# Patient Record
Sex: Female | Born: 1945 | Race: Black or African American | Hispanic: No | Smoking: Never smoker
Health system: Southern US, Community
[De-identification: ages and names within clinical notes are randomized; demographics above are authoritative.]

## PROBLEM LIST (undated history)

## (undated) DIAGNOSIS — E785 Hyperlipidemia, unspecified: Secondary | ICD-10-CM

## (undated) DIAGNOSIS — M1990 Unspecified osteoarthritis, unspecified site: Secondary | ICD-10-CM

## (undated) DIAGNOSIS — H269 Unspecified cataract: Secondary | ICD-10-CM

## (undated) DIAGNOSIS — I10 Essential (primary) hypertension: Secondary | ICD-10-CM

## (undated) DIAGNOSIS — C8300 Small cell B-cell lymphoma, unspecified site: Secondary | ICD-10-CM

## (undated) DIAGNOSIS — T7840XA Allergy, unspecified, initial encounter: Secondary | ICD-10-CM

## (undated) DIAGNOSIS — J189 Pneumonia, unspecified organism: Secondary | ICD-10-CM

## (undated) HISTORY — PX: OTHER SURGICAL HISTORY: SHX169

## (undated) HISTORY — DX: Unspecified osteoarthritis, unspecified site: M19.90

## (undated) HISTORY — PX: KNEE SURGERY: SHX244

## (undated) HISTORY — DX: Unspecified cataract: H26.9

## (undated) HISTORY — DX: Hyperlipidemia, unspecified: E78.5

## (undated) HISTORY — DX: Essential (primary) hypertension: I10

## (undated) HISTORY — DX: Allergy, unspecified, initial encounter: T78.40XA

## (undated) HISTORY — DX: Small cell B-cell lymphoma, unspecified site: C83.00

## (undated) HISTORY — PX: BREAST SURGERY: SHX581

## (undated) HISTORY — PX: ABDOMINAL HYSTERECTOMY: SHX81

## (undated) HISTORY — DX: Pneumonia, unspecified organism: J18.9

## (undated) HISTORY — PX: EYE SURGERY: SHX253

---

## 1999-04-25 ENCOUNTER — Ambulatory Visit (HOSPITAL_COMMUNITY): Admission: RE | Admit: 1999-04-25 | Discharge: 1999-04-25 | Payer: Self-pay | Admitting: Obstetrics and Gynecology

## 1999-04-25 ENCOUNTER — Encounter (INDEPENDENT_AMBULATORY_CARE_PROVIDER_SITE_OTHER): Payer: Self-pay | Admitting: Specialist

## 1999-08-28 ENCOUNTER — Encounter (INDEPENDENT_AMBULATORY_CARE_PROVIDER_SITE_OTHER): Payer: Self-pay

## 1999-08-28 ENCOUNTER — Inpatient Hospital Stay (HOSPITAL_COMMUNITY): Admission: RE | Admit: 1999-08-28 | Discharge: 1999-08-30 | Payer: Self-pay | Admitting: Gynecology

## 2002-01-08 ENCOUNTER — Other Ambulatory Visit: Admission: RE | Admit: 2002-01-08 | Discharge: 2002-01-08 | Payer: Self-pay | Admitting: *Deleted

## 2002-01-30 ENCOUNTER — Encounter: Admission: RE | Admit: 2002-01-30 | Discharge: 2002-01-30 | Payer: Self-pay | Admitting: *Deleted

## 2002-01-30 ENCOUNTER — Encounter: Payer: Self-pay | Admitting: *Deleted

## 2002-02-18 ENCOUNTER — Encounter: Admission: RE | Admit: 2002-02-18 | Discharge: 2002-02-18 | Payer: Self-pay | Admitting: *Deleted

## 2002-02-18 ENCOUNTER — Encounter: Payer: Self-pay | Admitting: *Deleted

## 2004-01-14 ENCOUNTER — Encounter: Admission: RE | Admit: 2004-01-14 | Discharge: 2004-01-14 | Payer: Self-pay | Admitting: Family Medicine

## 2005-01-19 ENCOUNTER — Ambulatory Visit: Payer: Self-pay | Admitting: Family Medicine

## 2005-02-06 ENCOUNTER — Encounter (INDEPENDENT_AMBULATORY_CARE_PROVIDER_SITE_OTHER): Payer: Self-pay | Admitting: Internal Medicine

## 2005-02-19 ENCOUNTER — Ambulatory Visit: Payer: Self-pay | Admitting: Family Medicine

## 2005-02-19 ENCOUNTER — Other Ambulatory Visit: Admission: RE | Admit: 2005-02-19 | Discharge: 2005-02-19 | Payer: Self-pay | Admitting: Family Medicine

## 2005-03-09 ENCOUNTER — Ambulatory Visit: Payer: Self-pay | Admitting: Family Medicine

## 2005-05-22 ENCOUNTER — Ambulatory Visit: Payer: Self-pay | Admitting: Family Medicine

## 2006-12-11 ENCOUNTER — Ambulatory Visit: Payer: Self-pay | Admitting: Family Medicine

## 2006-12-11 DIAGNOSIS — S139XXA Sprain of joints and ligaments of unspecified parts of neck, initial encounter: Secondary | ICD-10-CM | POA: Insufficient documentation

## 2006-12-11 DIAGNOSIS — R030 Elevated blood-pressure reading, without diagnosis of hypertension: Secondary | ICD-10-CM

## 2007-07-10 HISTORY — PX: COLONOSCOPY: SHX174

## 2008-02-11 ENCOUNTER — Ambulatory Visit: Payer: Self-pay | Admitting: Family Medicine

## 2008-02-11 DIAGNOSIS — M79609 Pain in unspecified limb: Secondary | ICD-10-CM | POA: Insufficient documentation

## 2008-02-11 DIAGNOSIS — R635 Abnormal weight gain: Secondary | ICD-10-CM | POA: Insufficient documentation

## 2008-02-17 ENCOUNTER — Encounter: Admission: RE | Admit: 2008-02-17 | Discharge: 2008-02-17 | Payer: Self-pay | Admitting: Family Medicine

## 2008-02-19 ENCOUNTER — Encounter (INDEPENDENT_AMBULATORY_CARE_PROVIDER_SITE_OTHER): Payer: Self-pay | Admitting: Internal Medicine

## 2008-02-19 ENCOUNTER — Encounter (INDEPENDENT_AMBULATORY_CARE_PROVIDER_SITE_OTHER): Payer: Self-pay | Admitting: *Deleted

## 2008-02-23 ENCOUNTER — Ambulatory Visit: Payer: Self-pay | Admitting: Family Medicine

## 2008-02-24 LAB — CONVERTED CEMR LAB
ALT: 17 units/L (ref 0–35)
AST: 16 units/L (ref 0–37)
Albumin: 3.6 g/dL (ref 3.5–5.2)
Alkaline Phosphatase: 69 units/L (ref 39–117)
BUN: 20 mg/dL (ref 6–23)
Basophils Absolute: 0 10*3/uL (ref 0.0–0.1)
Basophils Relative: 0.3 % (ref 0.0–3.0)
Bilirubin, Direct: 0.2 mg/dL (ref 0.0–0.3)
CO2: 29 meq/L (ref 19–32)
Calcium: 9.2 mg/dL (ref 8.4–10.5)
Chloride: 111 meq/L (ref 96–112)
Cholesterol: 232 mg/dL (ref 0–200)
Creatinine, Ser: 1 mg/dL (ref 0.4–1.2)
Direct LDL: 144.9 mg/dL
Eosinophils Absolute: 0.2 10*3/uL (ref 0.0–0.7)
Eosinophils Relative: 2.8 % (ref 0.0–5.0)
GFR calc Af Amer: 72 mL/min
GFR calc non Af Amer: 60 mL/min
Glucose, Bld: 104 mg/dL — ABNORMAL HIGH (ref 70–99)
HCT: 38.3 % (ref 36.0–46.0)
HDL: 35.5 mg/dL — ABNORMAL LOW (ref >39.0)
Hemoglobin: 12.9 g/dL (ref 12.0–15.0)
Lymphocytes Relative: 43.8 % (ref 12.0–46.0)
MCHC: 33.8 g/dL (ref 30.0–36.0)
MCV: 83.7 fL (ref 78.0–100.0)
Monocytes Absolute: 0.5 10*3/uL (ref 0.1–1.0)
Monocytes Relative: 8.3 % (ref 3.0–12.0)
Neutro Abs: 2.4 10*3/uL (ref 1.4–7.7)
Neutrophils Relative %: 44.8 % (ref 43.0–77.0)
Platelets: 343 10*3/uL (ref 150–400)
Potassium: 4.2 meq/L (ref 3.5–5.1)
RBC: 4.57 M/uL (ref 3.87–5.11)
RDW: 12.7 % (ref 11.5–14.6)
Sodium: 141 meq/L (ref 135–145)
TSH: 1.43 microintl units/mL (ref 0.35–5.50)
Total Bilirubin: 0.4 mg/dL (ref 0.3–1.2)
Total CHOL/HDL Ratio: 6.5
Total Protein: 6.8 g/dL (ref 6.0–8.3)
Triglycerides: 249 mg/dL (ref 0–149)
VLDL: 50 mg/dL — ABNORMAL HIGH (ref 0–40)
WBC: 5.5 10*3/uL (ref 4.5–10.5)

## 2008-02-26 ENCOUNTER — Ambulatory Visit: Payer: Self-pay | Admitting: Family Medicine

## 2008-02-26 DIAGNOSIS — E785 Hyperlipidemia, unspecified: Secondary | ICD-10-CM | POA: Insufficient documentation

## 2008-02-26 LAB — CONVERTED CEMR LAB: OCCULT 1: NEGATIVE

## 2008-02-27 ENCOUNTER — Encounter (INDEPENDENT_AMBULATORY_CARE_PROVIDER_SITE_OTHER): Payer: Self-pay | Admitting: *Deleted

## 2008-03-03 ENCOUNTER — Encounter (INDEPENDENT_AMBULATORY_CARE_PROVIDER_SITE_OTHER): Payer: Self-pay | Admitting: Internal Medicine

## 2008-03-03 DIAGNOSIS — Z8709 Personal history of other diseases of the respiratory system: Secondary | ICD-10-CM | POA: Insufficient documentation

## 2008-03-22 ENCOUNTER — Ambulatory Visit: Payer: Self-pay | Admitting: Gastroenterology

## 2008-04-02 ENCOUNTER — Ambulatory Visit: Payer: Self-pay | Admitting: Gastroenterology

## 2008-04-02 ENCOUNTER — Encounter (INDEPENDENT_AMBULATORY_CARE_PROVIDER_SITE_OTHER): Payer: Self-pay | Admitting: Internal Medicine

## 2008-04-02 LAB — HM COLONOSCOPY: HM Colonoscopy: NORMAL

## 2008-04-23 ENCOUNTER — Encounter (INDEPENDENT_AMBULATORY_CARE_PROVIDER_SITE_OTHER): Payer: Self-pay | Admitting: Internal Medicine

## 2008-04-28 ENCOUNTER — Ambulatory Visit: Payer: Self-pay | Admitting: Family Medicine

## 2008-04-28 DIAGNOSIS — I10 Essential (primary) hypertension: Secondary | ICD-10-CM

## 2008-05-05 ENCOUNTER — Telehealth: Payer: Self-pay | Admitting: Family Medicine

## 2008-06-10 ENCOUNTER — Ambulatory Visit: Payer: Self-pay | Admitting: Family Medicine

## 2008-07-22 ENCOUNTER — Ambulatory Visit: Payer: Self-pay | Admitting: Family Medicine

## 2008-09-02 ENCOUNTER — Ambulatory Visit: Payer: Self-pay | Admitting: Family Medicine

## 2008-09-02 DIAGNOSIS — K648 Other hemorrhoids: Secondary | ICD-10-CM

## 2008-10-11 ENCOUNTER — Ambulatory Visit: Payer: Self-pay | Admitting: Family Medicine

## 2008-10-11 DIAGNOSIS — M25569 Pain in unspecified knee: Secondary | ICD-10-CM | POA: Insufficient documentation

## 2008-10-11 DIAGNOSIS — M1710 Unilateral primary osteoarthritis, unspecified knee: Secondary | ICD-10-CM

## 2008-12-03 ENCOUNTER — Ambulatory Visit: Payer: Self-pay | Admitting: Family Medicine

## 2008-12-03 ENCOUNTER — Encounter (INDEPENDENT_AMBULATORY_CARE_PROVIDER_SITE_OTHER): Payer: Self-pay | Admitting: *Deleted

## 2008-12-03 ENCOUNTER — Encounter: Admission: RE | Admit: 2008-12-03 | Discharge: 2008-12-03 | Payer: Self-pay | Admitting: Family Medicine

## 2009-01-04 ENCOUNTER — Ambulatory Visit: Payer: Self-pay | Admitting: Family Medicine

## 2009-01-04 DIAGNOSIS — IMO0002 Reserved for concepts with insufficient information to code with codable children: Secondary | ICD-10-CM | POA: Insufficient documentation

## 2009-03-03 ENCOUNTER — Ambulatory Visit: Payer: Self-pay | Admitting: Family Medicine

## 2009-03-07 LAB — CONVERTED CEMR LAB
AST: 16 units/L (ref 0–37)
Calcium: 9.2 mg/dL (ref 8.4–10.5)
GFR calc non Af Amer: 64.64 mL/min (ref >60)
Glucose, Bld: 91 mg/dL (ref 70–99)
Sodium: 140 meq/L (ref 135–145)
Total CHOL/HDL Ratio: 6
VLDL: 34.8 mg/dL (ref 0.0–40.0)

## 2009-03-16 ENCOUNTER — Encounter: Admission: RE | Admit: 2009-03-16 | Discharge: 2009-03-16 | Payer: Self-pay | Admitting: Family Medicine

## 2009-03-16 LAB — HM MAMMOGRAPHY

## 2009-03-17 ENCOUNTER — Ambulatory Visit: Payer: Self-pay | Admitting: Family Medicine

## 2009-03-18 ENCOUNTER — Encounter (INDEPENDENT_AMBULATORY_CARE_PROVIDER_SITE_OTHER): Payer: Self-pay | Admitting: *Deleted

## 2010-09-03 ENCOUNTER — Emergency Department (HOSPITAL_COMMUNITY): Payer: BC Managed Care – PPO

## 2010-09-03 ENCOUNTER — Emergency Department (HOSPITAL_COMMUNITY)
Admission: EM | Admit: 2010-09-03 | Discharge: 2010-09-04 | Disposition: A | Discharge status: Home / Self Care | Payer: BC Managed Care – PPO | Attending: Emergency Medicine | Admitting: Emergency Medicine

## 2010-09-03 DIAGNOSIS — I10 Essential (primary) hypertension: Secondary | ICD-10-CM | POA: Insufficient documentation

## 2010-09-03 DIAGNOSIS — Y929 Unspecified place or not applicable: Secondary | ICD-10-CM | POA: Insufficient documentation

## 2010-09-03 DIAGNOSIS — R109 Unspecified abdominal pain: Secondary | ICD-10-CM | POA: Insufficient documentation

## 2010-09-03 DIAGNOSIS — T148XXA Other injury of unspecified body region, initial encounter: Secondary | ICD-10-CM | POA: Insufficient documentation

## 2010-09-03 DIAGNOSIS — X500XXA Overexertion from strenuous movement or load, initial encounter: Secondary | ICD-10-CM | POA: Insufficient documentation

## 2010-09-03 DIAGNOSIS — M25559 Pain in unspecified hip: Secondary | ICD-10-CM | POA: Insufficient documentation

## 2010-09-03 DIAGNOSIS — M79609 Pain in unspecified limb: Secondary | ICD-10-CM | POA: Insufficient documentation

## 2010-09-04 ENCOUNTER — Telehealth: Payer: Self-pay | Admitting: Family Medicine

## 2010-09-04 LAB — URINALYSIS, ROUTINE W REFLEX MICROSCOPIC
Hgb urine dipstick: NEGATIVE
Protein, ur: NEGATIVE mg/dL
Urobilinogen, UA: 0.2 mg/dL (ref 0.0–1.0)

## 2010-09-04 LAB — URINE MICROSCOPIC-ADD ON

## 2010-09-06 ENCOUNTER — Ambulatory Visit (INDEPENDENT_AMBULATORY_CARE_PROVIDER_SITE_OTHER)
Admission: RE | Admit: 2010-09-06 | Discharge: 2010-09-06 | Disposition: A | Discharge status: Home / Self Care | Payer: BC Managed Care – PPO | Source: Ambulatory Visit | Attending: Family Medicine | Admitting: Family Medicine

## 2010-09-06 ENCOUNTER — Encounter: Payer: Self-pay | Admitting: Family Medicine

## 2010-09-06 ENCOUNTER — Ambulatory Visit (INDEPENDENT_AMBULATORY_CARE_PROVIDER_SITE_OTHER): Payer: BC Managed Care – PPO | Admitting: Family Medicine

## 2010-09-06 ENCOUNTER — Other Ambulatory Visit: Payer: Self-pay | Admitting: Family Medicine

## 2010-09-06 DIAGNOSIS — IMO0002 Reserved for concepts with insufficient information to code with codable children: Secondary | ICD-10-CM

## 2010-09-06 DIAGNOSIS — M25559 Pain in unspecified hip: Secondary | ICD-10-CM

## 2010-09-06 DIAGNOSIS — M545 Low back pain, unspecified: Secondary | ICD-10-CM | POA: Insufficient documentation

## 2010-09-07 ENCOUNTER — Telehealth: Payer: Self-pay | Admitting: Family Medicine

## 2010-09-08 ENCOUNTER — Telehealth: Payer: Self-pay | Admitting: Family Medicine

## 2010-09-14 NOTE — Progress Notes (Signed)
Summary: pt is constipated  Phone Note Call from Patient Call back at Home Phone [phone] P PH     Caller: Patient Summary of Call: Pt states she has not had a BM in 4 days.  She thinks this is caused by the oxycodone she was given at ER on sunday night.  She is asking what she can try using.  Advised miralax, otc stool softener.  Please advise. Initial call taken by: Lowella Petties CMA, AAMA,  September 07, 2010 11:01 AM  Follow-up for Phone Call        agree  corrected EMR (pt still trying to determine PCP, wants a female doctor) Follow-up by: Hannah Beat MD,  September 07, 2010 11:22 AM  Additional Follow-up for Phone Call Additional follow up Details #1::        [facility] advising pt.              Lowella Petties CMA, AAMA  September 07, 2010 11:28 AM

## 2010-09-14 NOTE — Assessment & Plan Note (Signed)
Summary: hip pain/alc   Vital Signs:  Patient profile:   65 year old female Height:      61.5 inches Temp:     98.7 degrees F oral Pulse rate:   76 / minute Pulse rhythm:   regular BP sitting:   150 / 90  (left arm) Cuff size:   large  Vitals Entered By: Linde Gillis CMA Duncan Dull) (September 06, 2010 12:43 PM) CC: right hip pain   History of Present Illness: Had some mild pain that started about a week ago or so. Got better for al ittle bit.  On Sunday, went to the hospital.   Radiates down leg.  65 year old female:  s/p ov to the ER on Sunday, plain films of the R hip, reviewed, no evidence of occult fracture and no evidence of significant hip OA.  primarily c/o buttock and lateral hip pain without groin pain radiating down leg to the LE.  Tingling sensation, no bowel or bladder incontinence.  occ mild back pain.   Allergies (verified): No Known Drug Allergies  Past History:  Past medical, surgical, family and social histories (including risk factors) reviewed, and no changes noted (except as noted below).  Past Medical History: Reviewed history from 01/04/2009 and no changes required. Pneumonia, hx of OA, R knee  Past Surgical History: Reviewed history from 04/23/2008 and no changes required. partial hyst; fibroids,-- 2001 R) breast biopsy; benign --1999 colonoscopy--9/09--neg  Family History: Reviewed history and no changes required.  Social History: Reviewed history from 02/11/2008 and no changes required. 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 Otherwise the pertinent positives of the ROS are noted above.    Physical Exam  General:  GEN: Well-developed,well-nourished,in no acute distress; alert,appropriate and cooperative throughout examination HEENT: Normocephalic and atraumatic without obvious abnormalities. No apparent alopecia or balding. Ears, externally no deformities PULM: Breathing comfortably in no respiratory distress EXT: No clubbing, cyanosis, or edema PSYCH: Normally interactive. Cooperative during the interview. Pleasant. Friendly and conversant. Not anxious or depressed appearing. Normal, full affect.  Msk:  HIP EXAM: SIDE: R ROM: Abduction, Flexion, Internal and External range of motion: full Pain with terminal IROM and EROM: no GTB: NT SLR: NEG Knees: No effusion FABER: NT REVERSE FABER: NT, neg Piriformis: NT at direct palpation Str: flexion: 3/5 abduction: 5/5 adduction: 5/5 Strength testing tender ttp posteior hip abductors and buttocks   Impression & Recommendations:  Problem # 1:  HIP PAIN, RIGHT (ICD-719.45) Assessment New Not true hip pain c/w lumbar pathology, radiculopathy  ROM, water aerobics as tol steroid burst and taper  c/w valium and tramadol as needed   refill lipid and HTN meds.  Her updated medication list for this problem includes:    Meloxicam 15 Mg Tabs (Meloxicam) ..... One by mouth daily as needed    Tramadol Hcl 50 Mg Tabs (Tramadol hcl) .Marland Kitchen... 1 by mouth daily as needed  Orders: T-Lumbar Spine Complete, 5 Views (71110TC)  Problem # 2:  BACK PAIN WITH RADICULOPATHY (ICD-729.2) Assessment: New  Complete Medication List: 1)  Norvasc 5 Mg Tabs (Amlodipine besylate) .Marland Kitchen.. 1 once daily for bp 2)  Omega 3  3)  Glucosamine-chondroitin Caps (Glucosamine-chondroit-vit c-mn) .... Once a day 4)  Meloxicam 15 Mg Tabs (Meloxicam) .... One by mouth daily as needed 5)  Tramadol Hcl 50 Mg Tabs (Tramadol hcl) .Marland Kitchen.. 1 by mouth daily as needed 6)  Simvastatin 40 Mg Tabs  (Simvastatin) .... Take 1 daily for cholesterol 7)  Prednisone 10 Mg Tabs (Prednisone) .... 4 tabs by mouth for 6 days, then 3 tabs by mouth for 4 days, then 2 tabs by mouth for 4 days, then 1 tab by mouth for 4 days  Patient Instructions: 1)  f/u 3- 4 weeks Prescriptions: PREDNISONE 10 MG  TABS (PREDNISONE) 4 tabs by mouth for 6 days, then 3 tabs by mouth for 4 days, then 2 tabs by mouth for 4 days, then 1 tab by mouth for 4 days  #48 x 0   Entered and Authorized by:   Hannah Beat MD   Signed by:   Hannah Beat MD on 09/06/2010   Method used:   Electronically to        [facility] [id number] (retail)       [address] [place], [place]  [address]       Ph: [phone]       Fax: [phone]   RxID:   [id number] SIMVASTATIN 40 MG TABS (SIMVASTATIN) take 1 daily for cholesterol  #30 x 3   Entered and Authorized by:   Hannah Beat MD   Signed by:   Hannah Beat MD on 09/06/2010   Method used:   Electronically to        [facility] [address] (retail)       [address] [place], [place]  [address]       Ph: [phone]       Fax: [phone]   RxID:   [id number] NORVASC 5 MG TABS (AMLODIPINE BESYLATE) 1 once daily for BP  #30 x 3   Entered and Authorized by:   Hannah Beat MD   Signed by:   Hannah Beat MD on 09/06/2010   Method used:   Electronically to        [facility] [address] (retail)       [address] [place], [place]  [address]       Ph: [phone]       Fax: [phone]   RxID:   [id number]    Orders Added: 1)  T-Lumbar Spine Complete, 5 Views [71110TC] 2)  Est. Patient Level IV [51761]    Current Allergies (reviewed today): No known allergies

## 2010-09-19 NOTE — Progress Notes (Signed)
Summary: call a nurse   Phone Note Call from Patient   Caller: Patient Summary of Call: Triage Record Num: [id number] Operator: Joneen Boers Patient Name: Erica Carlson Call Date & Time: 09/03/2010 10:04:15PM Patient Phone: [phone] PCP: Patient Gender: Female PCP Fax : Patient DOB: 1946-01-21 Practice Name: [facility] Reason for Call: 1 week intermittent R hip/leg pain 9/10. Acetaminophen not helping. Seems to be worse today. Hip Non-Injury/unbearable pain/see ED immediately. Pt going to [facility]. Protocol(s) Used: Hip Non-Injury Recommended Outcome per Protocol: See ED Immediately Reason for Outcome: Unbearable pain Care Advice:  ~ Protect the patient from falling or other harm.  ~ Do not give the patient anything to eat or drink.  ~ DO NOT attempt to place any weight on the affected extremity until evaluated by provider. Write down provider's name. List or place the following in a bag for transport with the patient: current prescription and/or nonprescription medications; alternative treatments, therapies and medications; and street drugs.  ~  ~ Support part in position of comfort to reduce pain and swelling; avoid unnecessary movement. 09/03/2010 10:22:51PM Page 1 of 1 [email or web address] Initial call taken by: Melody Comas,  September 04, 2010 9:21 AM  Follow-up for Phone Call        please get me the ER records and call patient [id number].  thanks. Crawford Givens MD  September 04, 2010 9:23 AM   Records printed, in your in box.  No answer at patient's home number.  Work number disconnected.  Lugene Fuquay CMA Duncan Dull)  September 04, 2010 10:43 AM   Tried to phone patient again, no answer, no A/M. Lugene Fuquay CMA Duncan Dull)  September 04, 2010 4:45 PM   Tried to phone patient again, no answer, no A/M Delilah Shan CMA Duncan Dull)  September 05, 2010 10:28 AM   Patient states she has not been here since BDB retired and would like to be set up with a female physician.  The  call was transferred to [facility] to get her an appointment appropriately. Follow-up by: Delilah Shan CMA Duncan Dull),  September 05, 2010 11:49 AM  Additional Follow-up for Phone Call Additional follow up Details #1::        noted.  thanks. Crawford Givens MD  September 05, 2010 1:33 PM     Additional Follow-up for Phone Call Additional follow up Details #2::    Pt. wanted to transfer to Dr.Camil Wilhelmsen and when I told her Dr.Flynn Lininger isn't seeing [facility] patients and I could get her in w/ Dr.Aron she said she didn't know Dr.Aron and wasn't sure if she wanted to transfer to her.  She'll be seeing Dr.Copland tomorrow about her hip. Follow-up by: Beau Fanny,  September 05, 2010 2:28 PM

## 2010-09-19 NOTE — Progress Notes (Signed)
Summary: call a nurse   Phone Note Call from Patient   Caller: Patient Call For: Judith Part MD Summary of Call: Triage Record Num: [id number] Operator: April Finney Patient Name: Shuntae Herzig Call Date & Time: 09/07/2010 9:37:21PM Patient Phone: [phone] PCP: Hannah Beat Patient Gender: Female PCP Fax : [phone] Patient DOB: 24-Aug-1945 Practice Name: [facility] Reason for Call: Shelma calling about no bm since 09/02/10. Has increased fluids and taking benefiber. Denies abd pain. Afebrile. No vomiting. Feels like she needs to have a bm. No bleeding. No emergent symptoms. See in 72 hrs care advice given. Protocol(s) Used: Constipation Recommended Outcome per Protocol: See Provider within 72 Hours Reason for Outcome: Pain or feeling of pressure with bowel movement AND not previously evaluated or not responding to provider recommended treatment Care Advice:  ~ Call provider if symptoms worsen or new symptoms develop.  ~ Decrease intake of binding foods such as cheese, white rice, meats, and bananas.  ~ Try a warm bath or apply a warm, wet towel to rectal area to help relax the muscles in the area. Rectal pain or change in bowel habits including recurrent episodes or persistent constipation or constipation alternating with diarrhea, can be the only sign of a serious problem, such as polyps or a tumor. An evaluation by provider must be scheduled.  ~  ~ SYMPTOM / CONDITION MANAGEMENT  ~ CAUTIONS Constipation Care Measures: - Drink 8 to 10 glasses of liquid per day, more if breastfeeding. - Drink warm water or coffee early in the morning. - Gradually increase dietary fiber (fresh fruits/vegetables, whole grain bread and cereals). - As tolerated, walk 30 minutes at a steady pace daily. - Consider nonprescription stool softeners (Colace) per label, pharmacist or provider recommendations. Stool softners are not habit forming as some stimulant laxatives may  become. - Consider nonprescription bulk forming laxatives (such as Metamucil, FiberCon, Citrucel, etc.); follow package directions. - Avoid routine use of strong laxatives/enemas/suppos Initial call taken by: Melody Comas,  September 08, 2010 10:16 AM  Follow-up for Phone Call        can we call and see how Ms. Strblin is doing?  if still no BM next week, needs to be seen. Follow-up by: Eustaquio Boyden  MD,  September 08, 2010 10:23 AM  Additional Follow-up for Phone Call Additional follow up Details #1::        I am not sure if this has been called to ask the question yet -- i just saw her and she has a close f/u with me soon for MSK issue.   i would have her increase miralax dosing to two times a day, then three times a day, then 4 times daily, etc. until BM's commence. they will. guaranteed. Additional Follow-up by: Hannah Beat MD,  September 10, 2010 7:01 AM    Additional Follow-up for Phone Call Additional follow up Details #2::    Patient has had a bowel movement now. Patient said she had some pain pills but, she is out of (she got these from the hospital oxycodone5- 325mg    diazipam 5mg   Follow-up by: Benny Lennert CMA Duncan Dull),  September 11, 2010 9:16 AM  Additional Follow-up for Phone Call Additional follow up Details #3:: Details for Additional Follow-up Action Taken: ok to call in vicodin 5-500, 1-2 by mouth q 6 hours as needed pain. #40, 0 refills Additional Follow-up by: Hannah Beat MD,  September 11, 2010 9:33 AM  Patient advised and rx called in

## 2010-09-22 ENCOUNTER — Telehealth (INDEPENDENT_AMBULATORY_CARE_PROVIDER_SITE_OTHER): Payer: Self-pay | Admitting: *Deleted

## 2010-10-05 NOTE — Progress Notes (Signed)
Summary: wants vicodin refill   Phone Note Call from Patient Call back at Home Phone [phone]   Caller: Patient Call For: Judith Part MD Summary of Call: Dr. Patsy Lager perscribed vicodin  5-500, 1-2 by mouth q 6 hours as needed pain (see CALL A NURSE from 09-08-10) She says that she only has 4 left and is asking fi she could get one more refill. She is still having alot of him and back pain, she understands that it will take a while to get over, but needs something to help with the pain until then. [facility] [address]  Initial call taken by: Melody Comas,  September 22, 2010 3:18 PM  Follow-up for Phone Call        No urgent, narcotic.Marland Kitchen await Dr. Cyndie Chime return.  Follow-up by: Kerby Nora MD,  September 22, 2010 5:10 PM  Additional Follow-up for Phone Call Additional follow up Details #1::        That is reasonable.  Vicodin 5-500, 1 by mouth q 6 hours as needed pain. #40, 0 refills.  Use sparingly for bad pain, f/u if not improved in a few weeks Additional Follow-up by: Hannah Beat MD,  September 24, 2010 4:49 PM    Additional Follow-up for Phone Call Additional follow up Details #2::    RX CALLED TO PHARMACY AND PATIENT ADVISED.Consuello Masse CMA   Follow-up by: Benny Lennert CMA Duncan Dull),  September 25, 2010 7:33 AM  New/Updated Medications: VICODIN 5-500 MG TABS (HYDROCODONE-ACETAMINOPHEN) TAKE ONE EVERY 6 HOURS AS NEEDED FOR PAIN Prescriptions: VICODIN 5-500 MG TABS (HYDROCODONE-ACETAMINOPHEN) TAKE ONE EVERY 6 HOURS AS NEEDED FOR PAIN  #40 x 0   Entered by:   Benny Lennert CMA (AAMA)   Authorized by:   Hannah Beat MD   Signed by:   Benny Lennert CMA (AAMA) on 09/25/2010   Method used:   Telephoned to ...       [facility] [address] (retail)       [address] [place], [place]  [address]       Ph: [phone]       Fax: [phone]   RxID:   [id number]

## 2010-10-23 ENCOUNTER — Encounter: Payer: Self-pay | Admitting: Family Medicine

## 2010-10-24 ENCOUNTER — Encounter: Payer: Self-pay | Admitting: Family Medicine

## 2010-10-24 ENCOUNTER — Ambulatory Visit (INDEPENDENT_AMBULATORY_CARE_PROVIDER_SITE_OTHER): Payer: BC Managed Care – PPO | Admitting: Family Medicine

## 2010-10-24 VITALS — BP 160/100 | HR 93 | Temp 98.5°F | Ht 61.5 in | Wt 177.8 lb

## 2010-10-24 DIAGNOSIS — IMO0002 Reserved for concepts with insufficient information to code with codable children: Secondary | ICD-10-CM

## 2010-10-24 MED ORDER — PREDNISONE 20 MG PO TABS: ORAL_TABLET | ORAL | Status: DC

## 2010-10-24 MED ORDER — OXYCODONE-ACETAMINOPHEN 5-325 MG PO TABS: 1.0000 | ORAL_TABLET | ORAL | Status: DC | PRN

## 2010-10-24 NOTE — Patient Instructions (Signed)
Use percocet for pain. Start  Repeat course of prednsione taper. Heat on back. See Shirlee Limerick on the way out of office for referral to back specialist.

## 2010-10-24 NOTE — Progress Notes (Signed)
  Subjective:    Patient ID: Erica Carlson, female    DOB: [birth year], 65 y.o.   MRN: [id number]  HPI  Seen for back pain with radiculopathy.. 08/2010 by Dr. Patsy Lager. Back film nml. Hip film nml.  Treated with : ROM, water aerobics as tol  Steroid burst and taper  c/w valium and tramadol as needed   She noted 80% improvement in symptoms with steroids.  Pt reports today that 5 days ago sudden recurrence of pain in right buttock radiating to right front leg. 9/10 on pain scale.  In wheelchair in office. Cannot put weight on right leg. Keeping her up at night. Mild weakness in right leg, no numbness. Hydrocodone not helping.  20 years ago fell and hit back on cement.. No known fracture or past back surgery.    Review of Systems  Constitutional: Negative for fever and fatigue.  HENT: Negative for ear pain.   Eyes: Negative for pain.  Respiratory: Negative for chest tightness and shortness of breath.   Cardiovascular: Negative for chest pain, palpitations and leg swelling.  Gastrointestinal: Negative for abdominal pain.  Genitourinary: Negative for difficulty urinating.       Objective:   Physical Exam  Constitutional: Vital signs are normal. She appears well-developed and well-nourished.       Seated in wheelchair.. Moved to table slow due to pain in back and leg  HENT:  Head: Normocephalic.  Neck: Trachea normal and normal range of motion. Neck supple. Carotid bruit is not present. No mass and no thyromegaly present.  Cardiovascular: Normal rate, regular rhythm, normal heart sounds and normal pulses.  PMI is not displaced.  Exam reveals no gallop, no distant heart sounds and no friction rub.   No murmur heard. Pulmonary/Chest: Effort normal and breath sounds normal. She has no decreased breath sounds. She has no wheezes. She has no rhonchi. She has no rales.  Musculoskeletal:       Lumbar back: She exhibits tenderness, pain and spasm. She exhibits no edema and no  deformity.       Positive right SLR, neg FAber's           Assessment & Plan:

## 2010-10-26 NOTE — Assessment & Plan Note (Signed)
Recurrence of pain. Reviewed X-ray with pt in detail: degenerative disc changes, no fracture.  Pt has no red flags that suggest need for surgical referral.   Will repeat course of steroids as she had significant improvement with this in past. Percocet for pain. Heat over low back. Refer to PM and R. for likely epidural injections.

## 2010-10-30 ENCOUNTER — Telehealth: Payer: Self-pay | Admitting: *Deleted

## 2010-10-30 NOTE — Telephone Encounter (Signed)
In addition to colace and prune juice I would recommend miralax over the counter 1 dose as directed every day Can also try an enema such as fleets otc  Keep me updated  If severe abd pain or any vomiting at any time- go to er I understand she cannot stop pain med at this time

## 2010-10-30 NOTE — Telephone Encounter (Signed)
Triage Record Num: [id number] Operator: April Finney Patient Name: Erica Carlson Call Date & Time: 10/29/2010 9:24:58AM Patient Phone: [phone] PCP: Audrie Gallus. Tower Patient Gender: Female PCP Fax : Patient DOB: 04/19/1946 Practice Name: [facility] Reason for Call: [name] calling about constipation. No BM since 10/26/10.Taking Oxycodone and steriods for back pain. Has tried Colace and prune juice. No rectal pain. Mild intermittent abd cramping. Afebrile. No emergent symptoms. See in 72 hrs care advice given. Protocol(s) Used: Constipation Recommended Outcome per Protocol: See Provider within 72 Hours Reason for Outcome: Constipation began after starting new prescription, nonprescribed or alternative medicine/therapy Care Advice: ~ Call provider if symptoms worsen or new symptoms develop. Rectal pain or change in bowel habits including recurrent episodes or persistent constipation or constipation alternating with diarrhea, can be the only sign of a serious problem, such as polyps or a tumor. An evaluation by provider must be scheduled. ~ Speak with provider during regular office hours to review medication(s). Many medications (iron supplements, antidepressants, diuretics, antacids containing calcium) can contribute to constipation. ~ ~ SYMPTOM / CONDITION MANAGEMENT ~ CAUTIONS Medication Advice: - Discontinue all nonprescription and alternative medications, especially stimulants, until evaluated by provider. - Take prescribed medications as directed, following label instructions for the medication. - Do not change medications or dosing regimen until provider is consulted. - Know possible side effects of medication and what to do if they occur. - Tell provider all prescription, nonprescription or alternative medications that you take ~ Constipation Care Measures: - Drink 8 to 10 glasses of liquid per day, more if breastfeeding. - Drink warm water or coffee early in the  morning. - Gradually increase dietary fiber (fresh fruits/vegetables, whole grain bread and cereals). - As tolerated, walk 30 minutes at a steady pace daily. - Consider nonprescription stool softeners (Colace) per label, pharmacist or provider recommendations. Stool softners are not habit forming as some stimulant laxatives may become. - Consider nonprescription bulk forming laxatives (such as Metamucil, FiberCon, Citrucel, etc.); follow package directions. - Avoid routine use of strong laxatives/enemas/suppositories unless ordered by provider. - Do not delay having bowel movement when having urge. - Keep a routine; attempt bowel movement within half hour after a meal or after some exercise. ~ 10/29/2010 9:41:03AM Page 1 of 1 [email or web address]_[email or web address]_[email or web address]

## 2010-10-30 NOTE — Telephone Encounter (Signed)
Advised pt's daughter.

## 2010-10-30 NOTE — Telephone Encounter (Signed)
Daughter called, says that mom has not had BM in several days. She is in extreme pain, daughter is asking what she should do or if there is something that she can take to help her go to the bathroom.

## 2010-11-01 ENCOUNTER — Other Ambulatory Visit: Payer: Self-pay | Admitting: *Deleted

## 2010-11-01 MED ORDER — OXYCODONE-ACETAMINOPHEN 5-325 MG PO TABS: 1.0000 | ORAL_TABLET | ORAL | Status: AC | PRN

## 2010-11-01 NOTE — Telephone Encounter (Signed)
Daughter notified 

## 2010-11-01 NOTE — Telephone Encounter (Signed)
She got 30 on the 17th  Ask her when she started this med as opposed to the vicodin - I am confused due to new computer system Ask what pain she is taking it for  Ask her how many she is needing per day Thanks  Let her know I am just now getting to my messages due to seeing patients all day- I hear they were upset-thanks

## 2010-11-01 NOTE — Telephone Encounter (Signed)
Pt has been taking this med since she saw Dr Ermalene Searing 10/24/10.Pt said it is for low back pain. Pain had stopped 2 or 3 days ago and it started again last night. Pt usually takes 6 pills in 24 hr period taking 1 pill every 4 hours. Pt will wait to hear if she can get refilled.

## 2010-11-01 NOTE — Telephone Encounter (Signed)
I will go ahead and refil another 30  This is very strong and habit forming -so use caution If back pain is that bad again -should probably follow up with Dr Patsy Lager Px printed for pick up in IN box

## 2010-12-08 ENCOUNTER — Telehealth: Payer: Self-pay | Admitting: Family Medicine

## 2010-12-08 DIAGNOSIS — Z0000 Encounter for general adult medical examination without abnormal findings: Secondary | ICD-10-CM | POA: Insufficient documentation

## 2010-12-08 DIAGNOSIS — I10 Essential (primary) hypertension: Secondary | ICD-10-CM

## 2010-12-08 DIAGNOSIS — E785 Hyperlipidemia, unspecified: Secondary | ICD-10-CM

## 2010-12-08 NOTE — Telephone Encounter (Signed)
Message copied by Judy Pimple on Fri Dec 08, 2010 12:58 PM ------      Message from: Erica Carlson      Created: Fri Dec 08, 2010 12:09 PM      Regarding: Cpx labs Mon       Please order  future cpx labs for pt's upcomming lab appt.      Thanks      Rodney Booze

## 2010-12-11 ENCOUNTER — Other Ambulatory Visit (INDEPENDENT_AMBULATORY_CARE_PROVIDER_SITE_OTHER): Payer: BC Managed Care – PPO | Admitting: Family Medicine

## 2010-12-11 DIAGNOSIS — I10 Essential (primary) hypertension: Secondary | ICD-10-CM

## 2010-12-11 DIAGNOSIS — Z0000 Encounter for general adult medical examination without abnormal findings: Secondary | ICD-10-CM

## 2010-12-11 DIAGNOSIS — E785 Hyperlipidemia, unspecified: Secondary | ICD-10-CM

## 2010-12-11 LAB — CBC WITH DIFFERENTIAL/PLATELET
Basophils Relative: 0.4 % (ref 0.0–3.0)
Eosinophils Relative: 2.2 % (ref 0.0–5.0)
Hemoglobin: 11.9 g/dL — ABNORMAL LOW (ref 12.0–15.0)
Lymphocytes Relative: 31.8 % (ref 12.0–46.0)
Monocytes Relative: 7 % (ref 3.0–12.0)
Neutro Abs: 3.6 10*3/uL (ref 1.4–7.7)
RBC: 4.32 Mil/uL (ref 3.87–5.11)

## 2010-12-11 LAB — COMPREHENSIVE METABOLIC PANEL
ALT: 12 U/L (ref 0–35)
BUN: 14 mg/dL (ref 6–23)
CO2: 27 mEq/L (ref 19–32)
Calcium: 9.4 mg/dL (ref 8.4–10.5)
Chloride: 107 mEq/L (ref 96–112)
Creatinine, Ser: 0.8 mg/dL (ref 0.4–1.2)
GFR: 90.21 mL/min (ref >60.00)
Glucose, Bld: 94 mg/dL (ref 70–99)

## 2010-12-11 LAB — LIPID PANEL
Cholesterol: 288 mg/dL — ABNORMAL HIGH (ref 0–200)
Triglycerides: 98 mg/dL (ref 0.0–149.0)

## 2010-12-11 LAB — LDL CHOLESTEROL, DIRECT: Direct LDL: 217.7 mg/dL

## 2010-12-15 ENCOUNTER — Encounter: Payer: Self-pay | Admitting: Family Medicine

## 2010-12-15 ENCOUNTER — Ambulatory Visit (INDEPENDENT_AMBULATORY_CARE_PROVIDER_SITE_OTHER): Payer: BC Managed Care – PPO | Admitting: Family Medicine

## 2010-12-15 DIAGNOSIS — Z1231 Encounter for screening mammogram for malignant neoplasm of breast: Secondary | ICD-10-CM

## 2010-12-15 DIAGNOSIS — Z8709 Personal history of other diseases of the respiratory system: Secondary | ICD-10-CM

## 2010-12-15 DIAGNOSIS — I10 Essential (primary) hypertension: Secondary | ICD-10-CM

## 2010-12-15 DIAGNOSIS — E785 Hyperlipidemia, unspecified: Secondary | ICD-10-CM

## 2010-12-15 DIAGNOSIS — Z1211 Encounter for screening for malignant neoplasm of colon: Secondary | ICD-10-CM

## 2010-12-15 DIAGNOSIS — Z0000 Encounter for general adult medical examination without abnormal findings: Secondary | ICD-10-CM

## 2010-12-15 DIAGNOSIS — Z23 Encounter for immunization: Secondary | ICD-10-CM

## 2010-12-15 DIAGNOSIS — D649 Anemia, unspecified: Secondary | ICD-10-CM

## 2010-12-15 MED ORDER — AMLODIPINE BESYLATE 5 MG PO TABS: 5.0000 mg | ORAL_TABLET | Freq: Every day | ORAL | Status: DC

## 2010-12-15 NOTE — Assessment & Plan Note (Signed)
Given ifob- esp in light of slt anemia  Is utd on colonosc

## 2010-12-15 NOTE — Progress Notes (Signed)
Subjective:    Patient ID: Erica Carlson, female    DOB: [birth year], 65 y.o.   MRN: [id number]  HPI Here for annual health mt exam and to disc chronic med problems   hyst in past - partial  Had fibroid tumors  No abn paps or current problems   Mam 9/10 - missed it this sept , missed it due to illness  Self exam - no lumps or problems   Zoster status never had -- or vaccine  Not interested in vaccine  Hx of pneumonia - does want to get that  Td8/10  Was in bed 3 months this year for severe degenerative disc dz- currently getting cortisone shots Is doing much better   Nl colonosc9/09- needs re check 10 y  Lipids - on simvastatin 40 --has run out of that  Does eat a low sat fat diet - no fried foods  Also watches starch Lab Results  Component Value Date   CHOL 288* 12/11/2010   CHOL 291* 03/03/2009   CHOL 232* 02/23/2008   Lab Results  Component Value Date   HDL 72.40 12/11/2010   HDL 47.50 03/03/2009   HDL 40.9* 02/23/2008   No results found for this basename: LDLCALC   Lab Results  Component Value Date   TRIG 98.0 12/11/2010   TRIG 174.0* 03/03/2009   TRIG 249* 02/23/2008   Lab Results  Component Value Date   CHOLHDL 4 12/11/2010   CHOLHDL 6 03/03/2009   CHOLHDL 6.5 CALC 02/23/2008   Lab Results  Component Value Date   LDLDIRECT 217.7 12/11/2010   LDLDIRECT 204.1 03/03/2009   LDLDIRECT 144.9 02/23/2008   Also on amlodipine 5 mg HTN -- bp is 150/84- this is not normal for her  Usually 130s/70s  Was high when she was in pain and on steroids   Wt is up 10 lb  - because she could not move for a while  bmi 34- obese   fam hx  gmother CVA Mother - fine  Sister - breast cancer   On current labs 11.9 hb  Has been anemic in past ? Why Runs in family also No symptoms No bleeding/ abd pain / blood in stool  Patient Active Problem List  Diagnoses  . HYPERLIPIDEMIA  . HYPERTENSION  . HEMORRHOIDS  . OSTEOARTHRITIS, KNEE, RIGHT  . KNEE PAIN, RIGHT  . ANSERINE  BURSITIS, RIGHT  . HEEL PAIN, RIGHT  . WEIGHT GAIN  . CERVICAL MUSCLE STRAIN  . PNEUMONIA, HX OF  . HIP PAIN, RIGHT  . BACK PAIN WITH RADICULOPATHY  . Routine general medical examination at a health care facility  . Anemia, mild  . Special screening for malignant neoplasms, colon  . Other screening mammogram   Past Medical History  Diagnosis Date  . Pneumonia   . Arthritis     osteo arthritis of right knee   Past Surgical History  Procedure Date  . Abdominal hysterectomy   . Breast surgery    History  Substance Use Topics  . Smoking status: Never Smoker   . Smokeless tobacco: Not on file  . Alcohol Use: Not on file   Family History  Problem Relation Age of Onset  . Breast cancer Sister    No Known Allergies Current Outpatient Prescriptions on File Prior to Visit  Medication Sig Dispense Refill  . fish oil-omega-3 fatty acids 1000 MG capsule Take 2 g by mouth daily.        Marland Kitchen glucosamine-chondroitin 500-400 MG tablet  Take 1 tablet by mouth daily.               Review of Systems Review of Systems  Constitutional: Negative for fever, appetite change, fatigue and unexpected weight change.  Eyes: Negative for pain and visual disturbance.  Respiratory: Negative for cough and shortness of breath.   Cardiovascular: Negative for cp or palpitations or edema .   Gastrointestinal: Negative for nausea, diarrhea and constipation. or blood in stool  Genitourinary: Negative for urgency and frequency.  Skin: Negative for pallor. or rash  Neurological: Negative for weakness, light-headedness, numbness and headaches.  Hematological: Negative for adenopathy. Does not bruise/bleed easily.  Psychiatric/Behavioral: Negative for dysphoric mood. The patient is not nervous/anxious.          Objective:   Physical Exam  Constitutional: She appears well-developed and well-nourished. No distress.  HENT:  Head: Normocephalic and atraumatic.  Right Ear: External ear normal.  Left  Ear: External ear normal.  Nose: Nose normal.  Mouth/Throat: Oropharynx is clear and moist.  Eyes: Conjunctivae and EOM are normal. Pupils are equal, round, and reactive to light.  Neck: Normal range of motion. Neck supple. No JVD present. Carotid bruit is not present. Erythema present. No thyromegaly present.  Cardiovascular: Normal rate, regular rhythm, normal heart sounds and intact distal pulses.   Pulmonary/Chest: Effort normal and breath sounds normal. No respiratory distress. She has no wheezes.  Abdominal: Soft. Bowel sounds are normal. She exhibits no distension, no abdominal bruit and no mass. There is no tenderness.  Genitourinary: No breast swelling, tenderness, discharge or bleeding.  Musculoskeletal: Normal range of motion. She exhibits no edema and no tenderness.  Lymphadenopathy:    She has no cervical adenopathy.  Neurological: She is alert. She has normal reflexes. No cranial nerve deficit. Coordination normal.  Skin: Skin is warm and dry. No rash noted. No erythema. No pallor.  Psychiatric: She has a normal mood and affect.          Assessment & Plan:

## 2010-12-15 NOTE — Assessment & Plan Note (Signed)
Reviewed health habits including diet and exercise and skin cancer prevention Also reviewed health mt list, fam hx and immunizations  Needs to start safe exercise program  Wellness labs rev

## 2010-12-15 NOTE — Assessment & Plan Note (Signed)
Chol very high off med despite good diet Rev low sat fat diet  Will need to switch from zocor to another agent due to poss interaction with amlodipine She will call with covered alternatives

## 2010-12-15 NOTE — Assessment & Plan Note (Signed)
Per pt this runs in family Not symptomatic  Balanced diet  Given stool card colonosc up to date

## 2010-12-15 NOTE — Assessment & Plan Note (Signed)
bp high today- but has been ok at home Will sched 2 wk nurse visit bp check and bring cuff to calibrate Then will make plan

## 2010-12-15 NOTE — Patient Instructions (Addendum)
Please do stool card for colon cancer screening  Pneumonia vaccine today We will schedule mammogram at check out  Schedule nurse visit in 2 weeks for blood pressure check (bring your cuff with you to compare)  Call your insurance company and find out what statin cholesterol medicines are covered (I want to stop the simvastatin due to possible interaction with your amlodipine)  lipitor would be my first choice  Avoid red meat/ fried foods/ egg yolks/ fatty breakfast meats/ butter, cheese and high fat dairy/ and shellfish   Ask Dr Ethelene Hal about exercise

## 2010-12-15 NOTE — Assessment & Plan Note (Signed)
Pneumovax today 

## 2010-12-15 NOTE — Assessment & Plan Note (Signed)
Nl breast exam fam hx ca  Disc imp of self exams Mam sched

## 2010-12-25 ENCOUNTER — Ambulatory Visit
Admission: RE | Admit: 2010-12-25 | Discharge: 2010-12-25 | Disposition: A | Discharge status: Home / Self Care | Payer: BC Managed Care – PPO | Source: Ambulatory Visit | Attending: Family Medicine | Admitting: Family Medicine

## 2010-12-25 DIAGNOSIS — Z1231 Encounter for screening mammogram for malignant neoplasm of breast: Secondary | ICD-10-CM

## 2010-12-29 ENCOUNTER — Ambulatory Visit (INDEPENDENT_AMBULATORY_CARE_PROVIDER_SITE_OTHER): Payer: BC Managed Care – PPO | Admitting: Family Medicine

## 2010-12-29 VITALS — BP 160/80

## 2010-12-29 DIAGNOSIS — I10 Essential (primary) hypertension: Secondary | ICD-10-CM

## 2010-12-29 NOTE — Progress Notes (Signed)
  Subjective:    Patient ID: Erica Carlson, female    DOB: 19-Feb-1946, 65 y.o.   MRN: [id number]  HPI Comments: Patient came in this morning for a bp check and to have her bp home monitor. Our cuff said 160/80 and her bp cuff said that 160/89 is this okay or does she need a new machine. Please advise.     Review of Systems     Objective:   Physical Exam        Assessment & Plan:

## 2010-12-30 MED ORDER — AMLODIPINE BESYLATE 10 MG PO TABS: 10.0000 mg | ORAL_TABLET | Freq: Every day | ORAL | Status: DC

## 2010-12-30 NOTE — Progress Notes (Signed)
Has bp been running high at home last 2 weeks?  If so, would recommend increase amlodipine to 10mg  daily (2 pills daily).  New dose for 10mg  amlodipine will be sent to pharmacy for when runs out of 5mg 's.  Will route to Dr. Karie Schwalbe in case she wants any other plan. Monitor ankle swelling on increased dose. Ok to continue using her cuff, seems accurate.

## 2011-01-01 NOTE — Progress Notes (Signed)
  Subjective:    Patient ID: Erica Carlson, female    DOB: [birth year], 65 y.o.   MRN: [id number]  HPI Comments: Patient advised as instructed     Review of Systems     Objective:   Physical Exam        Assessment & Plan:

## 2011-01-02 NOTE — Progress Notes (Signed)
That sounds good to me  Have her check bp at home and update [place] of results on the new dose

## 2011-01-11 ENCOUNTER — Other Ambulatory Visit: Payer: Self-pay | Admitting: Family Medicine

## 2011-01-11 ENCOUNTER — Encounter: Payer: Self-pay | Admitting: *Deleted

## 2011-01-11 ENCOUNTER — Other Ambulatory Visit: Payer: BC Managed Care – PPO

## 2011-01-11 DIAGNOSIS — Z1211 Encounter for screening for malignant neoplasm of colon: Secondary | ICD-10-CM

## 2011-01-30 NOTE — Progress Notes (Signed)
Pt said she feels like she is doing good. Pt said BP averages 130/70.

## 2011-05-02 ENCOUNTER — Ambulatory Visit (INDEPENDENT_AMBULATORY_CARE_PROVIDER_SITE_OTHER): Payer: BC Managed Care – PPO | Admitting: Family Medicine

## 2011-05-02 ENCOUNTER — Encounter: Payer: Self-pay | Admitting: Family Medicine

## 2011-05-02 VITALS — BP 166/90 | HR 76 | Temp 98.0°F | Ht 61.5 in | Wt 186.2 lb

## 2011-05-02 DIAGNOSIS — IMO0002 Reserved for concepts with insufficient information to code with codable children: Secondary | ICD-10-CM

## 2011-05-02 DIAGNOSIS — M25569 Pain in unspecified knee: Secondary | ICD-10-CM

## 2011-05-02 NOTE — Progress Notes (Signed)
Subjective:    Patient ID: Erica Carlson, female    DOB: 1946/07/09, 65 y.o.   MRN: [id number]  HPI Here for back pain (known deg disk dz in LS), and also R knee pain (has oa )  Sees Dr Ethelene Hal -- for her back -- had some injections -- 3 of them for severe pain  Now the injections are wearing off - and on hydrocodone 2 times per day  Cannot get another inj until dec  Has not mentioned surgeon  Some bulging  Last MRI was in may  Most of the pain is coming in the lower leg   Has also had inj in knee for oa  They really helped -- has been a while  Now is hurting worse   Now the pain is almost exclusively in R knee and R lower leg -- cannot tell anymore if it is coming from her back or her knee  Patient Active Problem List  Diagnoses  . HYPERLIPIDEMIA  . HYPERTENSION  . HEMORRHOIDS  . OSTEOARTHRITIS, KNEE, RIGHT  . KNEE PAIN, RIGHT  . ANSERINE BURSITIS, RIGHT  . HEEL PAIN, RIGHT  . WEIGHT GAIN  . CERVICAL MUSCLE STRAIN  . PNEUMONIA, HX OF  . HIP PAIN, RIGHT  . BACK PAIN WITH RADICULOPATHY  . Routine general medical examination at a health care facility  . Anemia, mild  . Special screening for malignant neoplasms, colon  . Other screening mammogram   Past Medical History  Diagnosis Date  . Pneumonia   . Arthritis     osteo arthritis of right knee   Past Surgical History  Procedure Date  . Abdominal hysterectomy   . Breast surgery    History  Substance Use Topics  . Smoking status: Never Smoker   . Smokeless tobacco: Not on file  . Alcohol Use: Not on file   Family History  Problem Relation Age of Onset  . Breast cancer Sister    No Known Allergies Current Outpatient Prescriptions on File Prior to Visit  Medication Sig Dispense Refill  . amLODipine (NORVASC) 10 MG tablet Take 1 tablet (10 mg total) by mouth daily.  30 tablet  6  . fish oil-omega-3 fatty acids 1000 MG capsule Take 1 g by mouth daily.       Marland Kitchen glucosamine-chondroitin 500-400 MG tablet Take 1  tablet by mouth daily.        . Hydrocodone-APAP-Dietary Prod (HYDROCODONE-APAP-NUTRIT SUPP) 10-325 MG MISC Take 1 tablet by mouth 2 (two) times daily as needed.            Review of Systems Review of Systems  Constitutional: Negative for fever, appetite change, fatigue and unexpected weight change.  Eyes: Negative for pain and visual disturbance.  Respiratory: Negative for cough and shortness of breath.   Cardiovascular: Negative for cp or palpitations    Gastrointestinal: Negative for nausea, diarrhea and constipation.  Genitourinary: Negative for urgency and frequency.  Skin: Negative for pallor or rash   MSK pos for knee/leg and low back pain  Neurological: Negative for weakness, light-headedness, and headaches. pos for some tingling without numbness  Hematological: Negative for adenopathy. Does not bruise/bleed easily.  Psychiatric/Behavioral: Negative for dysphoric mood. The patient is not nervous/anxious.          Objective:   Physical Exam  Constitutional: She appears well-developed and well-nourished. No distress.  HENT:  Head: Normocephalic and atraumatic.  Eyes: Conjunctivae and EOM are normal. Pupils are equal, round, and reactive to light.  Neck: Normal range of motion. Neck supple. No JVD present. Carotid bruit is not present. No thyromegaly present.  Cardiovascular: Normal rate, regular rhythm and normal heart sounds.   Pulmonary/Chest: Effort normal and breath sounds normal. No respiratory distress. She has no wheezes.  Abdominal:       No suprapubic tenderness    Musculoskeletal: She exhibits tenderness. She exhibits no edema.       Tender lower lumbar spinous processes as well as R piriformis area  Nl rom hip LS -flex 30 deg/ ext 10 deg and pain on L lat bend SLR mildly pos on R   R knee- medial joint line tenderness/ some crepitice/ painful full flex and mcmurray Stable- lachman/ drawer No effusion Heavily favors L leg for walking   Lymphadenopathy:     She has no cervical adenopathy.  Neurological: She is alert. She has normal strength and normal reflexes. She displays no atrophy. No sensory deficit. She exhibits normal muscle tone.  Skin: Skin is warm and dry. No rash noted. No erythema. No pallor.  Psychiatric: She has a normal mood and affect.          Assessment & Plan:

## 2011-05-02 NOTE — Patient Instructions (Signed)
We will make appt with Dr Patsy Lager at check out for knee and leg pain  If knee treatment does not help the leg pain - you will have to see Dr Ethelene Hal again  Consider getting a cane for use on the R side

## 2011-05-06 NOTE — Assessment & Plan Note (Signed)
Causing some R leg pain and tingling (but I suspect much of her pain is probably from OA of knee) Will refer her to Dr Patsy Lager to address knee pain - and if not imp , go back to Dr Ethelene Hal to disc other opt for tx for degenerative disk dz lumbar

## 2011-05-06 NOTE — Assessment & Plan Note (Signed)
Pt has significant OA of R knee-- this may be causing at least part of her back pain Ref to Dr Patsy Lager- ? If would benefit from injection  Did recommend use of a cane- her gait is labored and causing more back problems

## 2011-05-08 ENCOUNTER — Encounter: Payer: Self-pay | Admitting: Family Medicine

## 2011-05-08 ENCOUNTER — Ambulatory Visit (INDEPENDENT_AMBULATORY_CARE_PROVIDER_SITE_OTHER): Payer: BC Managed Care – PPO | Admitting: Family Medicine

## 2011-05-08 DIAGNOSIS — IMO0002 Reserved for concepts with insufficient information to code with codable children: Secondary | ICD-10-CM

## 2011-05-08 DIAGNOSIS — M25569 Pain in unspecified knee: Secondary | ICD-10-CM

## 2011-05-08 DIAGNOSIS — M1710 Unilateral primary osteoarthritis, unspecified knee: Secondary | ICD-10-CM

## 2011-05-08 MED ORDER — DICLOFENAC SODIUM 75 MG PO TBEC: 75.0000 mg | DELAYED_RELEASE_TABLET | Freq: Two times a day (BID) | ORAL | Status: DC

## 2011-05-08 NOTE — Progress Notes (Signed)
  Subjective:    Patient ID: Erica Carlson, female    DOB: 05-Sep-1945, 65 y.o.   MRN: [id number]  HPI  Erica Carlson, a 65 y.o. female presents today in the office for the following:    Right knee: pain in and around her knee. Pleasant female who I remember well with known significant R OA. Hurting 4 or 5 times a day. Dull ache around her knee the most. No recent or new injury  Also back pain with known disk bulges. Has seen Dr. Ethelene Hal, last ESI in 12/2010. Helped quite a bit, return on some buttocks and R radiculopathy over the last few weeks.  Worst pain right now is in her knee and down.   The PMH, PSH, Social History, Family History, Medications, and allergies have been reviewed in [facility], and have been updated if relevant.   Review of Systems REVIEW OF SYSTEMS  GEN: No fevers, chills. Nontoxic. Primarily MSK c/o today. MSK: Detailed in the HPI GI: tolerating PO intake without difficulty Neuro: No numbness, parasthesias, or tingling associated. Otherwise the pertinent positives of the ROS are noted above.      Objective:   Physical Exam   Physical Exam  Blood pressure 140/84, pulse 75, temperature 97.8 F (36.6 C), temperature source Oral, height 5' 1.5" (1.562 m), weight 187 lb 12.8 oz (85.186 kg), SpO2 100.00%.  GEN: Well-developed,well-nourished,in no acute distress; alert,appropriate and cooperative throughout examination HEENT: Normocephalic and atraumatic without obvious abnormalities. Ears, externally no deformities PULM: Breathing comfortably in no respiratory distress EXT: No clubbing, cyanosis, or edema PSYCH: Normally interactive. Cooperative during the interview. Pleasant. Friendly and conversant. Not anxious or depressed appearing. Normal, full affect.  Gait: antalgic gait ROM: 0-115 Effusion: neg Echymosis or edema: none Patellar tendon NT Painful PLICA: neg Patellar grind: mild pos Medial and lateral patellar facet loading: minimal pos medial and  lateral joint lines: mild TTP medial joint line Mcmurray's pain only Flexion-pinch mild pain Varus and valgus stress: stable Lachman: neg Ant and Post drawer: neg Hip abduction, IR, ER: WNL Hip flexion str: 5/5 Hip abd: 5/5 Quad: 5/5 VMO atrophy: mild Hamstring concentric and eccentric: 5/5  Hip ROM excellent     Assessment & Plan:   1. OSTEOARTHRITIS, KNEE, RIGHT  diclofenac (VOLTAREN) 75 MG EC tablet  2. KNEE PAIN, RIGHT  diclofenac (VOLTAREN) 75 MG EC tablet  3. BACK PAIN WITH RADICULOPATHY  diclofenac (VOLTAREN) 75 MG EC tablet    I suspect combination of knee and back with true pathology at both.  We can try to calm her knee down acutely, change to voltaren, cont pain meds and inject knee. She is supposed to f/u with Dr. Ethelene Hal in 06/2011 for her back, which is reasonable  Knee Injection, R Patient verbally consented to procedure. Risks, benefits, and alternatives explained. Sterilely prepped with Chloraprep. Ethyl cholride used for anesthesia. 9 cc Lidocaine 1% mixed with 1 cc of Depo-Medrol 40 mg injected using the anteromedial approach without difficulty. No complications with procedure and tolerated well. Patient had decreased pain post-injection.

## 2011-05-08 NOTE — Patient Instructions (Signed)
After you have an injection of any joint, the numbing medicine will make it feel better for a few hours. Later tonight, it is common for the joint to feel worse. The steroid will take 48-72 hours to start working - it is the thing that will likely provide the most relief.  Ice the joint where you had the injection at least 2-3 times a day for 20 minutes for 3 days. If have had swelling, pain in the joint itself, ice for 1 week.  You can use an ice bag, frozen peas or corn, an ice pack - all work  

## 2011-06-06 ENCOUNTER — Ambulatory Visit (INDEPENDENT_AMBULATORY_CARE_PROVIDER_SITE_OTHER): Payer: BC Managed Care – PPO | Admitting: Family Medicine

## 2011-06-06 ENCOUNTER — Encounter: Payer: Self-pay | Admitting: Family Medicine

## 2011-06-06 DIAGNOSIS — M1710 Unilateral primary osteoarthritis, unspecified knee: Secondary | ICD-10-CM

## 2011-06-06 NOTE — Progress Notes (Signed)
  Patient Name: Erica Carlson Date of Birth: Nov 27, 1945 Age: 65 y.o. Medical Record Number: [id number] Gender: female  History of Present Illness:  Erica Carlson is a 65 y.o. very pleasant female patient who presents with the following:  F/u R knee pain:  At least 50% better. Able to go up and down stairs with some pain. Not buckling. Able to sleep at night.  She is functionally doing considerably better. She still has some occasional radiculopathy down her leg. She has gotten some epidural steroid injections from Dr. ramus, and she is following up with him next month.  She is taking less pain medicine now she has some diclofenac available. We did an intra-articular knee injection about one month ago.  Past Medical History, Surgical History, Social History, Family History, and Problem List have been reviewed in EHR and updated if relevant.  Review of Systems:  GEN: No fevers, chills. Nontoxic. Primarily MSK c/o today. MSK: Detailed in the HPI GI: tolerating PO intake without difficulty Neuro: No numbness, parasthesias, or tingling associated. Otherwise the pertinent positives of the ROS are noted above.    Physical Examination: Filed Vitals:   06/06/11 1104  BP: 130/80  Pulse: 86  Temp: 99.1 F (37.3 C)  TempSrc: Oral  Height: 5' 1.5" (1.562 m)  Weight: 189 lb 12.8 oz (86.093 kg)  SpO2: 99%     GEN: WDWN, NAD, Non-toxic, Alert & Oriented x 3 HEENT: Atraumatic, Normocephalic.  Ears and Nose: No external deformity. EXTR: No clubbing/cyanosis/edema NEURO: Normal gait.  PSYCH: Normally interactive. Conversant. Not depressed or anxious appearing.  Calm demeanor.   Knee:  R Gait: Normal heel toe pattern ROM: 0-120 Effusion: neg Echymosis or edema: none Patellar tendon NT Painful PLICA: neg Patellar grind: negative Medial and lateral patellar facet loading: negative medial and lateral joint lines:NT Mcmurray's neg Flexion-pinch neg Varus and valgus stress:  stable Lachman: neg Ant and Post drawer: neg Hip abduction, IR, ER: WNL Hip flexion str: 5/5 Hip abd: 5/5 Quad: 5/5 VMO atrophy:No Hamstring concentric and eccentric: 5/5   Assessment and Plan: Knee osteoarthritis, improved. Continue with diclofenac and Tylenol when necessary. Reasonable use some Vicodin when necessary when the pain is at its worst. She is significantly better now. Followup when necessary

## 2011-07-05 ENCOUNTER — Telehealth: Payer: Self-pay | Admitting: Internal Medicine

## 2011-07-05 NOTE — Telephone Encounter (Signed)
I called pt.  She is feeling well and doesn't have a fever.  The area is a small patch on her back with reported 'bumps' containing fluid that doesn't look like pus.  It is not getting worse, and may be some better over the last few days with peroxide and neosporin.  I told her to continue with that, see [facility] as scheduled, let us know if progression/spread, fever, other concerns.  She agrees.  Routed to [employer] as a FYI.

## 2011-07-05 NOTE — Telephone Encounter (Signed)
Patient stated she received a steroid injection in her back and 3 days later a bump came up and she scratched it and it started spending and she has been peroxide and neosporin she says it doesn't itch and it's not painful.  I made an appt. For on Monday with Dr. Milinda Antis but she wanted to know what can she use to stop the spreading until her appointment time.

## 2011-07-06 NOTE — Telephone Encounter (Signed)
Patient notified as instructed by telephone. 

## 2011-07-06 NOTE — Telephone Encounter (Signed)
Patient notified as instructed by telephone.Pt said she has not called the office that gave her the injection but she will. Pt said bumps are still there but they are not hurting. Pt said she is doing OK and will see Dr Milinda Antis on [place] as planned.

## 2011-07-06 NOTE — Telephone Encounter (Signed)
Ok - update [place] if worse in meantime

## 2011-07-06 NOTE — Telephone Encounter (Signed)
Please check in with her -- make sure she has let the doctor/ practice who did the injection know  Is it painful? -- I have heard of steroid injections activating shingles (rare, but can happen) thanks

## 2011-07-09 ENCOUNTER — Ambulatory Visit (INDEPENDENT_AMBULATORY_CARE_PROVIDER_SITE_OTHER): Payer: BC Managed Care – PPO | Admitting: Family Medicine

## 2011-07-09 ENCOUNTER — Encounter: Payer: Self-pay | Admitting: Family Medicine

## 2011-07-09 VITALS — BP 146/84 | HR 84 | Temp 98.2°F | Wt 187.0 lb

## 2011-07-09 DIAGNOSIS — B029 Zoster without complications: Secondary | ICD-10-CM

## 2011-07-09 DIAGNOSIS — IMO0002 Reserved for concepts with insufficient information to code with codable children: Secondary | ICD-10-CM

## 2011-07-09 MED ORDER — MUPIROCIN 2 % EX OINT: TOPICAL_OINTMENT | Freq: Two times a day (BID) | CUTANEOUS | Status: AC

## 2011-07-09 NOTE — Assessment & Plan Note (Signed)
Very mild case on L buttock - not much discomfort and small area  Is over 16 days old  Some excoriation from use of peroxide Goal is to prev 2ndary infx tx with bactroban and stop peroxide-see avs  Will update if this worsens  Did begin shortly after epidural inj --? steroid

## 2011-07-09 NOTE — Progress Notes (Signed)
Subjective:    Patient ID: Erica Carlson, female    DOB: [birth year], 65 y.o.   MRN: [id number]  HPI Has rash on her buttock  Started as a little bump -- then started to spread ? Impetigo Used peroxide and neosporin A little irritation  Never had shingles in the past  No pain   No fever  Feels ok   Had epidural injection for back -- in [place] -- 2 weeks ago - Dr Ethelene Hal  This shot was more midline -- and did not help as much as shot before it  ? inj with steroid or pain med  No bleeding or other problems   Patient Active Problem List  Diagnoses  . HYPERLIPIDEMIA  . HYPERTENSION  . HEMORRHOIDS  . OSTEOARTHRITIS, KNEE, RIGHT  . KNEE PAIN, RIGHT  . ANSERINE BURSITIS, RIGHT  . HEEL PAIN, RIGHT  . WEIGHT GAIN  . CERVICAL MUSCLE STRAIN  . PNEUMONIA, HX OF  . HIP PAIN, RIGHT  . BACK PAIN WITH RADICULOPATHY  . Routine general medical examination at a health care facility  . Anemia, mild  . Special screening for malignant neoplasms, colon  . Other screening mammogram  . Zoster   Past Medical History  Diagnosis Date  . Pneumonia   . Arthritis     osteo arthritis of right knee   Past Surgical History  Procedure Date  . Abdominal hysterectomy   . Breast surgery    History  Substance Use Topics  . Smoking status: Never Smoker   . Smokeless tobacco: Never Used  . Alcohol Use: No   Family History  Problem Relation Age of Onset  . Breast cancer Sister    No Known Allergies Current Outpatient Prescriptions on File Prior to Visit  Medication Sig Dispense Refill  . amLODipine (NORVASC) 10 MG tablet Take 1 tablet (10 mg total) by mouth daily.  30 tablet  6  . diclofenac (VOLTAREN) 75 MG EC tablet Take 1 tablet (75 mg total) by mouth 2 (two) times daily.  60 tablet  3  . HYDROcodone-acetaminophen (NORCO) 10-325 MG per tablet Take 1 tablet by mouth every 6 (six) hours as needed.        . fish oil-omega-3 fatty acids 1000 MG capsule Take 1 g by mouth daily.       Marland Kitchen  glucosamine-chondroitin 500-400 MG tablet Take 1 tablet by mouth daily.               Review of Systems Review of Systems  Constitutional: Negative for fever, appetite change, fatigue and unexpected weight change.  Eyes: Negative for pain and visual disturbance.  Respiratory: Negative for cough and shortness of breath.   Cardiovascular: Negative for cp or palpitations    Gastrointestinal: Negative for nausea, diarrhea and constipation.  Genitourinary: Negative for urgency and frequency.  Skin: Negative for pallor , pos for rash with some itching and burning   Neurological: Negative for weakness, light-headedness, numbness and headaches.  Hematological: Negative for adenopathy. Does not bruise/bleed easily.  Psychiatric/Behavioral: Negative for dysphoric mood. The patient is not nervous/anxious.          Objective:   Physical Exam  Constitutional: She appears well-developed and well-nourished. No distress.       overwt and well appearing   HENT:  Head: Normocephalic and atraumatic.  Mouth/Throat: Oropharynx is clear and moist.  Eyes: Conjunctivae and EOM are normal. Pupils are equal, round, and reactive to light. Right eye exhibits no discharge. Left eye exhibits  no discharge.  Neck: Normal range of motion. Neck supple. No thyromegaly present.  Cardiovascular: Normal rate, regular rhythm, normal heart sounds and intact distal pulses.   Pulmonary/Chest: Effort normal and breath sounds normal. No respiratory distress. She has no wheezes.  Abdominal: Soft.  Musculoskeletal: She exhibits tenderness. She exhibits no edema.       Generalized LS tenderness  Lymphadenopathy:    She has no cervical adenopathy.  Neurological: She is alert. She has normal reflexes. No cranial nerve deficit. She exhibits normal muscle tone.  Skin: Skin is warm. Rash noted. No pallor.       L buttock/ lower - 2-3 cm area of clustered vesicles that are drying and somewhat excoriated Little to no redness or  tenderness   Psychiatric: She has a normal mood and affect.          Assessment & Plan:

## 2011-07-09 NOTE — Patient Instructions (Signed)
I think you have shingles -- is mild  Clean with anti bacterial soap and water -- avoid peroxide  Use bactroban ointment twice daily with loose gauze to keep away infection and speed healing  If pain worsens let me know Follow up with Dr Ethelene Hal to discuss long term plan for your back pain  If the rash becomes painful let me know

## 2011-07-09 NOTE — Assessment & Plan Note (Signed)
2nd epidural inj with Dr Ethelene Hal not helping yet She will f/u with him to disc long term plan for back pain

## 2011-07-12 ENCOUNTER — Telehealth: Payer: Self-pay | Admitting: Internal Medicine

## 2011-07-12 MED ORDER — GABAPENTIN 100 MG PO CAPS: 100.0000 mg | ORAL_CAPSULE | Freq: Every day | ORAL | Status: DC

## 2011-07-12 NOTE — Telephone Encounter (Signed)
Patient notified as instructed by telephone. 

## 2011-07-12 NOTE — Telephone Encounter (Signed)
Patient was seen by Dr. Milinda Antis on Monday and Dx with shingles.  She was informed to call back if her symptoms get worse.  She said  She is having pain around the left side of her waist where the shingles are and at lower abdomen.  Please advise.

## 2011-07-12 NOTE — Telephone Encounter (Signed)
I would have her try hydrocodone she has at home, will also start gabapentin at low dose.  Take 100mg  qhs, may increase to bid.  Watch for sedation on this med.  Will route to Dr. Karie Schwalbe as Lorain Childes.

## 2011-07-13 NOTE — Telephone Encounter (Signed)
Aware. thanks

## 2011-10-17 ENCOUNTER — Encounter: Payer: Self-pay | Admitting: Family Medicine

## 2011-10-17 ENCOUNTER — Ambulatory Visit (INDEPENDENT_AMBULATORY_CARE_PROVIDER_SITE_OTHER)
Admission: RE | Admit: 2011-10-17 | Discharge: 2011-10-17 | Disposition: A | Discharge status: Home / Self Care | Payer: BC Managed Care – PPO | Source: Ambulatory Visit | Attending: Family Medicine | Admitting: Family Medicine

## 2011-10-17 ENCOUNTER — Ambulatory Visit (INDEPENDENT_AMBULATORY_CARE_PROVIDER_SITE_OTHER): Payer: BC Managed Care – PPO | Admitting: Family Medicine

## 2011-10-17 VITALS — BP 140/80 | Temp 98.9°F | Ht 62.0 in | Wt 187.8 lb

## 2011-10-17 DIAGNOSIS — M1710 Unilateral primary osteoarthritis, unspecified knee: Secondary | ICD-10-CM

## 2011-10-17 DIAGNOSIS — IMO0002 Reserved for concepts with insufficient information to code with codable children: Secondary | ICD-10-CM

## 2011-10-17 MED ORDER — DICLOFENAC SODIUM 75 MG PO TBEC: 75.0000 mg | DELAYED_RELEASE_TABLET | Freq: Two times a day (BID) | ORAL | Status: DC

## 2011-10-17 MED ORDER — DICLOFENAC SODIUM 1 % TD GEL: 1.0000 "application " | Freq: Four times a day (QID) | TRANSDERMAL | Status: DC

## 2011-10-17 NOTE — Progress Notes (Signed)
Patient Name: Erica Carlson Date of Birth: [birth year] Age: 66 y.o. Medical Record Number: [id number] Gender: female Date of Encounter: 10/17/2011  History of Present Illness:  Erica Carlson is a 66 y.o. very pleasant female patient who presents with the following:  Ice, biofreeze  Very pleasant lady with a history of some significant arthritic changes in her right knee, and had some good success with oral Voltaren, but does not really have any significant relief taking some oral any inflammatories like ibuprofen or Aleve. She has had some good success with intra-articular knee injections of corticosteroids in the past as well.  Now she is having some pain, mostly with walking around and being more active. No giving way. No locking up.  Past Medical History, Surgical History, Social History, Family History, Problem List, Medications, and Allergies have been reviewed and updated if relevant.  Review of Systems:  GEN: No fevers, chills. Nontoxic. Primarily MSK c/o today. MSK: Detailed in the HPI GI: tolerating PO intake without difficulty Neuro: No numbness, parasthesias, or tingling associated. Otherwise the pertinent positives of the ROS are noted above.    Physical Examination: Filed Vitals:   10/17/11 1525  BP: 140/80  Temp: 98.9 F (37.2 C)  TempSrc: Oral  Height: 5\' 2"  (1.575 m)  Weight: 187 lb 12.8 oz (85.186 kg)  SpO2: 99%    Body mass index is 34.35 kg/(m^2).   GEN: WDWN, NAD, Non-toxic, Alert & Oriented x 3 HEENT: Atraumatic, Normocephalic.  Ears and Nose: No external deformity. EXTR: No clubbing/cyanosis/edema NEURO: Normal gait.  PSYCH: Normally interactive. Conversant. Not depressed or anxious appearing.  Calm demeanor.   Knee: Right mild effusion, extension to 0 and flexion to 120. Mild medial and lateral joint line tenderness. Mild tenderness with patellar compression. Some mild crepitus. Pain with flexion pinch maneuver. Stable anterior cruciate  ligament, MCL, LCL. Stable PCL.  Assessment and Plan: 1. OSTEOARTHRITIS, KNEE, RIGHT  DG Knee Bilateral Standing AP, DG Knee 1-2 Views Right, diclofenac (VOLTAREN) 75 MG EC tablet, diclofenac sodium (VOLTAREN) 1 % GEL    Arthritis flare. Good hyaluronic acid candidate if corticosteroid does not work in future  Knee Injection, R Patient verbally consented to procedure. Risks (including potential rare risk of infection), benefits, and alternatives explained. Sterilely prepped with Chloraprep. Ethyl cholride used for anesthesia. 9 cc Lidocaine 1% mixed with 1 cc of Depo-Medrol 40 mg injected using the anterolateral approach without difficulty. No complications with procedure and tolerated well. Patient had decreased pain post-injection.   Dg Knee 1-2 Views Right  10/17/2011  *RADIOLOGY REPORT*  Clinical Data: Knee pain  RIGHT KNEE - 1-2 VIEW  Comparison: 12/03/2008  Findings: Three views of the right knee submitted. Again noted three compartment degenerative changes.  There is spurring of medial and lateral femoral condyle.  Mild spurring of medial tibial plateau.  No significant joint effusion. No acute fracture or subluxation.  IMPRESSION: No acute fracture or subluxation.  Again noted three compartment degenerative changes.  Spurring of medial and lateral femoral condyle.  Original Report Authenticated By: Natasha Mead, M.D.   Dg Knee Bilateral Standing Ap  10/17/2011  *RADIOLOGY REPORT*  Clinical Data: Right knee pain.  Right knee osteoarthrosis.  BILATERAL KNEES STANDING - 1 VIEW  Comparison: None.  Findings: Bilateral medial compartment predominant osteoarthritis is present.  Marginal osteophytes are present bilaterally in the medial compartment.  Lateral compartment joint space is preserved. No destructive osseous lesions identified.  Mild varus deformity of the right knee.  IMPRESSION: Bilateral  right greater than left medial compartment mild to moderate osteoarthritis.  Original Report  Authenticated By: Andreas Newport, M.D.

## 2011-11-06 ENCOUNTER — Telehealth: Payer: Self-pay | Admitting: Family Medicine

## 2011-11-06 NOTE — Telephone Encounter (Signed)
Unable to contact patient but, left voice mail advising her dr copland wanted her to schedule appt with him tomorrow not go to urgent care.

## 2011-11-06 NOTE — Telephone Encounter (Signed)
Caller: Erica Carlson/Patient; PCP: Juleen China.; CB#: [phone];  Call regarding Pain in R. Knee Is Worse; Unable To Get Pain Medication Ordered d/t insurance (Medicare/BCBS)  has not approved it;  Chronic knee pain r/t arthritis.  Pain worsened 10/08/11.  Afebrile. Pain is aggravated by walking; pain relieved at night when off knee. Had injection in knee approx 2 wks ago.  Reports pain in 8/10.  Advised to see MD now for unbearable pain per Knee Non-Injury Guideline. No appts remain per Epic.  Contacted Jaime/office who confirmed no appt remain and approved sending to [facility].

## 2011-11-06 NOTE — Telephone Encounter (Signed)
Call  Please just have the patient f/u with me tomorrow It will waste her time to go to UC  Tomorrow any time - this is the kind of thing that could wait until next week  This is nonemergent  I am sending to multiple people to try to help get in touch with patient before she goes

## 2011-11-07 ENCOUNTER — Encounter: Payer: Self-pay | Admitting: Family Medicine

## 2011-11-07 ENCOUNTER — Ambulatory Visit (INDEPENDENT_AMBULATORY_CARE_PROVIDER_SITE_OTHER): Payer: BC Managed Care – PPO | Admitting: Family Medicine

## 2011-11-07 VITALS — BP 150/100 | HR 70 | Temp 98.1°F | Ht 62.0 in | Wt 186.8 lb

## 2011-11-07 DIAGNOSIS — M25561 Pain in right knee: Secondary | ICD-10-CM

## 2011-11-07 DIAGNOSIS — IMO0002 Reserved for concepts with insufficient information to code with codable children: Secondary | ICD-10-CM

## 2011-11-07 DIAGNOSIS — M25569 Pain in unspecified knee: Secondary | ICD-10-CM

## 2011-11-07 DIAGNOSIS — S83249A Other tear of medial meniscus, current injury, unspecified knee, initial encounter: Secondary | ICD-10-CM

## 2011-11-07 MED ORDER — TRAMADOL HCL 50 MG PO TABS: 50.0000 mg | ORAL_TABLET | Freq: Four times a day (QID) | ORAL | Status: AC | PRN

## 2011-11-07 MED ORDER — HYDROCODONE-ACETAMINOPHEN 5-500 MG PO TABS: 1.0000 | ORAL_TABLET | Freq: Four times a day (QID) | ORAL | Status: AC | PRN

## 2011-11-07 NOTE — Patient Instructions (Signed)
REFERRAL: GO THE THE FRONT ROOM AT THE ENTRANCE OF OUR CLINIC, NEAR CHECK IN. ASK FOR MARION. SHE WILL HELP YOU SET UP YOUR REFERRAL. DATE: TIME:  

## 2011-11-07 NOTE — Progress Notes (Addendum)
Patient Name: Erica Carlson Date of Birth: 01-Oct-1945 Age: 66 y.o. Medical Record Number: [id number] Gender: female Date of Encounter: 11/07/2011  History of Present Illness:  Erica Carlson is a 66 y.o. very pleasant female patient who presents with the following:  Knee inj not help.   Pleasant lady who I remember well who presented and I saw last year for some acute back pain. She ultimately did do well with that after having some epidural steroid injections, and she now is doing some chiropractic maintenance.  Have also been seeing her for about the last 6 months for right-sided knee pain. She does have some baseline tricompartmental arthritis, but this is not in end stage.  In the fall, I saw her and gave her right knee injection, and she did really well. She had more problems, came back last month, I reinjected her her, which has provided minimal relief.  Patient Active Problem List  Diagnoses  . HYPERLIPIDEMIA  . HYPERTENSION  . HEMORRHOIDS  . OSTEOARTHRITIS, KNEE, RIGHT  . KNEE PAIN, RIGHT  . ANSERINE BURSITIS, RIGHT  . HEEL PAIN, RIGHT  . WEIGHT GAIN  . CERVICAL MUSCLE STRAIN  . PNEUMONIA, HX OF  . HIP PAIN, RIGHT  . BACK PAIN WITH RADICULOPATHY  . Routine general medical examination at a health care facility  . Anemia, mild  . Special screening for malignant neoplasms, colon  . Other screening mammogram  . Zoster   Past Medical History  Diagnosis Date  . Pneumonia   . Arthritis     osteo arthritis of right knee   Past Surgical History  Procedure Date  . Abdominal hysterectomy   . Breast surgery    History  Substance Use Topics  . Smoking status: Never Smoker   . Smokeless tobacco: Never Used  . Alcohol Use: No   Family History  Problem Relation Age of Onset  . Breast cancer Sister    No Known Allergies Current Outpatient Prescriptions on File Prior to Visit  Medication Sig Dispense Refill  . amLODipine (NORVASC) 10 MG tablet Take 1 tablet (10  mg total) by mouth daily.  30 tablet  6  . diclofenac (VOLTAREN) 75 MG EC tablet Take 1 tablet (75 mg total) by mouth 2 (two) times daily.  60 tablet  3  . fish oil-omega-3 fatty acids 1000 MG capsule Take 1 g by mouth daily.          Past Medical History, Surgical History, Social History, Family History, Problem List, Medications, and Allergies have been reviewed and updated if relevant.  Review of Systems:  GEN: No fevers, chills. Nontoxic. Primarily MSK c/o today. MSK: Detailed in the HPI GI: tolerating PO intake without difficulty Neuro: No numbness, parasthesias, or tingling associated. Otherwise the pertinent positives of the ROS are noted above.    Physical Examination: Filed Vitals:   11/07/11 1020  BP: 150/100  Pulse: 70  Temp: 98.1 F (36.7 C)  TempSrc: Oral  Height: 5\' 2"  (1.575 m)  Weight: 186 lb 12.8 oz (84.732 kg)  SpO2: 97%    Body mass index is 34.17 kg/(m^2).   GEN: WDWN, NAD, Non-toxic, Alert & Oriented x 3 HEENT: Atraumatic, Normocephalic.  Ears and Nose: No external deformity. EXTR: No clubbing/cyanosis/edema NEURO: Normal gait.  PSYCH: Normally interactive. Conversant. Not depressed or anxious appearing.  Calm demeanor.   Knee:  R Gait: significant antalgia ROM: 0-115 Effusion: neg Echymosis or edema: none Patellar tendon NT Painful PLICA: neg Patellar grind: negative Medial  and lateral patellar facet loading: negative medial and lateral joint lines: medial > lateral discomfort Mcmurray's pain and mechanical, + Flexion-pinch pos Varus and valgus stress: stable Lachman: neg Ant and Post drawer: neg Hip abduction, IR, ER: WNL Hip flexion str: 5/5 Hip abd: 5/5 Quad: 4+/5 VMO atrophy: moderate Hamstring concentric and eccentric: 5/5   Dg Knee 1-2 Views Right  10/17/2011  *RADIOLOGY REPORT*  Clinical Data: Knee pain  RIGHT KNEE - 1-2 VIEW  Comparison: 12/03/2008  Findings: Three views of the right knee submitted. Again noted three  compartment degenerative changes.  There is spurring of medial and lateral femoral condyle.  Mild spurring of medial tibial plateau.  No significant joint effusion. No acute fracture or subluxation.  IMPRESSION: No acute fracture or subluxation.  Again noted three compartment degenerative changes.  Spurring of medial and lateral femoral condyle.  Original Report Authenticated By: Natasha Mead, M.D.   Dg Knee Bilateral Standing Ap  10/17/2011  *RADIOLOGY REPORT*  Clinical Data: Right knee pain.  Right knee osteoarthrosis.  BILATERAL KNEES STANDING - 1 VIEW  Comparison: None.  Findings: Bilateral medial compartment predominant osteoarthritis is present.  Marginal osteophytes are present bilaterally in the medial compartment.  Lateral compartment joint space is preserved. No destructive osseous lesions identified.  Mild varus deformity of the right knee.  IMPRESSION: Bilateral right greater than left medial compartment mild to moderate osteoarthritis.  Original Report Authenticated By: Andreas Newport, M.D.    Assessment and Plan:  1. Right knee pain  MR Knee Right Wo Contrast   Right knee pain, failure to respond to conservative care, 6 months of treatment. Trial of oral Voltaren, corticosteroid injections. Obtain an MRI of the right knee to evaluate for meniscal pathology. Potential ligamentous derangement cannot be excluded.  Orders Today: Orders Placed This Encounter  Procedures  . MR Knee Right Wo Contrast    Standing Status: Future     Number of Occurrences:      Standing Expiration Date: 01/06/2013    186 LBS/NOT CLAUS/NO NEEDS/INS/MEDICARE & BCBS/RLC/MARION-EPIC ORDER    Order Specific Question:  Does the patient have a pacemaker, internal devices, implants, aneury    Answer:  No    Order Specific Question:  Preferred imaging location?    Answer:  [id number]    Order Specific Question:  Reason for exam:    Answer:  eval for internal derangement, meniscal path, ligament injury     Medications Today: Meds ordered this encounter  Medications  . traMADol (ULTRAM) 50 MG tablet    Sig: Take 1 tablet (50 mg total) by mouth every 6 (six) hours as needed for pain.    Dispense:  50 tablet    Refill:  2  . HYDROcodone-acetaminophen (VICODIN) 5-500 MG per tablet    Sig: Take 1 tablet by mouth every 6 (six) hours as needed for pain.    Dispense:  20 tablet    Refill:  0      Mr Knee Right Wo Contrast  11/13/2011  *RADIOLOGY REPORT*  Clinical Data: Anterior knee pain for 2 years.  MRI OF THE RIGHT KNEE WITHOUT CONTRAST  Technique:  Multiplanar, multisequence MR imaging was performed. No intravenous contrast was administered.  Comparison: 10/17/2011  Findings: Complex tear of the posterior horn of the medial meniscus noted with oblique signal extending to the superior meniscal surface on image 20 of series 7 and to the inferior meniscal surface on image 18 of series 7.  There is a prominent radial  tear of the midbody of the medial meniscus with diminutive size and poor visualization of the meniscus in this vicinity.  The lateral meniscus, cruciate ligaments, distal quadriceps tendon, and patellar tendon appear intact.  Mild fluid signal intensity tracking superficial to the medial collateral ligament is likely incidental but in the appropriate setting could represent grade 1 MCL sprain.  The fibular collateral ligament, popliteus tendon, and remaining structures of the posterolateral corner the knee appear intact.  The patellar retinacula appear unremarkable.  Low-level edema infiltrates the adipose tissue deep to the medial patellar retinaculum.  Patellar chondral thinning is present with chondral irregularity along the medial patellar facet and posterior patellar ridge.  There is mild chondral thinning in the femoral trochlear groove.  Prominent articular cartilage thinning with full-thickness articular cartilage loss noted in the medial compartment with small degenerative subcortical  lesions shown on image 10 of series 5. Chondral thinning, irregularity, and fissuring noted in the lateral compartment.  Marginal osteophytes and patellofemoral osteophytes noted.  IMPRESSION:  1.  Complex tear of the posterior horn of the medial meniscus, with radial tearing in the midbody of the medial meniscus. 2.  Tri-compartmental osteoarthritis, with chondral thinning and irregularity as described above. 3.  Mild fluid signal intensity tracking superficial to the medial collateral ligament is likely incidental but in the appropriate setting could represent grade 1 MCL sprain.  Original Report Authenticated By: Dellia Cloud, M.D.   Will consult Orthopedic Surgery for definitive management

## 2011-11-07 NOTE — Telephone Encounter (Signed)
Patient has appt for today

## 2011-11-12 ENCOUNTER — Ambulatory Visit
Admission: RE | Admit: 2011-11-12 | Discharge: 2011-11-12 | Disposition: A | Discharge status: Home / Self Care | Payer: Medicare Other | Source: Ambulatory Visit | Attending: Family Medicine | Admitting: Family Medicine

## 2011-11-12 DIAGNOSIS — M25561 Pain in right knee: Secondary | ICD-10-CM

## 2011-11-13 NOTE — Progress Notes (Signed)
Addended by: Hannah Beat on: 11/13/2011 09:56 AM   Modules accepted: Orders

## 2011-11-23 ENCOUNTER — Other Ambulatory Visit: Payer: Self-pay | Admitting: Orthopedic Surgery

## 2011-12-07 ENCOUNTER — Ambulatory Visit (HOSPITAL_BASED_OUTPATIENT_CLINIC_OR_DEPARTMENT_OTHER)
Admission: RE | Admit: 2011-12-07 | Payer: BC Managed Care – PPO | Source: Ambulatory Visit | Admitting: Orthopedic Surgery

## 2011-12-07 ENCOUNTER — Encounter (HOSPITAL_BASED_OUTPATIENT_CLINIC_OR_DEPARTMENT_OTHER): Admission: RE | Payer: Self-pay | Source: Ambulatory Visit

## 2011-12-07 SURGERY — ARTHROSCOPY, KNEE, WITH MEDIAL MENISCECTOMY
Anesthesia: Choice | Laterality: Right

## 2012-04-23 ENCOUNTER — Encounter: Payer: Self-pay | Admitting: Family Medicine

## 2012-04-23 ENCOUNTER — Ambulatory Visit (INDEPENDENT_AMBULATORY_CARE_PROVIDER_SITE_OTHER): Payer: Medicare Other | Admitting: Family Medicine

## 2012-04-23 VITALS — BP 182/98 | HR 72 | Temp 98.3°F | Ht 62.0 in | Wt 184.5 lb

## 2012-04-23 DIAGNOSIS — E785 Hyperlipidemia, unspecified: Secondary | ICD-10-CM

## 2012-04-23 DIAGNOSIS — Z23 Encounter for immunization: Secondary | ICD-10-CM

## 2012-04-23 DIAGNOSIS — I10 Essential (primary) hypertension: Secondary | ICD-10-CM

## 2012-04-23 MED ORDER — AMLODIPINE BESYLATE 10 MG PO TABS: 10.0000 mg | ORAL_TABLET | Freq: Every day | ORAL | Status: DC

## 2012-04-23 NOTE — Assessment & Plan Note (Signed)
Per pt - diet is much imp, no longer eats fried food or greasy meats  Lab today Will disc further at f/u

## 2012-04-23 NOTE — Patient Instructions (Addendum)
Labs today Flu shot today  Try to avoid ibuprofen or aleve over the counter as much as you can because it can raise blood pressure  Use tylenol instead  Start back on amlodipine Keep an eye on your blood pressure- if worse or not improving let me know  Follow up with me in 3-4 weeks

## 2012-04-23 NOTE — Assessment & Plan Note (Signed)
bp is up off amlodipine and on nsaids  Will hold nsaid if possible Start back on amlodipine 10  Work on diet and exercise  F/u 3-4 wk  Lab today

## 2012-04-23 NOTE — Progress Notes (Signed)
Subjective:    Patient ID: Erica Carlson, female    DOB: December 26, 1945, 66 y.o.   MRN: [id number]  HPI Here for HTN  bp pills ran out after her knee surgery   Her surgery went very well - is happy about that   At home bp is 154/84  Always higher here than at home     Chemistry      Component Value Date/Time   NA 141 12/11/2010 0933   K 4.3 12/11/2010 0933   CL 107 12/11/2010 0933   CO2 27 12/11/2010 0933   BUN 14 12/11/2010 0933   CREATININE 0.8 12/11/2010 0933      Component Value Date/Time   CALCIUM 9.4 12/11/2010 0933   ALKPHOS 56 12/11/2010 0933   AST 13 12/11/2010 0933   ALT 12 12/11/2010 0933   BILITOT 0.4 12/11/2010 0933      Feels a little funny  Had a lot of stress last week  Took some ibuprofen 2 d ago - on and off   bp is up significantly today  No cp or palpitations or headaches or edema  No side effects to medicines  BP Readings from Last 3 Encounters:  04/23/12 182/98  11/07/11 150/100  10/17/11 140/80     Patient Active Problem List  Diagnosis  . HYPERLIPIDEMIA  . HYPERTENSION  . HEMORRHOIDS  . OSTEOARTHRITIS, KNEE, RIGHT  . KNEE PAIN, RIGHT  . ANSERINE BURSITIS, RIGHT  . HEEL PAIN, RIGHT  . WEIGHT GAIN  . CERVICAL MUSCLE STRAIN  . PNEUMONIA, HX OF  . HIP PAIN, RIGHT  . BACK PAIN WITH RADICULOPATHY  . Routine general medical examination at a health care facility  . Anemia, mild  . Special screening for malignant neoplasms, colon  . Other screening mammogram  . Zoster   Past Medical History  Diagnosis Date  . Pneumonia   . Arthritis     osteo arthritis of right knee   Past Surgical History  Procedure Date  . Abdominal hysterectomy   . Breast surgery    History  Substance Use Topics  . Smoking status: Never Smoker   . Smokeless tobacco: Never Used  . Alcohol Use: No   Family History  Problem Relation Age of Onset  . Breast cancer Sister    No Known Allergies Current Outpatient Prescriptions on File Prior to Visit  Medication Sig Dispense  Refill  . diphenhydrAMINE (BENADRYL) 25 mg capsule Take 25 mg by mouth as needed.      Marland Kitchen amLODipine (NORVASC) 10 MG tablet Take 1 tablet (10 mg total) by mouth daily.  30 tablet  6     Review of Systems    Review of Systems  Constitutional: Negative for fever, appetite change, fatigue and unexpected weight change.  Eyes: Negative for pain and visual disturbance.  Respiratory: Negative for cough and shortness of breath.   Cardiovascular: Negative for cp or palpitations    Gastrointestinal: Negative for nausea, diarrhea and constipation.  Genitourinary: Negative for urgency and frequency.  Skin: Negative for pallor or rash   Neurological: Negative for weakness, light-headedness, numbness and headaches. (occasional sensation of head pressure when bp is up ) MSK pos for knee pain that is improving  Hematological: Negative for adenopathy. Does not bruise/bleed easily.  Psychiatric/Behavioral: Negative for dysphoric mood. The patient is not nervous/anxious.      Objective:   Physical Exam  Constitutional: She appears well-developed and well-nourished. No distress.       obese and well  appearing   HENT:  Head: Normocephalic and atraumatic.  Mouth/Throat: Oropharynx is clear and moist.  Eyes: Conjunctivae normal and EOM are normal. Pupils are equal, round, and reactive to light. No scleral icterus.  Neck: Normal range of motion. Neck supple. No JVD present. Carotid bruit is not present. No thyromegaly present.  Cardiovascular: Normal rate, regular rhythm and intact distal pulses.  Exam reveals no gallop.   Pulmonary/Chest: Effort normal and breath sounds normal. No respiratory distress. She has no wheezes.  Abdominal: Soft. Bowel sounds are normal. She exhibits no distension, no abdominal bruit and no mass. There is no tenderness.  Musculoskeletal: She exhibits no edema.  Lymphadenopathy:    She has no cervical adenopathy.  Neurological: She is alert. She has normal reflexes. No cranial  nerve deficit. She exhibits normal muscle tone. Coordination normal.  Skin: Skin is warm and dry. No rash noted. No erythema. No pallor.  Psychiatric: She has a normal mood and affect.          Assessment & Plan:

## 2012-04-24 LAB — CBC WITH DIFFERENTIAL/PLATELET
Basophils Relative: 0.4 % (ref 0.0–3.0)
Eosinophils Absolute: 0.2 10*3/uL (ref 0.0–0.7)
Lymphs Abs: 2.8 10*3/uL (ref 0.7–4.0)
MCHC: 32 g/dL (ref 30.0–36.0)
MCV: 83 fl (ref 78.0–100.0)
Monocytes Absolute: 0.5 10*3/uL (ref 0.1–1.0)
Neutrophils Relative %: 39 % — ABNORMAL LOW (ref 43.0–77.0)
Platelets: 309 10*3/uL (ref 150.0–400.0)
RBC: 5.12 Mil/uL — ABNORMAL HIGH (ref 3.87–5.11)

## 2012-04-24 LAB — COMPREHENSIVE METABOLIC PANEL
AST: 18 U/L (ref 0–37)
Albumin: 3.8 g/dL (ref 3.5–5.2)
Alkaline Phosphatase: 76 U/L (ref 39–117)
BUN: 13 mg/dL (ref 6–23)
Potassium: 4 mEq/L (ref 3.5–5.1)
Total Bilirubin: 0.5 mg/dL (ref 0.3–1.2)

## 2012-04-24 LAB — LDL CHOLESTEROL, DIRECT: Direct LDL: 249.3 mg/dL

## 2012-04-24 LAB — LIPID PANEL
Cholesterol: 325 mg/dL — ABNORMAL HIGH (ref 0–200)
HDL: 50.2 mg/dL (ref >39.00)
Total CHOL/HDL Ratio: 6
Triglycerides: 172 mg/dL — ABNORMAL HIGH (ref 0.0–149.0)
VLDL: 34.4 mg/dL (ref 0.0–40.0)

## 2012-05-13 ENCOUNTER — Other Ambulatory Visit: Payer: Self-pay | Admitting: Family Medicine

## 2012-05-13 DIAGNOSIS — Z1231 Encounter for screening mammogram for malignant neoplasm of breast: Secondary | ICD-10-CM

## 2012-05-19 ENCOUNTER — Ambulatory Visit: Payer: Medicare Other | Admitting: Family Medicine

## 2012-05-27 ENCOUNTER — Encounter: Payer: Self-pay | Admitting: Family Medicine

## 2012-05-27 ENCOUNTER — Ambulatory Visit (INDEPENDENT_AMBULATORY_CARE_PROVIDER_SITE_OTHER): Payer: Medicare Other | Admitting: Family Medicine

## 2012-05-27 VITALS — BP 140/80 | HR 74 | Temp 98.1°F | Ht 62.0 in | Wt 188.5 lb

## 2012-05-27 DIAGNOSIS — I10 Essential (primary) hypertension: Secondary | ICD-10-CM

## 2012-05-27 DIAGNOSIS — E785 Hyperlipidemia, unspecified: Secondary | ICD-10-CM

## 2012-05-27 MED ORDER — ATORVASTATIN CALCIUM 20 MG PO TABS: 20.0000 mg | ORAL_TABLET | Freq: Every day | ORAL | Status: DC

## 2012-05-27 NOTE — Progress Notes (Signed)
Subjective:    Patient ID: Erica Carlson, female    DOB: 07/25/1945, 66 y.o.   MRN: [id number]  HPI Here for f/u of HTN and hyperlipidemia  Wt is up 4 lb with bmi of 34  Last visit bp high Went back on amlodipine 10-no problems at all - is very compliant  Today a bit better , but still high on first check No cp or palpitations or headaches or edema  No side effects to medicines  BP Readings from Last 3 Encounters:  05/27/12 164/96  04/23/12 182/98  11/07/11 150/100    At home her bp is very good one teens - 120s / 70-80 ? whitecoat  Has a digital arm cuff  She brought it in to check it - is calibrated well   Not a whole lot of exercise due to leg or back  Some walking  [facility]    Hyperlipidemia Lab Results  Component Value Date   CHOL 325* 04/23/2012   CHOL 288* 12/11/2010   CHOL 291* 03/03/2009   Lab Results  Component Value Date   HDL 50.20 04/23/2012   HDL 72.40 12/11/2010   HDL 47.50 03/03/2009   No results found for this basename: [facility]   Lab Results  Component Value Date   TRIG 172.0* 04/23/2012   TRIG 98.0 12/11/2010   TRIG 174.0* 03/03/2009   Lab Results  Component Value Date   CHOLHDL 6 04/23/2012   CHOLHDL 4 12/11/2010   CHOLHDL 6 03/03/2009   Lab Results  Component Value Date   LDLDIRECT 249.3 04/23/2012   LDLDIRECT 217.7 12/11/2010   LDLDIRECT 204.1 03/03/2009    Was on zocor- but stopped it with pot interaction with amlodipine  Diet -not eating any grease Is hereditary   Is open to generic lipitor   Patient Active Problem List  Diagnosis  . HYPERLIPIDEMIA  . Hypertension  . HEMORRHOIDS  . OSTEOARTHRITIS, KNEE, RIGHT  . KNEE PAIN, RIGHT  . ANSERINE BURSITIS, RIGHT  . HEEL PAIN, RIGHT  . WEIGHT GAIN  . CERVICAL MUSCLE STRAIN  . PNEUMONIA, HX OF  . HIP PAIN, RIGHT  . BACK PAIN WITH RADICULOPATHY  . Routine general medical examination at a health care facility  . Anemia, mild  . Special screening for malignant  neoplasms, colon  . Other screening mammogram  . Zoster   Past Medical History  Diagnosis Date  . Pneumonia   . Arthritis     osteo arthritis of right knee   Past Surgical History  Procedure Date  . Abdominal hysterectomy   . Breast surgery    History  Substance Use Topics  . Smoking status: Never Smoker   . Smokeless tobacco: Never Used  . Alcohol Use: No   Family History  Problem Relation Age of Onset  . Breast cancer Sister    No Known Allergies Current Outpatient Prescriptions on File Prior to Visit  Medication Sig Dispense Refill  . amLODipine (NORVASC) 10 MG tablet Take 1 tablet (10 mg total) by mouth daily.  30 tablet  6  . diphenhydrAMINE (BENADRYL) 25 mg capsule Take 25 mg by mouth as needed.         Review of Systems Review of Systems  Constitutional: Negative for fever, appetite change, fatigue and unexpected weight change.  Eyes: Negative for pain and visual disturbance.  Respiratory: Negative for cough and shortness of breath.   Cardiovascular: Negative for cp or palpitations    Gastrointestinal: Negative  for nausea, diarrhea and constipation.  Genitourinary: Negative for urgency and frequency.  Skin: Negative for pallor or rash   Neurological: Negative for weakness, light-headedness, numbness and headaches.  Hematological: Negative for adenopathy. Does not bruise/bleed easily.  Psychiatric/Behavioral: Negative for dysphoric mood. The patient is not nervous/anxious.         Objective:   Physical Exam  Constitutional: She appears well-developed and well-nourished. No distress.  HENT:  Head: Normocephalic and atraumatic.  Mouth/Throat: Oropharynx is clear and moist.  Eyes: Conjunctivae normal and EOM are normal. Pupils are equal, round, and reactive to light. Right eye exhibits no discharge. Left eye exhibits no discharge. No scleral icterus.  Neck: Normal range of motion. Neck supple. No JVD present. Carotid bruit is not present. No thyromegaly  present.  Cardiovascular: Normal rate, regular rhythm, normal heart sounds and intact distal pulses.  Exam reveals no gallop.   Pulmonary/Chest: Effort normal and breath sounds normal. No respiratory distress. She has no wheezes.  Abdominal: Soft. Bowel sounds are normal. She exhibits no abdominal bruit.  Musculoskeletal: She exhibits no edema.  Lymphadenopathy:    She has no cervical adenopathy.  Neurological: She is alert. She has normal reflexes. No cranial nerve deficit. She exhibits normal muscle tone. Coordination normal.  Skin: Skin is warm and dry. No rash noted.  Psychiatric: She has a normal mood and affect.          Assessment & Plan:

## 2012-05-27 NOTE — Patient Instructions (Addendum)
Stay on the amlodipine for blood pressure - keep an eye on it a home  Think about water exercise  Start on generic lipitor (atrovastatin) 20 mg  Schedule fasting labs in 6 weeks Avoid red meat/ fried foods/ egg yolks/ fatty breakfast meats/ butter, cheese and high fat dairy/ and shellfish

## 2012-05-27 NOTE — Assessment & Plan Note (Signed)
Cholesterol very high off zocor (took off of that due to amlodipine) Trial of generic lipitor 20 Lab 6 wk  Will titrate as needed Disc goals for lipids and reasons to control them Rev labs with pt Rev low sat fat diet in detail

## 2012-05-27 NOTE — Assessment & Plan Note (Signed)
Much improved back on amlodipine Some white coat effect- even lower at home  F/u jan for annual exam  Disc good health habits - suggested trying water exercise

## 2012-06-18 ENCOUNTER — Ambulatory Visit
Admission: RE | Admit: 2012-06-18 | Discharge: 2012-06-18 | Disposition: A | Discharge status: Home / Self Care | Payer: Medicare Other | Source: Ambulatory Visit | Attending: Family Medicine | Admitting: Family Medicine

## 2012-06-18 ENCOUNTER — Other Ambulatory Visit: Payer: Self-pay | Admitting: Family Medicine

## 2012-06-18 DIAGNOSIS — Z1231 Encounter for screening mammogram for malignant neoplasm of breast: Secondary | ICD-10-CM

## 2012-06-19 ENCOUNTER — Encounter: Payer: Self-pay | Admitting: *Deleted

## 2012-07-07 ENCOUNTER — Other Ambulatory Visit (INDEPENDENT_AMBULATORY_CARE_PROVIDER_SITE_OTHER): Payer: Medicare Other

## 2012-07-07 DIAGNOSIS — E785 Hyperlipidemia, unspecified: Secondary | ICD-10-CM

## 2012-07-07 LAB — ALT: ALT: 22 U/L (ref 0–35)

## 2012-07-07 LAB — LIPID PANEL: Total CHOL/HDL Ratio: 4

## 2012-07-07 LAB — AST: AST: 17 U/L (ref 0–37)

## 2012-07-08 ENCOUNTER — Encounter: Payer: Self-pay | Admitting: *Deleted

## 2012-09-17 ENCOUNTER — Encounter: Payer: Self-pay | Admitting: Family Medicine

## 2012-09-17 ENCOUNTER — Ambulatory Visit (INDEPENDENT_AMBULATORY_CARE_PROVIDER_SITE_OTHER): Payer: Medicare PPO | Admitting: Family Medicine

## 2012-09-17 VITALS — BP 136/86 | HR 71 | Temp 98.2°F | Ht 62.25 in | Wt 192.2 lb

## 2012-09-17 DIAGNOSIS — E2839 Other primary ovarian failure: Secondary | ICD-10-CM | POA: Insufficient documentation

## 2012-09-17 MED ORDER — AMLODIPINE BESYLATE 10 MG PO TABS: 10.0000 mg | ORAL_TABLET | Freq: Every day | ORAL | Status: DC

## 2012-09-17 NOTE — Assessment & Plan Note (Signed)
bp in fair control at this time  No changes needed  Disc lifstyle change with low sodium diet and exercise  Labs rev from fall and dec

## 2012-09-17 NOTE — Patient Instructions (Addendum)
If you are interested in a shingles/zoster vaccine - call your insurance to check on coverage,( you should not get it within 1 month of other vaccines) , then call us for a prescription  for it to take to a pharmacy that gives the shot , or make a nurse visit to get it here depending on your coverage If a chiropractor cannot help you with your back - consider seeing your orthopedic doctor / Dr Ethelene Hal I think water exercise woud be very good for you - please consider it  Shirlee Limerick will be calling you to set up your bone density screen

## 2012-09-17 NOTE — Assessment & Plan Note (Signed)
Pt will see chiropractor and start water exercise- if not imp will return to Dr Ethelene Hal

## 2012-09-17 NOTE — Assessment & Plan Note (Signed)
Ref for screen dexa Disc ca and D No fractures and no falls

## 2012-09-17 NOTE — Progress Notes (Signed)
Subjective:    Patient ID: Erica Carlson, female    DOB: [birth year], 67 y.o.   MRN: [id number]  HPI Here for check up of chronic medical conditions and to review health mt list   Is having problems with her sciatic nerve on her R side - just started last week  Has not taken any nsaids  Has chronic back pain also  Saw chiropractor last year  Has had shots in her back as well    Wt is up 4 lb with bmi of 34 Less active this winter , and also due to her aches and pains- a lot of sitting  Eating the same  She is going to try a water aerobics class next month  Insurance will pay for gym  Zoster status- had shingles last year -not vaccine   mammo 12/13- and ok  Self exam-no lumps or changes  fam hx - sister with B ca  colonosc 09-was clear -10 year recall , no fam hx   Had total hyst No cancer  No gyn symptoms   Falls-none  Bone density- never had a test  Interested in that to screen for OP 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   Mood- stays pretty positive-no depression   Hyperlipidemia On lipitor-no problems Diet is good -low fat  Lab Results  Component Value Date   CHOL 194 07/07/2012   CHOL 325* 04/23/2012   CHOL 288* 12/11/2010   Lab Results  Component Value Date   HDL 49.90 07/07/2012   HDL 50.20 04/23/2012   HDL 72.40 12/11/2010   Lab Results  Component Value Date   LDLCALC 115* 07/07/2012   Lab Results  Component Value Date   TRIG 145.0 07/07/2012   TRIG 172.0* 04/23/2012   TRIG 98.0 12/11/2010   Lab Results  Component Value Date   CHOLHDL 4 07/07/2012   CHOLHDL 6 04/23/2012   CHOLHDL 4 12/11/2010   Lab Results  Component Value Date   LDLDIRECT 249.3 04/23/2012   LDLDIRECT 217.7 12/11/2010   LDLDIRECT 204.1 03/03/2009   huge improvement with the lipitor  bp is stable today  No cp or palpitations or headaches or edema  No side effects to medicines  BP Readings from Last 3 Encounters:  09/17/12 136/86  05/27/12 140/80  04/23/12 182/98       Patient  Active Problem List  Diagnosis  . HYPERLIPIDEMIA  . Hypertension  . HEMORRHOIDS  . OSTEOARTHRITIS, KNEE, RIGHT  . KNEE PAIN, RIGHT  . ANSERINE BURSITIS, RIGHT  . HEEL PAIN, RIGHT  . WEIGHT GAIN  . CERVICAL MUSCLE STRAIN  . PNEUMONIA, HX OF  . HIP PAIN, RIGHT  . BACK PAIN WITH RADICULOPATHY  . Routine general medical examination at a health care facility  . Anemia, mild  . Special screening for malignant neoplasms, colon  . Other screening mammogram  . Zoster   Past Medical History  Diagnosis Date  . Pneumonia   . Arthritis     osteo arthritis of right knee   Past Surgical History  Procedure Laterality Date  . Abdominal hysterectomy    . Breast surgery     History  Substance Use Topics  . Smoking status: Never Smoker   . Smokeless tobacco: Never Used  . Alcohol Use: No   Family History  Problem Relation Age of Onset  . Breast cancer Sister    No Known Allergies Current Outpatient Prescriptions on File Prior to Visit  Medication Sig Dispense Refill  . amLODipine (NORVASC)  10 MG tablet Take 1 tablet (10 mg total) by mouth daily.  30 tablet  6  . atorvastatin (LIPITOR) 20 MG tablet Take 1 tablet (20 mg total) by mouth daily.  30 tablet  11  . diphenhydrAMINE (BENADRYL) 25 mg capsule Take 25 mg by mouth as needed.       No current facility-administered medications on file prior to visit.    Review of Systems Review of Systems  Constitutional: Negative for fever, appetite change, fatigue and unexpected weight change.  Eyes: Negative for pain and visual disturbance.  Respiratory: Negative for cough and shortness of breath.   Cardiovascular: Negative for cp or palpitations    Gastrointestinal: Negative for nausea, diarrhea and constipation.  Genitourinary: Negative for urgency and frequency.  Skin: Negative for pallor or rash   MSK pos for back pain/ sciatica Neurological: Negative for weakness, light-headedness, numbness and headaches.  Hematological: Negative  for adenopathy. Does not bruise/bleed easily.  Psychiatric/Behavioral: Negative for dysphoric mood. The patient is not nervous/anxious.         Objective:   Physical Exam  Constitutional: She appears well-developed and well-nourished. No distress.  obese and well appearing   HENT:  Head: Normocephalic and atraumatic.  Right Ear: External ear normal.  Left Ear: External ear normal.  Nose: Nose normal.  Mouth/Throat: Oropharynx is clear and moist.  Eyes: Conjunctivae and EOM are normal. Pupils are equal, round, and reactive to light. Right eye exhibits no discharge. Left eye exhibits no discharge. No scleral icterus.  Neck: Normal range of motion. Neck supple. No JVD present. Carotid bruit is not present. No thyromegaly present.  Cardiovascular: Normal rate, regular rhythm, normal heart sounds and intact distal pulses.  Exam reveals no gallop.   Pulmonary/Chest: Effort normal and breath sounds normal. No respiratory distress. She has no wheezes.  Abdominal: Soft. Bowel sounds are normal. She exhibits no distension, no abdominal bruit and no mass. There is no tenderness.  Genitourinary: No breast swelling, tenderness, discharge or bleeding.  Breast exam: No mass, nodules, thickening, tenderness, bulging, retraction, inflamation, nipple discharge or skin changes noted.  No axillary or clavicular LA.  Chaperoned exam.    Musculoskeletal: She exhibits no edema and no tenderness.  Poor rom LS  Lymphadenopathy:    She has no cervical adenopathy.  Neurological: She is alert. She has normal reflexes. No cranial nerve deficit. She exhibits normal muscle tone. Coordination normal.  Skin: Skin is warm and dry. No rash noted. No erythema. No pallor.  Psychiatric: She has a normal mood and affect.          Assessment & Plan:

## 2013-04-29 ENCOUNTER — Ambulatory Visit (INDEPENDENT_AMBULATORY_CARE_PROVIDER_SITE_OTHER): Payer: Medicare PPO

## 2013-04-29 DIAGNOSIS — Z23 Encounter for immunization: Secondary | ICD-10-CM

## 2013-06-18 ENCOUNTER — Other Ambulatory Visit: Payer: Self-pay

## 2013-06-18 DIAGNOSIS — Z1231 Encounter for screening mammogram for malignant neoplasm of breast: Secondary | ICD-10-CM

## 2013-07-20 ENCOUNTER — Ambulatory Visit
Admission: RE | Admit: 2013-07-20 | Discharge: 2013-07-20 | Disposition: A | Discharge status: Home / Self Care | Payer: Medicare PPO | Source: Ambulatory Visit

## 2013-07-20 ENCOUNTER — Other Ambulatory Visit: Payer: Self-pay

## 2013-07-20 ENCOUNTER — Ambulatory Visit
Admission: RE | Admit: 2013-07-20 | Discharge: 2013-07-20 | Disposition: A | Discharge status: Home / Self Care | Payer: Medicare PPO | Source: Ambulatory Visit | Attending: Family Medicine | Admitting: Family Medicine

## 2013-07-20 ENCOUNTER — Other Ambulatory Visit: Payer: Self-pay | Admitting: Family Medicine

## 2013-07-20 DIAGNOSIS — Z1231 Encounter for screening mammogram for malignant neoplasm of breast: Secondary | ICD-10-CM

## 2013-08-14 ENCOUNTER — Ambulatory Visit (INDEPENDENT_AMBULATORY_CARE_PROVIDER_SITE_OTHER)
Admission: RE | Admit: 2013-08-14 | Discharge: 2013-08-14 | Disposition: A | Discharge status: Home / Self Care | Payer: Medicare PPO | Source: Ambulatory Visit | Attending: Internal Medicine | Admitting: Internal Medicine

## 2013-08-14 ENCOUNTER — Encounter: Payer: Self-pay | Admitting: Internal Medicine

## 2013-08-14 ENCOUNTER — Ambulatory Visit (INDEPENDENT_AMBULATORY_CARE_PROVIDER_SITE_OTHER): Payer: Medicare PPO | Admitting: Internal Medicine

## 2013-08-14 VITALS — BP 160/88 | HR 71 | Temp 98.4°F | Wt 193.1 lb

## 2013-08-14 DIAGNOSIS — M7542 Impingement syndrome of left shoulder: Secondary | ICD-10-CM

## 2013-08-14 DIAGNOSIS — M25819 Other specified joint disorders, unspecified shoulder: Secondary | ICD-10-CM

## 2013-08-14 DIAGNOSIS — M7580 Other shoulder lesions, unspecified shoulder: Secondary | ICD-10-CM

## 2013-08-14 MED ORDER — DICLOFENAC SODIUM 75 MG PO TBEC: 75.0000 mg | DELAYED_RELEASE_TABLET | Freq: Two times a day (BID) | ORAL | Status: DC

## 2013-08-14 NOTE — Patient Instructions (Addendum)
It was good to see you today.  We have reviewed your prior records including labs and tests today  Medications reviewed and updated -take diclofenac twice daily with food for the next 10-14 days, then as needed  Your prescription(s) have been submitted to your pharmacy. Please take as directed and contact our office if you believe you are having problem(s) with the medication(s).  Test(s) ordered today. Your results will be released to [facility]is (or called to you) after review, usually within 72hours after test completion. If any changes need to be made, you will be notified at that same time.  Impingement Syndrome, Rotator Cuff, Bursitis with Rehab Impingement syndrome is a condition that involves inflammation of the tendons of the rotator cuff and the subacromial bursa, that causes pain in the shoulder. The rotator cuff consists of four tendons and muscles that control much of the shoulder and upper arm function. The subacromial bursa is a fluid filled sac that helps reduce friction between the rotator cuff and one of the bones of the shoulder (acromion). Impingement syndrome is usually an overuse injury that causes swelling of the bursa (bursitis), swelling of the tendon (tendonitis), and/or a tear of the tendon (strain). Strains are classified into three categories. Grade 1 strains cause pain, but the tendon is not lengthened. Grade 2 strains include a lengthened ligament, due to the ligament being stretched or partially ruptured. With grade 2 strains there is still function, although the function may be decreased. Grade 3 strains include a complete tear of the tendon or muscle, and function is usually impaired. SYMPTOMS   Pain around the shoulder, often at the outer portion of the upper arm.  Pain that gets worse with shoulder function, especially when reaching overhead or lifting.  Sometimes, aching when not using the arm.  Pain that wakes you up at night.  Sometimes, tenderness, swelling,  warmth, or redness over the affected area.  Loss of strength.  Limited motion of the shoulder, especially reaching behind the back (to the back pocket or to unhook bra) or across your body.  Crackling sound (crepitation) when moving the arm.  Biceps tendon pain and inflammation (in the front of the shoulder). Worse when bending the elbow or lifting. CAUSES  Impingement syndrome is often an overuse injury, in which chronic (repetitive) motions cause the tendons or bursa to become inflamed. A strain occurs when a force is paced on the tendon or muscle that is greater than it can withstand. Common mechanisms of injury include: Stress from sudden increase in duration, frequency, or intensity of training.  Direct hit (trauma) to the shoulder.  Aging, erosion of the tendon with normal use.  Bony bump on shoulder (acromial spur). RISK INCREASES WITH:  Contact sports (football, wrestling, boxing).  Throwing sports (baseball, tennis, volleyball).  Weightlifting and bodybuilding.  Heavy labor.  Previous injury to the rotator cuff, including impingement.  Poor shoulder strength and flexibility.  Failure to warm up properly before activity.  Inadequate protective equipment.  Old age.  Bony bump on shoulder (acromial spur). PREVENTION   Warm up and stretch properly before activity.  Allow for adequate recovery between workouts.  Maintain physical fitness:  Strength, flexibility, and endurance.  Cardiovascular fitness.  Learn and use proper exercise technique. PROGNOSIS  If treated properly, impingement syndrome usually goes away within 6 weeks. Sometimes surgery is required.  RELATED COMPLICATIONS   Longer healing time if not properly treated, or if not given enough time to heal.  Recurring symptoms, that result  in a chronic condition.  Shoulder stiffness, frozen shoulder, or loss of motion.  Rotator cuff tendon tear.  Recurring symptoms, especially if activity is  resumed too soon, with overuse, with a direct blow, or when using poor technique. TREATMENT  Treatment first involves the use of ice and medicine, to reduce pain and inflammation. The use of strengthening and stretching exercises may help reduce pain with activity. These exercises may be performed at home or with a therapist. If non-surgical treatment is unsuccessful after more than 6 months, surgery may be advised. After surgery and rehabilitation, activity is usually possible in 3 months.  MEDICATION  If pain medicine is needed, nonsteroidal anti-inflammatory medicines (aspirin and ibuprofen), or other minor pain relievers (acetaminophen), are often advised.  Do not take pain medicine for 7 days before surgery.  Prescription pain relievers may be given, if your caregiver thinks they are needed. Use only as directed and only as much as you need.  Corticosteroid injections may be given by your caregiver. These injections should be reserved for the most serious cases, because they may only be given a certain number of times. HEAT AND COLD  Cold treatment (icing) should be applied for 10 to 15 minutes every 2 to 3 hours for inflammation and pain, and immediately after activity that aggravates your symptoms. Use ice packs or an ice massage.  Heat treatment may be used before performing stretching and strengthening activities prescribed by your caregiver, physical therapist, or athletic trainer. Use a heat pack or a warm water soak. SEEK MEDICAL CARE IF:   Symptoms get worse or do not improve in 4 to 6 weeks, despite treatment.  New, unexplained symptoms develop. (Drugs used in treatment may produce side effects.) EXERCISES  RANGE OF MOTION (ROM) AND STRETCHING EXERCISES - Impingement Syndrome (Rotator Cuff  Tendinitis, Bursitis) These exercises may help you when beginning to rehabilitate your injury. Your symptoms may go away with or without further involvement from your physician, physical  therapist or athletic trainer. While completing these exercises, remember:   Restoring tissue flexibility helps normal motion to return to the joints. This allows healthier, less painful movement and activity.  An effective stretch should be held for at least 30 seconds.  A stretch should never be painful. You should only feel a gentle lengthening or release in the stretched tissue. STRETCH  Flexion, Standing  Stand with good posture. With an underhand grip on your right / left hand, and an overhand grip on the opposite hand, grasp a broomstick or cane so that your hands are a little more than shoulder width apart.  Keeping your right / left elbow straight and shoulder muscles relaxed, push the stick with your opposite hand, to raise your right / left arm in front of your body and then overhead. Raise your arm until you feel a stretch in your right / left shoulder, but before you have increased shoulder pain.  Try to avoid shrugging your right / left shoulder as your arm rises, by keeping your shoulder blade tucked down and toward your mid-back spine. Hold for __________ seconds.  Slowly return to the starting position. Repeat __________ times. Complete this exercise __________ times per day. STRETCH  Abduction, Supine  Lie on your back. With an underhand grip on your right / left hand and an overhand grip on the opposite hand, grasp a broomstick or cane so that your hands are a little more than shoulder width apart.  Keeping your right / left elbow straight and  your shoulder muscles relaxed, push the stick with your opposite hand, to raise your right / left arm out to the side of your body and then overhead. Raise your arm until you feel a stretch in your right / left shoulder, but before you have increased shoulder pain.  Try to avoid shrugging your right / left shoulder as your arm rises, by keeping your shoulder blade tucked down and toward your mid-back spine. Hold for __________  seconds.  Slowly return to the starting position. Repeat __________ times. Complete this exercise __________ times per day. ROM  Flexion, Active-Assisted  Lie on your back. You may bend your knees for comfort.  Grasp a broomstick or cane so your hands are about shoulder width apart. Your right / left hand should grip the end of the stick, so that your hand is positioned "thumbs-up," as if you were about to shake hands.  Using your healthy arm to lead, raise your right / left arm overhead, until you feel a gentle stretch in your shoulder. Hold for __________ seconds.  Use the stick to assist in returning your right / left arm to its starting position. Repeat __________ times. Complete this exercise __________ times per day.  ROM - Internal Rotation, Supine   Lie on your back on a firm surface. Place your right / left elbow about 60 degrees away from your side. Elevate your elbow with a folded towel, so that the elbow and shoulder are the same height.  Using a broomstick or cane and your strong arm, pull your right / left hand toward your body until you feel a gentle stretch, but no increase in your shoulder pain. Keep your shoulder and elbow in place throughout the exercise.  Hold for __________ seconds. Slowly return to the starting position. Repeat __________ times. Complete this exercise __________ times per day. STRETCH - Internal Rotation  Place your right / left hand behind your back, palm up.  Throw a towel or belt over your opposite shoulder. Grasp the towel with your right / left hand.  While keeping an upright posture, gently pull up on the towel, until you feel a stretch in the front of your right / left shoulder.  Avoid shrugging your right / left shoulder as your arm rises, by keeping your shoulder blade tucked down and toward your mid-back spine.  Hold for __________ seconds. Release the stretch, by lowering your healthy hand. Repeat __________ times. Complete this  exercise __________ times per day. ROM - Internal Rotation   Using an underhand grip, grasp a stick behind your back with both hands.  While standing upright with good posture, slide the stick up your back until you feel a mild stretch in the front of your shoulder.  Hold for __________ seconds. Slowly return to your starting position. Repeat __________ times. Complete this exercise __________ times per day.  STRETCH  Posterior Shoulder Capsule   Stand or sit with good posture. Grasp your right / left elbow and draw it across your chest, keeping it at the same height as your shoulder.  Pull your elbow, so your upper arm comes in closer to your chest. Pull until you feel a gentle stretch in the back of your shoulder.  Hold for __________ seconds. Repeat __________ times. Complete this exercise __________ times per day. STRENGTHENING EXERCISES - Impingement Syndrome (Rotator Cuff Tendinitis, Bursitis) These exercises may help you when beginning to rehabilitate your injury. They may resolve your symptoms with or without further involvement from your  physician, physical therapist or athletic trainer. While completing these exercises, remember:  Muscles can gain both the endurance and the strength needed for everyday activities through controlled exercises.  Complete these exercises as instructed by your physician, physical therapist or athletic trainer. Increase the resistance and repetitions only as guided.  You may experience muscle soreness or fatigue, but the pain or discomfort you are trying to eliminate should never worsen during these exercises. If this pain does get worse, stop and make sure you are following the directions exactly. If the pain is still present after adjustments, discontinue the exercise until you can discuss the trouble with your clinician.  During your recovery, avoid activity or exercises which involve actions that place your injured hand or elbow above your head or  behind your back or head. These positions stress the tissues which you are trying to heal. STRENGTH - Scapular Depression and Adduction   With good posture, sit on a firm chair. Support your arms in front of you, with pillows, arm rests, or on a table top. Have your elbows in line with the sides of your body.  Gently draw your shoulder blades down and toward your mid-back spine. Gradually increase the tension, without tensing the muscles along the top of your shoulders and the back of your neck.  Hold for __________ seconds. Slowly release the tension and relax your muscles completely before starting the next repetition.  After you have practiced this exercise, remove the arm support and complete the exercise in standing as well as sitting position. Repeat __________ times. Complete this exercise __________ times per day.  STRENGTH - Shoulder Abductors, Isometric  With good posture, stand or sit about 4-6 inches from a wall, with your right / left side facing the wall.  Bend your right / left elbow. Gently press your right / left elbow into the wall. Increase the pressure gradually, until you are pressing as hard as you can, without shrugging your shoulder or increasing any shoulder discomfort.  Hold for __________ seconds.  Release the tension slowly. Relax your shoulder muscles completely before you begin the next repetition. Repeat __________ times. Complete this exercise __________ times per day.  STRENGTH - External Rotators, Isometric  Keep your right / left elbow at your side and bend it 90 degrees.  Step into a door frame so that the outside of your right / left wrist can press against the door frame without your upper arm leaving your side.  Gently press your right / left wrist into the door frame, as if you were trying to swing the back of your hand away from your stomach. Gradually increase the tension, until you are pressing as hard as you can, without shrugging your shoulder  or increasing any shoulder discomfort.  Hold for __________ seconds.  Release the tension slowly. Relax your shoulder muscles completely before you begin the next repetition. Repeat __________ times. Complete this exercise __________ times per day.  STRENGTH - Supraspinatus   Stand or sit with good posture. Grasp a __________ weight, or an exercise band or tubing, so that your hand is "thumbs-up," like you are shaking hands.  Slowly lift your right / left arm in a "V" away from your thigh, diagonally into the space between your side and straight ahead. Lift your hand to shoulder height or as far as you can, without increasing any shoulder pain. At first, many people do not lift their hands above shoulder height.  Avoid shrugging your right / left shoulder  as your arm rises, by keeping your shoulder blade tucked down and toward your mid-back spine.  Hold for __________ seconds. Control the descent of your hand, as you slowly return to your starting position. Repeat __________ times. Complete this exercise __________ times per day.  STRENGTH - External Rotators  Secure a rubber exercise band or tubing to a fixed object (table, pole) so that it is at the same height as your right / left elbow when you are standing or sitting on a firm surface.  Stand or sit so that the secured exercise band is at your uninjured side.  Bend your right / left elbow 90 degrees. Place a folded towel or small pillow under your right / left arm, so that your elbow is a few inches away from your side.  Keeping the tension on the exercise band, pull it away from your body, as if pivoting on your elbow. Be sure to keep your body steady, so that the movement is coming only from your rotating shoulder.  Hold for __________ seconds. Release the tension in a controlled manner, as you return to the starting position. Repeat __________ times. Complete this exercise __________ times per day.  STRENGTH - Internal Rotators    Secure a rubber exercise band or tubing to a fixed object (table, pole) so that it is at the same height as your right / left elbow when you are standing or sitting on a firm surface.  Stand or sit so that the secured exercise band is at your right / left side.  Bend your elbow 90 degrees. Place a folded towel or small pillow under your right / left arm so that your elbow is a few inches away from your side.  Keeping the tension on the exercise band, pull it across your body, toward your stomach. Be sure to keep your body steady, so that the movement is coming only from your rotating shoulder.  Hold for __________ seconds. Release the tension in a controlled manner, as you return to the starting position. Repeat __________ times. Complete this exercise __________ times per day.  STRENGTH - Scapular Protractors, Standing   Stand arms length away from a wall. Place your hands on the wall, keeping your elbows straight.  Begin by dropping your shoulder blades down and toward your mid-back spine.  To strengthen your protractors, keep your shoulder blades down, but slide them forward on your rib cage. It will feel as if you are lifting the back of your rib cage away from the wall. This is a subtle motion and can be challenging to complete. Ask your caregiver for further instruction, if you are not sure you are doing the exercise correctly.  Hold for __________ seconds. Slowly return to the starting position, resting the muscles completely before starting the next repetition. Repeat __________ times. Complete this exercise __________ times per day. STRENGTH - Scapular Protractors, Supine  Lie on your back on a firm surface. Extend your right / left arm straight into the air while holding a __________ weight in your hand.  Keeping your head and back in place, lift your shoulder off the floor.  Hold for __________ seconds. Slowly return to the starting position, and allow your muscles to relax  completely before starting the next repetition. Repeat __________ times. Complete this exercise __________ times per day. STRENGTH - Scapular Protractors, Quadruped  Get onto your hands and knees, with your shoulders directly over your hands (or as close as you can be, comfortably).  Keeping your elbows locked, lift the back of your rib cage up into your shoulder blades, so your mid-back rounds out. Keep your neck muscles relaxed.  Hold this position for __________ seconds. Slowly return to the starting position and allow your muscles to relax completely before starting the next repetition. Repeat __________ times. Complete this exercise __________ times per day.  STRENGTH - Scapular Retractors  Secure a rubber exercise band or tubing to a fixed object (table, pole), so that it is at the height of your shoulders when you are either standing, or sitting on a firm armless chair.  With a palm down grip, grasp an end of the band in each hand. Straighten your elbows and lift your hands straight in front of you, at shoulder height. Step back, away from the secured end of the band, until it becomes tense.  Squeezing your shoulder blades together, draw your elbows back toward your sides, as you bend them. Keep your upper arms lifted away from your body throughout the exercise.  Hold for __________ seconds. Slowly ease the tension on the band, as you reverse the directions and return to the starting position. Repeat __________ times. Complete this exercise __________ times per day. STRENGTH - Shoulder Extensors   Secure a rubber exercise band or tubing to a fixed object (table, pole) so that it is at the height of your shoulders when you are either standing, or sitting on a firm armless chair.  With a thumbs-up grip, grasp an end of the band in each hand. Straighten your elbows and lift your hands straight in front of you, at shoulder height. Step back, away from the secured end of the band, until it  becomes tense.  Squeezing your shoulder blades together, pull your hands down to the sides of your thighs. Do not allow your hands to go behind you.  Hold for __________ seconds. Slowly ease the tension on the band, as you reverse the directions and return to the starting position. Repeat __________ times. Complete this exercise __________ times per day.  STRENGTH - Scapular Retractors and External Rotators   Secure a rubber exercise band or tubing to a fixed object (table, pole) so that it is at the height as your shoulders, when you are either standing, or sitting on a firm armless chair.  With a palm down grip, grasp an end of the band in each hand. Bend your elbows 90 degrees and lift your elbows to shoulder height, at your sides. Step back, away from the secured end of the band, until it becomes tense.  Squeezing your shoulder blades together, rotate your shoulders so that your upper arms and elbows remain stationary, but your fists travel upward to head height.  Hold for __________ seconds. Slowly ease the tension on the band, as you reverse the directions and return to the starting position. Repeat __________ times. Complete this exercise __________ times per day.  STRENGTH - Scapular Retractors and External Rotators, Rowing   Secure a rubber exercise band or tubing to a fixed object (table, pole) so that it is at the height of your shoulders, when you are either standing, or sitting on a firm armless chair.  With a palm down grip, grasp an end of the band in each hand. Straighten your elbows and lift your hands straight in front of you, at shoulder height. Step back, away from the secured end of the band, until it becomes tense.  Step 1: Squeeze your shoulder blades together. Bending your elbows,  draw your hands to your chest, as if you are rowing a boat. At the end of this motion, your hands and elbow should be at shoulder height and your elbows should be out to your sides.  Step 2:  Rotate your shoulders, to raise your hands above your head. Your forearms should be vertical and your upper arms should be horizontal.  Hold for __________ seconds. Slowly ease the tension on the band, as you reverse the directions and return to the starting position. Repeat __________ times. Complete this exercise __________ times per day.  STRENGTH  Scapular Depressors  Find a sturdy chair without wheels, such as a dining room chair.  Keeping your feet on the floor, and your hands on the chair arms, lift your bottom up from the seat, and lock your elbows.  Keeping your elbows straight, allow gravity to pull your body weight down. Your shoulders will rise toward your ears.  Raise your body against gravity by drawing your shoulder blades down your back, shortening the distance between your shoulders and ears. Although your feet should always maintain contact with the floor, your feet should progressively support less body weight, as you get stronger.  Hold for __________ seconds. In a controlled and slow manner, lower your body weight to begin the next repetition. Repeat __________ times. Complete this exercise __________ times per day.  Document Released: 06/25/2005 Document Revised: 09/17/2011 Document Reviewed: 10/07/2008 [facility] Patient Information 2014 [place], [place].

## 2013-08-14 NOTE — Progress Notes (Signed)
Pre-visit discussion using our clinic review tool. No additional management support is needed unless otherwise documented below in the visit note.  

## 2013-08-14 NOTE — Progress Notes (Signed)
   Subjective:    Patient ID: Erica Carlson, female    DOB: 1946/05/14, 68 y.o.   MRN: [id number]  Shoulder Pain  Pertinent negatives include no numbness.    Patient is here for L shoulder pain acknowledges recent overuse/activity but no injury   Past Medical History  Diagnosis Date  . Pneumonia   . Arthritis     osteo arthritis of right knee    Review of Systems  Constitutional: Negative for fatigue.  Respiratory: Negative for cough and shortness of breath.   Cardiovascular: Negative for chest pain and leg swelling.  Musculoskeletal: Positive for arthralgias. Negative for joint swelling, myalgias and neck pain.  Neurological: Negative for weakness and numbness.       Objective:   Physical Exam  BP 160/88  Pulse 71  Temp(Src) 98.4 F (36.9 C) (Oral)  Wt 193 lb 1.9 oz (87.599 kg)  SpO2 98% Wt Readings from Last 3 Encounters:  08/14/13 193 lb 1.9 oz (87.599 kg)  09/17/12 192 lb 4 oz (87.204 kg)  05/27/12 188 lb 8 oz (85.503 kg)    Constitutional: She appears well-developed and well-nourished. No distress.  Neck: Normal range of motion. Neck supple. No JVD present. No thyromegaly present.  Cardiovascular: Normal rate, regular rhythm and normal heart sounds.  No murmur heard. No BLE edema. Pulmonary/Chest: Effort normal and breath sounds normal. No respiratory distress. She has no wheezes.  MSkel: L Shoulder: Full range of motion. Neurovascularly intact distally. Good strength with stress of rotator cuff but causes pain. Positive impingement signs. Psychiatric: She has a normal mood and affect. Her behavior is normal. Judgment and thought content normal.   Lab Results  Component Value Date   WBC 5.7 04/23/2012   HGB 13.6 04/23/2012   HCT 42.5 04/23/2012   PLT 309.0 04/23/2012   GLUCOSE 81 04/23/2012   CHOL 194 07/07/2012   TRIG 145.0 07/07/2012   HDL 49.90 07/07/2012   LDLDIRECT 249.3 04/23/2012   LDLCALC 115* 07/07/2012   ALT 22 07/07/2012   AST 17  07/07/2012   NA 139 04/23/2012   K 4.0 04/23/2012   CL 105 04/23/2012   CREATININE 0.9 04/23/2012   BUN 13 04/23/2012   CO2 27 04/23/2012   TSH 0.98 04/23/2012    Mm Digital Screening  07/21/2013   CLINICAL DATA:  Screening.  EXAM: DIGITAL SCREENING BILATERAL MAMMOGRAM WITH CAD  COMPARISON:  Previous exam(s).  ACR Breast Density Category b: There are scattered areas of fibroglandular density.  FINDINGS: There are no findings suspicious for malignancy. Images were processed with CAD.  IMPRESSION: No mammographic evidence of malignancy. A result letter of this screening mammogram will be mailed directly to the patient.  RECOMMENDATION: Screening mammogram in one year. (Code:[id number]-01Y)  BI-RADS CATEGORY  1: Negative   Electronically Signed   By: Everlean Alstrom M.D.   On: 07/21/2013 10:37       Assessment & Plan:   left arm pain, shoulder to elbow Impingement on exam, suspect overuse  Will check plain film to look for DJD, spur  rx antiinflammatory for 2 weeks -diclofenac - erx done Home exercises prescribe Followup with primary care physician in next 2 weeks if unimproved, sooner if worse to consider steroid injection or referral as needed

## 2013-08-14 NOTE — Progress Notes (Signed)
Erica Carlson [id number] 08/14/2013   Chief Complaint  Patient presents with  . Shoulder Pain    Left    Subjective  Shoulder Pain  The pain is present in the left shoulder and left elbow. This is a new problem. The current episode started more than 1 month ago. There has been no history of extremity trauma. The problem occurs constantly. The problem has been unchanged. The quality of the pain is described as aching. The pain is at a severity of 7/10. Pertinent negatives include no fever, inability to bear weight, itching, joint swelling, limited range of motion, numbness, stiffness or tingling. She has tried acetaminophen for the symptoms. The treatment provided no relief. Family history does not include gout. Her past medical history is significant for osteoarthritis. There is no history of gout or rheumatoid arthritis.     Past Medical History  Diagnosis Date  . Pneumonia   . Arthritis     osteo arthritis of right knee    Past Surgical History  Procedure Laterality Date  . Abdominal hysterectomy    . Breast surgery      Family History  Problem Relation Age of Onset  . Breast cancer Sister     History  Substance Use Topics  . Smoking status: Never Smoker   . Smokeless tobacco: Never Used  . Alcohol Use: No    Current Outpatient Prescriptions on File Prior to Visit  Medication Sig Dispense Refill  . amLODipine (NORVASC) 10 MG tablet Take 1 tablet (10 mg total) by mouth daily.  30 tablet  11  . diphenhydrAMINE (BENADRYL) 25 mg capsule Take 25 mg by mouth as needed.      Marland Kitchen atorvastatin (LIPITOR) 20 MG tablet Take 1 tablet (20 mg total) by mouth daily.  30 tablet  11   No current facility-administered medications on file prior to visit.    Allergies: No Known Allergies  Review of Systems  Constitutional: Negative for fever, chills, weight loss and malaise/fatigue.  HENT: Negative for congestion, hearing loss, nosebleeds and tinnitus.   Eyes: Negative for  blurred vision, double vision and photophobia.  Respiratory: Negative for cough, shortness of breath and wheezing.   Cardiovascular: Negative for chest pain.  Gastrointestinal: Negative for heartburn, nausea and vomiting.  Musculoskeletal: Positive for back pain (Chronic - Degen. disk . lowerback) and joint pain (L. Shoulder, elbow). Negative for falls, gout, myalgias, neck pain and stiffness.  Skin: Negative for itching.  Neurological: Negative for tingling, speech change, numbness and headaches.  Endo/Heme/Allergies: Negative for environmental allergies. Does not bruise/bleed easily.  Psychiatric/Behavioral: Negative for depression and suicidal ideas. The patient is not nervous/anxious and does not have insomnia.        Objective  Filed Vitals:   08/14/13 1326  BP: 160/88  Pulse: 71  Temp: 98.4 F (36.9 C)  TempSrc: Oral  Weight: 193 lb 1.9 oz (87.599 kg)  SpO2: 98%    Physical Exam  Nursing note and vitals reviewed. Constitutional: She is oriented to person, place, and time. Vital signs are normal. She appears well-developed and well-nourished. No distress.  HENT:  Head: Normocephalic and atraumatic.  Eyes: Pupils are equal, round, and reactive to light.  Neck: No JVD present.  Cardiovascular: Normal rate, regular rhythm, normal heart sounds and intact distal pulses.  Exam reveals no gallop and no friction rub.   No murmur heard. Respiratory: Effort normal and breath sounds normal. No stridor. No respiratory distress. She has no wheezes. She exhibits no  tenderness.  Musculoskeletal:       Left shoulder: She exhibits decreased range of motion, tenderness and pain.       Left elbow: She exhibits no swelling. Tenderness found. Medial epicondyle tenderness noted.  Neurological: She is alert and oriented to person, place, and time. No cranial nerve deficit or sensory deficit.  Skin: She is not diaphoretic.  Psychiatric: She has a normal mood and affect. Her speech is normal and  behavior is normal. Judgment and thought content normal. Cognition and memory are normal.    BP Readings from Last 3 Encounters:  08/14/13 160/88  09/17/12 136/86  05/27/12 140/80    Wt Readings from Last 3 Encounters:  08/14/13 193 lb 1.9 oz (87.599 kg)  09/17/12 192 lb 4 oz (87.204 kg)  05/27/12 188 lb 8 oz (85.503 kg)    Lab Results  Component Value Date   WBC 5.7 04/23/2012   HGB 13.6 04/23/2012   HCT 42.5 04/23/2012   PLT 309.0 04/23/2012   GLUCOSE 81 04/23/2012   CHOL 194 07/07/2012   TRIG 145.0 07/07/2012   HDL 49.90 07/07/2012   LDLDIRECT 249.3 04/23/2012   LDLCALC 115* 07/07/2012   ALT 22 07/07/2012   AST 17 07/07/2012   NA 139 04/23/2012   K 4.0 04/23/2012   CL 105 04/23/2012   CREATININE 0.9 04/23/2012   BUN 13 04/23/2012   CO2 27 04/23/2012   TSH 0.98 04/23/2012    Assessment and Plan  Impingement Syndrome L- shoulder -- Pain on shouder abduction as well as tender to palpation on shoulder.  Patient recalls recent overuse of shoulder with child/yardwork.   No recent trauma reported.  No numbness or tingling in the hands.  Reduced strength the L arm.  Postive empty can test.  L. Shoulder X-ray ordered today to assess joint space.  Prescribed diclofenac for pain.    Hypertension-  Patient attributes high blood pressure to "white coat Syndrome" will defer to PCP for  management of patient's hypertension  Instructed patient to Notify her PCP if problems worsen or do not improve.  Donna Christen     I have personally reviewed this case with PA student. I also personally examined this patient. I agree with history and findings as documented above. I reviewed, discussed and approve of the assessment and plan as listed above. Gwendolyn Grant, MD

## 2013-09-16 ENCOUNTER — Telehealth: Payer: Self-pay | Admitting: Family Medicine

## 2013-09-16 DIAGNOSIS — E785 Hyperlipidemia, unspecified: Secondary | ICD-10-CM

## 2013-09-16 DIAGNOSIS — I10 Essential (primary) hypertension: Secondary | ICD-10-CM

## 2013-09-16 NOTE — Telephone Encounter (Signed)
Message copied by Abner Greenspan on Wed Sep 16, 2013 10:25 PM ------      Message from: Ellamae Sia      Created: Mon Sep 14, 2013  4:54 PM      Regarding: Lab orders for Thursday, 3.12.15       Patient is scheduled for CPX labs, please order future labs, Thanks , Terri       ------

## 2013-09-17 ENCOUNTER — Other Ambulatory Visit (INDEPENDENT_AMBULATORY_CARE_PROVIDER_SITE_OTHER): Payer: Medicare PPO

## 2013-09-17 DIAGNOSIS — E785 Hyperlipidemia, unspecified: Secondary | ICD-10-CM

## 2013-09-17 DIAGNOSIS — I10 Essential (primary) hypertension: Secondary | ICD-10-CM

## 2013-09-17 LAB — COMPREHENSIVE METABOLIC PANEL
ALK PHOS: 73 U/L (ref 39–117)
ALT: 17 U/L (ref 0–35)
AST: 13 U/L (ref 0–37)
Albumin: 4 g/dL (ref 3.5–5.2)
BILIRUBIN TOTAL: 0.2 mg/dL — AB (ref 0.3–1.2)
BUN: 19 mg/dL (ref 6–23)
CO2: 29 mEq/L (ref 19–32)
CREATININE: 0.9 mg/dL (ref 0.4–1.2)
Calcium: 9.3 mg/dL (ref 8.4–10.5)
Chloride: 109 mEq/L (ref 96–112)
GFR: 78.31 mL/min (ref >60.00)
GLUCOSE: 102 mg/dL — AB (ref 70–99)
Potassium: 3.9 mEq/L (ref 3.5–5.1)
SODIUM: 141 meq/L (ref 135–145)
TOTAL PROTEIN: 7.3 g/dL (ref 6.0–8.3)

## 2013-09-17 LAB — CBC WITH DIFFERENTIAL/PLATELET
BASOS ABS: 0 10*3/uL (ref 0.0–0.1)
Basophils Relative: 0.3 % (ref 0.0–3.0)
Eosinophils Absolute: 0.4 10*3/uL (ref 0.0–0.7)
Eosinophils Relative: 5.1 % — ABNORMAL HIGH (ref 0.0–5.0)
HEMATOCRIT: 39.4 % (ref 36.0–46.0)
Hemoglobin: 12.9 g/dL (ref 12.0–15.0)
LYMPHS ABS: 3 10*3/uL (ref 0.7–4.0)
Lymphocytes Relative: 41.6 % (ref 12.0–46.0)
MCHC: 32.8 g/dL (ref 30.0–36.0)
MCV: 81.5 fl (ref 78.0–100.0)
MONO ABS: 0.6 10*3/uL (ref 0.1–1.0)
Monocytes Relative: 7.8 % (ref 3.0–12.0)
Neutro Abs: 3.3 10*3/uL (ref 1.4–7.7)
Neutrophils Relative %: 45.2 % (ref 43.0–77.0)
PLATELETS: 319 10*3/uL (ref 150.0–400.0)
RBC: 4.83 Mil/uL (ref 3.87–5.11)
RDW: 13.9 % (ref 11.5–14.6)
WBC: 7.3 10*3/uL (ref 4.5–10.5)

## 2013-09-17 LAB — LIPID PANEL
CHOLESTEROL: 285 mg/dL — AB (ref 0–200)
HDL: 46.9 mg/dL (ref >39.00)
LDL CALC: 199 mg/dL — AB (ref 0–99)
Total CHOL/HDL Ratio: 6
Triglycerides: 196 mg/dL — ABNORMAL HIGH (ref 0.0–149.0)
VLDL: 39.2 mg/dL (ref 0.0–40.0)

## 2013-09-17 LAB — TSH: TSH: 1.12 u[IU]/mL (ref 0.35–5.50)

## 2013-09-25 ENCOUNTER — Encounter: Payer: Medicare PPO | Admitting: Family Medicine

## 2013-09-30 ENCOUNTER — Encounter: Payer: Self-pay | Admitting: Family Medicine

## 2013-09-30 ENCOUNTER — Ambulatory Visit (INDEPENDENT_AMBULATORY_CARE_PROVIDER_SITE_OTHER): Payer: Medicare PPO | Admitting: Family Medicine

## 2013-09-30 VITALS — BP 136/76 | HR 74 | Temp 98.5°F | Ht 62.0 in | Wt 192.0 lb

## 2013-09-30 DIAGNOSIS — E785 Hyperlipidemia, unspecified: Secondary | ICD-10-CM

## 2013-09-30 DIAGNOSIS — I10 Essential (primary) hypertension: Secondary | ICD-10-CM

## 2013-09-30 DIAGNOSIS — Z0000 Encounter for general adult medical examination without abnormal findings: Secondary | ICD-10-CM

## 2013-09-30 DIAGNOSIS — E669 Obesity, unspecified: Secondary | ICD-10-CM

## 2013-09-30 MED ORDER — AMLODIPINE BESYLATE 10 MG PO TABS: 10.0000 mg | ORAL_TABLET | Freq: Every day | ORAL | Status: DC

## 2013-09-30 MED ORDER — ATORVASTATIN CALCIUM 20 MG PO TABS: 20.0000 mg | ORAL_TABLET | Freq: Every day | ORAL | Status: DC

## 2013-09-30 NOTE — Progress Notes (Signed)
Subjective:    Patient ID: Erica Carlson, female    DOB: 1946-02-23, 68 y.o.   MRN: [id number]  HPI  I have personally reviewed the Medicare Annual Wellness questionnaire and have noted 1. The patient's medical and social history 2. Their use of alcohol, tobacco or illicit drugs 3. Their current medications and supplements 4. The patient's functional ability including ADL's, fall risks, home safety risks and hearing or visual             impairment. 5. Diet and physical activities 6. Evidence for depression or mood disorders  The patients weight, height, BMI have been recorded in the chart and visual acuity is per eye clinic.  I have made referrals, counseling and provided education to the patient based review of the above and I have provided the pt with a written personalized care plan for preventive services.  See scanned forms.  Routine anticipatory guidance given to patient.  See health maintenance. Colon cancer screening 9/09 - was nl - 10 year recall  ifob 2012 also normal  Breast cancer screening- mammogram 1/15  Self breast exam-no lumps  Flu vaccine 10/14 Tetanus vaccine 8/10  Pneumovax 6/12  Zoster vaccine - she has already had shingles but may be interested in the vaccine if insurance covers it   Advance directive- she does not have a living will  Cognitive function addressed- see scanned forms- and if abnormal then additional documentation follows. -no major memory problems   PMH and SH reviewed  Meds, vitals, and allergies reviewed.   ROS: See HPI.  Otherwise negative.    Wt is stable with bmi of 35 Obese Not as much exercise as she used to get - just building a new gym by her house with a pool- she is interested in water aerobics (chronic back issues)   bp is stable today  No cp or palpitations or headaches or edema  No side effects to medicines  BP Readings from Last 3 Encounters:  09/30/13 136/76  08/14/13 160/88  09/17/12 136/86       Hyperlipidemia  Lab Results  Component Value Date   CHOL 285* 09/17/2013   CHOL 194 07/07/2012   CHOL 325* 04/23/2012   Lab Results  Component Value Date   HDL 46.90 09/17/2013   HDL 49.90 07/07/2012   HDL 50.20 04/23/2012   Lab Results  Component Value Date   LDLCALC 199* 09/17/2013   LDLCALC 115* 07/07/2012   Lab Results  Component Value Date   TRIG 196.0* 09/17/2013   TRIG 145.0 07/07/2012   TRIG 172.0* 04/23/2012   Lab Results  Component Value Date   CHOLHDL 6 09/17/2013   CHOLHDL 4 07/07/2012   CHOLHDL 6 04/23/2012   Lab Results  Component Value Date   LDLDIRECT 249.3 04/23/2012   LDLDIRECT 217.7 12/11/2010   LDLDIRECT 204.1 03/03/2009    Cholesterol is much worse-she thinks her diet is not as good- she started eating fried foods again  She never took the lipitor -does not really know why   Results for orders placed in visit on 09/17/13  CBC WITH DIFFERENTIAL      Result Value Ref Range   WBC 7.3  4.5 - 10.5 K/uL   RBC 4.83  3.87 - 5.11 Mil/uL   Hemoglobin 12.9  12.0 - 15.0 g/dL   HCT 39.4  36.0 - 46.0 %   MCV 81.5  78.0 - 100.0 fl   MCHC 32.8  30.0 - 36.0 g/dL   RDW  13.9  11.5 - 14.6 %   Platelets 319.0  150.0 - 400.0 K/uL   Neutrophils Relative % 45.2  43.0 - 77.0 %   Lymphocytes Relative 41.6  12.0 - 46.0 %   Monocytes Relative 7.8  3.0 - 12.0 %   Eosinophils Relative 5.1 (*) 0.0 - 5.0 %   Basophils Relative 0.3  0.0 - 3.0 %   Neutro Abs 3.3  1.4 - 7.7 K/uL   Lymphs Abs 3.0  0.7 - 4.0 K/uL   Monocytes Absolute 0.6  0.1 - 1.0 K/uL   Eosinophils Absolute 0.4  0.0 - 0.7 K/uL   Basophils Absolute 0.0  0.0 - 0.1 K/uL  COMPREHENSIVE METABOLIC PANEL      Result Value Ref Range   Sodium 141  135 - 145 mEq/L   Potassium 3.9  3.5 - 5.1 mEq/L   Chloride 109  96 - 112 mEq/L   CO2 29  19 - 32 mEq/L   Glucose, Bld 102 (*) 70 - 99 mg/dL   BUN 19  6 - 23 mg/dL   Creatinine, Ser 0.9  0.4 - 1.2 mg/dL   Total Bilirubin 0.2 (*) 0.3 - 1.2 mg/dL   Alkaline  Phosphatase 73  39 - 117 U/L   AST 13  0 - 37 U/L   ALT 17  0 - 35 U/L   Total Protein 7.3  6.0 - 8.3 g/dL   Albumin 4.0  3.5 - 5.2 g/dL   Calcium 9.3  8.4 - 10.5 mg/dL   GFR 78.31  >60.00 mL/min  LIPID PANEL      Result Value Ref Range   Cholesterol 285 (*) 0 - 200 mg/dL   Triglycerides 196.0 (*) 0.0 - 149.0 mg/dL   HDL 46.90  >39.00 mg/dL   VLDL 39.2  0.0 - 40.0 mg/dL   LDL Cholesterol 199 (*) 0 - 99 mg/dL   Total CHOL/HDL Ratio 6    TSH      Result Value Ref Range   TSH 1.12  0.35 - 5.50 uIU/mL    Patient Active Problem List   Diagnosis Date Noted  . Encounter for Medicare annual wellness exam 09/30/2013  . Estrogen deficiency 09/17/2012  . Special screening for malignant neoplasms, colon 12/15/2010  . Other screening mammogram 12/15/2010  . Routine general medical examination at a health care facility 12/08/2010  . BACK PAIN WITH RADICULOPATHY 09/06/2010  . OSTEOARTHRITIS, KNEE, RIGHT 10/11/2008  . HEMORRHOIDS 09/02/2008  . Hypertension 04/28/2008  . PNEUMONIA, HX OF 03/03/2008  . HYPERLIPIDEMIA 02/26/2008   Past Medical History  Diagnosis Date  . Pneumonia   . Arthritis     osteo arthritis of right knee   Past Surgical History  Procedure Laterality Date  . Abdominal hysterectomy    . Breast surgery     History  Substance Use Topics  . Smoking status: Never Smoker   . Smokeless tobacco: Never Used  . Alcohol Use: No   Family History  Problem Relation Age of Onset  . Breast cancer Sister    No Known Allergies Current Outpatient Prescriptions on File Prior to Visit  Medication Sig Dispense Refill  . diphenhydrAMINE (BENADRYL) 25 mg capsule Take 25 mg by mouth as needed.       No current facility-administered medications on file prior to visit.    Review of Systems Review of Systems  Constitutional: Negative for fever, appetite change, fatigue and unexpected weight change.  Eyes: Negative for pain and visual disturbance.  Respiratory:  Negative for  cough and shortness of breath.   Cardiovascular: Negative for cp or palpitations    Gastrointestinal: Negative for nausea, diarrhea and constipation.  Genitourinary: Negative for urgency and frequency.  Skin: Negative for pallor or rash   Neurological: Negative for weakness, light-headedness, numbness and headaches.  Hematological: Negative for adenopathy. Does not bruise/bleed easily.  Psychiatric/Behavioral: Negative for dysphoric mood. The patient is not nervous/anxious.         Objective:   Physical Exam  Constitutional: She appears well-developed and well-nourished. No distress.  obese and well appearing   HENT:  Head: Normocephalic and atraumatic.  Right Ear: External ear normal.  Left Ear: External ear normal.  Mouth/Throat: Oropharynx is clear and moist.  Eyes: Conjunctivae and EOM are normal. Pupils are equal, round, and reactive to light. No scleral icterus.  Neck: Normal range of motion. Neck supple. No JVD present. Carotid bruit is not present. No thyromegaly present.  Cardiovascular: Normal rate, regular rhythm, normal heart sounds and intact distal pulses.  Exam reveals no gallop.   Pulmonary/Chest: Effort normal and breath sounds normal. No respiratory distress. She has no wheezes. She exhibits no tenderness.  Abdominal: Soft. Bowel sounds are normal. She exhibits no distension, no abdominal bruit and no mass. There is no tenderness.  Genitourinary: No breast swelling, tenderness, discharge or bleeding.  Breast exam: No mass, nodules, thickening, tenderness, bulging, retraction, inflamation, nipple discharge or skin changes noted.  No axillary or clavicular LA.      Musculoskeletal: Normal range of motion. She exhibits no edema and no tenderness.  Lymphadenopathy:    She has no cervical adenopathy.  Neurological: She is alert. She has normal reflexes. No cranial nerve deficit. She exhibits normal muscle tone. Coordination normal.  Skin: Skin is warm and dry. No rash  noted. No erythema. No pallor.  Psychiatric: She has a normal mood and affect.          Assessment & Plan:

## 2013-09-30 NOTE — Patient Instructions (Addendum)
If you are interested in a shingles/zoster vaccine - call your insurance to check on coverage,( you should not get it within 1 month of other vaccines) , then call us for a prescription  for it to take to a pharmacy that gives the shot , or make a nurse visit to get it here depending on your coverage  Look into writing up an advanced directive/living will Get started on generic lipitor (atorvastatin) Schedule fasting labs in 6 weeks for cholesterol   Fat and Cholesterol Control Diet Fat and cholesterol levels in your blood and organs are influenced by your diet. High levels of fat and cholesterol may lead to diseases of the heart, small and large blood vessels, gallbladder, liver, and pancreas. CONTROLLING FAT AND CHOLESTEROL WITH DIET Although exercise and lifestyle factors are important, your diet is key. That is because certain foods are known to raise cholesterol and others to lower it. The goal is to balance foods for their effect on cholesterol and more importantly, to replace saturated and trans fat with other types of fat, such as monounsaturated fat, polyunsaturated fat, and omega-3 fatty acids. On average, a person should consume no more than 15 to 17 g of saturated fat daily. Saturated and trans fats are considered "bad" fats, and they will raise LDL cholesterol. Saturated fats are primarily found in animal products such as meats, butter, and cream. However, that does not mean you need to give up all your favorite foods. Today, there are good tasting, low-fat, low-cholesterol substitutes for most of the things you like to eat. Choose low-fat or nonfat alternatives. Choose round or loin cuts of red meat. These types of cuts are lowest in fat and cholesterol. Chicken (without the skin), fish, veal, and ground [place] breast are great choices. Eliminate fatty meats, such as hot dogs and salami. Even shellfish have little or no saturated fat. Have a 3 oz (85 g) portion when you eat lean meat,  poultry, or fish. Trans fats are also called "partially hydrogenated oils." They are oils that have been scientifically manipulated so that they are solid at room temperature resulting in a longer shelf life and improved taste and texture of foods in which they are added. Trans fats are found in stick margarine, some tub margarines, cookies, crackers, and baked goods.  When baking and cooking, oils are a great substitute for butter. The monounsaturated oils are especially beneficial since it is believed they lower LDL and raise HDL. The oils you should avoid entirely are saturated tropical oils, such as coconut and palm.  Remember to eat a lot from food groups that are naturally free of saturated and trans fat, including fish, fruit, vegetables, beans, grains (barley, rice, couscous, bulgur wheat), and pasta (without cream sauces).  IDENTIFYING FOODS THAT LOWER FAT AND CHOLESTEROL  Soluble fiber may lower your cholesterol. This type of fiber is found in fruits such as apples, vegetables such as broccoli, potatoes, and carrots, legumes such as beans, peas, and lentils, and grains such as barley. Foods fortified with plant sterols (phytosterol) may also lower cholesterol. You should eat at least 2 g per day of these foods for a cholesterol lowering effect.  Read package labels to identify low-saturated fats, trans fat free, and low-fat foods at the supermarket. Select cheeses that have only 2 to 3 g saturated fat per ounce. Use a heart-healthy tub margarine that is free of trans fats or partially hydrogenated oil. When buying baked goods (cookies, crackers), avoid partially hydrogenated oils. Breads  and muffins should be made from whole grains (whole-wheat or whole oat flour, instead of "flour" or "enriched flour"). Buy non-creamy canned soups with reduced salt and no added fats.  FOOD PREPARATION TECHNIQUES  Never deep-fry. If you must fry, either stir-fry, which uses very little fat, or use non-stick cooking  sprays. When possible, broil, bake, or roast meats, and steam vegetables. Instead of putting butter or margarine on vegetables, use lemon and herbs, applesauce, and cinnamon (for squash and sweet potatoes). Use nonfat yogurt, salsa, and low-fat dressings for salads.  LOW-SATURATED FAT / LOW-FAT FOOD SUBSTITUTES Meats / Saturated Fat (g)  Avoid: Steak, marbled (3 oz/85 g) / 11 g  Choose: Steak, lean (3 oz/85 g) / 4 g  Avoid: Hamburger (3 oz/85 g) / 7 g  Choose: Hamburger, lean (3 oz/85 g) / 5 g  Avoid: Ham (3 oz/85 g) / 6 g  Choose: Ham, lean cut (3 oz/85 g) / 2.4 g  Avoid: Chicken, with skin, dark meat (3 oz/85 g) / 4 g  Choose: Chicken, skin removed, dark meat (3 oz/85 g) / 2 g  Avoid: Chicken, with skin, light meat (3 oz/85 g) / 2.5 g  Choose: Chicken, skin removed, light meat (3 oz/85 g) / 1 g Dairy / Saturated Fat (g)  Avoid: Whole milk (1 cup) / 5 g  Choose: Low-fat milk, 2% (1 cup) / 3 g  Choose: Low-fat milk, 1% (1 cup) / 1.5 g  Choose: Skim milk (1 cup) / 0.3 g  Avoid: Hard cheese (1 oz/28 g) / 6 g  Choose: Skim milk cheese (1 oz/28 g) / 2 to 3 g  Avoid: Cottage cheese, 4% fat (1 cup) / 6.5 g  Choose: Low-fat cottage cheese, 1% fat (1 cup) / 1.5 g  Avoid: Ice cream (1 cup) / 9 g  Choose: Sherbet (1 cup) / 2.5 g  Choose: Nonfat frozen yogurt (1 cup) / 0.3 g  Choose: Frozen fruit bar / trace  Avoid: Whipped cream (1 tbs) / 3.5 g  Choose: Nondairy whipped topping (1 tbs) / 1 g Condiments / Saturated Fat (g)  Avoid: Mayonnaise (1 tbs) / 2 g  Choose: Low-fat mayonnaise (1 tbs) / 1 g  Avoid: Butter (1 tbs) / 7 g  Choose: Extra light margarine (1 tbs) / 1 g  Avoid: Coconut oil (1 tbs) / 11.8 g  Choose: Olive oil (1 tbs) / 1.8 g  Choose: Corn oil (1 tbs) / 1.7 g  Choose: Safflower oil (1 tbs) / 1.2 g  Choose: Sunflower oil (1 tbs) / 1.4 g  Choose: Soybean oil (1 tbs) / 2.4 g  Choose: Canola oil (1 tbs) / 1 g Document Released: 06/25/2005  Document Revised: 10/20/2012 Document Reviewed: 12/14/2010 [employer] Patient Information 2014 [place], [place].

## 2013-09-30 NOTE — Progress Notes (Signed)
Pre visit review using our clinic review tool, if applicable. No additional management support is needed unless otherwise documented below in the visit note. 

## 2013-10-01 ENCOUNTER — Telehealth: Payer: Self-pay | Admitting: Family Medicine

## 2013-10-01 DIAGNOSIS — E669 Obesity, unspecified: Secondary | ICD-10-CM | POA: Insufficient documentation

## 2013-10-01 NOTE — Assessment & Plan Note (Signed)
Lipids are high For ? Reasons-pt never started lipitor-so she will do so now  Disc goals for lipids and reasons to control them Rev labs with pt Rev low sat fat diet in detail  Given handout on diet  Re check in 6 weeks fasting with ast/alst

## 2013-10-01 NOTE — Assessment & Plan Note (Signed)
Reviewed health habits including diet and exercise and skin cancer prevention Reviewed appropriate screening tests for age  Also reviewed health mt list, fam hx and immunization status , as well as social and family history   See HPI Pt will check on zostavax coverage

## 2013-10-01 NOTE — Telephone Encounter (Signed)
Relevant patient education assigned to patient using Emmi. ° °

## 2013-10-01 NOTE — Assessment & Plan Note (Signed)
Discussed how this problem influences overall health and the risks it imposes  Reviewed plan for weight loss with lower calorie diet (via better food choices and also portion control or program like weight watchers) and exercise building up to or more than 30 minutes 5 days per week including some aerobic activity    

## 2013-10-01 NOTE — Assessment & Plan Note (Signed)
bp in fair control at this time  BP Readings from Last 1 Encounters:  09/30/13 136/76   No changes needed Disc lifstyle change with low sodium diet and exercise   Labs reviewed

## 2013-11-04 ENCOUNTER — Telehealth: Payer: Self-pay

## 2013-11-04 NOTE — Telephone Encounter (Signed)
Please stay off of it  For now - I do not want to try another statin yet Please ask her to find out if her insurance covers a med called zetia (for cholesterol)- and let me know-thanks

## 2013-11-04 NOTE — Telephone Encounter (Signed)
Left voicemail requesting pt to call office 

## 2013-11-04 NOTE — Telephone Encounter (Signed)
Pt left v/m; pt said pt was started on atorvastatin and while pt was taking atorvastatin pt could not get out of bed;pt ached in back and legs. Pt stopped med 2 days ago and now feels OK. Pt cannot take atorvastatin. Pt request cb.Programmer, systems.

## 2013-11-06 NOTE — Telephone Encounter (Signed)
Spoke with pt and instructed as below.  She will call our office back once she talks to [facility].

## 2013-11-10 NOTE — Telephone Encounter (Signed)
Please watch diet as carefully as you can (Avoid red meat/ fried foods/ egg yolks/ fatty breakfast meats/ butter, cheese and high fat dairy/ and shellfish ) Lets re check fasting lipid in 2-3 mo and f/u after , thanks

## 2013-11-10 NOTE — Telephone Encounter (Signed)
Pt left v/m Zetia is covered by ins but since not generic cost to pt will still be high. Pt request cb.

## 2013-11-10 NOTE — Telephone Encounter (Signed)
If that is not affordable -for now watch diet carefully (Avoid red meat/ fried foods/ egg yolks/ fatty breakfast meats/ butter, cheese and high fat dairy/ and shellfish )

## 2013-11-10 NOTE — Telephone Encounter (Signed)
Pt advise since Rx was to expensive to just watch diet for now. Pt verbalized understanding. Pt wanted to know does she need to come back for labs anytime soon since she isn't on any Rx for cholesterol

## 2013-11-12 ENCOUNTER — Other Ambulatory Visit: Payer: Medicare PPO

## 2013-11-19 ENCOUNTER — Encounter: Payer: Self-pay | Admitting: *Deleted

## 2013-11-19 NOTE — Telephone Encounter (Signed)
appt scheduled and pt notified

## 2014-02-23 ENCOUNTER — Encounter (INDEPENDENT_AMBULATORY_CARE_PROVIDER_SITE_OTHER): Payer: Self-pay

## 2014-02-23 ENCOUNTER — Other Ambulatory Visit (INDEPENDENT_AMBULATORY_CARE_PROVIDER_SITE_OTHER): Payer: Medicare PPO

## 2014-02-23 DIAGNOSIS — E785 Hyperlipidemia, unspecified: Secondary | ICD-10-CM

## 2014-02-23 LAB — LIPID PANEL
CHOL/HDL RATIO: 6
CHOLESTEROL: 282 mg/dL — AB (ref 0–200)
HDL: 46.5 mg/dL (ref >39.00)
NONHDL: 235.5
Triglycerides: 204 mg/dL — ABNORMAL HIGH (ref 0.0–149.0)
VLDL: 40.8 mg/dL — ABNORMAL HIGH (ref 0.0–40.0)

## 2014-02-23 LAB — ALT: ALT: 17 U/L (ref 0–35)

## 2014-02-23 LAB — AST: AST: 12 U/L (ref 0–37)

## 2014-02-23 LAB — LDL CHOLESTEROL, DIRECT: LDL DIRECT: 204.9 mg/dL

## 2014-03-02 ENCOUNTER — Ambulatory Visit: Payer: Medicare PPO | Admitting: Family Medicine

## 2014-03-05 ENCOUNTER — Ambulatory Visit (INDEPENDENT_AMBULATORY_CARE_PROVIDER_SITE_OTHER): Payer: Medicare PPO | Admitting: Family Medicine

## 2014-03-05 ENCOUNTER — Encounter: Payer: Self-pay | Admitting: Family Medicine

## 2014-03-05 VITALS — BP 166/100 | HR 68 | Temp 98.1°F | Ht 62.0 in | Wt 185.2 lb

## 2014-03-05 DIAGNOSIS — E785 Hyperlipidemia, unspecified: Secondary | ICD-10-CM

## 2014-03-05 DIAGNOSIS — IMO0002 Reserved for concepts with insufficient information to code with codable children: Secondary | ICD-10-CM

## 2014-03-05 DIAGNOSIS — I10 Essential (primary) hypertension: Secondary | ICD-10-CM

## 2014-03-05 MED ORDER — SIMVASTATIN 20 MG PO TABS
20.0000 mg | ORAL_TABLET | Freq: Every day | ORAL | Status: DC
Start: 2014-03-05 — End: 2014-10-19

## 2014-03-05 NOTE — Patient Instructions (Addendum)
Try simvastatin (zocor)  If leg pain stop it  Keep up the good work with diet and weight loss  (Avoid red meat/ fried foods/ egg yolks/ fatty breakfast meats/ butter, cheese and high fat dairy/ and shellfish ) Continue water exercise Check your blood pressure when you are in less pain -let me know next week how it is   Schedule fasting labs in 6 weeks for cholesterol  If you end up having to stop the simvastatin - then cancel that     Fat and Cholesterol Control Diet Fat and cholesterol levels in your blood and organs are influenced by your diet. High levels of fat and cholesterol may lead to diseases of the heart, small and large blood vessels, gallbladder, liver, and pancreas. CONTROLLING FAT AND CHOLESTEROL WITH DIET Although exercise and lifestyle factors are important, your diet is key. That is because certain foods are known to raise cholesterol and others to lower it. The goal is to balance foods for their effect on cholesterol and more importantly, to replace saturated and trans fat with other types of fat, such as monounsaturated fat, polyunsaturated fat, and omega-3 fatty acids. On average, a person should consume no more than 15 to 17 g of saturated fat daily. Saturated and trans fats are considered "bad" fats, and they will raise LDL cholesterol. Saturated fats are primarily found in animal products such as meats, butter, and cream. However, that does not mean you need to give up all your favorite foods. Today, there are good tasting, low-fat, low-cholesterol substitutes for most of the things you like to eat. Choose low-fat or nonfat alternatives. Choose round or loin cuts of red meat. These types of cuts are lowest in fat and cholesterol. Chicken (without the skin), fish, veal, and ground [place] breast are great choices. Eliminate fatty meats, such as hot dogs and salami. Even shellfish have little or no saturated fat. Have a 3 oz (85 g) portion when you eat lean meat, poultry, or  fish. Trans fats are also called "partially hydrogenated oils." They are oils that have been scientifically manipulated so that they are solid at room temperature resulting in a longer shelf life and improved taste and texture of foods in which they are added. Trans fats are found in stick margarine, some tub margarines, cookies, crackers, and baked goods.  When baking and cooking, oils are a great substitute for butter. The monounsaturated oils are especially beneficial since it is believed they lower LDL and raise HDL. The oils you should avoid entirely are saturated tropical oils, such as coconut and palm.  Remember to eat a lot from food groups that are naturally free of saturated and trans fat, including fish, fruit, vegetables, beans, grains (barley, rice, couscous, bulgur wheat), and pasta (without cream sauces).  IDENTIFYING FOODS THAT LOWER FAT AND CHOLESTEROL  Soluble fiber may lower your cholesterol. This type of fiber is found in fruits such as apples, vegetables such as broccoli, potatoes, and carrots, legumes such as beans, peas, and lentils, and grains such as barley. Foods fortified with plant sterols (phytosterol) may also lower cholesterol. You should eat at least 2 g per day of these foods for a cholesterol lowering effect.  Read package labels to identify low-saturated fats, trans fat free, and low-fat foods at the supermarket. Select cheeses that have only 2 to 3 g saturated fat per ounce. Use a heart-healthy tub margarine that is free of trans fats or partially hydrogenated oil. When buying baked goods (cookies, crackers), avoid  partially hydrogenated oils. Breads and muffins should be made from whole grains (whole-wheat or whole oat flour, instead of "flour" or "enriched flour"). Buy non-creamy canned soups with reduced salt and no added fats.  FOOD PREPARATION TECHNIQUES  Never deep-fry. If you must fry, either stir-fry, which uses very little fat, or use non-stick cooking sprays.  When possible, broil, bake, or roast meats, and steam vegetables. Instead of putting butter or margarine on vegetables, use lemon and herbs, applesauce, and cinnamon (for squash and sweet potatoes). Use nonfat yogurt, salsa, and low-fat dressings for salads.  LOW-SATURATED FAT / LOW-FAT FOOD SUBSTITUTES Meats / Saturated Fat (g)  Avoid: Steak, marbled (3 oz/85 g) / 11 g  Choose: Steak, lean (3 oz/85 g) / 4 g  Avoid: Hamburger (3 oz/85 g) / 7 g  Choose: Hamburger, lean (3 oz/85 g) / 5 g  Avoid: Ham (3 oz/85 g) / 6 g  Choose: Ham, lean cut (3 oz/85 g) / 2.4 g  Avoid: Chicken, with skin, dark meat (3 oz/85 g) / 4 g  Choose: Chicken, skin removed, dark meat (3 oz/85 g) / 2 g  Avoid: Chicken, with skin, light meat (3 oz/85 g) / 2.5 g  Choose: Chicken, skin removed, light meat (3 oz/85 g) / 1 g Dairy / Saturated Fat (g)  Avoid: Whole milk (1 cup) / 5 g  Choose: Low-fat milk, 2% (1 cup) / 3 g  Choose: Low-fat milk, 1% (1 cup) / 1.5 g  Choose: Skim milk (1 cup) / 0.3 g  Avoid: Hard cheese (1 oz/28 g) / 6 g  Choose: Skim milk cheese (1 oz/28 g) / 2 to 3 g  Avoid: Cottage cheese, 4% fat (1 cup) / 6.5 g  Choose: Low-fat cottage cheese, 1% fat (1 cup) / 1.5 g  Avoid: Ice cream (1 cup) / 9 g  Choose: Sherbet (1 cup) / 2.5 g  Choose: Nonfat frozen yogurt (1 cup) / 0.3 g  Choose: Frozen fruit bar / trace  Avoid: Whipped cream (1 tbs) / 3.5 g  Choose: Nondairy whipped topping (1 tbs) / 1 g Condiments / Saturated Fat (g)  Avoid: Mayonnaise (1 tbs) / 2 g  Choose: Low-fat mayonnaise (1 tbs) / 1 g  Avoid: Butter (1 tbs) / 7 g  Choose: Extra light margarine (1 tbs) / 1 g  Avoid: Coconut oil (1 tbs) / 11.8 g  Choose: Olive oil (1 tbs) / 1.8 g  Choose: Corn oil (1 tbs) / 1.7 g  Choose: Safflower oil (1 tbs) / 1.2 g  Choose: Sunflower oil (1 tbs) / 1.4 g  Choose: Soybean oil (1 tbs) / 2.4 g  Choose: Canola oil (1 tbs) / 1 g Document Released: 06/25/2005 Document  Revised: 10/20/2012 Document Reviewed: 09/23/2013 [employer] Patient Information 2015 [place], [place]. This information is not intended to replace advice given to you by your health care provider. Make sure you discuss any questions you have with your health care provider.

## 2014-03-05 NOTE — Progress Notes (Signed)
Pre visit review using our clinic review tool, if applicable. No additional management support is needed unless otherwise documented below in the visit note. 

## 2014-03-05 NOTE — Assessment & Plan Note (Signed)
Intol of atorvastatin Cannot afford crestor Disc goals for lipids and reasons to control them Rev labs with pt Rev low sat fat diet in detail  Still very high despite good lifestyle effort  Will try simvastatin low dose 20 - if similar side eff (leg pain)-inst to stop it and let us know Otherwise continue low sat fat diet and check lab in 6 wk

## 2014-03-05 NOTE — Assessment & Plan Note (Signed)
bp is up today  Pt thinks this is due to pain Has been fine at home  inst to check bp at home and update [place] Monday (if no imp over the weekend will call nurse line)

## 2014-03-05 NOTE — Assessment & Plan Note (Signed)
Ongoing  Not much help with inj from Dr Nelva Bush  Now seeing chiropractor and going to water aerobics -this is more helpful  She has pain today from walking more than usual - plans to go home and put ice on her back and see chiropractor  Will update if no imp over the weekend

## 2014-03-05 NOTE — Progress Notes (Signed)
Subjective:    Patient ID: Erica Carlson, female    DOB: 10-08-1945, 68 y.o.   MRN: [id number]  HPI Here for f/u of chronic medical problems   Her sciatica is acting up today-had to walk a lot for jury duty  Dr Nelva Bush did injections- was supposed to refer her to another doctor/specialist  She has gone to a chiropractor -and that has helped (also therapy and water aerobics) Just a bad day for her with pain   bp is up today - pt thinks due to pain BP Readings from Last 3 Encounters:  03/05/14 166/100  09/30/13 136/76  08/14/13 160/88   at home has been 130/80s when she checks twice per week   Cholesterol  We px lipitor - had side eff of bad leg pain - generic lipitor Suggested crestor-that was too $$$  Lab Results  Component Value Date   CHOL 282* 02/23/2014   CHOL 285* 09/17/2013   CHOL 194 07/07/2012   Lab Results  Component Value Date   HDL 46.50 02/23/2014   HDL 46.90 09/17/2013   HDL 49.90 07/07/2012   Lab Results  Component Value Date   LDLCALC 199* 09/17/2013   LDLCALC 115* 07/07/2012   Lab Results  Component Value Date   TRIG 204.0* 02/23/2014   TRIG 196.0* 09/17/2013   TRIG 145.0 07/07/2012   Lab Results  Component Value Date   CHOLHDL 6 02/23/2014   CHOLHDL 6 09/17/2013   CHOLHDL 4 07/07/2012   Lab Results  Component Value Date   LDLDIRECT 204.9 02/23/2014   LDLDIRECT 249.3 04/23/2012   LDLDIRECT 217.7 12/11/2010   still very high  She has changed her eating habits  No oil at all except for occ olive oil (rarely) No fried foods  Red meat once per week  Rare shellfish  No high fat dairy- avoids cheese and other things   Patient Active Problem List   Diagnosis Date Noted  . Obesity (BMI 30-39.9) 10/01/2013  . Encounter for Medicare annual wellness exam 09/30/2013  . Estrogen deficiency 09/17/2012  . Special screening for malignant neoplasms, colon 12/15/2010  . Other screening mammogram 12/15/2010  . BACK PAIN WITH RADICULOPATHY 09/06/2010  .  OSTEOARTHRITIS, KNEE, RIGHT 10/11/2008  . HEMORRHOIDS 09/02/2008  . Hypertension 04/28/2008  . PNEUMONIA, HX OF 03/03/2008  . HYPERLIPIDEMIA 02/26/2008   Past Medical History  Diagnosis Date  . Pneumonia   . Arthritis     osteo arthritis of right knee   Past Surgical History  Procedure Laterality Date  . Abdominal hysterectomy    . Breast surgery     History  Substance Use Topics  . Smoking status: Never Smoker   . Smokeless tobacco: Never Used  . Alcohol Use: No   Family History  Problem Relation Age of Onset  . Breast cancer Sister    Allergies  Allergen Reactions  . Atorvastatin Other (See Comments)    Ache in back and legs   Current Outpatient Prescriptions on File Prior to Visit  Medication Sig Dispense Refill  . amLODipine (NORVASC) 10 MG tablet Take 1 tablet (10 mg total) by mouth daily.  30 tablet  11  . diphenhydrAMINE (BENADRYL) 25 mg capsule Take 25 mg by mouth as needed.      Marland Kitchen ibuprofen (ADVIL,MOTRIN) 200 MG tablet Take 200 mg by mouth every 6 (six) hours as needed.       No current facility-administered medications on file prior to visit.     Review of  Systems Review of Systems  Constitutional: Negative for fever, appetite change, fatigue and unexpected weight change.  Eyes: Negative for pain and visual disturbance.  Respiratory: Negative for cough and shortness of breath.   Cardiovascular: Negative for cp or palpitations    Gastrointestinal: Negative for nausea, diarrhea and constipation.  Genitourinary: Negative for urgency and frequency.  Skin: Negative for pallor or rash   Neurological: Negative for weakness, light-headedness, numbness and headaches.  Hematological: Negative for adenopathy. Does not bruise/bleed easily.  Psychiatric/Behavioral: Negative for dysphoric mood. The patient is not nervous/anxious.         Objective:   Physical Exam  Constitutional: She appears well-developed and well-nourished. No distress.  obese and well  appearing     HENT:  Head: Normocephalic and atraumatic.  Mouth/Throat: Oropharynx is clear and moist.  Eyes: Conjunctivae and EOM are normal. Pupils are equal, round, and reactive to light. Right eye exhibits no discharge. Left eye exhibits no discharge. No scleral icterus.  Neck: Normal range of motion. Neck supple. No JVD present. Carotid bruit is not present. No thyromegaly present.  Cardiovascular: Normal rate, regular rhythm, normal heart sounds and intact distal pulses.  Exam reveals no gallop.   Pulmonary/Chest: Effort normal and breath sounds normal. No respiratory distress. She has no wheezes. She has no rales.  Abdominal: She exhibits no mass.  Musculoskeletal: She exhibits no edema and no tenderness.  Poor rom LS  Walking is labored  No foot drop  LS is diffusely tender  Lymphadenopathy:    She has no cervical adenopathy.  Neurological: She is alert. She has normal reflexes. No cranial nerve deficit. She exhibits normal muscle tone. Coordination normal.  Skin: Skin is warm and dry. No rash noted. No erythema. No pallor.  Psychiatric: She has a normal mood and affect.          Assessment & Plan:   Problem List Items Addressed This Visit     Cardiovascular and Mediastinum   Hypertension - Primary     bp is up today  Pt thinks this is due to pain Has been fine at home  inst to check bp at home and update [place] Monday (if no imp over the weekend will call nurse line)    Relevant Medications      simvastatin (ZOCOR) tablet     Nervous and Auditory   BACK PAIN WITH RADICULOPATHY     Ongoing  Not much help with inj from Dr Nelva Bush  Now seeing chiropractor and going to water aerobics -this is more helpful  She has pain today from walking more than usual - plans to go home and put ice on her back and see chiropractor  Will update if no imp over the weekend       Other   HYPERLIPIDEMIA     Intol of atorvastatin Cannot afford crestor Disc goals for lipids and reasons  to control them Rev labs with pt Rev low sat fat diet in detail  Still very high despite good lifestyle effort  Will try simvastatin low dose 20 - if similar side eff (leg pain)-inst to stop it and let us know Otherwise continue low sat fat diet and check lab in 6 wk    Relevant Medications      simvastatin (ZOCOR) tablet   Other Relevant Orders      Lipid panel      ALT      AST

## 2014-03-09 ENCOUNTER — Other Ambulatory Visit: Payer: Self-pay | Admitting: Family Medicine

## 2014-04-16 ENCOUNTER — Other Ambulatory Visit: Payer: Medicare PPO

## 2014-04-19 ENCOUNTER — Encounter: Payer: Self-pay | Admitting: Gastroenterology

## 2014-08-24 ENCOUNTER — Telehealth: Payer: Self-pay | Admitting: Family Medicine

## 2014-08-24 ENCOUNTER — Telehealth: Payer: Self-pay

## 2014-08-24 NOTE — Telephone Encounter (Signed)
I will see her then  

## 2014-08-24 NOTE — Telephone Encounter (Signed)
Status: Signed       Expand All Collapse All   [facility] Patient Name: ZENYA HICKAM DOB: [birth year] Initial Comment Caller states she has swollen spider bite on finger from last Tues Nurse Assessment Nurse: Markus Daft, RN, [place] Date/Time (Eastern Time): 08/24/2014 3:02:38 PM Confirm and document reason for call. If symptomatic, describe symptoms. ---Caller states that she has a swollen finger and suspects either a boil or spider/insect bite. Has a little white head to it. She got rid of the pus, then flare up the next day and the whole finger is swollen now. She drained it, and cleaned it well with H2O2 and alcohol, but redness persist around the area, and swelling in finger has decreased. - Afebrile. Has the patient traveled out of the country within the last 30 days? ---Not Applicable Does the patient require triage? ---Yes Related visit to physician within the last 2 weeks? ---No Does the PT have any chronic conditions? (i.e. diabetes, asthma, etc.) ---No Guidelines Guideline Title Affirmed Question Affirmed Notes Boil (Skin Abscess) Boil > 1/2 inch across (> 12 mm; larger than a marble) Final Disposition User See PCP When Office is Open (within 3 days) Markus Daft, Therapist, sports, [place] Comments Appt made for 08/25/14 with Dr. Glori Bickers for 10:15 am.             Encounter MyChart Messages     No messages in this encounter     Routing History

## 2014-08-24 NOTE — Telephone Encounter (Signed)
[facility] Patient Name: Erica Carlson DOB: 07-25-1945 Initial Comment Caller states she has swollen spider bite on finger from last Tues Nurse Assessment Nurse: Markus Daft, RN, [place] Date/Time (Eastern Time): 08/24/2014 3:02:38 PM Confirm and document reason for call. If symptomatic, describe symptoms. ---Caller states that she has a swollen finger and suspects either a boil or spider/insect bite. Has a little white head to it. She got rid of the pus, then flare up the next day and the whole finger is swollen now. She drained it, and cleaned it well with H2O2 and alcohol, but redness persist around the area, and swelling in finger has decreased. - Afebrile. Has the patient traveled out of the country within the last 30 days? ---Not Applicable Does the patient require triage? ---Yes Related visit to physician within the last 2 weeks? ---No Does the PT have any chronic conditions? (i.e. diabetes, asthma, etc.) ---No Guidelines Guideline Title Affirmed Question Affirmed Notes Boil (Skin Abscess) Boil > 1/2 inch across (> 12 mm; larger than a marble) Final Disposition User See PCP When Office is Open (within 3 days) Markus Daft, Therapist, sports, [place] Comments Appt made for 08/25/14 with Dr. Glori Bickers for 10:15 am.

## 2014-08-25 ENCOUNTER — Encounter: Payer: Self-pay | Admitting: Family Medicine

## 2014-08-25 ENCOUNTER — Ambulatory Visit: Payer: Self-pay | Admitting: Family Medicine

## 2014-08-25 ENCOUNTER — Ambulatory Visit (INDEPENDENT_AMBULATORY_CARE_PROVIDER_SITE_OTHER): Payer: Medicare PPO | Admitting: Family Medicine

## 2014-08-25 VITALS — BP 158/84 | HR 74 | Temp 97.5°F | Ht 62.25 in | Wt 192.1 lb

## 2014-08-25 DIAGNOSIS — Z23 Encounter for immunization: Secondary | ICD-10-CM

## 2014-08-25 DIAGNOSIS — L089 Local infection of the skin and subcutaneous tissue, unspecified: Secondary | ICD-10-CM

## 2014-08-25 DIAGNOSIS — I10 Essential (primary) hypertension: Secondary | ICD-10-CM

## 2014-08-25 MED ORDER — DOXYCYCLINE HYCLATE 100 MG PO TABS: 100.0000 mg | ORAL_TABLET | Freq: Two times a day (BID) | ORAL | Status: DC

## 2014-08-25 MED ORDER — AMLODIPINE BESYLATE 10 MG PO TABS: 10.0000 mg | ORAL_TABLET | Freq: Every day | ORAL | Status: DC

## 2014-08-25 NOTE — Assessment & Plan Note (Signed)
Higher here than at home Pt will work on lifestyle change (DASH diet) and f/u 4-6 wk with her cuff to test it  May have to adj med or add  Refilled med

## 2014-08-25 NOTE — Patient Instructions (Signed)
Keep the finger clean and continue the antibiotic cream  Take doxycycline as directed for infection  If worse or no further improvement , especially if more redness or streaking   Follow up here in about 6-8 weeks for blood pressure  Keep working on weight loss  Bring your cuff with you to that visit    [place] stands for "Dietary Approaches to Stop Hypertension." The DASH eating plan is a healthy eating plan that has been shown to reduce high blood pressure (hypertension). Additional health benefits may include reducing the risk of type 2 diabetes mellitus, heart disease, and stroke. The DASH eating plan may also help with weight loss. WHAT DO I NEED TO KNOW ABOUT THE DASH EATING PLAN? For the DASH eating plan, you will follow these general guidelines:  Choose foods with a percent daily value for sodium of less than 5% (as listed on the food label).  Use salt-free seasonings or herbs instead of table salt or sea salt.  Check with your health care provider or pharmacist before using salt substitutes.  Eat lower-sodium products, often labeled as "lower sodium" or "no salt added."  Eat fresh foods.  Eat more vegetables, fruits, and low-fat dairy products.  Choose whole grains. Look for the word "whole" as the first word in the ingredient list.  Choose fish and skinless chicken or [place] more often than red meat. Limit fish, poultry, and meat to 6 oz (170 g) each day.  Limit sweets, desserts, sugars, and sugary drinks.  Choose heart-healthy fats.  Limit cheese to 1 oz (28 g) per day.  Eat more home-cooked food and less restaurant, buffet, and fast food.  Limit fried foods.  Cook foods using methods other than frying.  Limit canned vegetables. If you do use them, rinse them well to decrease the sodium.  When eating at a restaurant, ask that your food be prepared with less salt, or no salt if possible. WHAT FOODS CAN I EAT? Seek help from a dietitian for  individual calorie needs. Grains Whole grain or whole wheat bread. Brown rice. Whole grain or whole wheat pasta. Quinoa, bulgur, and whole grain cereals. Low-sodium cereals. Corn or whole wheat flour tortillas. Whole grain cornbread. Whole grain crackers. Low-sodium crackers. Vegetables Fresh or frozen vegetables (raw, steamed, roasted, or grilled). Low-sodium or reduced-sodium tomato and vegetable juices. Low-sodium or reduced-sodium tomato sauce and paste. Low-sodium or reduced-sodium canned vegetables.  Fruits All fresh, canned (in natural juice), or frozen fruits. Meat and Other Protein Products Ground beef (85% or leaner), grass-fed beef, or beef trimmed of fat. Skinless chicken or [place]. Ground chicken or [place]. Pork trimmed of fat. All fish and seafood. Eggs. Dried beans, peas, or lentils. Unsalted nuts and seeds. Unsalted canned beans. Dairy Low-fat dairy products, such as skim or 1% milk, 2% or reduced-fat cheeses, low-fat ricotta or cottage cheese, or plain low-fat yogurt. Low-sodium or reduced-sodium cheeses. Fats and Oils Tub margarines without trans fats. Light or reduced-fat mayonnaise and salad dressings (reduced sodium). Avocado. Safflower, olive, or canola oils. Natural peanut or almond butter. Other Unsalted popcorn and pretzels. The items listed above may not be a complete list of recommended foods or beverages. Contact your dietitian for more options. WHAT FOODS ARE NOT RECOMMENDED? Grains White bread. White pasta. White rice. Refined cornbread. Bagels and croissants. Crackers that contain trans fat. Vegetables Creamed or fried vegetables. Vegetables in a cheese sauce. Regular canned vegetables. Regular canned tomato sauce and paste. Regular tomato and vegetable juices.  Fruits Dried fruits. Canned fruit in light or heavy syrup. Fruit juice. Meat and Other Protein Products Fatty cuts of meat. Ribs, chicken wings, bacon, sausage, bologna, salami, chitterlings, fatback, hot  dogs, bratwurst, and packaged luncheon meats. Salted nuts and seeds. Canned beans with salt. Dairy Whole or 2% milk, cream, half-and-half, and cream cheese. Whole-fat or sweetened yogurt. Full-fat cheeses or blue cheese. Nondairy creamers and whipped toppings. Processed cheese, cheese spreads, or cheese curds. Condiments Onion and garlic salt, seasoned salt, table salt, and sea salt. Canned and packaged gravies. Worcestershire sauce. Tartar sauce. Barbecue sauce. Teriyaki sauce. Soy sauce, including reduced sodium. Steak sauce. Fish sauce. Oyster sauce. Cocktail sauce. Horseradish. Ketchup and mustard. Meat flavorings and tenderizers. Bouillon cubes. Hot sauce. Tabasco sauce. Marinades. Taco seasonings. Relishes. Fats and Oils Butter, stick margarine, lard, shortening, ghee, and bacon fat. Coconut, palm kernel, or palm oils. Regular salad dressings. Other Pickles and olives. Salted popcorn and pretzels. The items listed above may not be a complete list of foods and beverages to avoid. Contact your dietitian for more information. WHERE CAN I FIND MORE INFORMATION? National Heart, Lung, and Blood Institute: travelstabloid.com Document Released: 06/14/2011 Document Revised: 11/09/2013 Document Reviewed: 04/29/2013 [facility] Patient Information 2015 [place], [place]. This information is not intended to replace advice given to you by your health care provider. Make sure you discuss any questions you have with your health care provider.

## 2014-08-25 NOTE — Assessment & Plan Note (Signed)
With prev pustule-now per pt improved (1.5 cm of induration and erythema) Cover with doxycycline  Continue current care and topical abx  Can try warm compresses  If no improvement or worse will update asap

## 2014-08-25 NOTE — Progress Notes (Signed)
Subjective:    Patient ID: Erica Carlson, female    DOB: [birth year], 69 y.o.   MRN: [id number]  HPI Here with a skin injury on L 5th finger  Thinks it was some sort of bite- but did not witness it  Started with a white head type of lesion- she expressed pus out of it and cleaned it (alcohol/ witch hazel) Used otc antibiotic cream -? The name of it  Got some worse and then better Improved today   She has been cleaning indoors/ moving furniture   (not outdoors)   No numbness   Of note -had contact with daughter who had a boil - ? If mrsa- does not think so   bp has been ok at home Elevated here BP Readings from Last 3 Encounters:  08/25/14 158/84  03/05/14 166/100  09/30/13 136/76     Patient Active Problem List   Diagnosis Date Noted  . Obesity (BMI 30-39.9) 10/01/2013  . Encounter for Medicare annual wellness exam 09/30/2013  . Estrogen deficiency 09/17/2012  . Special screening for malignant neoplasms, colon 12/15/2010  . Other screening mammogram 12/15/2010  . BACK PAIN WITH RADICULOPATHY 09/06/2010  . OSTEOARTHRITIS, KNEE, RIGHT 10/11/2008  . HEMORRHOIDS 09/02/2008  . Hypertension 04/28/2008  . PNEUMONIA, HX OF 03/03/2008  . HYPERLIPIDEMIA 02/26/2008   Past Medical History  Diagnosis Date  . Pneumonia   . Arthritis     osteo arthritis of right knee   Past Surgical History  Procedure Laterality Date  . Abdominal hysterectomy    . Breast surgery     History  Substance Use Topics  . Smoking status: Never Smoker   . Smokeless tobacco: Never Used  . Alcohol Use: No   Family History  Problem Relation Age of Onset  . Breast cancer Sister    Allergies  Allergen Reactions  . Atorvastatin Other (See Comments)    Ache in back and legs   Current Outpatient Prescriptions on File Prior to Visit  Medication Sig Dispense Refill  . amLODipine (NORVASC) 10 MG tablet Take 1 tablet (10 mg total) by mouth daily. 30 tablet 11  . diphenhydrAMINE (BENADRYL) 25 mg  capsule Take 25 mg by mouth as needed.    Marland Kitchen ibuprofen (ADVIL,MOTRIN) 200 MG tablet Take 200 mg by mouth every 6 (six) hours as needed.    . simvastatin (ZOCOR) 20 MG tablet Take 1 tablet (20 mg total) by mouth at bedtime. (Patient not taking: Reported on 08/25/2014) 30 tablet 3   No current facility-administered medications on file prior to visit.    Review of Systems     Objective:   Physical Exam  Constitutional: She appears well-developed and well-nourished. No distress.  obese and well appearing   Eyes: Conjunctivae and EOM are normal. Pupils are equal, round, and reactive to light.  Neck: Normal range of motion. Neck supple. Carotid bruit is not present.  Cardiovascular: Normal rate, regular rhythm and normal heart sounds.   Pulmonary/Chest: Effort normal and breath sounds normal.  Musculoskeletal: She exhibits tenderness. She exhibits no edema.  Lymphadenopathy:    She has no cervical adenopathy.  Neurological: She is alert.  Skin: Skin is warm and dry. No rash noted. There is erythema.  L proximal 5th finger- 1.5 cm area of erythema and induration without fluctuance or drainage  Scab in middle is healing Nl perf/sens in area and rom of finger  Psychiatric: She has a normal mood and affect.  Assessment & Plan:   Problem List Items Addressed This Visit      Cardiovascular and Mediastinum   Hypertension    Higher here than at home Pt will work on lifestyle change (DASH diet) and f/u 4-6 wk with her cuff to test it  May have to adj med or add  Refilled med        Relevant Medications   amLODIpine (NORVASC) tablet     Musculoskeletal and Integument   Infection of skin of finger - Primary    With prev pustule-now per pt improved (1.5 cm of induration and erythema) Cover with doxycycline  Continue current care and topical abx  Can try warm compresses  If no improvement or worse will update asap

## 2014-08-25 NOTE — Progress Notes (Signed)
Pre visit review using our clinic review tool, if applicable. No additional management support is needed unless otherwise documented below in the visit note. 

## 2014-09-24 ENCOUNTER — Ambulatory Visit: Payer: Medicare PPO | Admitting: Family Medicine

## 2014-10-18 ENCOUNTER — Telehealth: Payer: Self-pay | Admitting: Family Medicine

## 2014-10-18 NOTE — Telephone Encounter (Signed)
Patient Name: Erica Carlson  DOB: 1945/12/15    Initial Comment Caller states is having constipation and hemorrhoids and was bleeding a little bit It went away. But started this week. [name] was advised to speak to nurse before making a DR. appt.    Nurse Assessment  Nurse: Melina Modena, RN, Estill Bamberg Date/Time [name] Time): 10/18/2014 5:20:48 PM  Confirm and document reason for call. If symptomatic, describe symptoms. ---Caller states is having constipation and hemorrhoids and was bleeding a little bit It went away. But started this week. [name] was advised to speak to nurse before making a DR. appt. pt had same sx last year and it went away after awhile so she never saw PCP for it  Has the patient traveled out of the country within the last 30 days? ---Not Applicable  Does the patient require triage? ---Yes  Related visit to physician within the last 2 weeks? ---No  Does the PT have any chronic conditions? (i.e. diabetes, asthma, etc.) ---Unknown     Guidelines    Guideline Title Affirmed Question Affirmed Notes  Rectal Bleeding Rectal bleeding (Exceptions: blood just on toilet paper, few drops, streaks on surface of normal formed BM)    Final Disposition User   See Physician within 24 Hours [place], RN, Estill Bamberg    Comments  Appt scheduled for 10/19/14 at 12:30pm with Dr. Roque Lias

## 2014-10-19 ENCOUNTER — Telehealth: Payer: Self-pay | Admitting: Family Medicine

## 2014-10-19 ENCOUNTER — Encounter: Payer: Self-pay | Admitting: Family Medicine

## 2014-10-19 ENCOUNTER — Ambulatory Visit (INDEPENDENT_AMBULATORY_CARE_PROVIDER_SITE_OTHER): Payer: Medicare PPO | Admitting: Family Medicine

## 2014-10-19 VITALS — BP 152/92 | HR 81 | Temp 98.2°F | Ht 62.0 in | Wt 187.4 lb

## 2014-10-19 DIAGNOSIS — K648 Other hemorrhoids: Secondary | ICD-10-CM | POA: Diagnosis not present

## 2014-10-19 MED ORDER — HYDROCORTISONE ACETATE 25 MG RE SUPP: 25.0000 mg | Freq: Every day | RECTAL | Status: DC

## 2014-10-19 NOTE — Telephone Encounter (Signed)
Pt has appt 10/19/14 at 12:30 with Dr Glori Bickers.

## 2014-10-19 NOTE — Telephone Encounter (Signed)
I re sent it specifying hydrocortisone "generic please"  Let me know if any problems

## 2014-10-19 NOTE — Patient Instructions (Signed)
You have internal hemorrhoids that sometimes bleed  Avoid straining if you can-keep stools soft  Use the anusol hc suppository as directed  If worse or no improvement in 1 week please let me know   If bleeding worsens please update [place]

## 2014-10-19 NOTE — Progress Notes (Signed)
Subjective:    Patient ID: Erica Carlson, female    DOB: 1946-03-27, 69 y.o.   MRN: [id number]  HPI Here with what she thinks are bleeding hemorrhoids  Has had bleeding twice this year Nov and then yesterday am   Tends to be constipated  Took MOM but stopped it  Now taking benefiber-that keeps stools soft but still occ needs to strain  Also carries the baby around- ? If straining    Bright red blood- drops in the toilet -not a large volume No abdominal or rectal pain  No itching or burning of the skin  Has not used otc products and she has never had px med   Run in the family    Last colonoscopy 2009 and that was normal   Patient Active Problem List   Diagnosis Date Noted  . Infection of skin of finger 08/25/2014  . Obesity (BMI 30-39.9) 10/01/2013  . Encounter for Medicare annual wellness exam 09/30/2013  . Estrogen deficiency 09/17/2012  . Special screening for malignant neoplasms, colon 12/15/2010  . Other screening mammogram 12/15/2010  . BACK PAIN WITH RADICULOPATHY 09/06/2010  . OSTEOARTHRITIS, KNEE, RIGHT 10/11/2008  . HEMORRHOIDS 09/02/2008  . Hypertension 04/28/2008  . PNEUMONIA, HX OF 03/03/2008  . HYPERLIPIDEMIA 02/26/2008   Past Medical History  Diagnosis Date  . Pneumonia   . Arthritis     osteo arthritis of right knee   Past Surgical History  Procedure Laterality Date  . Abdominal hysterectomy    . Breast surgery     History  Substance Use Topics  . Smoking status: Never Smoker   . Smokeless tobacco: Never Used  . Alcohol Use: No   Family History  Problem Relation Age of Onset  . Breast cancer Sister    Allergies  Allergen Reactions  . Atorvastatin Other (See Comments)    Ache in back and legs   Current Outpatient Prescriptions on File Prior to Visit  Medication Sig Dispense Refill  . amLODipine (NORVASC) 10 MG tablet Take 1 tablet (10 mg total) by mouth daily. 30 tablet 11  . diphenhydrAMINE (BENADRYL) 25 mg capsule Take 25 mg by  mouth as needed.     No current facility-administered medications on file prior to visit.       Review of Systems Review of Systems  Constitutional: Negative for fever, appetite change, fatigue and unexpected weight change.  Eyes: Negative for pain and visual disturbance.  Respiratory: Negative for cough and shortness of breath.   Cardiovascular: Negative for cp or palpitations    Gastrointestinal: Negative for nausea, diarrhea and pos for constipation, neg for abd pain  Genitourinary: Negative for urgency and frequency.  Skin: Negative for pallor or rash   Neurological: Negative for weakness, light-headedness, numbness and headaches.  Hematological: Negative for adenopathy. Does not bruise/bleed easily.  Psychiatric/Behavioral: Negative for dysphoric mood. The patient is not nervous/anxious.         Objective:   Physical Exam  Constitutional: She appears well-developed and well-nourished. No distress.  obese and well appearing   HENT:  Head: Normocephalic and atraumatic.  Eyes: Conjunctivae and EOM are normal. Pupils are equal, round, and reactive to light. No scleral icterus.  Cardiovascular: Normal rate and regular rhythm.   Pulmonary/Chest: Effort normal and breath sounds normal.  Abdominal: Soft. Bowel sounds are normal. She exhibits no distension and no mass. There is no tenderness. There is no rebound and no guarding.  Genitourinary: Rectal exam shows internal hemorrhoid. Rectal exam shows  no external hemorrhoid, no fissure, no mass, no tenderness and anal tone normal. Guaiac negative stool.  Internal hemorrhoid at 7:00 not actively bleeding seen on anoscope  Neurological: She is alert.  Skin: Skin is warm and dry. No rash noted. No erythema.  Psychiatric: She has a normal mood and affect.          Assessment & Plan:   Problem List Items Addressed This Visit      Cardiovascular and Mediastinum   Internal hemorrhoids - Primary    Seen on anoscope at approx 7:00  -not actively bleeding today (heme neg stool)  nontender tx with anusol hc suppositories - 7-10 d at bedtime Disc keeping stools soft as well and avoid straining  Given handout  If not improved or worse bleeding -enc to update/ would consider early colonoscopy

## 2014-10-19 NOTE — Progress Notes (Signed)
Pre visit review using our clinic review tool, if applicable. No additional management support is needed unless otherwise documented below in the visit note. 

## 2014-10-19 NOTE — Telephone Encounter (Signed)
Patient left a voicemail stating that she went to the pharmacy to pick up the anusol prescription and it was too expensive since it was not a generic. Patient stated that the pharmacist suggested hydrocortisone. Pharmacy-[facility].

## 2014-10-19 NOTE — Telephone Encounter (Signed)
Will see her then 

## 2014-10-19 NOTE — Assessment & Plan Note (Signed)
Seen on anoscope at approx 7:00 -not actively bleeding today (heme neg stool)  nontender tx with anusol hc suppositories - 7-10 d at bedtime Disc keeping stools soft as well and avoid straining  Given handout  If not improved or worse bleeding -enc to update/ would consider early colonoscopy

## 2014-10-20 NOTE — Telephone Encounter (Signed)
Left message informing generic rx was sent to pharmacy.

## 2014-10-21 NOTE — Telephone Encounter (Signed)
Pt left v/m requesting status of hydrocortisone refill; spoke with Rob at [facility] and rx on hold; generic hydrocortisone supp is still expensive and request pt to call him about quantity and cost. Left v/m for pt to cb.

## 2014-10-22 NOTE — Telephone Encounter (Signed)
Pt called back and notified as instructed; pt will ck with [facility].

## 2014-10-25 ENCOUNTER — Ambulatory Visit (INDEPENDENT_AMBULATORY_CARE_PROVIDER_SITE_OTHER): Payer: Medicare PPO | Admitting: Family Medicine

## 2014-10-25 ENCOUNTER — Encounter: Payer: Self-pay | Admitting: Family Medicine

## 2014-10-25 VITALS — BP 144/78 | HR 74 | Temp 98.2°F | Ht 61.2 in | Wt 186.0 lb

## 2014-10-25 DIAGNOSIS — I10 Essential (primary) hypertension: Secondary | ICD-10-CM | POA: Diagnosis not present

## 2014-10-25 MED ORDER — HYDROCHLOROTHIAZIDE 25 MG PO TABS: 25.0000 mg | ORAL_TABLET | Freq: Every day | ORAL | Status: DC

## 2014-10-25 NOTE — Progress Notes (Signed)
Subjective:    Patient ID: Erica Carlson, female    DOB: 02/07/1946, 69 y.o.   MRN: [id number]  HPI Here for f/u of HTN  BP Readings from Last 3 Encounters:  10/25/14 144/78  10/19/14 152/92  08/25/14 158/84    bp is improved today  At home 140/80  Had a relaxing weekend - but staying about there at home  Wt is down 1 lb with bmi of 34  Drinking more water  Staying away from processed food overall   Exercise - more walking - enjoys that / back still bothers her  Plans to get back to water aerobics this summer   No ha or cp or edema  Patient Active Problem List   Diagnosis Date Noted  . Infection of skin of finger 08/25/2014  . Obesity (BMI 30-39.9) 10/01/2013  . Encounter for Medicare annual wellness exam 09/30/2013  . Estrogen deficiency 09/17/2012  . Special screening for malignant neoplasms, colon 12/15/2010  . Other screening mammogram 12/15/2010  . BACK PAIN WITH RADICULOPATHY 09/06/2010  . OSTEOARTHRITIS, KNEE, RIGHT 10/11/2008  . Internal hemorrhoids 09/02/2008  . Hypertension 04/28/2008  . PNEUMONIA, HX OF 03/03/2008  . HYPERLIPIDEMIA 02/26/2008   Past Medical History  Diagnosis Date  . Pneumonia   . Arthritis     osteo arthritis of right knee   Past Surgical History  Procedure Laterality Date  . Abdominal hysterectomy    . Breast surgery     History  Substance Use Topics  . Smoking status: Never Smoker   . Smokeless tobacco: Never Used  . Alcohol Use: No   Family History  Problem Relation Age of Onset  . Breast cancer Sister    Allergies  Allergen Reactions  . Atorvastatin Other (See Comments)    Ache in back and legs   Current Outpatient Prescriptions on File Prior to Visit  Medication Sig Dispense Refill  . amLODipine (NORVASC) 10 MG tablet Take 1 tablet (10 mg total) by mouth daily. 30 tablet 11  . diphenhydrAMINE (BENADRYL) 25 mg capsule Take 25 mg by mouth as needed.    . hydrocortisone (ANUSOL-HC) 25 MG suppository Place 1  suppository (25 mg total) rectally at bedtime. For 7-10 days for hemorrhoids, generic please (Patient not taking: Reported on 10/25/2014) 10 suppository 1   No current facility-administered medications on file prior to visit.      Review of Systems Review of Systems  Constitutional: Negative for fever, appetite change, fatigue and unexpected weight change.  Eyes: Negative for pain and visual disturbance.  Respiratory: Negative for cough and shortness of breath.   Cardiovascular: Negative for cp or palpitations    Gastrointestinal: Negative for nausea, diarrhea and constipation.  Genitourinary: Negative for urgency and frequency.  Skin: Negative for pallor or rash   Neurological: Negative for weakness, light-headedness, numbness and headaches.  Hematological: Negative for adenopathy. Does not bruise/bleed easily.  Psychiatric/Behavioral: Negative for dysphoric mood. The patient is not nervous/anxious.         Objective:   Physical Exam  Constitutional: She appears well-developed and well-nourished. No distress.  obese and well appearing   HENT:  Head: Normocephalic and atraumatic.  Mouth/Throat: Oropharynx is clear and moist.  Eyes: Conjunctivae and EOM are normal. Pupils are equal, round, and reactive to light.  Neck: Normal range of motion. Neck supple. No JVD present. Carotid bruit is not present. No thyromegaly present.  Cardiovascular: Normal rate, regular rhythm, normal heart sounds and intact distal pulses.  Exam reveals  no gallop.   Pulmonary/Chest: Effort normal and breath sounds normal. No respiratory distress. She has no wheezes. She has no rales.  No crackles  Abdominal: Soft. Bowel sounds are normal. She exhibits no distension, no abdominal bruit and no mass. There is no tenderness.  Musculoskeletal: She exhibits no edema.  Lymphadenopathy:    She has no cervical adenopathy.  Neurological: She is alert. She has normal reflexes.  Skin: Skin is warm and dry. No rash  noted.  Psychiatric: She has a normal mood and affect.          Assessment & Plan:   Problem List Items Addressed This Visit      Cardiovascular and Mediastinum   Hypertension - Primary    bp is still not quite at goal here or at home  Will add HCTZ 25 mg daily -disc poss side eff /will update  Commended on better eating and exercise Enc wt loss F/u 2-3 wk for visit- will do labs that day      Relevant Medications   hydrochlorothiazide (HYDRODIURIL) 25 MG tablet

## 2014-10-25 NOTE — Progress Notes (Signed)
Pre visit review using our clinic review tool, if applicable. No additional management support is needed unless otherwise documented below in the visit note. 

## 2014-10-25 NOTE — Patient Instructions (Signed)
Continue amlodipine and add hctz 25 mg with it in the am  It will increase urination some  If any side effects or problems please let me know  Follow up in 2-3 weeks   Keep up the good work with diet and exercise

## 2014-10-25 NOTE — Assessment & Plan Note (Signed)
bp is still not quite at goal here or at home  Will add HCTZ 25 mg daily -disc poss side eff /will update  Commended on better eating and exercise Enc wt loss F/u 2-3 wk for visit- will do labs that day

## 2014-10-27 ENCOUNTER — Telehealth: Payer: Self-pay | Admitting: Family Medicine

## 2014-10-27 NOTE — Telephone Encounter (Signed)
Patient Name: Erica Carlson  DOB: 06-14-1946    Initial Comment Caller states she started on medication Monday- Blood Pressure, Hydroclorozide. Lost of appetite, headache and some dizziness. Also causing constipation.    Nurse Assessment  Nurse: Mallie Mussel, RN, Alveta Heimlich Date/Time [name] Time): 10/27/2014 1:07:26 PM  Confirm and document reason for call. If symptomatic, describe symptoms. ---Caller states that she was started on HCTZ on Monday for her blood pressure. She has experienced loss of appetite, headaches, some dizziness and some constipation. Denies dizziness at present. Her last BM was this morning, but it wasn't the usual amount. Head is mildly achy. Current BP is 158/87.  Has the patient traveled out of the country within the last 30 days? ---No  Does the patient require triage? ---Yes  Related visit to physician within the last 2 weeks? ---Yes  Does the PT have any chronic conditions? (i.e. diabetes, asthma, etc.) ---Yes  List chronic conditions. ---HTN     Guidelines    Guideline Title Affirmed Question Affirmed Notes  High Blood Pressure [1] Taking BP medications AND [2] feels is having side effects (e.g., impotence, cough, dizzy upon standing)    Final Disposition User   See PCP When Office is Open (within 3 days) Mallie Mussel, RN, Alveta Heimlich    Comments  Appointment scheduled for Friday, 22nd with Dr. Glori Bickers at 12:30pm.  Caller wants to know should she stop taking this medication. I called the backline and spoke with Vaughan Basta who transferred me to [facility]. Information given. Rena requested that I made a note about this to add to the file so she can get one of the providers to discuss this with her.

## 2014-10-27 NOTE — Telephone Encounter (Signed)
Spoke with patient who continues to feel weak and dizzy. She's not measured her blood pressure today, but she did take one HCTZ this morning and experienced nausea and vomiting. I advised patient to stop this medication until further evaluation with Dr. Glori Bickers on 4/22. Also advised if her symptoms worsened after hours she may need to go to the emergency department. She verbalized understanding.

## 2014-10-27 NOTE — Telephone Encounter (Signed)
Wade nurse with [facility] transferred pt to me; pt was seen on 10/25/14 and started HCTZ 25 mg on 10/25/14; on 10/26/14 pt had some stomach cramping and nausea but no vomiting. Today pt had vomiting x 1 earlier this AM but no vomiting since. Pt continues with nausea and stomach cramping,no abd pain. pt does not feel like can eat or drink anything now, pt continues with nagging h/a and dizziness that comes and goes; h/a started this AM. Pt had normal BM this AM except was lesser amt than usual, no mucus or blood in stool.pt has no fever and is not in any distress. Pt wants to see Dr Glori Bickers on 10/29/14 but wants to know if should continue taking the HCTZ. Last dose taken last night. Pt request cb. [facility].

## 2014-10-29 ENCOUNTER — Ambulatory Visit: Payer: Self-pay | Admitting: Family Medicine

## 2014-10-29 ENCOUNTER — Telehealth: Payer: Self-pay | Admitting: Family Medicine

## 2014-10-29 DIAGNOSIS — Z0289 Encounter for other administrative examinations: Secondary | ICD-10-CM

## 2014-10-29 NOTE — Telephone Encounter (Signed)
Patient did not come in for their appointment today for HCTZ side effects.  Please let me know if patient needs to be contacted immediately for follow up or no follow up needed.

## 2014-10-31 NOTE — Telephone Encounter (Signed)
Is this the right chart? There is nothing in it except 3 phone notes.

## 2014-11-15 ENCOUNTER — Encounter: Payer: Self-pay | Admitting: Family Medicine

## 2014-11-15 ENCOUNTER — Ambulatory Visit (INDEPENDENT_AMBULATORY_CARE_PROVIDER_SITE_OTHER): Payer: Medicare PPO | Admitting: Family Medicine

## 2014-11-15 VITALS — BP 146/84 | HR 74 | Temp 97.7°F | Ht 62.0 in | Wt 187.5 lb

## 2014-11-15 DIAGNOSIS — I10 Essential (primary) hypertension: Secondary | ICD-10-CM

## 2014-11-15 MED ORDER — LISINOPRIL 10 MG PO TABS: 10.0000 mg | ORAL_TABLET | Freq: Every day | ORAL | Status: DC

## 2014-11-15 NOTE — Assessment & Plan Note (Signed)
bp not in control at this time  BP Readings from Last 1 Encounters:  11/15/14 146/84  intol of hctz (unusual rxn)  Will try ace - lisinopril 10 mg daily  Continue amlodipine Disc lifstyle change with low sodium diet and exercise  Enc to keep working on wt loss F/u 2-3 wk

## 2014-11-15 NOTE — Progress Notes (Signed)
Subjective:    Patient ID: Erica Carlson, female    DOB: [birth year], 69 y.o.   MRN: [id number]  HPI Here for f/u of HTN   bp is stable today on first check  Last visit added HCTZ 25 mg daily - and she had side effects (only took 2 pills) - had cramps in stomach and nausea / and then vomiting the next day as well as a headache (no diarrhea and no fever)  No cp or palpitations or headaches or edema  No side effects to medicines  BP Readings from Last 3 Encounters:  11/15/14 146/84  10/25/14 144/78  10/19/14 152/92     Unusual reaction   Was 138/80 at home   GI side effects are gone now   Some exercise -not a whole lot - low impact  Some walking  Diet is fair (but "went off" on the weekend for mother's day") Avoiding salty things and grease   Patient Active Problem List   Diagnosis Date Noted  . Infection of skin of finger 08/25/2014  . Obesity (BMI 30-39.9) 10/01/2013  . Encounter for Medicare annual wellness exam 09/30/2013  . Estrogen deficiency 09/17/2012  . Special screening for malignant neoplasms, colon 12/15/2010  . Other screening mammogram 12/15/2010  . BACK PAIN WITH RADICULOPATHY 09/06/2010  . OSTEOARTHRITIS, KNEE, RIGHT 10/11/2008  . Internal hemorrhoids 09/02/2008  . Hypertension 04/28/2008  . PNEUMONIA, HX OF 03/03/2008  . HYPERLIPIDEMIA 02/26/2008   Past Medical History  Diagnosis Date  . Pneumonia   . Arthritis     osteo arthritis of right knee   Past Surgical History  Procedure Laterality Date  . Abdominal hysterectomy    . Breast surgery     History  Substance Use Topics  . Smoking status: Never Smoker   . Smokeless tobacco: Never Used  . Alcohol Use: No   Family History  Problem Relation Age of Onset  . Breast cancer Sister    Allergies  Allergen Reactions  . Atorvastatin Other (See Comments)    Ache in back and legs   Current Outpatient Prescriptions on File Prior to Visit  Medication Sig Dispense Refill  . amLODipine  (NORVASC) 10 MG tablet Take 1 tablet (10 mg total) by mouth daily. 30 tablet 11  . diphenhydrAMINE (BENADRYL) 25 mg capsule Take 25 mg by mouth as needed.    . hydrochlorothiazide (HYDRODIURIL) 25 MG tablet Take 1 tablet (25 mg total) by mouth daily. 30 tablet 3   No current facility-administered medications on file prior to visit.     Review of Systems Review of Systems  Constitutional: Negative for fever, appetite change, fatigue and unexpected weight change.  Eyes: Negative for pain and visual disturbance.  Respiratory: Negative for cough and shortness of breath.   Cardiovascular: Negative for cp or palpitations    Gastrointestinal: Negative for nausea, diarrhea and constipation.  Genitourinary: Negative for urgency and frequency.  Skin: Negative for pallor or rash   Neurological: Negative for weakness, light-headedness, numbness and headaches.  Hematological: Negative for adenopathy. Does not bruise/bleed easily.  Psychiatric/Behavioral: Negative for dysphoric mood. The patient is not nervous/anxious.         Objective:   Physical Exam  Constitutional: She appears well-developed and well-nourished. No distress.  obese and well appearing   HENT:  Head: Normocephalic and atraumatic.  Mouth/Throat: Oropharynx is clear and moist.  Eyes: Conjunctivae and EOM are normal. Pupils are equal, round, and reactive to light.  Neck: Normal range of motion.  Neck supple. No JVD present. Carotid bruit is not present. No thyromegaly present.  Cardiovascular: Normal rate, regular rhythm, normal heart sounds and intact distal pulses.  Exam reveals no gallop.   Pulmonary/Chest: Effort normal and breath sounds normal. No respiratory distress. She has no wheezes. She has no rales.  No crackles  Abdominal: Soft. Bowel sounds are normal. She exhibits no distension, no abdominal bruit and no mass. There is no tenderness.  Musculoskeletal: She exhibits no edema.  Lymphadenopathy:    She has no cervical  adenopathy.  Neurological: She is alert. She has normal reflexes.  Skin: Skin is warm and dry. No rash noted.  Psychiatric: She has a normal mood and affect.          Assessment & Plan:   Problem List Items Addressed This Visit    Hypertension - Primary    bp not in control at this time  BP Readings from Last 1 Encounters:  11/15/14 146/84  intol of hctz (unusual rxn)  Will try ace - lisinopril 10 mg daily  Continue amlodipine Disc lifstyle change with low sodium diet and exercise  Enc to keep working on wt loss F/u 2-3 wk       Relevant Medications   lisinopril (PRINIVIL,ZESTRIL) 10 MG tablet

## 2014-11-15 NOTE — Patient Instructions (Signed)
Stay off the HCTZ since it made you sick  Start lisinopril 10 mg daily in am  Stay on the amlodipine  Work on healthy diet and exercise   Follow up with me in 2-3 weeks

## 2014-11-15 NOTE — Progress Notes (Signed)
Pre visit review using our clinic review tool, if applicable. No additional management support is needed unless otherwise documented below in the visit note. 

## 2014-12-07 ENCOUNTER — Encounter: Payer: Self-pay | Admitting: Family Medicine

## 2014-12-07 ENCOUNTER — Ambulatory Visit (INDEPENDENT_AMBULATORY_CARE_PROVIDER_SITE_OTHER): Payer: Medicare PPO | Admitting: Family Medicine

## 2014-12-07 VITALS — BP 142/84 | HR 73 | Temp 97.8°F | Ht 62.0 in | Wt 188.2 lb

## 2014-12-07 DIAGNOSIS — E669 Obesity, unspecified: Secondary | ICD-10-CM

## 2014-12-07 DIAGNOSIS — I10 Essential (primary) hypertension: Secondary | ICD-10-CM | POA: Diagnosis not present

## 2014-12-07 LAB — BASIC METABOLIC PANEL
BUN: 19 mg/dL (ref 6–23)
CHLORIDE: 106 meq/L (ref 96–112)
CO2: 28 meq/L (ref 19–32)
Calcium: 9.4 mg/dL (ref 8.4–10.5)
Creatinine, Ser: 0.93 mg/dL (ref 0.40–1.20)
GFR: 77.06 mL/min (ref >60.00)
GLUCOSE: 102 mg/dL — AB (ref 70–99)
POTASSIUM: 4 meq/L (ref 3.5–5.1)
SODIUM: 138 meq/L (ref 135–145)

## 2014-12-07 NOTE — Assessment & Plan Note (Signed)
Discussed how this problem influences overall health and the risks it imposes  Reviewed plan for weight loss with lower calorie diet (via better food choices and also portion control or program like weight watchers) and exercise building up to or more than 30 minutes 5 days per week including some aerobic activity   Commended on new plan with personal trainer and nutritionist - she is motivated and starting off well  F/u 1-2 mo

## 2014-12-07 NOTE — Patient Instructions (Signed)
Keep track of BP at home - if any readings under 90s/60s or if you feel dizzy frequently please update me  Follow up with me in 1-2 months with your cuff - and make sure you took your BP med that day  Keep up the good work with diet and exercise

## 2014-12-07 NOTE — Progress Notes (Signed)
Pre visit review using our clinic review tool, if applicable. No additional management support is needed unless otherwise documented below in the visit note. 

## 2014-12-07 NOTE — Assessment & Plan Note (Signed)
Per pt - BP is going down at home - unsure if too low at times  She did not take med today- stable reading from last time Commended on beginning lifestyle change as well  Lab today for lisinopril F/u 1-2 mo with her cuff - making sure she has taken her lisinopril that day

## 2014-12-07 NOTE — Progress Notes (Signed)
Subjective:    Patient ID: Erica Carlson, female    DOB: [birth year], 69 y.o.   MRN: [id number]  HPI Here for f/u of HTN  Last visit - tried hctz and had side eff Changed to lisinopril 10 mg daily  She did not take her bp med today  BP Readings from Last 3 Encounters:  12/07/14 142/84  11/15/14 146/84  10/25/14 144/78    Feels ok - had a few episodes of dizziness since she started  That is improved   Last night 104/ 79   Wt is up 1 lb with bmi of 66  Knows she needs to work on diet and exercise   Got a Physiological scientist and a nutritionist - started last week  Trying to gain muscle Doing well with that now   Tries to increase her water - working on it and doing better   Patient Active Problem List   Diagnosis Date Noted  . Infection of skin of finger 08/25/2014  . Obesity (BMI 30-39.9) 10/01/2013  . Encounter for Medicare annual wellness exam 09/30/2013  . Estrogen deficiency 09/17/2012  . Special screening for malignant neoplasms, colon 12/15/2010  . Other screening mammogram 12/15/2010  . BACK PAIN WITH RADICULOPATHY 09/06/2010  . OSTEOARTHRITIS, KNEE, RIGHT 10/11/2008  . Internal hemorrhoids 09/02/2008  . Hypertension 04/28/2008  . PNEUMONIA, HX OF 03/03/2008  . HYPERLIPIDEMIA 02/26/2008   Past Medical History  Diagnosis Date  . Pneumonia   . Arthritis     osteo arthritis of right knee   Past Surgical History  Procedure Laterality Date  . Abdominal hysterectomy    . Breast surgery     History  Substance Use Topics  . Smoking status: Never Smoker   . Smokeless tobacco: Never Used  . Alcohol Use: No   Family History  Problem Relation Age of Onset  . Breast cancer Sister    Allergies  Allergen Reactions  . Atorvastatin Other (See Comments)    Ache in back and legs  . Hctz [Hydrochlorothiazide] Nausea And Vomiting    Nausea and vomiting    Current Outpatient Prescriptions on File Prior to Visit  Medication Sig Dispense Refill  . amLODipine  (NORVASC) 10 MG tablet Take 1 tablet (10 mg total) by mouth daily. 30 tablet 11  . diphenhydrAMINE (BENADRYL) 25 mg capsule Take 25 mg by mouth as needed.    Marland Kitchen lisinopril (PRINIVIL,ZESTRIL) 10 MG tablet Take 1 tablet (10 mg total) by mouth daily. 30 tablet 11   No current facility-administered medications on file prior to visit.    Review of Systems Review of Systems  Constitutional: Negative for fever, appetite change, fatigue and unexpected weight change.  Eyes: Negative for pain and visual disturbance.  Respiratory: Negative for cough and shortness of breath.   Cardiovascular: Negative for cp or palpitations   pos for ankle edema in the summer Gastrointestinal: Negative for nausea, diarrhea and constipation.  Genitourinary: Negative for urgency and frequency.  Skin: Negative for pallor or rash   Neurological: Negative for weakness, , numbness and headaches. pos for occ light headedness after starting ACE  Hematological: Negative for adenopathy. Does not bruise/bleed easily.  Psychiatric/Behavioral: Negative for dysphoric mood. The patient is not nervous/anxious.         Objective:   Physical Exam  Constitutional: She appears well-developed and well-nourished. No distress.  obese and well appearing   HENT:  Head: Normocephalic and atraumatic.  Mouth/Throat: Oropharynx is clear and moist.  Eyes: Conjunctivae and  EOM are normal. Pupils are equal, round, and reactive to light.  Neck: Normal range of motion. Neck supple. No JVD present. Carotid bruit is not present. No thyromegaly present.  Cardiovascular: Normal rate, regular rhythm, normal heart sounds and intact distal pulses.  Exam reveals no gallop.   Pulmonary/Chest: Effort normal and breath sounds normal. No respiratory distress. She has no wheezes. She has no rales.  No crackles  Abdominal: Soft. Bowel sounds are normal. She exhibits no distension, no abdominal bruit and no mass. There is no tenderness.  Musculoskeletal: She  exhibits edema.  Trace pedal edema bilat No tenderness or palp cords  Lymphadenopathy:    She has no cervical adenopathy.  Neurological: She is alert. She has normal reflexes.  Skin: Skin is warm and dry. No rash noted.  Psychiatric: She has a normal mood and affect.          Assessment & Plan:   Problem List Items Addressed This Visit    Hypertension - Primary    Per pt - BP is going down at home - unsure if too low at times  She did not take med today- stable reading from last time Commended on beginning lifestyle change as well  Lab today for lisinopril F/u 1-2 mo with her cuff - making sure she has taken her lisinopril that day      Relevant Orders   Basic metabolic panel   Obesity (BMI 30-39.9)    Discussed how this problem influences overall health and the risks it imposes  Reviewed plan for weight loss with lower calorie diet (via better food choices and also portion control or program like weight watchers) and exercise building up to or more than 30 minutes 5 days per week including some aerobic activity   Commended on new plan with personal trainer and nutritionist - she is motivated and starting off well  F/u 1-2 mo

## 2014-12-08 ENCOUNTER — Encounter: Payer: Self-pay | Admitting: *Deleted

## 2015-02-07 ENCOUNTER — Encounter: Payer: Self-pay | Admitting: Family Medicine

## 2015-02-07 ENCOUNTER — Ambulatory Visit (INDEPENDENT_AMBULATORY_CARE_PROVIDER_SITE_OTHER): Payer: Medicare PPO | Admitting: Family Medicine

## 2015-02-07 VITALS — BP 140/80 | HR 67 | Temp 98.0°F | Ht 62.0 in | Wt 180.8 lb

## 2015-02-07 DIAGNOSIS — I10 Essential (primary) hypertension: Secondary | ICD-10-CM | POA: Diagnosis not present

## 2015-02-07 NOTE — Progress Notes (Signed)
Subjective:    Patient ID: Erica Carlson, female    DOB: 13-Jul-1945, 69 y.o.   MRN: [id number]  HPI Here for f/u of HTN   Last visit added lisinopril- she states this makes her dizzy  (not orthostatic or positional)  At one time room was spinning  She still tries to take it some days  At home bp is usually 120s/60s - not low   (today 130/68)  No readings under 90s/50s  BP Readings from Last 3 Encounters:  02/07/15 144/78  12/07/14 142/84  11/15/14 146/84   already on amlodipine -tol well   Wt is down 8 lb with bmi of 33 Still in obese range but improved She has been really working on it - goes to the gym 3 times per week and exercises 2 times per week at home Also drinks more water  Better food choices     Chemistry      Component Value Date/Time   NA 138 12/07/2014 0838   K 4.0 12/07/2014 0838   CL 106 12/07/2014 0838   CO2 28 12/07/2014 0838   BUN 19 12/07/2014 0838   CREATININE 0.93 12/07/2014 0838      Component Value Date/Time   CALCIUM 9.4 12/07/2014 0838   ALKPHOS 73 09/17/2013 0912   AST 12 02/23/2014 0854   ALT 17 02/23/2014 0854   BILITOT 0.2* 09/17/2013 0912      Patient Active Problem List   Diagnosis Date Noted  . Infection of skin of finger 08/25/2014  . Obesity (BMI 30-39.9) 10/01/2013  . Encounter for Medicare annual wellness exam 09/30/2013  . Estrogen deficiency 09/17/2012  . Special screening for malignant neoplasms, colon 12/15/2010  . Other screening mammogram 12/15/2010  . BACK PAIN WITH RADICULOPATHY 09/06/2010  . OSTEOARTHRITIS, KNEE, RIGHT 10/11/2008  . Internal hemorrhoids 09/02/2008  . Hypertension 04/28/2008  . PNEUMONIA, HX OF 03/03/2008  . HYPERLIPIDEMIA 02/26/2008   Past Medical History  Diagnosis Date  . Pneumonia   . Arthritis     osteo arthritis of right knee   Past Surgical History  Procedure Laterality Date  . Abdominal hysterectomy    . Breast surgery     History  Substance Use Topics  . Smoking status:  Never Smoker   . Smokeless tobacco: Never Used  . Alcohol Use: No   Family History  Problem Relation Age of Onset  . Breast cancer Sister    Allergies  Allergen Reactions  . Atorvastatin Other (See Comments)    Ache in back and legs  . Hctz [Hydrochlorothiazide] Nausea And Vomiting    Nausea and vomiting    Current Outpatient Prescriptions on File Prior to Visit  Medication Sig Dispense Refill  . amLODipine (NORVASC) 10 MG tablet Take 1 tablet (10 mg total) by mouth daily. 30 tablet 11  . diphenhydrAMINE (BENADRYL) 25 mg capsule Take 25 mg by mouth as needed.    Marland Kitchen lisinopril (PRINIVIL,ZESTRIL) 10 MG tablet Take 1 tablet (10 mg total) by mouth daily. (Patient not taking: Reported on 02/07/2015) 30 tablet 11   No current facility-administered medications on file prior to visit.    Review of Systems Review of Systems  Constitutional: Negative for fever, appetite change, fatigue and unexpected weight change.  Eyes: Negative for pain and visual disturbance.  Respiratory: Negative for cough and shortness of breath.   Cardiovascular: Negative for cp or palpitations    Gastrointestinal: Negative for nausea, diarrhea and constipation.  Genitourinary: Negative for urgency and frequency.  Skin: Negative for pallor or rash   Neurological: Negative for weakness, numbness and headaches. pos for occ dizziness  Hematological: Negative for adenopathy. Does not bruise/bleed easily.  Psychiatric/Behavioral: Negative for dysphoric mood. The patient is not nervous/anxious.         Objective:   Physical Exam  Constitutional: She appears well-developed and well-nourished. No distress.  obese and well appearing  Wt loss noted from last visit   HENT:  Head: Normocephalic and atraumatic.  Mouth/Throat: Oropharynx is clear and moist.  Eyes: Conjunctivae and EOM are normal. Pupils are equal, round, and reactive to light.  No nystagmus   Neck: Normal range of motion. Neck supple. No JVD present.  Carotid bruit is not present. No thyromegaly present.  Cardiovascular: Normal rate, regular rhythm, normal heart sounds and intact distal pulses.  Exam reveals no gallop.   Pulmonary/Chest: Effort normal and breath sounds normal. No respiratory distress. She has no wheezes. She has no rales.  No crackles  Abdominal: Soft. Bowel sounds are normal. She exhibits no distension, no abdominal bruit and no mass. There is no tenderness.  Musculoskeletal: She exhibits no edema.  Lymphadenopathy:    She has no cervical adenopathy.  Neurological: She is alert. She has normal reflexes. No cranial nerve deficit. She exhibits normal muscle tone. Coordination normal.  Skin: Skin is warm and dry. No rash noted.  Psychiatric: She has a normal mood and affect.          Assessment & Plan:   Problem List Items Addressed This Visit    Hypertension - Primary    Per pt bp is much improved at home with lisinopril 10 and wt loss - but she feels dizzy in the afternoons (not orthostatic and no rep low bp) bp is stable here inst to split the lisinopril and take 5 mg daily  F/u 1 mo with her cuff Will call in the interim if dizziness does not improve      Relevant Medications   lisinopril (PRINIVIL,ZESTRIL) 10 MG tablet

## 2015-02-07 NOTE — Assessment & Plan Note (Signed)
Per pt bp is much improved at home with lisinopril 10 and wt loss - but she feels dizzy in the afternoons (not orthostatic and no rep low bp) bp is stable here inst to split the lisinopril and take 5 mg daily  F/u 1 mo with her cuff Will call in the interim if dizziness does not improve

## 2015-02-07 NOTE — Progress Notes (Signed)
Pre visit review using our clinic review tool, if applicable. No additional management support is needed unless otherwise documented below in the visit note. 

## 2015-02-07 NOTE — Patient Instructions (Signed)
Keep up the good work with diet and exercise and weight loss  Cut your lisinopril in 1/2 and just take 1/2 pill daily instead of a whole pill  If you still feel dizzy-please let me know  Follow up with me in about a month and bring your BP cuff with you

## 2015-02-26 ENCOUNTER — Emergency Department (HOSPITAL_COMMUNITY): Payer: Medicare PPO

## 2015-02-26 ENCOUNTER — Other Ambulatory Visit: Payer: Self-pay | Admitting: Physician Assistant

## 2015-02-26 ENCOUNTER — Encounter (HOSPITAL_COMMUNITY): Payer: Self-pay | Admitting: Emergency Medicine

## 2015-02-26 ENCOUNTER — Ambulatory Visit (INDEPENDENT_AMBULATORY_CARE_PROVIDER_SITE_OTHER): Payer: Medicare PPO

## 2015-02-26 ENCOUNTER — Ambulatory Visit (INDEPENDENT_AMBULATORY_CARE_PROVIDER_SITE_OTHER): Payer: Medicare PPO | Admitting: Family Medicine

## 2015-02-26 ENCOUNTER — Ambulatory Visit: Payer: Medicare PPO

## 2015-02-26 ENCOUNTER — Emergency Department (HOSPITAL_COMMUNITY)
Admission: EM | Admit: 2015-02-26 | Discharge: 2015-02-26 | Disposition: A | Discharge status: Home / Self Care | Payer: Medicare PPO | Attending: Emergency Medicine | Admitting: Emergency Medicine

## 2015-02-26 VITALS — BP 162/100 | HR 60 | Temp 97.4°F | Resp 16 | Ht 61.25 in | Wt 179.2 lb

## 2015-02-26 DIAGNOSIS — S43005D Unspecified dislocation of left shoulder joint, subsequent encounter: Secondary | ICD-10-CM | POA: Diagnosis not present

## 2015-02-26 DIAGNOSIS — Y9289 Other specified places as the place of occurrence of the external cause: Secondary | ICD-10-CM | POA: Diagnosis not present

## 2015-02-26 DIAGNOSIS — S43005A Unspecified dislocation of left shoulder joint, initial encounter: Secondary | ICD-10-CM | POA: Diagnosis not present

## 2015-02-26 DIAGNOSIS — Y9301 Activity, walking, marching and hiking: Secondary | ICD-10-CM | POA: Diagnosis not present

## 2015-02-26 DIAGNOSIS — M1990 Unspecified osteoarthritis, unspecified site: Secondary | ICD-10-CM | POA: Insufficient documentation

## 2015-02-26 DIAGNOSIS — M25512 Pain in left shoulder: Secondary | ICD-10-CM

## 2015-02-26 DIAGNOSIS — Z79899 Other long term (current) drug therapy: Secondary | ICD-10-CM | POA: Insufficient documentation

## 2015-02-26 DIAGNOSIS — R52 Pain, unspecified: Secondary | ICD-10-CM

## 2015-02-26 DIAGNOSIS — S4992XA Unspecified injury of left shoulder and upper arm, initial encounter: Secondary | ICD-10-CM | POA: Diagnosis present

## 2015-02-26 DIAGNOSIS — Z8701 Personal history of pneumonia (recurrent): Secondary | ICD-10-CM | POA: Insufficient documentation

## 2015-02-26 DIAGNOSIS — W01198A Fall on same level from slipping, tripping and stumbling with subsequent striking against other object, initial encounter: Secondary | ICD-10-CM | POA: Diagnosis not present

## 2015-02-26 DIAGNOSIS — Y998 Other external cause status: Secondary | ICD-10-CM | POA: Insufficient documentation

## 2015-02-26 MED ORDER — PROPOFOL 10 MG/ML IV BOLUS
100.0000 mg | Freq: Once | INTRAVENOUS | Status: AC
  Filled 2015-02-26: qty 1

## 2015-02-26 MED ORDER — NAPROXEN 500 MG PO TABS: 500.0000 mg | ORAL_TABLET | Freq: Two times a day (BID) | ORAL | Status: DC

## 2015-02-26 MED ORDER — MIDAZOLAM HCL 2 MG/2ML IJ SOLN
INTRAMUSCULAR | Status: AC | PRN
  Administered 2015-02-26: 2 mg via INTRAVENOUS

## 2015-02-26 MED ORDER — KETOROLAC TROMETHAMINE 60 MG/2ML IM SOLN
60.0000 mg | Freq: Once | INTRAMUSCULAR | Status: AC
  Administered 2015-02-26: 60 mg via INTRAMUSCULAR

## 2015-02-26 MED ORDER — HYDROCODONE-ACETAMINOPHEN 5-325 MG PO TABS: 2.0000 | ORAL_TABLET | ORAL | Status: DC | PRN

## 2015-02-26 MED ORDER — PROPOFOL 10 MG/ML IV BOLUS
INTRAVENOUS | Status: AC | PRN
  Administered 2015-02-26: 30 mg via INTRAVENOUS

## 2015-02-26 MED ORDER — MIDAZOLAM HCL 2 MG/2ML IJ SOLN
2.0000 mg | Freq: Once | INTRAMUSCULAR | Status: AC
  Filled 2015-02-26: qty 2

## 2015-02-26 NOTE — Progress Notes (Signed)
Patient was seen and evaluated by me, agree with A/P after failed attempts at reduction

## 2015-02-26 NOTE — Progress Notes (Signed)
Subjective:   Patient ID: Erica Carlson, female     DOB: [birth year], 69 y.o.    MRN: [id number]  PCP: Loura Pardon, MD  Chief Complaint  Patient presents with  . Fall    at the gym  . Shoulder Pain    left shoulder--pain can be felt to her hand    HPI  Presents for evaluation of LEFT shoulder pain.  She is accompanied by her daughter. They were at the gym doing a shuffle, when she tripped over her own feet and fell. She thinks that she fell forward, catching herself initially with there outstretched hands, and then onto the LEFT shoulder. She immediately had severe pain, made worse with movement of the shoulder that radiates down the arm to the wrist. She has pain in the shoulder with movement of the forearm and elbow.  RIGHT hand dominant. 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  Prior to Admission medications   Medication Sig Start Date End Date Taking? Authorizing Provider  amLODipine (NORVASC) 10 MG tablet Take 1 tablet (10 mg total) by mouth daily. 08/25/14  Yes Abner Greenspan, MD  diphenhydrAMINE (BENADRYL) 25 mg capsule Take 25 mg by mouth as needed.   Yes Historical Provider, MD  lisinopril (PRINIVIL,ZESTRIL) 10 MG tablet Take 5 mg by mouth daily.   Yes Historical Provider, MD     Allergies  Allergen Reactions  . Atorvastatin Other (See Comments)    Ache in back and legs  . Hctz [Hydrochlorothiazide] Nausea And Vomiting    Nausea and vomiting      Patient Active Problem List   Diagnosis Date Noted  . Infection of skin of finger 08/25/2014  . Obesity (BMI 30-39.9) 10/01/2013  . Encounter for Medicare annual wellness exam 09/30/2013  . Estrogen deficiency 09/17/2012  . Special screening for malignant neoplasms, colon 12/15/2010  . Other screening mammogram 12/15/2010  . BACK PAIN WITH RADICULOPATHY 09/06/2010  . OSTEOARTHRITIS, KNEE, RIGHT 10/11/2008  . Internal hemorrhoids 09/02/2008  . Hypertension 04/28/2008  . PNEUMONIA, HX OF 03/03/2008  .  HYPERLIPIDEMIA 02/26/2008     Family History  Problem Relation Age of Onset  . Breast cancer Sister      Social History   Social History  . Marital Status: Widowed    Spouse Name: n/a  . Number of Children: 6  . Years of Education: 14   Occupational History  . 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 Smoking status: Never Smoker   . Smokeless tobacco: Never Used  . Alcohol Use: No  . Drug Use: No  . Sexual Activity: Not on file   Other Topics Concern  . Not on file   Social History Narrative   Lives alone.   Her adult children live nearby.        Review of Systems  Constitutional: Negative.   Eyes: Negative for visual disturbance.  Respiratory: Negative for cough, chest tightness and shortness of breath.   Cardiovascular: Negative for chest pain, palpitations and leg swelling.  Musculoskeletal: Positive for arthralgias. Negative for gait problem.  Skin: Positive for wound.  Neurological: Negative for dizziness and headaches.         Objective:  Physical Exam  Constitutional: She is oriented to person, place, and time. She appears well-developed and well-nourished. She is active and cooperative. No distress.  BP 162/100 mmHg  Pulse 60  Temp(Src) 97.4 F (36.3 C) (Oral)  Resp 16  Ht  5' 1.25" (1.556 m)  Wt 179 lb 3.2 oz (81.285 kg)  BMI 33.57 kg/m2  SpO2 98%   Eyes: Conjunctivae are normal.  Pulmonary/Chest: Effort normal.  Musculoskeletal:       Right shoulder: Normal.       Left shoulder: She exhibits decreased range of motion, tenderness, bony tenderness and pain. She exhibits no swelling, no laceration, normal pulse and normal strength.       Left elbow: Normal.       Left wrist: Normal.       Cervical back: Normal.       Back:       Left upper arm: She exhibits tenderness. She exhibits no bony tenderness, no swelling, no edema, no deformity and no laceration.       Left forearm: Normal.       Left hand:  Normal. Normal sensation noted. Normal strength noted.  Neurological: She is alert and oriented to person, place, and time.  Psychiatric: She has a normal mood and affect. Her speech is normal and behavior is normal.    LEFT Shoulder: [facility] reading (PRIMARY) by  Dr. Marin Comment.  Inferior anterior dislocation of the shoulder. No fractures noted. Mild DJD.  With permission, the shoulder was prepped in sterile fashion and injected with 20 cc 1% lidocaine plain. Then with the patient in a supine position, the arm was externally rotated with the elbow at 90 degrees of flexion. With audible click, the patient reported improvement.  Unfortunately post-reduction film revealed persistent dislocation.  Re-attempt at reduction using scapular method also unsuccessful.  Toradol 60 mg IM administered.  Re-attempt with the patient in prone position and additional attempt with external rotation unsuccessful.     Assessment & Plan:  1. Pain in joint, shoulder region, left 2. Shoulder dislocation, left, initial encounter Unsuccessful reduction of shoulder dislocation. [facility]oke with [facility], but that facility isn't able to provide more pain relief than we are. Proceed to the ED for additional treatment. Charge nurse, Museum/gallery conservator, made aware of patient's pending arrival. - PR LIDOCAINE INJECTION - DG Shoulder Left; Future - DG Shoulder Left; Future - ketorolac (TORADOL) injection 60 mg; Inject 2 mLs (60 mg total) into the muscle once.    Fara Chute, PA-C Physician Assistant-Certified [facility]

## 2015-02-26 NOTE — Discharge Instructions (Signed)

## 2015-02-26 NOTE — ED Notes (Signed)
Patient here with complaints of left shoulder injury, deformity noted. Pain 10/10. AAO x4.

## 2015-02-26 NOTE — Patient Instructions (Addendum)
Go the the emergency department. You need more pain medication than we have available here in order to relax enough to relocate the shoulder.  Turn LEFT out of our parking lot onto [address] Maryan Puls RIGHT onto [address] [place]. Turn LEFT onto [address]. Straight at the light at [facility]. Follow signs to the Emergency Department.

## 2015-02-26 NOTE — ED Notes (Signed)
Bed: XI33 Expected date: 02/26/15 Expected time: 1:35 PM Means of arrival:  Comments: Dislocated shoulder pain

## 2015-02-26 NOTE — ED Provider Notes (Signed)
CSN: [id number]     Arrival date & time 02/26/15  1351 History   First MD Initiated Contact with Patient 02/26/15 1355     Chief Complaint  Patient presents with  . Shoulder Injury     (Consider location/radiation/quality/duration/timing/severity/associated sxs/prior Treatment) HPI Comments: The patient is a 69 year old female who was at the gym this morning, she was walking when she lost her balance, fell onto her left shoulder, suffered severe pain with deformity, inability to move her arm because of pain but no numbness or weakness. She was seen at an urgent care, x-rays were obtained, she describes having a dislocated shoulder with an inability to relocate the shoulder at the urgent care with Toradol. She denies any prior shoulder injuries, there has been no head injuries, she is able to ambulate without difficulty. She denies fevers chills nausea vomiting shortness of breath or chest pain and has no cardiac history.  The history is provided by the patient.    Past Medical History  Diagnosis Date  . Pneumonia   . Arthritis     osteo arthritis of right knee   Past Surgical History  Procedure Laterality Date  . Abdominal hysterectomy    . Breast surgery     Family History  Problem Relation Age of Onset  . Breast cancer Sister    Social History  Substance Use Topics  . Smoking status: Never Smoker   . Smokeless tobacco: Never Used  . Alcohol Use: No   OB History    No data available     Review of Systems  All other systems reviewed and are negative.     Allergies  Atorvastatin and Hctz  Home Medications   Prior to Admission medications   Medication Sig Start Date End Date Taking? Authorizing Provider  amLODipine (NORVASC) 10 MG tablet Take 1 tablet (10 mg total) by mouth daily. 08/25/14  Yes Abner Greenspan, MD  diphenhydrAMINE (BENADRYL) 25 mg capsule Take 25 mg by mouth as needed for itching.    Yes Historical Provider, MD  lisinopril (PRINIVIL,ZESTRIL) 10 MG  tablet Take 5 mg by mouth daily.   Yes Historical Provider, MD  HYDROcodone-acetaminophen (NORCO/VICODIN) 5-325 MG per tablet Take 2 tablets by mouth every 4 (four) hours as needed. 02/26/15   Noemi Chapel, MD  naproxen (NAPROSYN) 500 MG tablet Take 1 tablet (500 mg total) by mouth 2 (two) times daily with a meal. 02/26/15   Noemi Chapel, MD   BP 173/80 mmHg  Pulse 64  Temp(Src) 98.1 F (36.7 C) (Oral)  Resp 18  SpO2 100% Physical Exam  Constitutional: She appears well-developed and well-nourished. No distress.  HENT:  Head: Normocephalic and atraumatic.  Mouth/Throat: Oropharynx is clear and moist. No oropharyngeal exudate.  Eyes: Conjunctivae and EOM are normal. Pupils are equal, round, and reactive to light. Right eye exhibits no discharge. Left eye exhibits no discharge. No scleral icterus.  Neck: Normal range of motion. Neck supple. No JVD present. No thyromegaly present.  Cardiovascular: Normal rate, regular rhythm, normal heart sounds and intact distal pulses.  Exam reveals no gallop and no friction rub.   No murmur heard. Normal pulses at the left hand  Pulmonary/Chest: Effort normal and breath sounds normal. No respiratory distress. She has no wheezes. She has no rales.  Abdominal: Soft. Bowel sounds are normal. She exhibits no distension and no mass. There is no tenderness.  Musculoskeletal: She exhibits tenderness. She exhibits no edema.  Decreased range of motion of the left shoulder  secondary to pain. Normal range of motion of the elbow and wrist on the left  Lymphadenopathy:    She has no cervical adenopathy.  Neurological: She is alert. Coordination normal.  Normal sensation of the left upper extremity  Skin: Skin is warm and dry. No rash noted. No erythema.  Psychiatric: She has a normal mood and affect. Her behavior is normal.  Nursing note and vitals reviewed.  ED Course  Procedures (including critical care time) Labs Review Labs Reviewed - No data to  display  Imaging Review Dg Shoulder Left  02/26/2015   CLINICAL DATA:  Status post reduction of left shoulder  EXAM: LEFT SHOULDER - 2+ VIEW  COMPARISON:  February 26, 2015  FINDINGS: The previously noted dislocation has been reduced. No fracture is identified.  IMPRESSION: The previously noted dislocation has been reduced. No fracture is identified.   Electronically Signed   By: Abelardo Diesel M.D.   On: 02/26/2015 15:33   Dg Shoulder Left  02/26/2015   CLINICAL DATA:  Shoulder dislocation.  Status postreduction.  EXAM: LEFT SHOULDER - 2+ VIEW  COMPARISON:  Prior today  FINDINGS: Persistent anterior inferior dislocation of the humeral head is seen. No acute fracture identified.  IMPRESSION: Persistent anterior -inferior dislocation of the humeral head.   Electronically Signed   By: Earle Gell M.D.   On: 02/26/2015 14:16   Dg Shoulder Left  02/26/2015   CLINICAL DATA:  Fall. Left shoulder injury and pain. Initial encounter.  EXAM: LEFT SHOULDER - 2+ VIEW  COMPARISON:  08/14/2013  FINDINGS: Anterior inferior dislocation of the humeral head is seen. No evidence of acute fracture. Mild degenerative spurring of the humeral head noted.  IMPRESSION: Anterior inferior shoulder dislocation. No acute fracture identified.   Electronically Signed   By: Earle Gell M.D.   On: 02/26/2015 14:15   I have personally reviewed and evaluated these images and lab results as part of my medical decision-making.  MDM   Final diagnoses:  Shoulder dislocation, left, initial encounter   Shoulder will need to be relocated. The x-ray shows an anterior inferior shoulder dislocation.  Reduction of dislocation Date/Time: 3:45 PM Performed by: Johnna Acosta Authorized by: Johnna Acosta Consent: Verbal consent obtained. Risks and benefits: risks, benefits and alternatives were discussed Consent given by: patient Required items: required blood products, implants, devices, and special equipment available Time out:  Immediately prior to procedure a "time out" was called to verify the correct patient, procedure, equipment, support staff and site/side marked as required.  Patient sedated: Yes - Propofol and Versed  Vitals: Vital signs were monitored during sedation. Patient tolerance: Patient tolerated the procedure well with no immediate complications. Joint: R shoulder Reduction technique: distraction - traction / countertraction 1 attempt - succesful Sling placed - by myself assisted by ortho tech NV status rechecked after sling placed - in tact.  Procedural sedation Performed by: Johnna Acosta Consent: Verbal consent obtained. Risks and benefits: risks, benefits and alternatives were discussed Required items: required blood products, implants, devices, and special equipment available Patient identity confirmed: arm band and provided demographic data Time out: Immediately prior to procedure a "time out" was called to verify the correct patient, procedure, equipment, support staff and site/side marked as required.  Sedation type: moderate (conscious) sedation NPO time confirmed and considedered  Sedatives: PROPOFOL (30mg ), Versed (2mg )  Physician Time at Bedside: 10 minutes  Vitals: Vital signs were monitored during sedation. Cardiac Monitor, pulse oximeter Patient tolerance: Patient tolerated the procedure well with  no immediate complications. Comments: Pt with uneventful recovered. Returned to pre-procedural sedation baseline  F/u with Ortho - pt and family in agreement.  Meds given in ED:  Medications  propofol (DIPRIVAN) 10 mg/mL bolus/IV push 100 mg (0 mg Intravenous Stopped 02/26/15 1503)  midazolam (VERSED) injection 2 mg (0 mg Intravenous Duplicate 5/63/89 3734)  midazolam (VERSED) injection (2 mg Intravenous Given 02/26/15 1454)  propofol (DIPRIVAN) 10 mg/mL bolus/IV push ( Intravenous Stopped 02/26/15 1503)    New Prescriptions   HYDROCODONE-ACETAMINOPHEN (NORCO/VICODIN) 5-325  MG PER TABLET    Take 2 tablets by mouth every 4 (four) hours as needed.   NAPROXEN (NAPROSYN) 500 MG TABLET    Take 1 tablet (500 mg total) by mouth 2 (two) times daily with a meal.         Noemi Chapel, MD 02/26/15 [phone]

## 2015-02-28 ENCOUNTER — Telehealth: Payer: Self-pay

## 2015-02-28 NOTE — Telephone Encounter (Signed)
PLEASE NOTE: All timestamps contained within this report are represented as [place] Standard Time. CONFIDENTIALTY NOTICE: This fax transmission is intended only for the addressee. It contains information that is legally privileged, confidential or otherwise protected from use or disclosure. If you are not the intended recipient, you are strictly prohibited from reviewing, disclosing, copying using or disseminating any of this information or taking any action in reliance on or regarding this information. If you have received this fax in error, please notify us immediately by telephone so that we can arrange for its return to [place]. Phone: [phone], Toll-Free: [phone], Fax: [phone] Page: 1 of 2 Call Id: [id number] [place] Patient Name: Erica Carlson Gender: Female DOB: January 16, 1946 Age: 69 Y 60 M 29 D Return Phone Number: [phone] (Primary) Address: City/State/Zip: [place] Client [place] Night - Client Client Site [facility] [place] Contact Type Call Call Type Triage / Clinical Caller Name Solange Relationship To Patient Daughter Return Phone Number [phone] (Primary) Chief Complaint Shoulder Injury Initial Comment Caller states mom fell on her left side shoulder injury PreDisposition Go to ED Nurse Assessment Nurse: Malva Cogan, RN, Juliann Pulse Date/Time [name] Time): 02/26/2015 11:19:16 AM Confirm and document reason for call. If symptomatic, describe symptoms. ---Caller states that her mother fell at the gym approx 15-20" ago & injured her (L) shoulder, no break in skin integrity. Has the patient traveled out of the country within the last 30 days? ---No Does the patient require triage? ---Yes Related visit to physician within the last 2 weeks? ---No Does the PT have any chronic conditions? (i.e.  diabetes, asthma, etc.) ---Yes List chronic conditions. ---HTN Guidelines Guideline Title Affirmed Question Affirmed Notes Nurse Date/Time [name] Time) Shoulder Injury Looks like a dislocated joint Malva Cogan, RN, Juliann Pulse 02/26/2015 11:20:16 AM Disp. Time [name] Time) Disposition Final User 02/26/2015 11:01:17 AM Attempt made - message left Carola Rhine 02/26/2015 11:21:48 AM Go to ED Now Yes Malva Cogan, RN, Gara Kroner Understands: Yes Disagree/Comply: Comply Care Advice Given Per Guideline PLEASE NOTE: All timestamps contained within this report are represented as [place] Standard Time. CONFIDENTIALTY NOTICE: This fax transmission is intended only for the addressee. It contains information that is legally privileged, confidential or otherwise protected from use or disclosure. If you are not the intended recipient, you are strictly prohibited from reviewing, disclosing, copying using or disseminating any of this information or taking any action in reliance on or regarding this information. If you have received this fax in error, please notify us immediately by telephone so that we can arrange for its return to [place]. Phone: [phone], Toll-Free: [phone], Fax: [phone] Page: 2 of 2 Call Id: [id number] Care Advice Given Per Guideline GO TO ED NOW: You need to be seen in the Emergency Department. Go to the ER at ___________ [place] now. Drive carefully. FIRST AID ADVICE FOR SUSPECTED FRACTURE OR DISLOCATION OF THE SHOULDER: * Use a sling to support the arm. Make the sling with a triangular piece of cloth. * Or, at the very least. the patient can support the injured arm with the other hand or a pillow. DRIVING: Another adult should drive. NOTHING BY MOUTH: Do not eat or drink anything for now. (Reason: condition may need surgery and general anesthesia) CARE ADVICE given per Shoulder Injury (Adult) guideline. After Care Instructions Given Call Event Type User Date / Time  Description Referrals [facility] -  ED

## 2015-02-28 NOTE — Telephone Encounter (Signed)
Pt seen [facility] ED on 02/26/15.

## 2015-03-01 ENCOUNTER — Encounter: Payer: Self-pay | Admitting: Family Medicine

## 2015-03-01 ENCOUNTER — Ambulatory Visit (INDEPENDENT_AMBULATORY_CARE_PROVIDER_SITE_OTHER): Payer: Medicare PPO | Admitting: Family Medicine

## 2015-03-01 VITALS — BP 140/80 | HR 77 | Temp 98.4°F | Wt 181.0 lb

## 2015-03-01 DIAGNOSIS — S43005D Unspecified dislocation of left shoulder joint, subsequent encounter: Secondary | ICD-10-CM

## 2015-03-01 DIAGNOSIS — I10 Essential (primary) hypertension: Secondary | ICD-10-CM

## 2015-03-01 DIAGNOSIS — S43005S Unspecified dislocation of left shoulder joint, sequela: Secondary | ICD-10-CM

## 2015-03-01 DIAGNOSIS — S43005A Unspecified dislocation of left shoulder joint, initial encounter: Secondary | ICD-10-CM | POA: Insufficient documentation

## 2015-03-01 NOTE — Patient Instructions (Signed)
Continue your naproxen  Use ice on shoulder at least several times daily for 10 minutes  Try getting out of your sling for an hour at a time to see how you do  Follow up with Dr Lorelei Pont ([facility] here)- in the next 1-2 weeks   Keep watching blood pressure

## 2015-03-01 NOTE — Assessment & Plan Note (Signed)
bp is improving at home per pt - until she dislocated shoulder and started taking naproxen  BP: 140/80 mmHg  Here on 2nd check  Feels better on lower dose of ace  Will f/u late Sept with her cuff for a re check

## 2015-03-01 NOTE — Assessment & Plan Note (Signed)
Pt seen on 8/20 in ED and it was reduced with anesthesia  Rev ED notes and xray  She is doing much better -in a sling and eager to get out of it  ROM is fair and pt is not having much pain  Will continue naproxen (watching bp) Enc use of cold compress when able for 10 minutes  Can take sling off for short periods for some passive range of motion  F/u appt made with Dr Lorelei Pont for this for a re check - may need PT in the future

## 2015-03-01 NOTE — Progress Notes (Signed)
Subjective:    Patient ID: Erica Carlson, female    DOB: 25-Jun-1946, 69 y.o.   MRN: [id number]  HPI Here for f/u of a fall and shoulder dislocation on 8/20   She was doing a shuffle move at the gym - tripped on carpet - she fell directly on her L shoulder  Went to [facility] - they could not reduce it  ED - had to be medicated to have it reduced  Pt in a sling  Has not taken it out of sling very much yet   A little ache -not too bad  Given vicodin-she chose not to take it  She takes naproxen   Has not used any ice   F/u xray was ok  Xray reports:  Dg Shoulder Left  02/26/2015   CLINICAL DATA:  Status post reduction of left shoulder  EXAM: LEFT SHOULDER - 2+ VIEW  COMPARISON:  February 26, 2015  FINDINGS: The previously noted dislocation has been reduced. No fracture is identified.  IMPRESSION: The previously noted dislocation has been reduced. No fracture is identified.   Electronically Signed   By: Abelardo Diesel M.D.   On: 02/26/2015 15:33   Dg Shoulder Left  02/26/2015   CLINICAL DATA:  Shoulder dislocation.  Status postreduction.  EXAM: LEFT SHOULDER - 2+ VIEW  COMPARISON:  Prior today  FINDINGS: Persistent anterior inferior dislocation of the humeral head is seen. No acute fracture identified.  IMPRESSION: Persistent anterior -inferior dislocation of the humeral head.   Electronically Signed   By: Earle Gell M.D.   On: 02/26/2015 14:16   Dg Shoulder Left  02/26/2015   CLINICAL DATA:  Fall. Left shoulder injury and pain. Initial encounter.  EXAM: LEFT SHOULDER - 2+ VIEW  COMPARISON:  08/14/2013  FINDINGS: Anterior inferior dislocation of the humeral head is seen. No evidence of acute fracture. Mild degenerative spurring of the humeral head noted.  IMPRESSION: Anterior inferior shoulder dislocation. No acute fracture identified.   Electronically Signed   By: Earle Gell M.D.   On: 02/26/2015 14:15   Of note -she has never dislocated shoulder before  Jammed it once and it got better on  it's own   Patient Active Problem List   Diagnosis Date Noted  . Infection of skin of finger 08/25/2014  . Obesity (BMI 30-39.9) 10/01/2013  . Encounter for Medicare annual wellness exam 09/30/2013  . Estrogen deficiency 09/17/2012  . Special screening for malignant neoplasms, colon 12/15/2010  . Other screening mammogram 12/15/2010  . BACK PAIN WITH RADICULOPATHY 09/06/2010  . OSTEOARTHRITIS, KNEE, RIGHT 10/11/2008  . Internal hemorrhoids 09/02/2008  . Hypertension 04/28/2008  . PNEUMONIA, HX OF 03/03/2008  . HYPERLIPIDEMIA 02/26/2008   Past Medical History  Diagnosis Date  . Pneumonia   . Arthritis     osteo arthritis of right knee   Past Surgical History  Procedure Laterality Date  . Abdominal hysterectomy    . Breast surgery     Social History  Substance Use Topics  . Smoking status: Never Smoker   . Smokeless tobacco: Never Used  . Alcohol Use: No   Family History  Problem Relation Age of Onset  . Breast cancer Sister    Allergies  Allergen Reactions  . Atorvastatin Other (See Comments)    Ache in back and legs  . Hctz [Hydrochlorothiazide] Nausea And Vomiting    Nausea and vomiting    Current Outpatient Prescriptions on File Prior to Visit  Medication Sig Dispense Refill  .  diphenhydrAMINE (BENADRYL) 25 mg capsule Take 25 mg by mouth as needed for itching.     Marland Kitchen lisinopril (PRINIVIL,ZESTRIL) 10 MG tablet Take 5 mg by mouth daily.    . naproxen (NAPROSYN) 500 MG tablet Take 1 tablet (500 mg total) by mouth 2 (two) times daily with a meal. 30 tablet 0   No current facility-administered medications on file prior to visit.    Review of Systems    Review of Systems  Constitutional: Negative for fever, appetite change, fatigue and unexpected weight change.  Eyes: Negative for pain and visual disturbance.  Respiratory: Negative for cough and shortness of breath.   Cardiovascular: Negative for cp or palpitations    Gastrointestinal: Negative for nausea,  diarrhea and constipation.  Genitourinary: Negative for urgency and frequency.  Skin: Negative for pallor or rash   MSK pos for L shoulder pain that is much improved  Neurological: Negative for weakness, light-headedness, numbness and headaches.  Hematological: Negative for adenopathy. Does not bruise/bleed easily.  Psychiatric/Behavioral: Negative for dysphoric mood. The patient is not nervous/anxious.      Objective:   Physical Exam  Constitutional: She appears well-developed and well-nourished. No distress.  obese and well appearing Wearing a sling on L arm  HENT:  Head: Normocephalic and atraumatic.  Eyes: Conjunctivae and EOM are normal. Pupils are equal, round, and reactive to light.  Neck: Normal range of motion. Neck supple.  Nl rom neck  Cardiovascular: Normal rate, regular rhythm and normal heart sounds.   Pulmonary/Chest: Effort normal and breath sounds normal. No respiratory distress. She has no wheezes. She has no rales.  Musculoskeletal: She exhibits tenderness. She exhibits no edema.       Left shoulder: She exhibits decreased range of motion and tenderness. She exhibits no swelling, no effusion, no crepitus, no deformity, normal pulse and normal strength.  L shoulder - active rom 60 degrees abduction  Passive rom over 90 deg abduction and full flex and extension Mild acromion tenderness No swelling or deformity or ecchymosis   Nl use of hand and forearm     Neurological: She is alert. She has normal reflexes. She displays no atrophy. No cranial nerve deficit. She exhibits normal muscle tone. Coordination normal.  Skin: Skin is warm and dry. No rash noted. No erythema. No pallor.  Psychiatric: She has a normal mood and affect.          Assessment & Plan:   Problem List Items Addressed This Visit    Dislocation of left shoulder joint - Primary    Pt seen on 8/20 in ED and it was reduced with anesthesia  Rev ED notes and xray  She is doing much better -in a  sling and eager to get out of it  ROM is fair and pt is not having much pain  Will continue naproxen (watching bp) Enc use of cold compress when able for 10 minutes  Can take sling off for short periods for some passive range of motion  F/u appt made with Dr Lorelei Pont for this for a re check - may need PT in the future        Hypertension    bp is improving at home per pt - until she dislocated shoulder and started taking naproxen  BP: 140/80 mmHg  Here on 2nd check  Feels better on lower dose of ace  Will f/u late Sept with her cuff for a re check       Relevant Medications  amLODipine (NORVASC) 10 MG tablet

## 2015-03-01 NOTE — Progress Notes (Signed)
Pre visit review using our clinic review tool, if applicable. No additional management support is needed unless otherwise documented below in the visit note. 

## 2015-03-15 ENCOUNTER — Ambulatory Visit: Payer: Self-pay | Admitting: Family Medicine

## 2015-03-16 ENCOUNTER — Ambulatory Visit (INDEPENDENT_AMBULATORY_CARE_PROVIDER_SITE_OTHER): Payer: Medicare PPO | Admitting: Family Medicine

## 2015-03-16 ENCOUNTER — Encounter: Payer: Self-pay | Admitting: Family Medicine

## 2015-03-16 VITALS — BP 154/82 | HR 73 | Temp 98.6°F | Ht 61.25 in | Wt 179.5 lb

## 2015-03-16 DIAGNOSIS — S43005D Unspecified dislocation of left shoulder joint, subsequent encounter: Secondary | ICD-10-CM

## 2015-03-16 NOTE — Progress Notes (Signed)
Dr. Frederico Hamman T. Shaneece Stockburger, MD, [facility] Medicine [place], [address] Phone: [phone] Fax: [phone]  03/16/2015  Patient: Erica Carlson, MRN: [id number], DOB: 27-Sep-1945, 69 y.o.  Primary Physician:  Loura Pardon, MD  Chief Complaint: Arm Pain  Subjective:   Erica Carlson is a 69 y.o. very pleasant female patient who presents with the following:  F/u 8/20 Shoulder dislocation.   Out of sling right now. Out of it for about 2-3 hours a day.  The patient describes having an instant where she fell and had an anteroinferior shoulder dislocation which was ultimately reduced in the emergency room. She has been wearing a sling now for 2 and a half weeks.  She still is having some pain and limitation in her movement. She is wearing her sling most the time at this point. She has not had any significant prior dislocations of that left shoulder. She is right-hand dominant.   Past Medical History, Surgical History, Social History, Family History, Problem List, Medications, and Allergies have been reviewed and updated if relevant.  Patient Active Problem List   Diagnosis Date Noted  . Dislocation of left shoulder joint 03/01/2015  . Infection of skin of finger 08/25/2014  . Obesity (BMI 30-39.9) 10/01/2013  . Encounter for Medicare annual wellness exam 09/30/2013  . Estrogen deficiency 09/17/2012  . Special screening for malignant neoplasms, colon 12/15/2010  . Other screening mammogram 12/15/2010  . BACK PAIN WITH RADICULOPATHY 09/06/2010  . OSTEOARTHRITIS, KNEE, RIGHT 10/11/2008  . Internal hemorrhoids 09/02/2008  . Hypertension 04/28/2008  . PNEUMONIA, HX OF 03/03/2008  . HYPERLIPIDEMIA 02/26/2008    Past Medical History  Diagnosis Date  . Pneumonia   . Arthritis     osteo arthritis of right knee    Past Surgical History  Procedure Laterality Date  . Abdominal hysterectomy    . Breast surgery      Social  History   Social History  . Marital Status: Widowed    Spouse Name: n/a  . Number of Children: 6  . Years of Education: 14   Occupational History  . 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 Smoking status: Never Smoker   . Smokeless tobacco: Never Used  . Alcohol Use: No  . Drug Use: No  . Sexual Activity: Not on file   Other Topics Concern  . Not on file   Social History Narrative   Lives alone.   Her adult children live nearby.    Family History  Problem Relation Age of Onset  . Breast cancer Sister     Allergies  Allergen Reactions  . Atorvastatin Other (See Comments)    Ache in back and legs  . Hctz [Hydrochlorothiazide] Nausea And Vomiting    Nausea and vomiting     Medication list reviewed and updated in full in [place].  GEN: No fevers, chills. Nontoxic. Primarily MSK c/o today. MSK: Detailed in the HPI GI: tolerating PO intake without difficulty Neuro: No numbness, parasthesias, or tingling associated. Otherwise the pertinent positives of the ROS are noted above.   Objective:   BP 154/82 mmHg  Pulse 73  Temp(Src) 98.6 F (37 C) (Oral)  Ht 5' 1.25" (1.556 m)  Wt 179 lb 8 oz (81.421 kg)  BMI 33.63 kg/m2   GEN: WDWN, NAD, Non-toxic, Alert & Oriented x 3 HEENT: Atraumatic, Normocephalic.  Ears and Nose: No external  deformity. EXTR: No clubbing/cyanosis/edema NEURO: Normal gait.  PSYCH: Normally interactive. Conversant. Not depressed or anxious appearing.  Calm demeanor.    Cervical spine, full range of motion.  Nontender along the clavicle and nontender at the acromioclavicular joint. Nontender in the bicipital groove. Nontender along the humerus.  Some pain with abduction and flexion. Active abduction is limited to 105, but passively, 140 can be achieved. Flexion is limited actively to 105, but actively 140 can be achieved.  Abduction strength is 4 out of 5 Flexion is 4 out of 5 Internal and  external rotation are 5/5.  Drop test is negative. Sulcus negative  Apprehension test pos  Radiology: Dg Shoulder Left  02/26/2015   CLINICAL DATA:  Status post reduction of left shoulder  EXAM: LEFT SHOULDER - 2+ VIEW  COMPARISON:  February 26, 2015  FINDINGS: The previously noted dislocation has been reduced. No fracture is identified.  IMPRESSION: The previously noted dislocation has been reduced. No fracture is identified.   Electronically Signed   By: Abelardo Diesel M.D.   On: 02/26/2015 15:33   Dg Shoulder Left  02/26/2015   CLINICAL DATA:  Shoulder dislocation.  Status postreduction.  EXAM: LEFT SHOULDER - 2+ VIEW  COMPARISON:  Prior today  FINDINGS: Persistent anterior inferior dislocation of the humeral head is seen. No acute fracture identified.  IMPRESSION: Persistent anterior -inferior dislocation of the humeral head.   Electronically Signed   By: Earle Gell M.D.   On: 02/26/2015 14:16   Dg Shoulder Left  02/26/2015   CLINICAL DATA:  Fall. Left shoulder injury and pain. Initial encounter.  EXAM: LEFT SHOULDER - 2+ VIEW  COMPARISON:  08/14/2013  FINDINGS: Anterior inferior dislocation of the humeral head is seen. No evidence of acute fracture. Mild degenerative spurring of the humeral head noted.  IMPRESSION: Anterior inferior shoulder dislocation. No acute fracture identified.   Electronically Signed   By: Earle Gell M.D.   On: 02/26/2015 14:15    Assessment and Plan:   Dislocation of left shoulder joint, subsequent encounter - Plan: Ambulatory referral to Physical Therapy  Left-sided shoulder acute dislocation, status post reduction in the emergency room. Continue sling for a full 3 weeks, then wean out of it over the next 2 days.  Began physical therapy next week.  Reassess in 1 month range of motion and strength.  Follow-up: 1 mo  New Prescriptions   No medications on file   Orders Placed This Encounter  Procedures  . Ambulatory referral to Physical Therapy     Signed,  Frederico Hamman T. Jenisse Vullo, MD   Patient's Medications  New Prescriptions   No medications on file  Previous Medications   AMLODIPINE (NORVASC) 10 MG TABLET    Take 10 mg by mouth daily.   DIPHENHYDRAMINE (BENADRYL) 25 MG CAPSULE    Take 25 mg by mouth as needed for itching.    LISINOPRIL (PRINIVIL,ZESTRIL) 10 MG TABLET    Take 5 mg by mouth daily.  Modified Medications   No medications on file  Discontinued Medications   NAPROXEN (NAPROSYN) 500 MG TABLET    Take 1 tablet (500 mg total) by mouth 2 (two) times daily with a meal.

## 2015-03-16 NOTE — Progress Notes (Signed)
Pre visit review using our clinic review tool, if applicable. No additional management support is needed unless otherwise documented below in the visit note. 

## 2015-03-16 NOTE — Patient Instructions (Signed)
1. OK to stop wearing your sling next Monday.  2. We will have to start some range of motion and rehab on your shoulder.  3. Physical therapy really helpful with this condition.  REFERRALS TO SPECIALISTS, SPECIAL TESTS (MRI, CT, ULTRASOUNDS)  Erica Carlson or Erica Carlson will help you. ASK CHECK-IN FOR HELP.  Imaging / Special Testing referrals sometimes can be done same day if EMERGENCY, but others can take 2 or 3 days to get an appointment. Starting in 2015, many of the new Medicare plans and Obamacare plans take much longer.   Specialist appointment times vary a great deal, based on their schedule / openings. -- Some specialists have very long wait times. (Example. Dermatology. Multiple months  for non-cancer)

## 2015-03-18 ENCOUNTER — Telehealth: Payer: Self-pay | Admitting: Family Medicine

## 2015-03-18 NOTE — Telephone Encounter (Signed)
That is fine 

## 2015-03-18 NOTE — Telephone Encounter (Signed)
Patient was referred by Dr.Copland for physical therapy.  Patient has an appointment on 03/21/15, but she is cancellling the appointment because she can't afford the co-pay.  She wanted to let Dr.Copland know that she's cancelling the appointment.

## 2015-03-18 NOTE — Telephone Encounter (Signed)
This is not ideal.   If she cannot do this, then f/u with me in 2-3 weeks and I will try to advance her rehab myself when ROM improves.

## 2015-03-18 NOTE — Telephone Encounter (Signed)
She is already scheduled to follow up with you on 04/18/2015.  Is that okay or do I need to move that appointment up?  Please advise.

## 2015-04-04 ENCOUNTER — Ambulatory Visit: Payer: Self-pay | Admitting: Family Medicine

## 2015-04-13 ENCOUNTER — Encounter (INDEPENDENT_AMBULATORY_CARE_PROVIDER_SITE_OTHER): Payer: Medicare PPO | Admitting: Ophthalmology

## 2015-04-13 DIAGNOSIS — H353131 Nonexudative age-related macular degeneration, bilateral, early dry stage: Secondary | ICD-10-CM

## 2015-04-13 DIAGNOSIS — H35033 Hypertensive retinopathy, bilateral: Secondary | ICD-10-CM

## 2015-04-13 DIAGNOSIS — H33021 Retinal detachment with multiple breaks, right eye: Secondary | ICD-10-CM | POA: Diagnosis not present

## 2015-04-13 DIAGNOSIS — H43813 Vitreous degeneration, bilateral: Secondary | ICD-10-CM | POA: Diagnosis not present

## 2015-04-13 DIAGNOSIS — H3530 Unspecified macular degeneration: Secondary | ICD-10-CM | POA: Diagnosis not present

## 2015-04-13 DIAGNOSIS — H2513 Age-related nuclear cataract, bilateral: Secondary | ICD-10-CM | POA: Diagnosis not present

## 2015-04-13 DIAGNOSIS — I10 Essential (primary) hypertension: Secondary | ICD-10-CM

## 2015-04-13 DIAGNOSIS — H43811 Vitreous degeneration, right eye: Secondary | ICD-10-CM | POA: Diagnosis not present

## 2015-04-13 DIAGNOSIS — H33311 Horseshoe tear of retina without detachment, right eye: Secondary | ICD-10-CM | POA: Diagnosis not present

## 2015-04-18 ENCOUNTER — Encounter: Payer: Self-pay | Admitting: Family Medicine

## 2015-04-18 ENCOUNTER — Ambulatory Visit (INDEPENDENT_AMBULATORY_CARE_PROVIDER_SITE_OTHER): Payer: Medicare PPO | Admitting: Family Medicine

## 2015-04-18 VITALS — BP 132/70 | HR 80 | Temp 98.6°F | Ht 61.25 in | Wt 178.0 lb

## 2015-04-18 DIAGNOSIS — S43085D Other dislocation of left shoulder joint, subsequent encounter: Secondary | ICD-10-CM

## 2015-04-18 DIAGNOSIS — S43005S Unspecified dislocation of left shoulder joint, sequela: Secondary | ICD-10-CM

## 2015-04-18 NOTE — Progress Notes (Signed)
Pre visit review using our clinic review tool, if applicable. No additional management support is needed unless otherwise documented below in the visit note. 

## 2015-04-18 NOTE — Progress Notes (Signed)
Dr. Frederico Hamman T. Justin Meisenheimer, MD, [facility] Medicine [place], [address] Phone: [phone] Fax: [phone]  04/18/2015  Patient: Erica Carlson, MRN: [id number], DOB: 05-07-1946, 69 y.o.  Primary Physician:  Loura Pardon, MD  Chief Complaint: Follow-up  Subjective:   Erica Carlson is a 69 y.o. very pleasant female patient who presents with the following:  Here in f/u post non-operative management of acute L shoulder dislocation, treated in a sling, then progressed out. ROM has improved and str improved a lot. Unable to go to PT due to finances.   03/18/2015 Last OV with Owens Loffler, MD  F/u 8/20 Shoulder dislocation.   Out of sling right now. Out of it for about 2-3 hours a day.  The patient describes having an instant where she fell and had an anteroinferior shoulder dislocation which was ultimately reduced in the emergency room. She has been wearing a sling now for 2 and a half weeks.  She still is having some pain and limitation in her movement. She is wearing her sling most the time at this point. She has not had any significant prior dislocations of that left shoulder. She is right-hand dominant.   Past Medical History, Surgical History, Social History, Family History, Problem List, Medications, and Allergies have been reviewed and updated if relevant.  Patient Active Problem List   Diagnosis Date Noted  . Dislocation of left shoulder joint 03/01/2015  . Infection of skin of finger 08/25/2014  . Obesity (BMI 30-39.9) 10/01/2013  . Encounter for Medicare annual wellness exam 09/30/2013  . Estrogen deficiency 09/17/2012  . Special screening for malignant neoplasms, colon 12/15/2010  . Other screening mammogram 12/15/2010  . BACK PAIN WITH RADICULOPATHY 09/06/2010  . OSTEOARTHRITIS, KNEE, RIGHT 10/11/2008  . Internal hemorrhoids 09/02/2008  . Hypertension 04/28/2008  . PNEUMONIA, HX OF 03/03/2008  . HYPERLIPIDEMIA  02/26/2008    Past Medical History  Diagnosis Date  . Pneumonia   . Arthritis     osteo arthritis of right knee    Past Surgical History  Procedure Laterality Date  . Abdominal hysterectomy    . Breast surgery      Social History   Social History  . Marital Status: Widowed    Spouse Name: n/a  . Number of Children: 6  . Years of Education: 14   Occupational History  . 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 Smoking status: Never Smoker   . Smokeless tobacco: Never Used  . Alcohol Use: No  . Drug Use: No  . Sexual Activity: Not on file   Other Topics Concern  . Not on file   Social History Narrative   Lives alone.   Her adult children live nearby.    Family History  Problem Relation Age of Onset  . Breast cancer Sister     Allergies  Allergen Reactions  . Atorvastatin Other (See Comments)    Ache in back and legs  . Hctz [Hydrochlorothiazide] Nausea And Vomiting    Nausea and vomiting     Medication list reviewed and updated in full in [place].  GEN: No fevers, chills. Nontoxic. Primarily MSK c/o today. MSK: Detailed in the HPI GI: tolerating PO intake without difficulty Neuro: No numbness, parasthesias, or tingling associated. Otherwise the pertinent positives of the ROS are noted above.   Objective:   BP 132/70 mmHg  Pulse 80  Temp(Src)  98.6 F (37 C) (Oral)  Ht 5' 1.25" (1.556 m)  Wt 178 lb (80.74 kg)  BMI 33.35 kg/m2   GEN: WDWN, NAD, Non-toxic, Alert & Oriented x 3 HEENT: Atraumatic, Normocephalic.  Ears and Nose: No external deformity. EXTR: No clubbing/cyanosis/edema NEURO: Normal gait.  PSYCH: Normally interactive. Conversant. Not depressed or anxious appearing.  Calm demeanor.    Cervical spine, full range of motion.  Nontender along the clavicle and nontender at the acromioclavicular joint. Nontender in the bicipital groove. Nontender along the humerus.  ROM approaching  full  Abduction strength is 4++ out of 5 Flexion is 5 out of 5 Internal and external rotation are 5/5.  Drop test is negative. Sulcus negative  Apprehension test pos  Radiology: No results found.  Assessment and Plan:   Dislocation of left shoulder joint, sequela  >15 minutes spent in face to face time with patient, >50% spent in counselling or coordination of care  I reviewed with the patient a handout from [employer] and [employer] regarding their condition and a set of home exercises.  I reviewed all of these in detail and rehab POC over the next couple of months myself with the patient.  Signed,  Maud Deed. Macguire Holsinger, MD   Patient's Medications  New Prescriptions   No medications on file  Previous Medications   AMLODIPINE (NORVASC) 10 MG TABLET    Take 10 mg by mouth daily.   DIPHENHYDRAMINE (BENADRYL) 25 MG CAPSULE    Take 25 mg by mouth as needed for itching.    LISINOPRIL (PRINIVIL,ZESTRIL) 10 MG TABLET    Take 5 mg by mouth daily.   MULTIPLE VITAMINS-MINERALS (PRESERVISION AREDS 2) CAPS    Take 2 capsules by mouth daily.  Modified Medications   No medications on file  Discontinued Medications   No medications on file

## 2015-04-25 ENCOUNTER — Ambulatory Visit (INDEPENDENT_AMBULATORY_CARE_PROVIDER_SITE_OTHER): Payer: Medicare PPO | Admitting: Ophthalmology

## 2015-04-25 DIAGNOSIS — H33301 Unspecified retinal break, right eye: Secondary | ICD-10-CM

## 2015-08-26 ENCOUNTER — Encounter (INDEPENDENT_AMBULATORY_CARE_PROVIDER_SITE_OTHER): Payer: Medicare Other | Admitting: Ophthalmology

## 2015-08-26 DIAGNOSIS — H33301 Unspecified retinal break, right eye: Secondary | ICD-10-CM | POA: Diagnosis not present

## 2015-08-26 DIAGNOSIS — H353121 Nonexudative age-related macular degeneration, left eye, early dry stage: Secondary | ICD-10-CM | POA: Diagnosis not present

## 2015-08-26 DIAGNOSIS — I10 Essential (primary) hypertension: Secondary | ICD-10-CM

## 2015-08-26 DIAGNOSIS — H35033 Hypertensive retinopathy, bilateral: Secondary | ICD-10-CM | POA: Diagnosis not present

## 2015-08-26 DIAGNOSIS — H43813 Vitreous degeneration, bilateral: Secondary | ICD-10-CM | POA: Diagnosis not present

## 2015-08-26 DIAGNOSIS — H353112 Nonexudative age-related macular degeneration, right eye, intermediate dry stage: Secondary | ICD-10-CM

## 2015-08-26 DIAGNOSIS — H2513 Age-related nuclear cataract, bilateral: Secondary | ICD-10-CM

## 2016-01-30 ENCOUNTER — Other Ambulatory Visit: Payer: Self-pay | Admitting: Family Medicine

## 2016-01-30 DIAGNOSIS — Z1231 Encounter for screening mammogram for malignant neoplasm of breast: Secondary | ICD-10-CM

## 2016-02-01 ENCOUNTER — Ambulatory Visit
Admission: RE | Admit: 2016-02-01 | Discharge: 2016-02-01 | Disposition: A | Discharge status: Home / Self Care | Payer: Medicare Other | Source: Ambulatory Visit | Attending: Family Medicine | Admitting: Family Medicine

## 2016-02-01 DIAGNOSIS — Z1231 Encounter for screening mammogram for malignant neoplasm of breast: Secondary | ICD-10-CM

## 2016-02-07 ENCOUNTER — Other Ambulatory Visit: Payer: Self-pay | Admitting: Family Medicine

## 2016-02-07 DIAGNOSIS — R928 Other abnormal and inconclusive findings on diagnostic imaging of breast: Secondary | ICD-10-CM

## 2016-02-07 HISTORY — PX: AXILLARY LYMPH NODE BIOPSY: SHX5737

## 2016-02-13 ENCOUNTER — Other Ambulatory Visit: Payer: Self-pay | Admitting: Family Medicine

## 2016-02-13 ENCOUNTER — Ambulatory Visit
Admission: RE | Admit: 2016-02-13 | Discharge: 2016-02-13 | Disposition: A | Discharge status: Home / Self Care | Payer: Medicare Other | Source: Ambulatory Visit | Attending: Family Medicine | Admitting: Family Medicine

## 2016-02-13 DIAGNOSIS — R928 Other abnormal and inconclusive findings on diagnostic imaging of breast: Secondary | ICD-10-CM

## 2016-02-13 DIAGNOSIS — R599 Enlarged lymph nodes, unspecified: Secondary | ICD-10-CM

## 2016-02-20 ENCOUNTER — Other Ambulatory Visit: Payer: Self-pay | Admitting: Family Medicine

## 2016-02-20 DIAGNOSIS — R599 Enlarged lymph nodes, unspecified: Secondary | ICD-10-CM

## 2016-02-21 ENCOUNTER — Other Ambulatory Visit (HOSPITAL_COMMUNITY)
Admission: RE | Admit: 2016-02-21 | Discharge: 2016-02-21 | Disposition: A | Discharge status: Home / Self Care | Payer: Medicare Other | Source: Ambulatory Visit | Attending: Diagnostic Radiology | Admitting: Diagnostic Radiology

## 2016-02-21 ENCOUNTER — Ambulatory Visit
Admission: RE | Admit: 2016-02-21 | Discharge: 2016-02-21 | Disposition: A | Discharge status: Home / Self Care | Payer: Medicare Other | Source: Ambulatory Visit | Attending: Family Medicine | Admitting: Family Medicine

## 2016-02-21 DIAGNOSIS — C8590 Non-Hodgkin lymphoma, unspecified, unspecified site: Secondary | ICD-10-CM | POA: Diagnosis not present

## 2016-02-21 DIAGNOSIS — R599 Enlarged lymph nodes, unspecified: Secondary | ICD-10-CM

## 2016-02-21 DIAGNOSIS — R590 Localized enlarged lymph nodes: Secondary | ICD-10-CM | POA: Diagnosis present

## 2016-02-24 ENCOUNTER — Telehealth: Payer: Self-pay | Admitting: Hematology and Oncology

## 2016-02-24 NOTE — Telephone Encounter (Signed)
Pt confirmed appt, in take completed, contact breast center with appt date/time.

## 2016-02-28 ENCOUNTER — Ambulatory Visit (HOSPITAL_BASED_OUTPATIENT_CLINIC_OR_DEPARTMENT_OTHER): Payer: Medicare Other | Admitting: Hematology and Oncology

## 2016-02-28 ENCOUNTER — Other Ambulatory Visit: Payer: Medicare Other

## 2016-02-28 ENCOUNTER — Telehealth: Payer: Self-pay | Admitting: Hematology and Oncology

## 2016-02-28 ENCOUNTER — Encounter: Payer: Self-pay | Admitting: Hematology and Oncology

## 2016-02-28 DIAGNOSIS — Z803 Family history of malignant neoplasm of breast: Secondary | ICD-10-CM | POA: Diagnosis not present

## 2016-02-28 DIAGNOSIS — C8300 Small cell B-cell lymphoma, unspecified site: Secondary | ICD-10-CM | POA: Insufficient documentation

## 2016-02-28 NOTE — Assessment & Plan Note (Signed)
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 We discussed the definition of lymphomas and the different types of lymphomas. I explained the significance of small lymphocytic lymphoma and how it can be similar to his chronic lymphocytic leukemia.  Staging: I discussed the staging would depend on extent of involvement. It would be considered stage I if only the axillary lymph nodes are involved. Treatment: SLL is generally managed based upon symptoms and extent of involvement. Since the patient is asymptomatic, I would recommend watchful waiting and monitoring.  Blood work with CBC with differential, CMP, LDH, SPEP will be performed today. I did not recommend additional CT scans primarily because based on NCCN, then mainly indicated when we are ready to initiate treatment. I will request pathology to perform cytogenetics and FISH panel for CLL prognostic evaluation.

## 2016-02-28 NOTE — Progress Notes (Signed)
[facility] CONSULT NOTE  Patient Care Team: Abner Greenspan, MD as PCP - General (Family Medicine)  CHIEF COMPLAINTS/PURPOSE OF CONSULTATION:  Small lymphocytic lymphoma  HISTORY OF PRESENTING ILLNESS:  Erica Carlson 70 y.o. female is here because of recent diagnosis of small lymphocytic lymphoma involving the left axillary lymph nodes. Patient had a routine screening mammogram which identified enlarged lymph nodes in the axilla. She then underwent a biopsy which revealed small lymphocytic lymphoma that was CD20 positive and CD5 positive. Patient has not had any symptoms related to lymphoma. She denies any fevers chills, night sweats, weight loss, pain in the lymph nodes etc.    I reviewed her records extensively and collaborated the history with the patient.  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 Arthritis    osteo arthritis of right knee  . Pneumonia     SURGICAL HISTORY: Past Surgical History:  Procedure Laterality Date  . ABDOMINAL HYSTERECTOMY    . BREAST SURGERY      SOCIAL HISTORY: Social History   Social History  . Marital status: Widowed    Spouse name: n/a  . Number of children: 6  . Years of education: 53   Occupational History  . self-employed IT trainer    [employer]   Social History Main Topics  . Smoking status: Never Smoker  . Smokeless tobacco: Never Used  . Alcohol use No  . Drug use: No  . Sexual activity: Not on file   Other Topics Concern  . Not on file   Social History Narrative   Lives alone.   Her adult children live nearby.    FAMILY HISTORY: Family History  Problem Relation Age of Onset  . Breast cancer Sister     ALLERGIES:  is allergic to atorvastatin and hctz  [hydrochlorothiazide].  MEDICATIONS:  Current Outpatient Prescriptions  Medication Sig Dispense Refill  . amLODipine (NORVASC) 10 MG tablet Take 10 mg by mouth daily.    . diphenhydrAMINE (BENADRYL) 25 mg capsule Take 25 mg by mouth as needed for itching.     Marland Kitchen lisinopril (PRINIVIL,ZESTRIL) 10 MG tablet Take 5 mg by mouth daily.    . Multiple Vitamins-Minerals (PRESERVISION AREDS 2) CAPS Take 2 capsules by mouth daily.     No current facility-administered medications for this visit.     REVIEW OF SYSTEMS:   Constitutional: Denies fevers, chills or abnormal night sweats Eyes: Denies blurriness of vision, double vision or watery eyes Ears, nose, mouth, throat, and face: Denies mucositis or sore throat Respiratory: Denies cough, dyspnea or wheezes Cardiovascular: Denies palpitation, chest discomfort or lower extremity swelling Gastrointestinal:  Denies nausea, heartburn or change in bowel habits Skin: Denies abnormal skin rashes Lymphatics: Denies new lymphadenopathy or easy bruising Neurological:Denies numbness, tingling or new weaknesses Behavioral/Psych: Mood is stable, no new changes   All other systems were reviewed with the patient and are negative.  PHYSICAL EXAMINATION: ECOG PERFORMANCE STATUS: 0 - Asymptomatic  Vitals:   02/28/16 1607  BP: (!) 174/86  Pulse: 80  Resp: 18  Temp: 98.3 F (36.8 C)   Filed Weights   02/28/16 1607  Weight: 172 lb 8 oz (78.2 kg)    GENERAL:alert, no distress and comfortable SKIN:  skin color, texture, turgor are normal, no rashes or significant lesions EYES: normal, conjunctiva are pink and non-injected, sclera clear OROPHARYNX:no exudate, no erythema and lips, buccal mucosa, and tongue normal  NECK: supple, thyroid normal size, non-tender, without nodularity LYMPH:  Palpable bilateral posterior cervical lymphadenopathy, no axillary lymphadenopathy could be palpated. LUNGS: clear to auscultation and percussion with normal breathing  effort HEART: regular rate & rhythm and no murmurs and no lower extremity edema ABDOMEN:abdomen soft, non-tender and normal bowel sounds, no hepatosplenomegaly Musculoskeletal:no cyanosis of digits and no clubbing  PSYCH: alert & oriented x 3 with fluent speech NEURO: no focal motor/sensory deficits  LABORATORY DATA:  I have reviewed the data as listed Lab Results  Component Value Date   WBC 7.3 09/17/2013   HGB 12.9 09/17/2013   HCT 39.4 09/17/2013   MCV 81.5 09/17/2013   PLT 319.0 09/17/2013   Lab Results  Component Value Date   NA 138 12/07/2014   K 4.0 12/07/2014   CL 106 12/07/2014   CO2 28 12/07/2014    RADIOGRAPHIC STUDIES: I have personally reviewed the radiological reports and agreed with the findings in the report.  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 We discussed the definition of lymphomas and the different types of lymphomas. I explained the significance of small lymphocytic lymphoma and how it can be similar to his chronic lymphocytic leukemia.  Staging: I discussed the staging would depend on extent of involvement. Because of patient has axillary and cervical lymph nodes, she has stage II A disease.  Treatment: SLL is generally managed based upon symptoms and extent of involvement. Since the patient is asymptomatic, I would recommend watchful waiting and monitoring.  Blood work with CBC with differential, CMP, LDH, SPEP will be performed today. I did not recommend additional CT scans primarily because based on NCCN, then mainly indicated when we are ready to initiate treatment. I will request pathology to perform cytogenetics and FISH panel for CLL prognostic evaluation.   Return to clinic in 6 months with labs and follow-up or sooner if she develops any  symptoms.  All questions were answered. The patient knows to call the clinic with any problems, questions or concerns.    Rulon Eisenmenger, MD 02/28/16

## 2016-02-28 NOTE — Telephone Encounter (Signed)
appt made and avs printed. Pt will return tomorrow for labs due to lab already closed today

## 2016-02-29 ENCOUNTER — Other Ambulatory Visit (HOSPITAL_BASED_OUTPATIENT_CLINIC_OR_DEPARTMENT_OTHER): Payer: Medicare Other

## 2016-02-29 DIAGNOSIS — C8300 Small cell B-cell lymphoma, unspecified site: Secondary | ICD-10-CM | POA: Diagnosis not present

## 2016-02-29 LAB — CBC WITH DIFFERENTIAL/PLATELET
BASO%: 0.3 % (ref 0.0–2.0)
Basophils Absolute: 0 10*3/uL (ref 0.0–0.1)
EOS%: 5.3 % (ref 0.0–7.0)
Eosinophils Absolute: 0.4 10*3/uL (ref 0.0–0.5)
HEMATOCRIT: 39 % (ref 34.8–46.6)
HGB: 12.5 g/dL (ref 11.6–15.9)
LYMPH#: 3.7 10*3/uL — AB (ref 0.9–3.3)
LYMPH%: 53.9 % — ABNORMAL HIGH (ref 14.0–49.7)
MCH: 26.5 pg (ref 25.1–34.0)
MCHC: 32.1 g/dL (ref 31.5–36.0)
MCV: 82.6 fL (ref 79.5–101.0)
MONO#: 0.7 10*3/uL (ref 0.1–0.9)
MONO%: 9.7 % (ref 0.0–14.0)
NEUT#: 2.1 10*3/uL (ref 1.5–6.5)
NEUT%: 30.8 % — AB (ref 38.4–76.8)
Platelets: 274 10*3/uL (ref 145–400)
RBC: 4.72 10*6/uL (ref 3.70–5.45)
RDW: 13.4 % (ref 11.2–14.5)
WBC: 6.9 10*3/uL (ref 3.9–10.3)

## 2016-02-29 LAB — COMPREHENSIVE METABOLIC PANEL
ALK PHOS: 69 U/L (ref 40–150)
ALT: 20 U/L (ref 0–55)
AST: 28 U/L (ref 5–34)
Albumin: 3.5 g/dL (ref 3.5–5.0)
Anion Gap: 6 mEq/L (ref 3–11)
BUN: 24.7 mg/dL (ref 7.0–26.0)
CALCIUM: 9.5 mg/dL (ref 8.4–10.4)
CHLORIDE: 108 meq/L (ref 98–109)
CO2: 25 mEq/L (ref 22–29)
CREATININE: 1.1 mg/dL (ref 0.6–1.1)
EGFR: 62 mL/min/{1.73_m2} — ABNORMAL LOW (ref >90)
Glucose: 94 mg/dl (ref 70–140)
POTASSIUM: 3.9 meq/L (ref 3.5–5.1)
SODIUM: 140 meq/L (ref 136–145)
Total Bilirubin: 0.3 mg/dL (ref 0.20–1.20)
Total Protein: 7.3 g/dL (ref 6.4–8.3)

## 2016-02-29 LAB — LACTATE DEHYDROGENASE: LDH: 268 U/L — AB (ref 125–245)

## 2016-02-29 LAB — TECHNOLOGIST REVIEW

## 2016-03-02 LAB — PROTEIN ELECTROPHORESIS, SERUM
A/G RATIO SPE: 1.2 (ref 0.7–1.7)
ALBUMIN: 3.6 g/dL (ref 2.9–4.4)
Alpha 1: 0.2 g/dL (ref 0.0–0.4)
Alpha 2: 0.6 g/dL (ref 0.4–1.0)
Beta: 1 g/dL (ref 0.7–1.3)
GAMMA GLOBULIN: 1.3 g/dL (ref 0.4–1.8)
GLOBULIN, TOTAL: 3.1 g/dL (ref 2.2–3.9)
TOTAL PROTEIN: 6.7 g/dL (ref 6.0–8.5)

## 2016-03-07 ENCOUNTER — Telehealth: Payer: Self-pay

## 2016-03-07 NOTE — Telephone Encounter (Signed)
I don't know what that is regarding.  Her last labs were done by oncology.

## 2016-03-07 NOTE — Telephone Encounter (Signed)
Legrand Como notified of Dr. Marliss Coots comments and he will check with her oncology office

## 2016-03-07 NOTE — Telephone Encounter (Signed)
Legrand Como with [facility] clinical pharmacist left v/m requesting status of faxed request for release of labs.Michael request cb.

## 2016-03-14 ENCOUNTER — Telehealth: Payer: Self-pay | Admitting: Hematology and Oncology

## 2016-03-14 NOTE — Telephone Encounter (Signed)
FAXED PT LABS TO [facility] [phone]

## 2016-06-26 ENCOUNTER — Telehealth: Payer: Self-pay | Admitting: Family Medicine

## 2016-06-26 DIAGNOSIS — Z1159 Encounter for screening for other viral diseases: Secondary | ICD-10-CM | POA: Insufficient documentation

## 2016-06-26 DIAGNOSIS — Z0000 Encounter for general adult medical examination without abnormal findings: Secondary | ICD-10-CM

## 2016-06-26 NOTE — Telephone Encounter (Signed)
-----   Message from Eustace Pen, LPN sent at [phone]  5:00 PM EST ----- Regarding: Lab Orders 06/27/16 Please add Hep C lab orders, if any. Thank you.

## 2016-06-27 ENCOUNTER — Ambulatory Visit (INDEPENDENT_AMBULATORY_CARE_PROVIDER_SITE_OTHER): Payer: Medicare Other

## 2016-06-27 ENCOUNTER — Other Ambulatory Visit: Payer: Self-pay

## 2016-06-27 VITALS — BP 124/82 | HR 79 | Temp 98.3°F | Ht 61.0 in | Wt 171.0 lb

## 2016-06-27 DIAGNOSIS — Z1159 Encounter for screening for other viral diseases: Secondary | ICD-10-CM | POA: Diagnosis not present

## 2016-06-27 DIAGNOSIS — Z0000 Encounter for general adult medical examination without abnormal findings: Secondary | ICD-10-CM | POA: Diagnosis not present

## 2016-06-27 LAB — COMPREHENSIVE METABOLIC PANEL
ALBUMIN: 4.3 g/dL (ref 3.5–5.2)
ALK PHOS: 63 U/L (ref 39–117)
ALT: 12 U/L (ref 0–35)
AST: 16 U/L (ref 0–37)
BUN: 19 mg/dL (ref 6–23)
CALCIUM: 10 mg/dL (ref 8.4–10.5)
CO2: 26 mEq/L (ref 19–32)
Chloride: 105 mEq/L (ref 96–112)
Creatinine, Ser: 0.94 mg/dL (ref 0.40–1.20)
GFR: 75.77 mL/min (ref >60.00)
Glucose, Bld: 100 mg/dL — ABNORMAL HIGH (ref 70–99)
POTASSIUM: 4.1 meq/L (ref 3.5–5.1)
Sodium: 141 mEq/L (ref 135–145)
Total Bilirubin: 0.4 mg/dL (ref 0.2–1.2)
Total Protein: 8.1 g/dL (ref 6.0–8.3)

## 2016-06-27 LAB — LIPID PANEL
CHOLESTEROL: 307 mg/dL — AB (ref 0–200)
HDL: 48.8 mg/dL (ref >39.00)
LDL Cholesterol: 235 mg/dL — ABNORMAL HIGH (ref 0–99)
NonHDL: 258.46
TRIGLYCERIDES: 116 mg/dL (ref 0.0–149.0)
Total CHOL/HDL Ratio: 6
VLDL: 23.2 mg/dL (ref 0.0–40.0)

## 2016-06-27 LAB — CBC WITH DIFFERENTIAL/PLATELET
BASOS ABS: 0 10*3/uL (ref 0.0–0.1)
BASOS PCT: 0.4 % (ref 0.0–3.0)
EOS ABS: 0.6 10*3/uL (ref 0.0–0.7)
Eosinophils Relative: 6.6 % — ABNORMAL HIGH (ref 0.0–5.0)
HCT: 41.1 % (ref 36.0–46.0)
Hemoglobin: 13.4 g/dL (ref 12.0–15.0)
LYMPHS PCT: 51.9 % — AB (ref 12.0–46.0)
Lymphs Abs: 4.9 10*3/uL — ABNORMAL HIGH (ref 0.7–4.0)
MCHC: 32.7 g/dL (ref 30.0–36.0)
MCV: 81 fl (ref 78.0–100.0)
MONO ABS: 0.3 10*3/uL (ref 0.1–1.0)
Monocytes Relative: 3.7 % (ref 3.0–12.0)
NEUTROS ABS: 3.5 10*3/uL (ref 1.4–7.7)
Neutrophils Relative %: 37.4 % — ABNORMAL LOW (ref 43.0–77.0)
Platelets: 325 10*3/uL (ref 150.0–400.0)
RBC: 5.08 Mil/uL (ref 3.87–5.11)
RDW: 13.9 % (ref 11.5–15.5)
WBC: 9.4 10*3/uL (ref 4.0–10.5)

## 2016-06-27 LAB — TSH: TSH: 1.11 u[IU]/mL (ref 0.35–4.50)

## 2016-06-27 NOTE — Progress Notes (Signed)
Pre visit review using our clinic review tool, if applicable. No additional management support is needed unless otherwise documented below in the visit note. 

## 2016-06-27 NOTE — Progress Notes (Signed)
PCP notes:   Health maintenance:  Flu vaccine - pt is ill today (wearing mask) PCV13 - pt is ill today Bone density - pt will discuss with PCP at next appt Shingles - pt will discuss necessity of vaccine with PCP due to hx of shingles Hep C screening - completed  Abnormal screenings:   None  Patient concerns:   None  Nurse concerns:  None  Next PCP appt:   07/04/16 @ 1430  I reviewed health advisor's note, was available for consultation, and agree with documentation and plan. Loura Pardon MD

## 2016-06-27 NOTE — Patient Instructions (Signed)
Erica Carlson , Thank you for taking time to come for your Medicare Wellness Visit. I appreciate your ongoing commitment to your health goals. Please review the following plan we discussed and let me know if I can assist you in the future.   These are the goals we discussed: Goals    . Increase physical activity          Starting 06/27/16, I will continue to exercise at least 60 min 3-4 days per week.        This is a list of the screening recommended for you and due dates:  Health Maintenance  Topic Date Due  . Flu Shot  10/06/2016*  . Pneumonia vaccines (1 of 2 - PCV13) 10/06/2016*  . DEXA scan (bone density measurement)  06/27/2017*  . Shingles Vaccine  06/27/2017*  . Mammogram  02/12/2017  . Colon Cancer Screening  04/02/2018  . Tetanus Vaccine  08/25/2024  .  Hepatitis C: One time screening is recommended by Center for Disease Control  (CDC) for  adults born from [birth year] through 1964.   Completed  *Topic was postponed. The date shown is not the original due date.   Preventive Care for Adults  A healthy lifestyle and preventive care can promote health and wellness. Preventive health guidelines for adults include the following key practices.  . A routine yearly physical is a good way to check with your health care provider about your health and preventive screening. It is a chance to share any concerns and updates on your health and to receive a thorough exam.  . Visit your dentist for a routine exam and preventive care every 6 months. Brush your teeth twice a day and floss once a day. Good oral hygiene prevents tooth decay and gum disease.  . The frequency of eye exams is based on your age, health, family medical history, use  of contact lenses, and other factors. Follow your health care provider's ecommendations for frequency of eye exams.  . Eat a healthy diet. Foods like vegetables, fruits, whole grains, low-fat dairy products, and lean protein foods contain the nutrients you  need without too many calories. Decrease your intake of foods high in solid fats, added sugars, and salt. Eat the right amount of calories for you. Get information about a proper diet from your health care provider, if necessary.  . Regular physical exercise is one of the most important things you can do for your health. Most adults should get at least 150 minutes of moderate-intensity exercise (any activity that increases your heart rate and causes you to sweat) each week. In addition, most adults need muscle-strengthening exercises on 2 or more days a week.  Silver Sneakers may be a benefit available to you. To determine eligibility, you may visit the website: www.silversneakers.com or contact program at [phone] Mon-Fri between 8AM-8PM.   . Maintain a healthy weight. The body mass index (BMI) is a screening tool to identify possible weight problems. It provides an estimate of body fat based on height and weight. Your health care provider can find your BMI and can help you achieve or maintain a healthy weight.   For adults 20 years and older: ? A BMI below 18.5 is considered underweight. ? A BMI of 18.5 to 24.9 is normal. ? A BMI of 25 to 29.9 is considered overweight. ? A BMI of 30 and above is considered obese.   . Maintain normal blood lipids and cholesterol levels by exercising and minimizing your intake of  saturated fat. Eat a balanced diet with plenty of fruit and vegetables. Blood tests for lipids and cholesterol should begin at age 26 and be repeated every 5 years. If your lipid or cholesterol levels are high, you are over 50, or you are at high risk for heart disease, you [place] need your cholesterol levels checked more frequently. Ongoing high lipid and cholesterol levels should be treated with medicines if diet and exercise are not working.  . If you smoke, find out from your health care provider how to quit. If you do not use tobacco, please do not start.  . If you choose to drink  alcohol, please do not consume more than 2 drinks per day. One drink is considered to be 12 ounces (355 mL) of beer, 5 ounces (148 mL) of wine, or 1.5 ounces (44 mL) of liquor.  . If you are 78-88 years old, ask your health care provider if you should take aspirin to prevent strokes.  . Use sunscreen. Apply sunscreen liberally and repeatedly throughout the day. You should seek shade when your shadow is shorter than you. Protect yourself by wearing long sleeves, pants, a wide-brimmed hat, and sunglasses year round, whenever you are outdoors.  . Once a month, do a whole body skin exam, using a mirror to look at the skin on your back. Tell your health care provider of new moles, moles that have irregular borders, moles that are larger than a pencil eraser, or moles that have changed in shape or color.

## 2016-06-27 NOTE — Progress Notes (Signed)
Subjective:   Erica Carlson is a 70 y.o. female who presents for Medicare Annual (Subsequent) preventive examination.  Review of Systems:  N/A Cardiac Risk Factors include: advanced age (>17men, >86 women);obesity (BMI >30kg/m2);hypertension     Objective:     Vitals: BP 124/82 (BP Location: Left Arm, Patient Position: Sitting, Cuff Size: Normal)   Pulse 79   Temp 98.3 F (36.8 C) (Oral)   Ht 5\' 1"  (1.549 m) Comment: no shoes  Wt 171 lb (77.6 kg)   SpO2 96%   BMI 32.31 kg/m   Body mass index is 32.31 kg/m.   Tobacco History  Smoking Status  . Never Smoker  Smokeless Tobacco  . Never Used     Counseling given: No   Past Medical History:  Diagnosis Date  . Arthritis    osteo arthritis of right knee  . Pneumonia    Past Surgical History:  Procedure Laterality Date  . ABDOMINAL HYSTERECTOMY    . AXILLARY LYMPH NODE BIOPSY Right 02/07/2016  . BREAST SURGERY     Family History  Problem Relation Age of Onset  . Breast cancer Sister    History  Sexual Activity  . Sexual activity: No    Outpatient Encounter Prescriptions as of 06/27/2016  Medication Sig  . amLODipine (NORVASC) 10 MG tablet Take 10 mg by mouth daily.  . Cholecalciferol (VITAMIN D3 PO) Take 2 capsules by mouth daily.  Marland Kitchen lisinopril (PRINIVIL,ZESTRIL) 10 MG tablet Take 5 mg by mouth daily.  . [DISCONTINUED] diphenhydrAMINE (BENADRYL) 25 mg capsule Take 25 mg by mouth as needed for itching.   . [DISCONTINUED] Multiple Vitamins-Minerals (PRESERVISION AREDS 2) CAPS Take 2 capsules by mouth daily.   No facility-administered encounter medications on file as of 06/27/2016.     Activities of Daily Living In your present state of health, do you have any difficulty performing the following activities: 06/27/2016  Hearing? N  Vision? N  Difficulty concentrating or making decisions? N  Walking or climbing stairs? N  Dressing or bathing? N  Doing errands, shopping? N  Preparing Food and eating ?  N  Using the Toilet? N  In the past six months, have you accidently leaked urine? N  Do you have problems with loss of bowel control? N  Managing your Medications? N  Managing your Finances? N  Housekeeping or managing your Housekeeping? N  Some recent data might be hidden    Patient Care Team: Abner Greenspan, MD as PCP - General (Family Medicine) Nicholas Lose, MD as Consulting Physician (Hematology and Oncology) Galvin Proffer, OD as Consulting Physician (Optometry)    Assessment:     Hearing Screening   125Hz  250Hz  500Hz  1000Hz  2000Hz  3000Hz  4000Hz  6000Hz  8000Hz   Right ear:   40 40 40  40    Left ear:   40 40 40  40    Vision Screening Comments: Last vision exam in July 2017 with [facility]   Exercise Activities and Dietary recommendations Current Exercise Habits: Home exercise routine, Type of exercise: strength training/weights;Other - see comments (cardio, core), Time (Minutes): 60, Frequency (Times/Week): 4, Weekly Exercise (Minutes/Week): 240, Intensity: Moderate, Exercise limited by: None identified  Goals    . Increase physical activity          Starting 06/27/16, I will continue to exercise at least 60 min 3-4 days per week.       Fall Risk Fall Risk  06/27/2016 10/25/2014 09/30/2013 09/17/2012  Falls in the past year?  No No No No   Depression Screen PHQ 2/9 Scores 06/27/2016 02/26/2015 09/30/2013 09/17/2012  PHQ - 2 Score 0 0 0 0     Cognitive Function MMSE - Mini Mental State Exam 06/27/2016  Orientation to time 5  Orientation to Place 5  Registration 3  Attention/ Calculation 0  Recall 3  Language- name 2 objects 0  Language- repeat 1  Language- follow 3 step command 3  Language- read & follow direction 0  Write a sentence 0  Copy design 0  Total score 20     PLEASE NOTE: A Mini-Cog screen was completed. Maximum score is 20. A value of 0 denotes this part of [place] MMSE was not completed or the patient failed this part of the  Mini-Cog screening.   Mini-Cog Screening Orientation to Time - Max 5 pts Orientation to Place - Max 5 pts Registration - Max 3 pts Recall - Max 3 pts Language Repeat - Max 1 pts Language Follow 3 Step Command - Max 3 pts     Immunization History  Administered Date(s) Administered  . Influenza Split 04/23/2012  . Influenza,inj,Quad PF,36+ Mos 04/29/2013  . Pneumococcal Polysaccharide-23 12/15/2010  . Td 03/03/2009  . Tdap 08/25/2014   Screening Tests Health Maintenance  Topic Date Due  . INFLUENZA VACCINE  10/06/2016 (Originally 02/07/2016)  . PNA vac Low Risk Adult (1 of 2 - PCV13) 10/06/2016 (Originally 12/15/2011)  . DEXA SCAN  06/27/2017 (Originally 09/28/2011)  . ZOSTAVAX  06/27/2017 (Originally 09/28/2006)  . MAMMOGRAM  02/12/2017  . COLONOSCOPY  04/02/2018  . TETANUS/TDAP  08/25/2024  . Hepatitis C Screening  Completed      Plan:     I have personally reviewed and addressed the Medicare Annual Wellness questionnaire and have noted the following in the patient's chart:  A. Medical and social history B. Use of alcohol, tobacco or illicit drugs  C. Current medications and supplements D. Functional ability and status E.  Nutritional status F.  Physical activity G. Advance directives H. List of other physicians I.  Hospitalizations, surgeries, and ER visits in previous 12 months J.  [place] to include hearing, vision, cognitive, depression L. Referrals and appointments - none  In addition, I have reviewed and discussed with patient certain preventive protocols, quality metrics, and best practice recommendations. A written personalized care plan for preventive services as well as general preventive health recommendations were provided to patient.  See attached scanned questionnaire for additional information.   Signed,   Lindell Noe, MHA, BS, LPN Health Coach

## 2016-06-28 LAB — HEPATITIS C ANTIBODY: HCV AB: NEGATIVE

## 2016-07-04 ENCOUNTER — Ambulatory Visit (INDEPENDENT_AMBULATORY_CARE_PROVIDER_SITE_OTHER): Payer: Medicare Other | Admitting: Family Medicine

## 2016-07-04 ENCOUNTER — Encounter: Payer: Self-pay | Admitting: Family Medicine

## 2016-07-04 VITALS — BP 130/70 | HR 83 | Temp 98.4°F | Ht 61.0 in | Wt 169.5 lb

## 2016-07-04 DIAGNOSIS — Z23 Encounter for immunization: Secondary | ICD-10-CM

## 2016-07-04 DIAGNOSIS — Z0000 Encounter for general adult medical examination without abnormal findings: Secondary | ICD-10-CM

## 2016-07-04 DIAGNOSIS — E669 Obesity, unspecified: Secondary | ICD-10-CM

## 2016-07-04 DIAGNOSIS — I10 Essential (primary) hypertension: Secondary | ICD-10-CM | POA: Diagnosis not present

## 2016-07-04 DIAGNOSIS — C8300 Small cell B-cell lymphoma, unspecified site: Secondary | ICD-10-CM

## 2016-07-04 DIAGNOSIS — E7800 Pure hypercholesterolemia, unspecified: Secondary | ICD-10-CM

## 2016-07-04 DIAGNOSIS — Z1159 Encounter for screening for other viral diseases: Secondary | ICD-10-CM

## 2016-07-04 MED ORDER — AMLODIPINE BESYLATE 10 MG PO TABS: 10.0000 mg | ORAL_TABLET | Freq: Every day | ORAL | 11 refills | Status: DC

## 2016-07-04 MED ORDER — LISINOPRIL 10 MG PO TABS: 5.0000 mg | ORAL_TABLET | Freq: Every day | ORAL | 11 refills | Status: DC

## 2016-07-04 MED ORDER — PNEUMOCOCCAL 13-VAL CONJ VACC IM SUSP
0.5000 mL | Freq: Once | INTRAMUSCULAR | Status: AC
  Administered 2016-07-04: 0.5 mL via INTRAMUSCULAR

## 2016-07-04 NOTE — Patient Instructions (Addendum)
If you are interested in a shingles/zoster vaccine - call your insurance to check on coverage,( you should not get it within 1 month of other vaccines) , then call us for a prescription  for it to take to a pharmacy that gives the shot , or make a nurse visit to get it here depending on your coverage  When you are ready to do a bone density test to screen for osteoporosis (dexa scan) let us know and we will schedule it Try to get 1200-1500 mg of calcium per day with at least 1000 iu of vitamin D - for bone health   Stop up front for referral to cardiology for your cholesterol   Flu and prevnar vaccines today   Take care of yourself !

## 2016-07-04 NOTE — Progress Notes (Signed)
Subjective:    Patient ID: Erica Carlson, female    DOB: 08/11/1945, 70 y.o.   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 Drinking more water  Slipped during the holidays /but eating well  bmi  32.0  AMW was this month   Flu vaccine -will get today   prevnar - due for that also   dexa - will think about - not interested now  Takes ca and D No falls or fx   Zoster vaccine -will check on coverage/has had zoster in the past   Hep C screen neg   Mammogram 8/17 -abn , had axillary LN bx-dx with lymphoma  Next mammogram in a year  Feels fine =seeing oncology , no current active treatment  Self breast exam-no lumps or changes   Colonoscopy 09 with a 10 year recall  ifob 7/12 neg  bp is stable today  No cp or palpitations or headaches or edema  No side effects to medicines  BP Readings from Last 3 Encounters:  07/04/16 (!) 146/78  06/27/16 124/82  02/28/16 (!) 174/86    She checks bp at home 2-3 times per week / usually very good  Up a bit with recent cold (? Due to cold med otc)  Re check bp improved today BP: 130/70    On amlodipine and lisinopril   Hx of hyperlipidemia Lab Results  Component Value Date   CHOL 307 (H) 06/27/2016   CHOL 282 (H) 02/23/2014   CHOL 285 (H) 09/17/2013   Lab Results  Component Value Date   HDL 48.80 06/27/2016   HDL 46.50 02/23/2014   HDL 46.90 09/17/2013   Lab Results  Component Value Date   LDLCALC 235 (H) 06/27/2016   LDLCALC 199 (H) 09/17/2013   LDLCALC 115 (H) 07/07/2012   Lab Results  Component Value Date   TRIG 116.0 06/27/2016   TRIG 204.0 (H) 02/23/2014   TRIG 196.0 (H)  09/17/2013   Lab Results  Component Value Date   CHOLHDL 6 06/27/2016   CHOLHDL 6 02/23/2014   CHOLHDL 6 09/17/2013   Lab Results  Component Value Date   LDLDIRECT 204.9 02/23/2014   LDLDIRECT 249.3 04/23/2012   LDLDIRECT 217.7 12/11/2010   Cannot tolerate statins at all  [employer] This is hereditary - son has it also (he takes it too)  Is interested in PCYK9 inhibitor if she would be a candidate   Results for orders placed or performed in visit on 06/27/16  CBC with Differential/Platelet  Result Value Ref Range   WBC 9.4 4.0 - 10.5 K/uL   RBC 5.08 3.87 - 5.11 Mil/uL   Hemoglobin 13.4 12.0 - 15.0 g/dL   HCT 41.1 36.0 - 46.0 %   MCV 81.0 78.0 - 100.0 fl   MCHC 32.7 30.0 - 36.0 g/dL   RDW 13.9 11.5 - 15.5 %   Platelets 325.0 150.0 - 400.0 K/uL   Neutrophils Relative % 37.4 (L) 43.0 - 77.0 %   Lymphocytes Relative 51.9 (H) 12.0 - 46.0 %   Monocytes Relative 3.7  3.0 - 12.0 %   Eosinophils Relative 6.6 (H) 0.0 - 5.0 %   Basophils Relative 0.4 0.0 - 3.0 %   Neutro Abs 3.5 1.4 - 7.7 K/uL   Lymphs Abs 4.9 (H) 0.7 - 4.0 K/uL   Monocytes Absolute 0.3 0.1 - 1.0 K/uL   Eosinophils Absolute 0.6 0.0 - 0.7 K/uL   Basophils Absolute 0.0 0.0 - 0.1 K/uL  Comprehensive metabolic panel  Result Value Ref Range   Sodium 141 135 - 145 mEq/L   Potassium 4.1 3.5 - 5.1 mEq/L   Chloride 105 96 - 112 mEq/L   CO2 26 19 - 32 mEq/L   Glucose, Bld 100 (H) 70 - 99 mg/dL   BUN 19 6 - 23 mg/dL   Creatinine, Ser 0.94 0.40 - 1.20 mg/dL   Total Bilirubin 0.4 0.2 - 1.2 mg/dL   Alkaline Phosphatase 63 39 - 117 U/L   AST 16 0 - 37 U/L   ALT 12 0 - 35 U/L   Total Protein 8.1 6.0 - 8.3 g/dL   Albumin 4.3 3.5 - 5.2 g/dL   Calcium 10.0 8.4 - 10.5 mg/dL   GFR 75.77 >60.00 mL/min  Lipid panel  Result Value Ref Range   Cholesterol 307 (H) 0 - 200 mg/dL   Triglycerides 116.0 0.0 - 149.0 mg/dL   HDL 48.80 >39.00 mg/dL   VLDL 23.2 0.0 - 40.0 mg/dL   LDL Cholesterol 235 (H) 0 - 99 mg/dL   Total  CHOL/HDL Ratio 6    NonHDL 258.46   TSH  Result Value Ref Range   TSH 1.11 0.35 - 4.50 uIU/mL    Patient Active Problem List   Diagnosis Date Noted  . Need for hepatitis C screening test 06/26/2016  . 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 Dislocation of left shoulder joint 03/01/2015  . Infection of skin of finger 08/25/2014  . Obesity (BMI 30-39.9) 10/01/2013  . Encounter for Medicare annual wellness exam 09/30/2013  . Estrogen deficiency 09/17/2012  . Special screening for malignant neoplasms, colon 12/15/2010  . Other screening mammogram 12/15/2010  . Routine general medical examination at a health care facility 12/08/2010  . BACK PAIN WITH RADICULOPATHY 09/06/2010  . OSTEOARTHRITIS, KNEE, RIGHT 10/11/2008  . Internal hemorrhoids 09/02/2008  . Hypertension 04/28/2008  . PNEUMONIA, HX OF 03/03/2008  . Hyperlipidemia 02/26/2008   Past Medical History:  Diagnosis Date  . Arthritis    osteo arthritis of right knee  . Pneumonia    Past Surgical History:  Procedure Laterality Date  . ABDOMINAL HYSTERECTOMY    . AXILLARY LYMPH NODE BIOPSY Right 02/07/2016  . BREAST SURGERY     Social History  Substance Use Topics  . Smoking status: Never Smoker  . Smokeless tobacco: Never Used  . Alcohol use No   Family History  Problem Relation Age of Onset  . Breast cancer Sister    Allergies  Allergen Reactions  . Atorvastatin Other (See Comments)    Ache in back and legs  . Hctz [Hydrochlorothiazide] Nausea And Vomiting    Nausea and vomiting    Current Outpatient Prescriptions on File Prior to Visit  Medication Sig Dispense Refill  . Cholecalciferol (VITAMIN D3 PO) Take 2 capsules by mouth daily.     No current facility-administered medications on file prior to visit.      Review of Systems Review of Systems  Constitutional: Negative for fever, appetite change, fatigue and unexpected weight change.  Eyes: Negative for pain and visual disturbance.  ENT pos  for nasal cong and pnd that is improving  Respiratory: Negative for wheeze and shortness of breath.   Cardiovascular: Negative for cp or palpitations    Gastrointestinal: Negative for nausea, diarrhea and constipation.  Genitourinary: Negative for urgency and frequency.  Skin: Negative for pallor or rash   Neurological: Negative for weakness, light-headedness, numbness and headaches.  Hematological: Negative for adenopathy. Does not bruise/bleed easily.  Psychiatric/Behavioral: Negative for dysphoric mood. The patient is not nervous/anxious.         Objective:   Physical Exam  Constitutional: She appears well-developed and well-nourished. No distress.  obese and well appearing   HENT:  Head: Normocephalic and atraumatic.  Right Ear: External ear normal.  Left Ear: External ear normal.  Mouth/Throat: Oropharynx is clear and moist.  Eyes: Conjunctivae and EOM are normal. Pupils are equal, round, and reactive to light. No scleral icterus.  Neck: Normal range of motion. Neck supple. No JVD present. Carotid bruit is not present. No thyromegaly present.  Cardiovascular: Normal rate, regular rhythm, normal heart sounds and intact distal pulses.  Exam reveals no gallop.   Pulmonary/Chest: Effort normal and breath sounds normal. No respiratory distress. She has no wheezes. She exhibits no tenderness.  Abdominal: Soft. Bowel sounds are normal. She exhibits no distension, no abdominal bruit and no mass. There is no tenderness.  Genitourinary: No breast swelling, tenderness, discharge or bleeding.  Genitourinary Comments: Breast exam: No mass, nodules, thickening, tenderness, bulging, retraction, inflamation, nipple discharge or skin changes noted.  No axillary or clavicular LA.      Musculoskeletal: Normal range of motion. She exhibits no edema or tenderness.  Lymphadenopathy:    She has no cervical adenopathy.  Neurological: She is alert. She has normal reflexes. No cranial nerve deficit. She  exhibits normal muscle tone. Coordination normal.  Skin: Skin is warm and dry. No rash noted. No erythema. No pallor.  Some skin tags and few lentigines   Psychiatric: She has a normal mood and affect.          Assessment & Plan:   Problem List Items Addressed This Visit      Cardiovascular and Mediastinum   Hypertension - Primary    bp in fair control at this time  BP Readings from Last 1 Encounters:  07/04/16 130/70   No changes needed Disc lifstyle change with low sodium diet and exercise  Labs reviewed       Relevant Medications   amLODipine (NORVASC) 10 MG tablet   lisinopril (PRINIVIL,ZESTRIL) 10 MG tablet     Other   Routine general medical examination at a health care facility    Reviewed health habits including diet and exercise and skin cancer prevention Reviewed appropriate screening tests for age  Also reviewed health mt list, fam hx and immunization status , as well as social and family history   See HPI Reviewed AMW Flu and prevnar vaccines today  Declines dexa at this time  Will check on coverage of zoster vaccine  Hep C screen neg  Mammogram due 8/17 Under care of oncology for lyphoma  utd colonoscopy  Planning ref to cardiology to disc tx opt for severe hyperlipidemia with intol of statins  Labs rev         Obesity (BMI 30-39.9)    Discussed how this problem influences overall health and the risks it imposes  Reviewed plan for weight loss with lower calorie diet (via better food choices and also portion control or program like  weight watchers) and exercise building up to or more than 30 minutes 5 days per week including some aerobic activity   Enc to keep up the good work      Need for hepatitis C screening test    Neg screening test       Lymphoma, small lymphocytic ([place])    Under care of oncology  Dx after ln bx in summer       Hyperlipidemia    Disc goals for lipids and reasons to control them Rev labs with pt Rev low sat fat  diet in detail LDL is up to 235 and pt cannot tolerate any statins  She may be a candidate for PCYK9 inhibitor (? Trial / or with $ assistance) Will refer to cardiology to discuss this  Enc to keep eating a low sat fat diet       Relevant Medications   amLODipine (NORVASC) 10 MG tablet   lisinopril (PRINIVIL,ZESTRIL) 10 MG tablet   Other Relevant Orders   Ambulatory referral to Cardiology    Other Visit Diagnoses    Need for vaccination with 13-polyvalent pneumococcal conjugate vaccine       Relevant Medications   pneumococcal 13-valent conjugate vaccine (PREVNAR 13) injection 0.5 mL (Completed)   Need for influenza vaccination       Relevant Orders   Flu Vaccine QUAD 36+ mos IM (Completed)

## 2016-07-04 NOTE — Progress Notes (Signed)
Pre visit review using our clinic review tool, if applicable. No additional management support is needed unless otherwise documented below in the visit note. 

## 2016-07-05 NOTE — Assessment & Plan Note (Signed)
Disc goals for lipids and reasons to control them Rev labs with pt Rev low sat fat diet in detail LDL is up to 235 and pt cannot tolerate any statins  She may be a candidate for PCYK9 inhibitor (? Trial / or with $ assistance) Will refer to cardiology to discuss this  Enc to keep eating a low sat fat diet

## 2016-07-05 NOTE — Assessment & Plan Note (Signed)
Reviewed health habits including diet and exercise and skin cancer prevention Reviewed appropriate screening tests for age  Also reviewed health mt list, fam hx and immunization status , as well as social and family history   See HPI Reviewed AMW Flu and prevnar vaccines today  Declines dexa at this time  Will check on coverage of zoster vaccine  Hep C screen neg  Mammogram due 8/17 Under care of oncology for lyphoma  utd colonoscopy  Planning ref to cardiology to disc tx opt for severe hyperlipidemia with intol of statins  Labs rev

## 2016-07-05 NOTE — Assessment & Plan Note (Signed)
bp in fair control at this time  BP Readings from Last 1 Encounters:  07/04/16 130/70   No changes needed Disc lifstyle change with low sodium diet and exercise  Labs reviewed

## 2016-07-05 NOTE — Assessment & Plan Note (Signed)
Discussed how this problem influences overall health and the risks it imposes  Reviewed plan for weight loss with lower calorie diet (via better food choices and also portion control or program like weight watchers) and exercise building up to or more than 30 minutes 5 days per week including some aerobic activity   Enc to keep up the good work

## 2016-07-05 NOTE — Assessment & Plan Note (Signed)
Under care of oncology  Dx after ln bx in summer

## 2016-07-05 NOTE — Assessment & Plan Note (Signed)
Neg screening test  

## 2016-07-20 ENCOUNTER — Ambulatory Visit: Payer: Medicare Other | Admitting: Internal Medicine

## 2016-08-30 ENCOUNTER — Ambulatory Visit (HOSPITAL_BASED_OUTPATIENT_CLINIC_OR_DEPARTMENT_OTHER): Payer: Medicare Other | Admitting: Hematology and Oncology

## 2016-08-30 ENCOUNTER — Other Ambulatory Visit (HOSPITAL_BASED_OUTPATIENT_CLINIC_OR_DEPARTMENT_OTHER): Payer: Medicare Other

## 2016-08-30 ENCOUNTER — Encounter: Payer: Self-pay | Admitting: Hematology and Oncology

## 2016-08-30 DIAGNOSIS — C8308 Small cell B-cell lymphoma, lymph nodes of multiple sites: Secondary | ICD-10-CM | POA: Diagnosis not present

## 2016-08-30 DIAGNOSIS — C8300 Small cell B-cell lymphoma, unspecified site: Secondary | ICD-10-CM

## 2016-08-30 LAB — CBC WITH DIFFERENTIAL/PLATELET
BASO%: 0.2 % (ref 0.0–2.0)
Basophils Absolute: 0 10*3/uL (ref 0.0–0.1)
EOS%: 3.9 % (ref 0.0–7.0)
Eosinophils Absolute: 0.4 10*3/uL (ref 0.0–0.5)
HCT: 41.5 % (ref 34.8–46.6)
HEMOGLOBIN: 13.4 g/dL (ref 11.6–15.9)
LYMPH#: 5.2 10*3/uL — AB (ref 0.9–3.3)
LYMPH%: 58.2 % — ABNORMAL HIGH (ref 14.0–49.7)
MCH: 26.7 pg (ref 25.1–34.0)
MCHC: 32.3 g/dL (ref 31.5–36.0)
MCV: 82.7 fL (ref 79.5–101.0)
MONO#: 1.1 10*3/uL — ABNORMAL HIGH (ref 0.1–0.9)
MONO%: 12.3 % (ref 0.0–14.0)
NEUT%: 25.4 % — ABNORMAL LOW (ref 38.4–76.8)
NEUTROS ABS: 2.3 10*3/uL (ref 1.5–6.5)
NRBC: 0 % (ref 0–0)
Platelets: 287 10*3/uL (ref 145–400)
RBC: 5.02 10*6/uL (ref 3.70–5.45)
RDW: 14 % (ref 11.2–14.5)
WBC: 9 10*3/uL (ref 3.9–10.3)

## 2016-08-30 LAB — COMPREHENSIVE METABOLIC PANEL
ALBUMIN: 4 g/dL (ref 3.5–5.0)
ALK PHOS: 69 U/L (ref 40–150)
ALT: 14 U/L (ref 0–55)
AST: 15 U/L (ref 5–34)
Anion Gap: 8 mEq/L (ref 3–11)
BUN: 21.8 mg/dL (ref 7.0–26.0)
CO2: 26 mEq/L (ref 22–29)
Calcium: 10 mg/dL (ref 8.4–10.4)
Chloride: 106 mEq/L (ref 98–109)
Creatinine: 1 mg/dL (ref 0.6–1.1)
EGFR: 65 mL/min/{1.73_m2} — ABNORMAL LOW (ref >90)
GLUCOSE: 92 mg/dL (ref 70–140)
POTASSIUM: 4.5 meq/L (ref 3.5–5.1)
SODIUM: 140 meq/L (ref 136–145)
TOTAL PROTEIN: 8 g/dL (ref 6.4–8.3)
Total Bilirubin: 0.33 mg/dL (ref 0.20–1.20)

## 2016-08-30 LAB — LACTATE DEHYDROGENASE: LDH: 286 U/L — AB (ref 125–245)

## 2016-08-30 LAB — TECHNOLOGIST REVIEW

## 2016-08-30 NOTE — Progress Notes (Signed)
Patient Care Team: Abner Greenspan, MD as PCP - General (Family Medicine) Nicholas Lose, MD as Consulting Physician (Hematology and Oncology) Galvin Proffer, OD as Consulting Physician (Optometry)  DIAGNOSIS:  Encounter Diagnosis  Name Primary?  . 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 She is here for routine follow-up. She denies any fevers chills night sweats. She is trying to lose weight and has lost a few pounds. Denies any painful lymphadenopathy.  REVIEW OF SYSTEMS:   Constitutional: Denies fevers, chills or abnormal weight loss Eyes: Denies blurriness of vision Ears, nose, mouth, throat, and face: Denies mucositis or sore throat Respiratory: Denies cough, dyspnea or wheezes Cardiovascular: Denies palpitation, chest discomfort Gastrointestinal:  Denies nausea, heartburn or change in bowel habits Skin: Denies abnormal skin rashes Lymphatics: Denies new lymphadenopathy or easy bruising Neurological:Denies numbness, tingling or new weaknesses Behavioral/Psych: Mood is stable, no new changes  Extremities: No lower extremity edema  All other systems were reviewed with the patient and are negative.  I have reviewed the past medical history, past surgical history, social history and family history with the patient and they are unchanged from previous note.  ALLERGIES:  is allergic to atorvastatin and hctz [hydrochlorothiazide].  MEDICATIONS:  Current Outpatient Prescriptions  Medication Sig Dispense Refill  . amLODipine (NORVASC) 10 MG tablet Take 1 tablet (10 mg total)  by mouth daily. 30 tablet 11  . Cholecalciferol (VITAMIN D3 PO) Take 2 capsules by mouth daily.    Marland Kitchen lisinopril (PRINIVIL,ZESTRIL) 10 MG tablet Take 0.5 tablets (5 mg total) by mouth daily. 30 tablet 11   No current facility-administered medications for this visit.     PHYSICAL EXAMINATION: ECOG PERFORMANCE STATUS: 0 - Asymptomatic  Vitals:   08/30/16 1114  BP: (!) 160/79  Pulse: 78  Resp: 18  Temp: 97.9 F (36.6 C)   Filed Weights   08/30/16 1114  Weight: 170 lb 1.6 oz (77.2 kg)    GENERAL:alert, no distress and comfortable SKIN: skin color, texture, turgor are normal, no rashes or significant lesions EYES: normal, Conjunctiva are pink and non-injected, sclera clear OROPHARYNX:no exudate, no erythema and lips, buccal mucosa, and tongue normal  NECK: Bilateral cervical lymphadenopathy LYMPH:  Bilateral cervical lymphadenopathy LUNGS: clear to auscultation and percussion with normal breathing effort HEART: regular rate & rhythm and no murmurs and no lower extremity edema ABDOMEN:abdomen soft, non-tender and normal bowel sounds MUSCULOSKELETAL:no cyanosis of digits and no clubbing  NEURO: alert & oriented x 3 with fluent speech, no focal motor/sensory deficits EXTREMITIES: No lower extremity edema  LABORATORY DATA:  I have reviewed the data as listed   Chemistry      Component Value Date/Time   NA 141 06/27/2016 1143   NA 140 02/29/2016 1344   K 4.1 06/27/2016 1143   K 3.9 02/29/2016 1344   CL 105 06/27/2016 1143   CO2 26 06/27/2016 1143   CO2 25 02/29/2016 1344   BUN 19 06/27/2016 1143   BUN 24.7 02/29/2016 1344   CREATININE 0.94 06/27/2016 1143   CREATININE 1.1 02/29/2016 1344      Component Value Date/Time  CALCIUM 10.0 06/27/2016 1143   CALCIUM 9.5 02/29/2016 1344   ALKPHOS 63 06/27/2016 1143   ALKPHOS 69 02/29/2016 1344   AST 16 06/27/2016 1143   AST 28 02/29/2016 1344   ALT 12 06/27/2016 1143   ALT 20 02/29/2016 1344   BILITOT 0.4 06/27/2016 1143    BILITOT 0.30 02/29/2016 1344       Lab Results  Component Value Date   WBC 9.0 08/30/2016   HGB 13.4 08/30/2016   HCT 41.5 08/30/2016   MCV 82.7 08/30/2016   PLT 287 08/30/2016   NEUTROABS 2.3 08/30/2016    ASSESSMENT & PLAN:  Lymphoma, small lymphocytic (HCC) Right axillary lymph node biopsy: 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 Because of patient has axillary and cervical lymph nodes, she has stage II A disease.  Treatment: SLL is generally managed based upon symptoms and extent of involvement. Since the patient is asymptomatic, I would recommend watchful waiting and monitoring. Lab review:  LDH 268 on 02/29/2016 SPEP: M spike not observed CBC differential: White count 6.9, ANC 2.1, platelets 274, hemoglobin 12.5, absolute lymphocyte count 3.7  Labs today revealed normal CBC and normal CMP. Return to clinic in 6 months with lab check and follow-up on symptoms    I spent 25 minutes talking to the patient of which more than half was spent in counseling and coordination of care.  No orders of the defined types were placed in this encounter.  The patient has a good understanding of the overall plan. she agrees with it. she will call with any problems that may develop before the next visit here.   Rulon Eisenmenger, MD 08/30/16

## 2016-08-30 NOTE — Assessment & Plan Note (Signed)
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 Because of patient has axillary and cervical lymph nodes, she has stage II A disease.  Treatment: SLL is generally managed based upon symptoms and extent of involvement. Since the patient is asymptomatic, I would recommend watchful waiting and monitoring. Lab review:  LDH 268 on 02/29/2016 SPEP: M spike not observed CBC differential: White count 6.9, ANC 2.1, platelets 274, hemoglobin 12.5, absolute lymphocyte count 3.7  Return to clinic in 6 months with lab check and follow-up on symptoms

## 2016-12-21 ENCOUNTER — Telehealth: Payer: Self-pay | Admitting: Family Medicine

## 2016-12-21 NOTE — Telephone Encounter (Signed)
Aware-will watch for notes  

## 2016-12-21 NOTE — Telephone Encounter (Signed)
Patient Name: Erica Carlson DOB: 1946-05-27 Initial Comment Caller states she has been coughing, and she is wheezing and having a hard time breathing. After she started to take musinex she started to feel dizzy, nauseous, unable to eat and when she does cough its just clear. She started to take it on Monday Nurse Assessment Nurse: Ronnald Ramp, RN, [place] Date/Time [name] Time): 12/21/2016 8:38:51 AM Confirm and document reason for call. If symptomatic, describe symptoms. ---Caller states she has had a cough since Monday. She is having wheezing (described as congestion at times and some times there is a whistle) trouble breathing. Not wheezing now. She started having dizzy and nausea on Tuesday. Also with decreased appetite. No vomiting. Does the patient have any new or worsening symptoms? ---Yes Will a triage be completed? ---Yes Related visit to physician within the last 2 weeks? ---No Does the PT have any chronic conditions? (i.e. diabetes, asthma, etc.) ---Yes List chronic conditions. ---HTN, Is this a behavioral health or substance abuse call? ---No Guidelines Guideline Title Affirmed Question Affirmed Notes Cough - Acute Productive Difficulty breathing Final Disposition User Go to ED Now Ronnald Ramp, RN, [facility] - ED Disagree/Comply: Comply

## 2017-02-27 ENCOUNTER — Encounter: Payer: Self-pay | Admitting: Hematology and Oncology

## 2017-02-27 ENCOUNTER — Other Ambulatory Visit (HOSPITAL_BASED_OUTPATIENT_CLINIC_OR_DEPARTMENT_OTHER): Payer: Medicare Other

## 2017-02-27 ENCOUNTER — Ambulatory Visit (HOSPITAL_BASED_OUTPATIENT_CLINIC_OR_DEPARTMENT_OTHER): Payer: Medicare Other | Admitting: Hematology and Oncology

## 2017-02-27 DIAGNOSIS — C8308 Small cell B-cell lymphoma, lymph nodes of multiple sites: Secondary | ICD-10-CM | POA: Diagnosis not present

## 2017-02-27 DIAGNOSIS — C8300 Small cell B-cell lymphoma, unspecified site: Secondary | ICD-10-CM

## 2017-02-27 LAB — CBC WITH DIFFERENTIAL/PLATELET
BASO%: 0.6 % (ref 0.0–2.0)
Basophils Absolute: 0.1 10*3/uL (ref 0.0–0.1)
EOS%: 3.1 % (ref 0.0–7.0)
Eosinophils Absolute: 0.3 10*3/uL (ref 0.0–0.5)
HCT: 41.3 % (ref 34.8–46.6)
HEMOGLOBIN: 13.4 g/dL (ref 11.6–15.9)
LYMPH#: 7.4 10*3/uL — AB (ref 0.9–3.3)
LYMPH%: 74.2 % — ABNORMAL HIGH (ref 14.0–49.7)
MCH: 26.9 pg (ref 25.1–34.0)
MCHC: 32.3 g/dL (ref 31.5–36.0)
MCV: 83 fL (ref 79.5–101.0)
MONO#: 0.2 10*3/uL (ref 0.1–0.9)
MONO%: 1.7 % (ref 0.0–14.0)
NEUT#: 2 10*3/uL (ref 1.5–6.5)
NEUT%: 20.4 % — ABNORMAL LOW (ref 38.4–76.8)
Platelets: 273 10*3/uL (ref 145–400)
RBC: 4.98 10*6/uL (ref 3.70–5.45)
RDW: 14.1 % (ref 11.2–14.5)
WBC: 10 10*3/uL (ref 3.9–10.3)

## 2017-02-27 LAB — COMPREHENSIVE METABOLIC PANEL
ALBUMIN: 4 g/dL (ref 3.5–5.0)
ALK PHOS: 71 U/L (ref 40–150)
ALT: 14 U/L (ref 0–55)
AST: 15 U/L (ref 5–34)
Anion Gap: 6 mEq/L (ref 3–11)
BUN: 24 mg/dL (ref 7.0–26.0)
CO2: 28 meq/L (ref 22–29)
Calcium: 10.1 mg/dL (ref 8.4–10.4)
Chloride: 106 mEq/L (ref 98–109)
Creatinine: 1.1 mg/dL (ref 0.6–1.1)
EGFR: 56 mL/min/{1.73_m2} — ABNORMAL LOW (ref >90)
GLUCOSE: 94 mg/dL (ref 70–140)
POTASSIUM: 4.4 meq/L (ref 3.5–5.1)
SODIUM: 139 meq/L (ref 136–145)
TOTAL PROTEIN: 7.8 g/dL (ref 6.4–8.3)
Total Bilirubin: 0.43 mg/dL (ref 0.20–1.20)

## 2017-02-27 LAB — LACTATE DEHYDROGENASE: LDH: 290 U/L — ABNORMAL HIGH (ref 125–245)

## 2017-02-27 LAB — TECHNOLOGIST REVIEW

## 2017-02-27 NOTE — Assessment & Plan Note (Signed)
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  Treatment:Since the patient is asymptomatic, I recommend watchful waiting and monitoring.  Lab review:  LDH 268 on 02/29/2016 SPEP: M spike not observed CBC differential: White count 6.9, ANC 2.1, platelets 274, hemoglobin 12.5, absolute lymphocyte count 3.7  Labs today revealed normal CBC and normal CMP. Return to clinic in 6 months with lab check and follow-up on symptoms

## 2017-02-27 NOTE — Progress Notes (Signed)
Patient Care Team: Tower, Wynelle Fanny, MD as PCP - General (Family Medicine) Nicholas Lose, MD as Consulting Physician (Hematology and Oncology) Galvin Proffer, OD as Consulting Physician (Optometry)  DIAGNOSIS:  Encounter Diagnosis  Name Primary?  . 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 She reports no B symptoms. She denies any fatigue, fevers, night sweats, weight loss although she lost some weight by exercising regularly. Denies any painful lymphadenopathy. She does not feel any new lymph nodes anywhere else in her body.  REVIEW OF SYSTEMS:   Constitutional: Denies fevers, chills or abnormal weight loss Eyes: Denies blurriness of vision Ears, nose, mouth, throat, and face: Denies mucositis or sore throat Respiratory: Denies cough, dyspnea or wheezes Cardiovascular: Denies palpitation, chest discomfort Gastrointestinal:  Denies nausea, heartburn or change in bowel habits Skin: Denies abnormal skin rashes Lymphatics: Bilateral cervical lymphadenopathy Neurological:Denies numbness, tingling or new weaknesses Behavioral/Psych: Mood is stable, no new changes  Extremities: No lower extremity edema All other systems were reviewed with the patient and are negative.  I have reviewed the past medical history, past surgical history, social history and family history with the patient and they are unchanged from previous note.  ALLERGIES:  is allergic to atorvastatin and hctz [hydrochlorothiazide].  MEDICATIONS:  Current  Outpatient Prescriptions  Medication Sig Dispense Refill  . amLODipine (NORVASC) 10 MG tablet Take 1 tablet (10 mg total) by mouth daily. 30 tablet 11  . Cholecalciferol (VITAMIN D3 PO) Take 2 capsules by mouth daily.    Marland Kitchen lisinopril (PRINIVIL,ZESTRIL) 10 MG tablet Take 0.5 tablets (5 mg total) by mouth daily. 30 tablet 11   No current facility-administered medications for this visit.     PHYSICAL EXAMINATION: ECOG PERFORMANCE STATUS: 1 - Symptomatic but completely ambulatory  Vitals:   02/27/17 1103  BP: (!) 131/91  Pulse: (!) 105  Resp: 18  Temp: 98.1 F (36.7 C)  SpO2: 93%   Filed Weights   02/27/17 1103  Weight: 152 lb 14.4 oz (69.4 kg)    GENERAL:alert, no distress and comfortable SKIN: skin color, texture, turgor are normal, no rashes or significant lesions EYES: normal, Conjunctiva are pink and non-injected, sclera clear OROPHARYNX:no exudate, no erythema and lips, buccal mucosa, and tongue normal  NECK: Bilateral cervical lymphadenopathy LYMPH:  no palpable lymphadenopathy in the cervical, axillary or inguinal LUNGS: clear to auscultation and percussion with normal breathing effort HEART: regular rate & rhythm and no murmurs and no lower extremity edema ABDOMEN:abdomen soft, non-tender and normal bowel sounds MUSCULOSKELETAL:no cyanosis of digits and no clubbing  NEURO: alert & oriented x 3 with fluent speech, no focal motor/sensory deficits EXTREMITIES: No lower extremity edema  LABORATORY DATA:  I have reviewed the data as listed   Chemistry      Component Value Date/Time   NA 139 02/27/2017 1002   K 4.4 02/27/2017 1002   CL 105 06/27/2016 1143   CO2 28 02/27/2017 1002   BUN 24.0 02/27/2017 1002   CREATININE 1.1 02/27/2017 1002      Component Value Date/Time   CALCIUM 10.1 02/27/2017 1002  ALKPHOS 71 02/27/2017 1002   AST 15 02/27/2017 1002   ALT 14 02/27/2017 1002   BILITOT 0.43 02/27/2017 1002       Lab Results  Component Value Date   WBC  10.0 02/27/2017   HGB 13.4 02/27/2017   HCT 41.3 02/27/2017   MCV 83.0 02/27/2017   PLT 273 02/27/2017   NEUTROABS 2.0 02/27/2017    ASSESSMENT & PLAN:  Lymphoma, small lymphocytic (HCC) Right axillary lymph node biopsy: 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  Treatment:Since the patient is asymptomatic, I recommend watchful waiting and monitoring.  Lab review:  LDH 268 on 02/29/2016 SPEP: M spike not observed CBC differential: White count 10, ANC 2, platelets 273, hemoglobin 13.4, absolute lymphocyte count 7.4 I discussed with her Labs today revealed normal CBC and normal CMP. Return to clinic in 6 months with lab check and follow-up on symptoms   I spent 15 minutes talking to the patient of which more than half was spent in counseling and coordination of care.  Orders Placed This Encounter  Procedures  . CBC with Differential    Standing Status:   Future    Standing Expiration Date:   02/27/2018  . Comprehensive metabolic panel    Standing Status:   Future    Standing Expiration Date:   02/27/2018  . Lactate dehydrogenase (LDH)    Standing Status:   Future    Standing Expiration Date:   02/27/2018   The patient has a good understanding of the overall plan. she agrees with it. she will call with any problems that may develop before the next visit here.   Rulon Eisenmenger, MD 02/27/17

## 2017-06-24 ENCOUNTER — Encounter: Payer: Self-pay | Admitting: Family Medicine

## 2017-06-30 ENCOUNTER — Telehealth: Payer: Self-pay | Admitting: Family Medicine

## 2017-06-30 DIAGNOSIS — E7800 Pure hypercholesterolemia, unspecified: Secondary | ICD-10-CM

## 2017-06-30 DIAGNOSIS — I10 Essential (primary) hypertension: Secondary | ICD-10-CM

## 2017-06-30 NOTE — Telephone Encounter (Signed)
-----   Message from Ellamae Sia sent at 06/26/2017  8:39 AM EST ----- Regarding: Lab orders for Monday,12.24.18 Patient is scheduled for CPX labs, please order future labs, Thanks , Karna Christmas

## 2017-07-01 ENCOUNTER — Other Ambulatory Visit (INDEPENDENT_AMBULATORY_CARE_PROVIDER_SITE_OTHER): Payer: Medicare Other

## 2017-07-01 DIAGNOSIS — E7800 Pure hypercholesterolemia, unspecified: Secondary | ICD-10-CM | POA: Diagnosis not present

## 2017-07-01 DIAGNOSIS — I10 Essential (primary) hypertension: Secondary | ICD-10-CM

## 2017-07-01 LAB — CBC WITH DIFFERENTIAL/PLATELET
Basophils Absolute: 0 10*3/uL (ref 0.0–0.1)
Basophils Relative: 0.2 % (ref 0.0–3.0)
EOS PCT: 3.2 % (ref 0.0–5.0)
Eosinophils Absolute: 0.4 10*3/uL (ref 0.0–0.7)
HCT: 38.5 % (ref 36.0–46.0)
Hemoglobin: 12 g/dL (ref 12.0–15.0)
LYMPHS ABS: 9.9 10*3/uL — AB (ref 0.7–4.0)
Lymphocytes Relative: 74.6 % — ABNORMAL HIGH (ref 12.0–46.0)
MCHC: 31.2 g/dL (ref 30.0–36.0)
MCV: 84.9 fl (ref 78.0–100.0)
MONO ABS: 0.7 10*3/uL (ref 0.1–1.0)
MONOS PCT: 4.9 % (ref 3.0–12.0)
NEUTROS ABS: 2.3 10*3/uL (ref 1.4–7.7)
PLATELETS: 270 10*3/uL (ref 150.0–400.0)
RBC: 4.53 Mil/uL (ref 3.87–5.11)
RDW: 14 % (ref 11.5–15.5)
WBC: 13.2 10*3/uL — ABNORMAL HIGH (ref 4.0–10.5)

## 2017-07-01 LAB — COMPREHENSIVE METABOLIC PANEL
ALK PHOS: 63 U/L (ref 39–117)
ALT: 11 U/L (ref 0–35)
AST: 16 U/L (ref 0–37)
Albumin: 4.2 g/dL (ref 3.5–5.2)
BUN: 26 mg/dL — ABNORMAL HIGH (ref 6–23)
CHLORIDE: 109 meq/L (ref 96–112)
CO2: 28 meq/L (ref 19–32)
Calcium: 9.5 mg/dL (ref 8.4–10.5)
Creatinine, Ser: 1.07 mg/dL (ref 0.40–1.20)
GFR: 65.06 mL/min (ref >60.00)
GLUCOSE: 86 mg/dL (ref 70–99)
POTASSIUM: 4.1 meq/L (ref 3.5–5.1)
Sodium: 142 mEq/L (ref 135–145)
Total Bilirubin: 0.3 mg/dL (ref 0.2–1.2)
Total Protein: 7.2 g/dL (ref 6.0–8.3)

## 2017-07-01 LAB — LIPID PANEL
CHOL/HDL RATIO: 6
Cholesterol: 282 mg/dL — ABNORMAL HIGH (ref 0–200)
HDL: 49.7 mg/dL (ref >39.00)
LDL Cholesterol: 204 mg/dL — ABNORMAL HIGH (ref 0–99)
NonHDL: 232.3
TRIGLYCERIDES: 141 mg/dL (ref 0.0–149.0)
VLDL: 28.2 mg/dL (ref 0.0–40.0)

## 2017-07-01 LAB — TSH: TSH: 1.76 u[IU]/mL (ref 0.35–4.50)

## 2017-07-08 ENCOUNTER — Encounter: Payer: Self-pay | Admitting: Family Medicine

## 2017-07-08 ENCOUNTER — Ambulatory Visit (INDEPENDENT_AMBULATORY_CARE_PROVIDER_SITE_OTHER): Payer: Medicare Other | Admitting: Family Medicine

## 2017-07-08 VITALS — BP 172/94 | HR 77 | Temp 98.4°F | Ht 61.25 in | Wt 170.2 lb

## 2017-07-08 DIAGNOSIS — Z23 Encounter for immunization: Secondary | ICD-10-CM

## 2017-07-08 DIAGNOSIS — E669 Obesity, unspecified: Secondary | ICD-10-CM | POA: Diagnosis not present

## 2017-07-08 DIAGNOSIS — I10 Essential (primary) hypertension: Secondary | ICD-10-CM | POA: Diagnosis not present

## 2017-07-08 DIAGNOSIS — C8300 Small cell B-cell lymphoma, unspecified site: Secondary | ICD-10-CM

## 2017-07-08 DIAGNOSIS — Z0000 Encounter for general adult medical examination without abnormal findings: Secondary | ICD-10-CM

## 2017-07-08 DIAGNOSIS — Z1211 Encounter for screening for malignant neoplasm of colon: Secondary | ICD-10-CM | POA: Diagnosis not present

## 2017-07-08 DIAGNOSIS — E7800 Pure hypercholesterolemia, unspecified: Secondary | ICD-10-CM | POA: Diagnosis not present

## 2017-07-08 MED ORDER — AMLODIPINE BESYLATE 10 MG PO TABS: 10.0000 mg | ORAL_TABLET | Freq: Every day | ORAL | 11 refills | Status: DC

## 2017-07-08 MED ORDER — LISINOPRIL 10 MG PO TABS: 5.0000 mg | ORAL_TABLET | Freq: Every day | ORAL | 11 refills | Status: DC

## 2017-07-08 NOTE — Progress Notes (Signed)
Subjective:    Patient ID: Erica Carlson, female    DOB: 1945/12/01, 71 y.o.   MRN: [id number]  HPI Here for health maintenance exam and to review chronic medical problems   Has been feeling fair  Taking care of herself  Still working out (but not for 2 weeks due to holidays)  Ready to get back on track    Wt Readings from Last 3 Encounters:  07/08/17 170 lb 4 oz (77.2 kg)  02/27/17 152 lb 14.4 oz (69.4 kg)  08/30/16 170 lb 1.6 oz (77.2 kg)  thinks all weight gain is from the holidays (did eat differently)  31.91 kg/m   Flu shot - wants to get /also PNA 23   Mammogram 8/17-missed it -did not get a reminder this year  Self breast exam  Has had a hysterectomy Sister had breast cancer    PNA - due for PNA 23   dexa - has not had  Not interested in one No falls or fractures  Takes vit D3 daily   Hx of lymphoma followed by oncology (yearly)  Lab Results  Component Value Date   WBC 13.2 Repeated and verified X2. (H) 07/01/2017   HGB 12.0 07/01/2017   HCT 38.5 07/01/2017   MCV 84.9 07/01/2017   PLT 270.0 07/01/2017    Colonoscopy  9/09 Due in sept for 10 year recall   Tetanus shot utd   bp is up today- she said she was upset at home this am -may have affected it (busy am / tv was broke/ was watching kids) and also eating diff over the holidays  Cannot tolerate whole lisinopril (nausea) so takes 1/2  Has some white coat as well  Last night - was 130s/72 last night   No cp or palpitations or headaches or edema  No side effects to medicines  BP Readings from Last 3 Encounters:  07/08/17 (!) 172/94  02/27/17 (!) 131/91  08/30/16 (!) 160/79     Has not had amw visit   Mood is up and down    Hyperlipidemia Lab Results  Component Value Date   CHOL 282 (H) 07/01/2017   CHOL 307 (H) 06/27/2016   CHOL 282 (H) 02/23/2014   Lab Results  Component Value Date   HDL 49.70 07/01/2017   HDL 48.80 06/27/2016   HDL 46.50 02/23/2014   Lab Results  Component  Value Date   LDLCALC 204 (H) 07/01/2017   LDLCALC 235 (H) 06/27/2016   LDLCALC 199 (H) 09/17/2013   Lab Results  Component Value Date   TRIG 141.0 07/01/2017   TRIG 116.0 06/27/2016   TRIG 204.0 (H) 02/23/2014   Lab Results  Component Value Date   CHOLHDL 6 07/01/2017   CHOLHDL 6 06/27/2016   CHOLHDL 6 02/23/2014   Lab Results  Component Value Date   LDLDIRECT 204.9 02/23/2014   LDLDIRECT 249.3 04/23/2012   LDLDIRECT 217.7 12/11/2010   Intol of statins  She was scheduled to see cardiology and had to cancel Would like to consider pcyk9   Lab Results  Component Value Date   CREATININE 1.07 07/01/2017   BUN 26 (H) 07/01/2017   NA 142 07/01/2017   K 4.1 07/01/2017   CL 109 07/01/2017   CO2 28 07/01/2017   Lab Results  Component Value Date   ALT 11 07/01/2017   AST 16 07/01/2017   ALKPHOS 63 07/01/2017   BILITOT 0.3 07/01/2017   Lab Results  Component Value Date   TSH  1.76 07/01/2017    Patient Active Problem List   Diagnosis Date Noted  . Need for hepatitis C screening test 06/26/2016  . Lymphoma, small lymphocytic ([place]) 02/28/2016  . Dislocation of left shoulder joint 03/01/2015  . Infection of skin of finger 08/25/2014  . Obesity (BMI 30-39.9) 10/01/2013  . Encounter for Medicare annual wellness exam 09/30/2013  . Estrogen deficiency 09/17/2012  . Special screening for malignant neoplasms, colon 12/15/2010  . Other screening mammogram 12/15/2010  . Routine general medical examination at a health care facility 12/08/2010  . BACK PAIN WITH RADICULOPATHY 09/06/2010  . OSTEOARTHRITIS, KNEE, RIGHT 10/11/2008  . Internal hemorrhoids 09/02/2008  . Hypertension 04/28/2008  . PNEUMONIA, HX OF 03/03/2008  . Hyperlipidemia 02/26/2008   Past Medical History:  Diagnosis Date  . Arthritis    osteo arthritis of right knee  . Pneumonia    Past Surgical History:  Procedure Laterality Date  . ABDOMINAL HYSTERECTOMY    . AXILLARY LYMPH NODE BIOPSY Right  02/07/2016  . BREAST SURGERY     Social History   Tobacco Use  . Smoking status: Never Smoker  . Smokeless tobacco: Never Used  Substance Use Topics  . Alcohol use: No    Alcohol/week: 0.0 oz  . Drug use: No   Family History  Problem Relation Age of Onset  . Breast cancer Sister    Allergies  Allergen Reactions  . Atorvastatin Other (See Comments)    Ache in back and legs  . Hctz [Hydrochlorothiazide] Nausea And Vomiting    Nausea and vomiting    Current Outpatient Medications on File Prior to Visit  Medication Sig Dispense Refill  . Cholecalciferol (VITAMIN D3 PO) Take 2 capsules by mouth daily.     No current facility-administered medications on file prior to visit.     Review of Systems  Constitutional: Negative for activity change, appetite change, fatigue, fever and unexpected weight change.  HENT: Negative for congestion, ear pain, rhinorrhea, sinus pressure and sore throat.   Eyes: Negative for pain, redness and visual disturbance.  Respiratory: Negative for cough, shortness of breath and wheezing.   Cardiovascular: Negative for chest pain and palpitations.  Gastrointestinal: Negative for abdominal pain, blood in stool, constipation and diarrhea.  Endocrine: Negative for polydipsia and polyuria.  Genitourinary: Negative for dysuria, frequency and urgency.  Musculoskeletal: Negative for arthralgias, back pain and myalgias.  Skin: Negative for pallor and rash.  Allergic/Immunologic: Negative for environmental allergies.  Neurological: Negative for dizziness, syncope and headaches.  Hematological: Negative for adenopathy. Does not bruise/bleed easily.  Psychiatric/Behavioral: Negative for decreased concentration and dysphoric mood. The patient is not nervous/anxious.        Objective:   Physical Exam  Constitutional: She appears well-developed and well-nourished. No distress.  obese and well appearing   HENT:  Head: Normocephalic and atraumatic.  Right Ear:  External ear normal.  Left Ear: External ear normal.  Mouth/Throat: Oropharynx is clear and moist.  Eyes: Conjunctivae and EOM are normal. Pupils are equal, round, and reactive to light. No scleral icterus.  Neck: Normal range of motion. Neck supple. No JVD present. Carotid bruit is not present. No thyromegaly present.  Cardiovascular: Normal rate, regular rhythm, normal heart sounds and intact distal pulses. Exam reveals no gallop.  Pulmonary/Chest: Effort normal and breath sounds normal. No respiratory distress. She has no wheezes. She exhibits no tenderness.  Abdominal: Soft. Bowel sounds are normal. She exhibits no distension, no abdominal bruit and no mass. There is no tenderness.  Genitourinary: No breast swelling, tenderness, discharge or bleeding.  Genitourinary Comments: Breast exam: No mass, nodules, thickening, tenderness, bulging, retraction, inflamation, nipple discharge or skin changes noted.  No axillary or clavicular LA.      Musculoskeletal: Normal range of motion. She exhibits no edema or tenderness.  Lymphadenopathy:    She has no cervical adenopathy.  Neurological: She is alert. She has normal reflexes. No cranial nerve deficit. She exhibits normal muscle tone. Coordination normal.  Skin: Skin is warm and dry. No rash noted. No erythema. No pallor.  Solar lentigines diffusely   Psychiatric: She has a normal mood and affect.          Assessment & Plan:   Problem List Items Addressed This Visit      Cardiovascular and Mediastinum   Hypertension    bp is up-pt suspects this is white coat syndrome  She will f/u with her cuff and log from home (has been significantly lower)  BP Readings from Last 1 Encounters:  07/08/17 (!) 172/94   No changes needed Disc lifstyle change with low sodium diet and exercise        Relevant Medications   lisinopril (PRINIVIL,ZESTRIL) 10 MG tablet   amLODipine (NORVASC) 10 MG tablet     Other   Hyperlipidemia    Very high LDL  and intol of statins Disc goals for lipids and reasons to control them Rev labs with pt Rev low sat fat diet in detail Ref to cardiology to discuss opt for tx - may benefit from PCYK9 inhibitor tx       Relevant Medications   lisinopril (PRINIVIL,ZESTRIL) 10 MG tablet   amLODipine (NORVASC) 10 MG tablet   Other Relevant Orders   Ambulatory referral to Cardiology   Lymphoma, small lymphocytic ([place])   Obesity (BMI 30-39.9)    Discussed how this problem influences overall health and the risks it imposes  Reviewed plan for weight loss with lower calorie diet (via better food choices and also portion control or program like weight watchers) and exercise building up to or more than 30 minutes 5 days per week including some aerobic activity         Routine general medical examination at a health care facility - Primary    Reviewed health habits including diet and exercise and skin cancer prevention Reviewed appropriate screening tests for age  Also reviewed health mt list, fam hx and immunization status , as well as social and family history   See HPI Labs reviewed  Pt will schedule her own mammogram  PNA 23 vaccine today  Flu vaccine today  Due for her colonoscopy in sept 2019-is aware  Will f/u for elevated bp with her cuff  Ref to cardiology to disc cholesterol treatment       Special screening for malignant neoplasms, colon    Pt aware-due for recall colonoscopy 9/19       Other Visit Diagnoses    Need for 23-polyvalent pneumococcal polysaccharide vaccine       Relevant Orders   Pneumococcal polysaccharide vaccine 23-valent greater than or equal to 2yo subcutaneous/IM (Completed)   Need for influenza vaccination       Relevant Orders   Flu Vaccine QUAD 6+ mos PF IM (Fluarix Quad PF) (Completed)

## 2017-07-08 NOTE — Patient Instructions (Addendum)
Don't forget to schedule your mammogram   Flu shot today  Pneumonia shot today   You are due for a colonoscopy (10 year follow up ) in September of 2019  If you need a referral - call us in august   We will refer you to cardiology to discuss cholesterol   Follow up in 1-2 months for blood pressure visit  Bring a log of bp readings from home and bring your cuff

## 2017-07-09 NOTE — Assessment & Plan Note (Signed)
Very high LDL and intol of statins Disc goals for lipids and reasons to control them Rev labs with pt Rev low sat fat diet in detail Ref to cardiology to discuss opt for tx - may benefit from PCYK9 inhibitor tx

## 2017-07-09 NOTE — Assessment & Plan Note (Signed)
Discussed how this problem influences overall health and the risks it imposes  Reviewed plan for weight loss with lower calorie diet (via better food choices and also portion control or program like weight watchers) and exercise building up to or more than 30 minutes 5 days per week including some aerobic activity    

## 2017-07-09 NOTE — Assessment & Plan Note (Signed)
bp is up-pt suspects this is white coat syndrome  She will f/u with her cuff and log from home (has been significantly lower)  BP Readings from Last 1 Encounters:  07/08/17 (!) 172/94   No changes needed Disc lifstyle change with low sodium diet and exercise

## 2017-07-09 NOTE — Assessment & Plan Note (Signed)
Reviewed health habits including diet and exercise and skin cancer prevention Reviewed appropriate screening tests for age  Also reviewed health mt list, fam hx and immunization status , as well as social and family history   See HPI Labs reviewed  Pt will schedule her own mammogram  PNA 23 vaccine today  Flu vaccine today  Due for her colonoscopy in sept 2019-is aware  Will f/u for elevated bp with her cuff  Ref to cardiology to disc cholesterol treatment

## 2017-07-09 NOTE — Assessment & Plan Note (Signed)
Pt aware-due for recall colonoscopy 9/19

## 2017-07-26 ENCOUNTER — Ambulatory Visit: Payer: Medicare Other | Admitting: Cardiology

## 2017-07-26 ENCOUNTER — Encounter: Payer: Self-pay | Admitting: Cardiology

## 2017-07-26 VITALS — BP 150/76 | HR 82 | Resp 16 | Ht 61.0 in | Wt 170.0 lb

## 2017-07-26 DIAGNOSIS — E7800 Pure hypercholesterolemia, unspecified: Secondary | ICD-10-CM | POA: Diagnosis not present

## 2017-07-26 DIAGNOSIS — I10 Essential (primary) hypertension: Secondary | ICD-10-CM

## 2017-07-26 MED ORDER — ROSUVASTATIN CALCIUM 5 MG PO TABS: 5.0000 mg | ORAL_TABLET | Freq: Every day | ORAL | 3 refills | Status: DC

## 2017-07-26 NOTE — Progress Notes (Signed)
Cardiology Office Note   Date:  07/26/2017   ID:  DLYNN RANES, DOB 07-Jul-1946, MRN [id number]  PCP:  Abner Greenspan, MD  Cardiologist:  New   Dr. Marlou Porch.  Erica Carlson is a 72 y.o. female who is being seen today for the evaluation of hyperlipidemia at the request of Tower, Wynelle Fanny, MD.  Chief Complaint  Patient presents with  . New Patient (Initial Visit)    hypercholesterlemia       History of Present Illness: Erica Carlson is a 72 y.o. female who presents for hypercholesterolemia.  She would like to be evaluated for PCSK9 inhibitors.  Had reaction to atorvastatin with ache in back and legs.  She has never tried any other statins.  No hx of CAD.   Pt with HTN, lymphoma, back pain with radiculopathy.  No history of CAD.    Pts. Last lipids T chol 282, TG 141, HDL 49, LDL 204      She has no chest pain, no SOB, no palpitations.  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  She is active and goes to the gym 3 X week and has a Physiological scientist.    Past Medical History:  Diagnosis Date  . Arthritis    osteo arthritis of right knee  . 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 Pneumonia     Past Surgical History:  Procedure Laterality Date  . ABDOMINAL HYSTERECTOMY    . AXILLARY LYMPH NODE BIOPSY Right 02/07/2016  . BREAST SURGERY       Current Outpatient Medications  Medication Sig Dispense Refill  . amLODipine (NORVASC) 10 MG tablet Take 1 tablet (10 mg total) by mouth daily. 30 tablet 11  . Cholecalciferol (VITAMIN D3 PO) Take 2 capsules by mouth daily.    Marland Kitchen lisinopril (PRINIVIL,ZESTRIL) 10 MG tablet Take 0.5 tablets (5 mg total) by mouth daily. 15 tablet 11  . rosuvastatin (CRESTOR) 5 MG tablet Take 1 tablet (5 mg total) by mouth daily. 90 tablet 3   No current facility-administered medications for this visit.     Allergies:   Atorvastatin and Hctz [hydrochlorothiazide]    Social  History:  The patient  reports that  has never smoked. she has never used smokeless tobacco. She reports that she does not drink alcohol or use drugs.   Family History:  The patient's family history includes Breast cancer in her sister; Hyperlipidemia in her son.    ROS:  General:no colds or fevers, no weight changes Skin:no rashes or ulcers HEENT:no blurred vision, no congestion CV:see HPI PUL:see HPI GI:no diarrhea constipation or melena, no indigestion GU:no hematuria, no dysuria MS:no joint pain, no claudication Neuro:no syncope, no lightheadedness Endo:no diabetes, no thyroid disease  Wt Readings from Last 3 Encounters:  07/26/17 170 lb (77.1 kg)  07/08/17 170 lb 4 oz (77.2 kg)  02/27/17 152 lb 14.4 oz (69.4 kg)     PHYSICAL EXAM: VS:  BP (!) 150/76   Pulse 82   Resp 16   Ht 5\' 1"  (1.549 m)   Wt 170 lb (77.1 kg)   SpO2 98%   BMI 32.12 kg/m  , BMI Body mass index is 32.12 kg/m. General:Pleasant affect, NAD Skin:Warm and dry, brisk capillary refill HEENT:normocephalic, sclera clear, mucus membranes moist Neck:supple, no JVD, no bruits  Heart:S1S2 RRR without murmur, gallup, rub or click Lungs:clear without rales, rhonchi,  or wheezes UXN:ATFT, non tender, + BS, do not palpate liver spleen or masses Ext:no lower ext edema, 2+ pedal pulses, 2+ radial pulses Neuro:alert and oriented X 3, MAE, follows commands, + facial symmetry    EKG:  EKG is ordered today. The ekg ordered today demonstrates SR normal EKG   Recent Labs: 07/01/2017: ALT 11; BUN 26; Creatinine, Ser 1.07; Hemoglobin 12.0; Platelets 270.0; Potassium 4.1; Sodium 142; TSH 1.76    Lipid Panel    Component Value Date/Time   CHOL 282 (H) 07/01/2017 0815   TRIG 141.0 07/01/2017 0815   HDL 49.70 07/01/2017 0815   CHOLHDL 6 07/01/2017 0815   VLDL 28.2 07/01/2017 0815   LDLCALC 204 (H) 07/01/2017 0815   LDLDIRECT 204.9 02/23/2014 0854       Other studies Reviewed: Additional studies/ records that  were reviewed today include: none.   ASSESSMENT AND PLAN:  1.  Hyperlipidemia, is intolerant to atorvastatin.  We discussed how insurance pays for pcsk9 meds.  Will need to try water soluable statin - crestor will begin low dose 5 mg daily.  And have her check hepatic and lipids in 6 weeks.  If she develops pain in the mean time she will call and we will stop and try pravastatin.   If intolerant to these meds the next step would be cardiac CT for Ca+ score for CAD if + then insurance would pay.   Pt understands and is agreeable.  She will follow up in lipid clinic in 7 weeks.   I discussed with Dr. Rayann Heman DOD. Pt's cardiologist will be Dr. Marlou Porch.   2.  HTN with known white coat syndrome.  Has been normal in the past. Per PCP   Current medicines are reviewed with the patient today.  The patient Has no concerns regarding medicines.  The following changes have been made:  See above Labs/ tests ordered today include:see above  Disposition:   FU:  see above  Signed, Cecilie Kicks, NP  07/26/2017 1:31 PM    [facility] [place], [place], [address], [place] Phone: [phone]; Fax: [phone]

## 2017-07-26 NOTE — Patient Instructions (Addendum)
Medication Instructions:  Your physician has recommended you make the following change in your medication:  1.  START Crestor 5 mg daily  Labwork: 09/06/17:  ANYTIME THAT MORNING.. FASTING LIPID & LFT  Testing/Procedures: None ordered  Follow-Up: Your physician recommends that you schedule a follow-up appointment in: 09/13/17 ARRIVE AT 9:15 TO VISIT IN [place]  Your physician wants you to follow-up in: 1 YEAR WITH DR. Marlou Porch   You will receive a reminder letter in the mail two months in advance. If you don't receive a letter, please call our office to schedule the follow-up appointment.    Any Other Special Instructions Will Be Listed Below (If Applicable).   If you need a refill on your cardiac medications before your next appointment, please call your pharmacy.

## 2017-09-06 ENCOUNTER — Other Ambulatory Visit: Payer: Medicare Other | Admitting: *Deleted

## 2017-09-06 ENCOUNTER — Telehealth: Payer: Self-pay | Admitting: *Deleted

## 2017-09-06 DIAGNOSIS — E785 Hyperlipidemia, unspecified: Secondary | ICD-10-CM

## 2017-09-06 DIAGNOSIS — E7800 Pure hypercholesterolemia, unspecified: Secondary | ICD-10-CM

## 2017-09-06 LAB — HEPATIC FUNCTION PANEL
ALBUMIN: 4.7 g/dL (ref 3.5–4.8)
ALT: 11 IU/L (ref 0–32)
AST: 15 IU/L (ref 0–40)
Alkaline Phosphatase: 62 IU/L (ref 39–117)
BILIRUBIN TOTAL: 0.2 mg/dL (ref 0.0–1.2)
Bilirubin, Direct: 0.07 mg/dL (ref 0.00–0.40)
TOTAL PROTEIN: 7.3 g/dL (ref 6.0–8.5)

## 2017-09-06 LAB — LIPID PANEL
CHOL/HDL RATIO: 4 ratio (ref 0.0–4.4)
Cholesterol, Total: 242 mg/dL — ABNORMAL HIGH (ref 100–199)
HDL: 60 mg/dL (ref >39)
LDL CALC: 163 mg/dL — AB (ref 0–99)
Triglycerides: 95 mg/dL (ref 0–149)
VLDL Cholesterol Cal: 19 mg/dL (ref 5–40)

## 2017-09-06 MED ORDER — ROSUVASTATIN CALCIUM 10 MG PO TABS: 10.0000 mg | ORAL_TABLET | Freq: Every day | ORAL | 3 refills | Status: DC

## 2017-09-06 NOTE — Telephone Encounter (Signed)
-----   Message from Isaiah Serge, NP sent at 09/06/2017  3:45 PM EST ----- Her cholesterol is still elevated.  Is she taking the crestor ok?  If so then increase to 10 mg daily.  Her bad cholesterol is still elevated but is decreasing.   Good cholesterol is great at 60.  Recheck lipids and hepatic in 8 weeks.  Thanks.

## 2017-09-13 ENCOUNTER — Ambulatory Visit: Payer: Medicare Other | Admitting: Pharmacist

## 2017-09-13 DIAGNOSIS — E782 Mixed hyperlipidemia: Secondary | ICD-10-CM

## 2017-09-13 NOTE — Patient Instructions (Signed)
It was nice to meet you today  Please continue taking your rosuvastatin (Crestor) 10mg  daily for your cholesterol  Keep your fasting cholesterol lab work on 5/1  Call Megan in the lipid clinic with any side effects or concerns [phone]

## 2017-09-13 NOTE — Progress Notes (Signed)
Patient ID: Erica Carlson                 DOB: 12-30-1945                    MRN: [id number]     HPI: Erica Carlson is a 72 y.o. female patient of [name] referred to lipid clinic by Cecilie Kicks, NP. PMH is significant for HLD, HTN, and obesity.   She has a history of myalgias in her back and legs with atorvastatin and GI issues/vomiting with simvastatin. At her visit with Mickel Baas in January 2019, she was started on Crestor 5mg  daily. She reported tolerating this well and her LDL dropped from 204 to 163 last week. She was instructed to increase her Crestor to 10mg  daily and presents today for follow up. She is already scheduled for repeat lipids and LFTs in another 8 weeks.  Patient presents today in good spirits. She has been feeling well on Crestor 10mg  for the past week since her dose increase. She does not report any family history of heart disease or personal history of CAD.   Current Medications: Crestor 10mg  daily Intolerances: simvastatin 20mg  and 40mg  daily, atorvastatin 20mg  daily Risk Factors: LDL > 200, obesity LDL goal: 130mg /dL  Diet: Does not eat fried food or pork. Likes vegetables, fruit, and nuts. Eats chicken and fish. Drinks 80 oz of water a day.  Exercise: She is active and goes to the gym 3 X week and has a Physiological scientist.   Family History: Breast cancer in her sister, hyperlipidemia in her son.  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 She denies tobacco, alcohol, and illicit drug use.  Labs: 09/06/17: TC 242, TG 95, HDL 60, LDL 163 (Crestor 5mg  daily) 07/01/17: TC 282, TG 141, HDL 49, LDL 204 (baseline)  Past Medical History:  Diagnosis Date  . Arthritis    osteo arthritis of right knee  . 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 Pneumonia     Current Outpatient Medications on File Prior to Visit  Medication Sig Dispense Refill  . amLODipine (NORVASC) 10 MG  tablet Take 1 tablet (10 mg total) by mouth daily. 30 tablet 11  . Cholecalciferol (VITAMIN D3 PO) Take 2 capsules by mouth daily.    Marland Kitchen lisinopril (PRINIVIL,ZESTRIL) 10 MG tablet Take 0.5 tablets (5 mg total) by mouth daily. 15 tablet 11  . rosuvastatin (CRESTOR) 10 MG tablet Take 1 tablet (10 mg total) by mouth daily. 90 tablet 3   No current facility-administered medications on file prior to visit.     Allergies  Allergen Reactions  . Atorvastatin Other (See Comments)    Ache in back and legs  . Hctz [Hydrochlorothiazide] Nausea And Vomiting    Nausea and vomiting     Assessment/Plan:  1. Hyperlipidemia - Pt is feeling well on Crestor 10mg  daily for the past week. Will continue therapy and keep f/u labs on 5/1. LDL goal < 130 for primary prevention with minimal risk factors other than baseline LDL > 200. Pt is negative for FH due to lack of family or personal history of CAD or physical manifestations of elevated cholesterol. Advised pt to call clinic with any side effects; can add Zetia if needed. Otherwise, will plan to continue titrating Crestor until LDL is at goal.   Megan E. Supple, PharmD, CPP, BCACP [facility] [address] [place], [place] [address] Phone: [phone]; Fax: [phone] 09/13/2017 10:10 AM

## 2017-10-07 ENCOUNTER — Other Ambulatory Visit: Payer: Self-pay | Admitting: Family Medicine

## 2017-10-07 DIAGNOSIS — Z1231 Encounter for screening mammogram for malignant neoplasm of breast: Secondary | ICD-10-CM

## 2017-10-09 ENCOUNTER — Ambulatory Visit
Admission: RE | Admit: 2017-10-09 | Discharge: 2017-10-09 | Disposition: A | Discharge status: Home / Self Care | Payer: Medicare Other | Source: Ambulatory Visit | Attending: Family Medicine | Admitting: Family Medicine

## 2017-10-09 ENCOUNTER — Ambulatory Visit: Payer: Medicare Other

## 2017-10-09 DIAGNOSIS — Z1231 Encounter for screening mammogram for malignant neoplasm of breast: Secondary | ICD-10-CM

## 2017-11-06 ENCOUNTER — Other Ambulatory Visit: Payer: Medicare Other

## 2018-02-14 ENCOUNTER — Encounter: Payer: Self-pay | Admitting: Gastroenterology

## 2018-02-21 ENCOUNTER — Encounter: Payer: Self-pay | Admitting: Gastroenterology

## 2018-02-27 ENCOUNTER — Inpatient Hospital Stay: Payer: Medicare Other | Attending: Hematology and Oncology

## 2018-02-27 DIAGNOSIS — C8300 Small cell B-cell lymphoma, unspecified site: Secondary | ICD-10-CM | POA: Insufficient documentation

## 2018-02-27 LAB — CBC WITH DIFFERENTIAL/PLATELET
Basophils Absolute: 0 10*3/uL (ref 0.0–0.1)
Basophils Relative: 0 %
Eosinophils Absolute: 0.3 10*3/uL (ref 0.0–0.5)
Eosinophils Relative: 2 %
HCT: 36.4 % (ref 34.8–46.6)
HEMOGLOBIN: 11.6 g/dL (ref 11.6–15.9)
LYMPHS ABS: 11.1 10*3/uL — AB (ref 0.9–3.3)
Lymphocytes Relative: 81 %
MCH: 26.8 pg (ref 25.1–34.0)
MCHC: 31.9 g/dL (ref 31.5–36.0)
MCV: 84.2 fL (ref 79.5–101.0)
MONOS PCT: 2 %
Monocytes Absolute: 0.3 10*3/uL (ref 0.1–0.9)
NEUTROS PCT: 15 %
Neutro Abs: 2.1 10*3/uL (ref 1.5–6.5)
Platelets: 255 10*3/uL (ref 145–400)
RBC: 4.32 MIL/uL (ref 3.70–5.45)
RDW: 13.6 % (ref 11.2–14.5)
WBC: 13.7 10*3/uL — ABNORMAL HIGH (ref 3.9–10.3)

## 2018-02-27 LAB — COMPREHENSIVE METABOLIC PANEL
ALK PHOS: 75 U/L (ref 38–126)
ALT: 16 U/L (ref 0–44)
ANION GAP: 7 (ref 5–15)
AST: 16 U/L (ref 15–41)
Albumin: 4.1 g/dL (ref 3.5–5.0)
BILIRUBIN TOTAL: 0.3 mg/dL (ref 0.3–1.2)
BUN: 17 mg/dL (ref 8–23)
CALCIUM: 9.7 mg/dL (ref 8.9–10.3)
CO2: 28 mmol/L (ref 22–32)
Chloride: 108 mmol/L (ref 98–111)
Creatinine, Ser: 0.95 mg/dL (ref 0.44–1.00)
GFR calc Af Amer: >60 mL/min (ref >60)
GFR calc non Af Amer: 59 mL/min — ABNORMAL LOW (ref >60)
Glucose, Bld: 90 mg/dL (ref 70–99)
Potassium: 3.9 mmol/L (ref 3.5–5.1)
Sodium: 143 mmol/L (ref 135–145)
TOTAL PROTEIN: 7.2 g/dL (ref 6.5–8.1)

## 2018-02-27 LAB — LACTATE DEHYDROGENASE: LDH: 308 U/L — ABNORMAL HIGH (ref 98–192)

## 2018-02-28 ENCOUNTER — Inpatient Hospital Stay: Payer: Medicare Other | Admitting: Hematology and Oncology

## 2018-02-28 ENCOUNTER — Telehealth: Payer: Self-pay | Admitting: Hematology and Oncology

## 2018-02-28 DIAGNOSIS — C8300 Small cell B-cell lymphoma, unspecified site: Secondary | ICD-10-CM | POA: Diagnosis not present

## 2018-02-28 NOTE — Telephone Encounter (Signed)
Appts scheduled AVS declined due to my chart/ calendar printed per 8/23 los

## 2018-02-28 NOTE — Progress Notes (Signed)
Patient Care Team: Tower, Wynelle Fanny, MD as PCP - General (Family Medicine) Nicholas Lose, MD as Consulting Physician (Hematology and Oncology) Galvin Proffer, OD as Consulting Physician (Optometry)  DIAGNOSIS:  Encounter Diagnosis  Name Primary?  . 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  She has no symptoms related to the lymphoma other than swelling underneath the arm.  She thinks that the swelling is slightly worse than before.  She denies any fevers or chills or night sweats or weight loss or any pain in the lymph nodes.  REVIEW OF SYSTEMS:   Constitutional: Denies fevers, chills or abnormal weight loss Eyes: Denies blurriness of vision Ears, nose, mouth, throat, and face: Denies mucositis or sore throat Respiratory: Denies cough, dyspnea or wheezes Cardiovascular: Denies palpitation, chest discomfort Gastrointestinal:  Denies nausea, heartburn or change in bowel habits Skin: Denies abnormal skin rashes Lymphatics: Prominent bilateral axillary and right-sided cervical lymphadenopathy Neurological:Denies numbness, tingling or new weaknesses Behavioral/Psych: Mood is stable, no new changes  Extremities: No lower extremity edema  All other systems were reviewed with the patient and are negative.  I have reviewed the past medical history, past surgical history, social history and family history with the patient and they are unchanged from previous note.  ALLERGIES:  is allergic to atorvastatin and hctz [hydrochlorothiazide].  MEDICATIONS:  Current Outpatient Medications    Medication Sig Dispense Refill  . amLODipine (NORVASC) 10 MG tablet Take 1 tablet (10 mg total) by mouth daily. 30 tablet 11  . Cholecalciferol (VITAMIN D3 PO) Take 2 capsules by mouth daily.    Marland Kitchen lisinopril (PRINIVIL,ZESTRIL) 10 MG tablet Take 0.5 tablets (5 mg total) by mouth daily. 15 tablet 11  . rosuvastatin (CRESTOR) 10 MG tablet Take 1 tablet (10 mg total) by mouth daily. 90 tablet 3   No current facility-administered medications for this visit.     PHYSICAL EXAMINATION: ECOG PERFORMANCE STATUS: 1 - Symptomatic but completely ambulatory  Vitals:   02/28/18 1035  BP: (!) 173/84  Pulse: 82  Resp: 18  Temp: 98.6 F (37 C)  SpO2: 100%   Filed Weights   02/28/18 1035  Weight: 168 lb (76.2 kg)    GENERAL:alert, no distress and comfortable SKIN: skin color, texture, turgor are normal, no rashes or significant lesions EYES: normal, Conjunctiva are pink and non-injected, sclera clear OROPHARYNX:no exudate, no erythema and lips, buccal mucosa, and tongue normal  NECK: supple, thyroid normal size, non-tender, without nodularity LYMPH: Palpable right cervical and bilateral axillary lymphadenopathy right axilla is worse than left LUNGS: clear to auscultation and percussion with normal breathing effort HEART: regular rate & rhythm and no murmurs and no lower extremity edema ABDOMEN:abdomen soft, non-tender and normal bowel sounds MUSCULOSKELETAL:no cyanosis of digits and no clubbing  NEURO: alert & oriented x 3 with fluent speech, no focal motor/sensory deficits EXTREMITIES: No lower extremity edema   LABORATORY DATA:  I have reviewed the data as listed CMP Latest Ref Rng & Units 02/27/2018 09/06/2017 07/01/2017  Glucose 70 - 99 mg/dL 90 - 86  BUN 8 - 23 mg/dL 17 - 26(H)  Creatinine 0.44 -  1.00 mg/dL 0.95 - 1.07  Sodium 135 - 145 mmol/L 143 - 142  Potassium 3.5 - 5.1 mmol/L 3.9 - 4.1  Chloride 98 - 111 mmol/L 108 - 109  CO2 22 - 32 mmol/L 28 - 28  Calcium 8.9 - 10.3 mg/dL 9.7  - 9.5  Total Protein 6.5 - 8.1 g/dL 7.2 7.3 7.2  Total Bilirubin 0.3 - 1.2 mg/dL 0.3 0.2 0.3  Alkaline Phos 38 - 126 U/L 75 62 63  AST 15 - 41 U/L 16 15 16   ALT 0 - 44 U/L 16 11 11     Lab Results  Component Value Date   WBC 13.7 (H) 02/27/2018   HGB 11.6 02/27/2018   HCT 36.4 02/27/2018   MCV 84.2 02/27/2018   PLT 255 02/27/2018   NEUTROABS 2.1 02/27/2018    ASSESSMENT & PLAN:  Lymphoma, small lymphocytic ([facility]) Right axillary lymph node biopsy: 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 Patient has prominent bilateral axillary and right cervical lymph nodes.  Under the arm he feels like a golf ball  Treatment:Since the patient is asymptomatic, I recommend watchful waiting and monitoring.  Lab review:  LDH 308 on 02/25/2018 (was 268 on 02/29/2016) SPEP: M spike not observed CBC differential: White count  13.7, ANC 2, platelets 255, hemoglobin  11.6, absolute lymphocyte count 11.1 I discussed with herthe lab results from yesterday  Return to clinic 1 year for follow-up with labs    No orders of the defined types were placed in this encounter.  The patient has a good understanding of the overall plan. she agrees with it. she will call with any problems that may develop before the next visit here.   Harriette Ohara, MD 02/28/18

## 2018-02-28 NOTE — Assessment & Plan Note (Signed)
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  Treatment:Since the patient is asymptomatic, I recommend watchful waiting and monitoring.  Lab review:  LDH 308 on 02/25/2018 (was 268 on 02/29/2016) SPEP: M spike not observed CBC differential: White count  13.7, ANC 2, platelets 255, hemoglobin  11.6, absolute lymphocyte count 11.1 I discussed with herthe lab results from yesterday  Return to clinic 1 year for follow-up with labs

## 2018-03-31 ENCOUNTER — Ambulatory Visit (AMBULATORY_SURGERY_CENTER): Payer: Self-pay | Admitting: *Deleted

## 2018-03-31 ENCOUNTER — Encounter: Payer: Self-pay | Admitting: Gastroenterology

## 2018-03-31 VITALS — Ht 62.0 in | Wt 165.2 lb

## 2018-03-31 DIAGNOSIS — Z1211 Encounter for screening for malignant neoplasm of colon: Secondary | ICD-10-CM

## 2018-03-31 NOTE — Progress Notes (Signed)
No egg or soy allergy known to patient  No issues with past sedation with any surgeries  or procedures, no intubation problems  No diet pills per patient No home 02 use per patient  No blood thinners per patient  Pt states  issues with constipation  Ans she has hemorrhoids- she occ uses a medicine- if she drinks a lot of water she will have  daily soft stools  No A fib or A flutter  EMMI video sent to pt's e mail

## 2018-04-14 ENCOUNTER — Encounter: Payer: Self-pay | Admitting: Gastroenterology

## 2018-04-14 ENCOUNTER — Ambulatory Visit (AMBULATORY_SURGERY_CENTER): Payer: Medicare Other | Admitting: Gastroenterology

## 2018-04-14 VITALS — BP 118/79 | HR 63 | Temp 97.5°F | Resp 15 | Ht 61.0 in | Wt 168.0 lb

## 2018-04-14 DIAGNOSIS — Z1211 Encounter for screening for malignant neoplasm of colon: Secondary | ICD-10-CM

## 2018-04-14 MED ORDER — SODIUM CHLORIDE 0.9 % IV SOLN: 500.0000 mL | Freq: Once | INTRAVENOUS | Status: DC

## 2018-04-14 NOTE — Op Note (Signed)
[facility] Patient Name: Erica Carlson Procedure Date: 04/14/2018 9:11 AM MRN: [id number] Endoscopist: Mauri Pole , MD Age: 72 Referring MD:  Date of Birth: [birth year] Gender: Female Account #: [id number] Procedure:                Colonoscopy Indications:              Screening for colorectal malignant neoplasm, Last                            colonoscopy: 2009 Medicines:                Monitored Anesthesia Care Procedure:                Pre-Anesthesia Assessment:                           - Prior to the procedure, a History and Physical                            was performed, and patient medications and                            allergies were reviewed. The patient's tolerance of                            previous anesthesia was also reviewed. The risks                            and benefits of the procedure and the sedation                            options and risks were discussed with the patient.                            All questions were answered, and informed consent                            was obtained. Prior Anticoagulants: The patient has                            taken no previous anticoagulant or antiplatelet                            agents. ASA Grade Assessment: II - A patient with                            mild systemic disease. After reviewing the risks                            and benefits, the patient was deemed in                            satisfactory condition to undergo the procedure.  After obtaining informed consent, the colonoscope                            was passed under direct vision. Throughout the                            procedure, the patient's blood pressure, pulse, and                            oxygen saturations were monitored continuously. The                            Model PCF-[id number]) scope was introduced                            through the anus and advanced to  the the cecum,                            identified by the appendiceal orifice, ileocecal                            valve and palpation. The patient tolerated the                            procedure well. The quality of the bowel                            preparation was excellent. The ileocecal valve,                            appendiceal orifice, and rectum were photographed.                            The colonoscopy was somewhat difficult due to the                            patient's oxygen desaturation. Successful                            completion of the procedure was aided by performing                            chin lift and administering oxygen. Scope In: 9:17:19 AM Scope Out: 9:28:44 AM Scope Withdrawal Time: 0 hours 8 minutes 1 second  Total Procedure Duration: 0 hours 11 minutes 25 seconds  Findings:                 Hemorrhoids were found on perianal exam.                           Non-bleeding external and internal hemorrhoids were                            found during retroflexion. The hemorrhoids were  large.                           A few small-mouthed diverticula were found in the                            sigmoid colon and ascending colon.                           The exam was otherwise without abnormality. Complications:            No immediate complications. Estimated Blood Loss:     Estimated blood loss was minimal. Impression:               - Hemorrhoids found on perianal exam.                           - Non-bleeding external and internal hemorrhoids.                           - Diverticulosis in the sigmoid colon and in the                            ascending colon.                           - The examination was otherwise normal.                           - No specimens collected. Recommendation:           - Patient has a contact number available for                            emergencies. The signs and symptoms of  potential                            delayed complications were discussed with the                            patient. Return to normal activities tomorrow.                            Written discharge instructions were provided to the                            patient.                           - Resume previous diet.                           - Continue present medications.                           - No repeat colonoscopy due to age.                           -  Return to GI office for hemorrhoidal band                            ligation if needed.                           - Benefiber 1 teaspoon three times daily with meals Mauri Pole, MD 04/14/2018 9:33:26 AM This report has been signed electronically.

## 2018-04-14 NOTE — Patient Instructions (Addendum)
Handout given on hemorrhoids and diverticulosis. Benefiber 1 tsp up to three times daily.   YOU HAD AN ENDOSCOPIC PROCEDURE TODAY AT [facility]:   Refer to the procedure report that was given to you for any specific questions about what was found during the examination.  If the procedure report does not answer your questions, please call your gastroenterologist to clarify.  If you requested that your care partner not be given the details of your procedure findings, then the procedure report has been included in a sealed envelope for you to review at your convenience later.  YOU SHOULD EXPECT: Some feelings of bloating in the abdomen. Passage of more gas than usual.  Walking can help get rid of the air that was put into your GI tract during the procedure and reduce the bloating. If you had a lower endoscopy (such as a colonoscopy or flexible sigmoidoscopy) you may notice spotting of blood in your stool or on the toilet paper. If you underwent a bowel prep for your procedure, you may not have a normal bowel movement for a few days.  Please Note:  You might notice some irritation and congestion in your nose or some drainage.  This is from the oxygen used during your procedure.  There is no need for concern and it should clear up in a day or so.  SYMPTOMS TO REPORT IMMEDIATELY:   Following lower endoscopy (colonoscopy or flexible sigmoidoscopy):  Excessive amounts of blood in the stool  Significant tenderness or worsening of abdominal pains  Swelling of the abdomen that is new, acute  Fever of 100F or higher   For urgent or emergent issues, a gastroenterologist can be reached at any hour by calling [phone].   DIET:  We do recommend a small meal at first, but then you may proceed to your regular diet.  Drink plenty of fluids but you should avoid alcoholic beverages for 24 hours.  ACTIVITY:  You should plan to take it easy for the rest of today and you should NOT DRIVE or  use heavy machinery until tomorrow (because of the sedation medicines used during the test).    FOLLOW UP: Our staff will call the number listed on your records the next business day following your procedure to check on you and address any questions or concerns that you may have regarding the information given to you following your procedure. If we do not reach you, we will leave a message.  However, if you are feeling well and you are not experiencing any problems, there is no need to return our call.  We will assume that you have returned to your regular daily activities without incident.  If any biopsies were taken you will be contacted by phone or by letter within the next 1-3 weeks.  Please call us at [phone] if you have not heard about the biopsies in 3 weeks.    SIGNATURES/CONFIDENTIALITY: You and/or your care partner have signed paperwork which will be entered into your electronic medical record.  These signatures attest to the fact that that the information above on your After Visit Summary has been reviewed and is understood.  Full responsibility of the confidentiality of this discharge information lies with you and/or your care-partner.

## 2018-04-14 NOTE — Progress Notes (Signed)
To recovery, SaO2 in mid 90s on RA. Awake and alert with VSS.

## 2018-04-14 NOTE — Progress Notes (Signed)
Pt SaO2 dropping after initial sedation, Moving air well but sats remain low 80s. Mask by rebreather and SaO2 improves nto mid 90s.

## 2018-04-14 NOTE — Progress Notes (Signed)
Pt's states no medical or surgical changes since previsit or office visit. 

## 2018-04-15 ENCOUNTER — Telehealth: Payer: Self-pay | Admitting: *Deleted

## 2018-04-15 NOTE — Telephone Encounter (Signed)
  Follow up Call-  Call back number 04/14/2018  Post procedure Call Back phone  # [phone]  Permission to leave phone message Yes  Some recent data might be hidden     Patient questions:  Do you have a fever, pain , or abdominal swelling? No. Pain Score  0 *  Have you tolerated food without any problems? Yes.    Have you been able to return to your normal activities? Yes.    Do you have any questions about your discharge instructions: Diet   No. Medications  No. Follow up visit  No.  Do you have questions or concerns about your Care? No.  Actions: * If pain score is 4 or above: No action needed, pain <4.

## 2018-05-12 ENCOUNTER — Encounter: Payer: Self-pay | Admitting: Gastroenterology

## 2018-05-12 ENCOUNTER — Ambulatory Visit (INDEPENDENT_AMBULATORY_CARE_PROVIDER_SITE_OTHER): Payer: Medicare Other | Admitting: Gastroenterology

## 2018-05-12 VITALS — BP 162/84 | HR 72 | Ht 61.0 in | Wt 164.4 lb

## 2018-05-12 DIAGNOSIS — K641 Second degree hemorrhoids: Secondary | ICD-10-CM

## 2018-05-12 NOTE — Progress Notes (Signed)
PROCEDURE NOTE: The patient presents with symptomatic grade II hemorrhoids, requesting rubber band ligation of his/her hemorrhoidal disease.  All risks, benefits and alternative forms of therapy were described and informed consent was obtained.  In the Left Lateral Decubitus position anoscopic examination revealed grade II hemorrhoids in the right posterior, right anterior and left lateral position(s).  The anorectum was pre-medicated with 0.25% nitroglycerin and RectiCare The decision was made to band the Right posterior internal hemorrhoid, and the [place] was used to perform band ligation without complication.  Digital anorectal examination was then performed to assure proper positioning of the band, and to adjust the banded tissue as required.  The patient was discharged home without pain or other issues.  Dietary and behavioral recommendations were given and along with follow-up instructions.     The following adjunctive treatments were recommended: Benefiber 1 teaspoon 3 times daily  The patient will return in 2 to 4 weeks for  follow-up and possible additional banding as required. No complications were encountered and the patient tolerated the procedure well.  Damaris Hippo , MD [phone]

## 2018-05-12 NOTE — Patient Instructions (Signed)

## 2018-06-11 ENCOUNTER — Encounter: Payer: Self-pay | Admitting: Family Medicine

## 2018-06-11 ENCOUNTER — Encounter

## 2018-06-11 ENCOUNTER — Ambulatory Visit: Payer: Medicare Other | Admitting: Family Medicine

## 2018-06-11 VITALS — BP 152/80 | HR 90 | Temp 98.2°F | Ht 61.0 in | Wt 160.2 lb

## 2018-06-11 DIAGNOSIS — G8929 Other chronic pain: Secondary | ICD-10-CM

## 2018-06-11 DIAGNOSIS — R8290 Unspecified abnormal findings in urine: Secondary | ICD-10-CM

## 2018-06-11 DIAGNOSIS — M5441 Lumbago with sciatica, right side: Secondary | ICD-10-CM

## 2018-06-11 DIAGNOSIS — R1084 Generalized abdominal pain: Secondary | ICD-10-CM

## 2018-06-11 DIAGNOSIS — C8300 Small cell B-cell lymphoma, unspecified site: Secondary | ICD-10-CM | POA: Diagnosis not present

## 2018-06-11 DIAGNOSIS — I10 Essential (primary) hypertension: Secondary | ICD-10-CM | POA: Diagnosis not present

## 2018-06-11 DIAGNOSIS — R109 Unspecified abdominal pain: Secondary | ICD-10-CM | POA: Insufficient documentation

## 2018-06-11 LAB — POC URINALSYSI DIPSTICK (AUTOMATED)
Bilirubin, UA: NEGATIVE
Blood, UA: NEGATIVE
Glucose, UA: NEGATIVE
Ketones, UA: 15
Leukocytes, UA: NEGATIVE
PH UA: 6 (ref 5.0–8.0)
PROTEIN UA: POSITIVE — AB
Spec Grav, UA: 1.02 (ref 1.010–1.025)
Urobilinogen, UA: 0.2 E.U./dL

## 2018-06-11 MED ORDER — PROMETHAZINE HCL 25 MG PO TABS: 25.0000 mg | ORAL_TABLET | Freq: Three times a day (TID) | ORAL | 0 refills | Status: DC | PRN

## 2018-06-11 MED ORDER — FAMOTIDINE 20 MG PO TABS: 20.0000 mg | ORAL_TABLET | Freq: Two times a day (BID) | ORAL | 3 refills | Status: DC

## 2018-06-11 NOTE — Assessment & Plan Note (Signed)
Per pt on and off Better when she goes to the gym regularly  Still has radicular symptoms on her R leg  Unsure if some of her back pain could radiate to sides of abdomen

## 2018-06-11 NOTE — Assessment & Plan Note (Signed)
bp is up in setting of discomfort and also documented white coat syndrome  Will plan to re check when current medical problem is improved   BP: (!) 152/80

## 2018-06-11 NOTE — Assessment & Plan Note (Signed)
Generalized-most tender in epigastric area  Lab today  Also ua  tx with pepcid 20 mg bid and bland diet as tolerated Also phenergan (caution of sedation) for nausea   inst to alert us/seek care if symptoms suddenly worsen  Low threshold to image dep on lab results and resp to tx  Also note: hx of lymphoma in observation

## 2018-06-11 NOTE — Progress Notes (Signed)
Subjective:    Patient ID: Erica Carlson, female    DOB: [birth year], 72 y.o.   MRN: [id number]  HPI Here with c/o abdominal pain and back pain   Was getting over a cold (had temp that went away)   Started with pain in sides of abd and low back - started Monday  Some nausea and upper abd pain  Not sleeping = pain keeps her from sleeping  Has also felt tingly all over (occ itching) -esp upper arms (pins and needles) - also sensitive  No rash she can see or feel   She felt well enough to go to the gym on Friday    Today about the same  A little better when sitting still   No otc meds except tylenol and benadryl  Lemon and honey for cold    Wt Readings from Last 3 Encounters:  06/11/18 160 lb 4 oz (72.7 kg)  05/12/18 164 lb 6 oz (74.6 kg)  04/14/18 168 lb (76.2 kg)   30.28 kg/m    Hx of back pain with radiculopathy  Has seen Dr Nelva Bush and had injections  This bothers her more on days she cannot exercise  Radiates to her R leg  Somewhat bothersome today  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  HENT: Positive for congestion and rhinorrhea. Negative for ear discharge, ear pain, sinus pressure and sore throat.   Eyes: Negative for pain, redness and visual disturbance.  Respiratory: Negative for cough, shortness of breath and wheezing.   Cardiovascular: Negative for chest pain and palpitations.  Gastrointestinal: Positive for abdominal distention and nausea. Negative for abdominal pain, anal bleeding, blood in stool, constipation, diarrhea, rectal pain and vomiting.       Felt bloated yesterday   Endocrine: Negative for polydipsia and polyuria.  Genitourinary: Negative for dysuria, frequency and urgency.  Musculoskeletal: Positive for back pain. Negative for arthralgias, gait problem, joint swelling and myalgias.   Skin: Negative for pallor and rash.  Allergic/Immunologic: Negative for environmental allergies.  Neurological: Negative for dizziness, syncope and headaches.       Off balance last week-better now   Hematological: Negative for adenopathy. Does not bruise/bleed easily.  Psychiatric/Behavioral: Negative for decreased concentration and dysphoric mood. The patient is not nervous/anxious.        Objective:   Physical Exam  Constitutional: She appears well-developed and well-nourished.  Non-toxic appearance. She does not appear ill. No distress.  Well appearing   HENT:  Head: Normocephalic and atraumatic.  Mouth/Throat: Oropharynx is clear and moist.  Eyes: Pupils are equal, round, and reactive to light. EOM are normal. No scleral icterus.  Cardiovascular: Normal rate, regular rhythm, normal heart sounds and intact distal pulses.  No murmur heard. Pulmonary/Chest: Effort normal and breath sounds normal. No stridor. No respiratory distress. She has no wheezes. She has no rhonchi. She has no rales. She exhibits no tenderness.  Abdominal: Normal appearance and bowel sounds are normal. She exhibits no distension, no pulsatile liver, no fluid wave, no ascites, no pulsatile midline mass and no mass. There is no hepatosplenomegaly. There is generalized tenderness and tenderness in the epigastric area. There is no rigidity, no rebound, no guarding, no CVA tenderness, no tenderness at McBurney's point and negative Murphy's sign.  Musculoskeletal:  No bony or muscular lumbar tenderness No cva tenderness  Lymphadenopathy:  She has cervical adenopathy.  Neurological: She is alert. She displays normal reflexes. No cranial nerve deficit. She exhibits normal muscle tone. Coordination normal.  Skin: Skin is warm. No rash noted. She is not diaphoretic. No cyanosis. No pallor.  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    Seeing oncology and watched closely  Lab Results  Component Value Date   WBC 13.7 (H) 02/27/2018   Will re check today in light of abd pain  She also has more palpable cervical adenopathy today  States she had a recent cold with low grade temp-now improved        Relevant Medications   promethazine (PHENERGAN) 25 MG tablet   Other Relevant Orders   CBC with Differential/Platelet   Low back pain    Per pt on and off Better when she goes to the gym regularly  Still has radicular symptoms on her R leg  Unsure if some of her back pain could radiate to sides of abdomen      Abdominal pain    Generalized-most tender in epigastric area  Lab today  Also ua  tx with pepcid 20 mg bid and bland diet as tolerated Also phenergan (caution of sedation) for nausea   inst to alert us/seek care if symptoms suddenly worsen  Low threshold to image dep on lab results and resp to tx  Also note: hx of lymphoma in observation       Relevant Orders   CBC with Differential/Platelet   Comprehensive metabolic panel   Lipase

## 2018-06-11 NOTE — Patient Instructions (Signed)
Avoid ibuprofen or naproxen or other nsaids (they can bother stomach)   Eat a bland diet -avoid caffeine and soda and spicy foods  Continue to drink fluids   Labs today and urine sample   Start pepcid 20 mg twice daily  Take one now , one at bedtime and then am/pm   We will make a plan when labs return

## 2018-06-11 NOTE — Assessment & Plan Note (Signed)
Seeing oncology and watched closely  Lab Results  Component Value Date   WBC 13.7 (H) 02/27/2018   Will re check today in light of abd pain  She also has more palpable cervical adenopathy today  States she had a recent cold with low grade temp-now improved

## 2018-06-12 LAB — COMPREHENSIVE METABOLIC PANEL
ALBUMIN: 4.4 g/dL (ref 3.5–5.2)
ALT: 10 U/L (ref 0–35)
AST: 19 U/L (ref 0–37)
Alkaline Phosphatase: 65 U/L (ref 39–117)
BUN: 15 mg/dL (ref 6–23)
CHLORIDE: 96 meq/L (ref 96–112)
CO2: 27 mEq/L (ref 19–32)
Calcium: 10 mg/dL (ref 8.4–10.5)
Creatinine, Ser: 1 mg/dL (ref 0.40–1.20)
GFR: 70.15 mL/min (ref >60.00)
Glucose, Bld: 99 mg/dL (ref 70–99)
POTASSIUM: 4 meq/L (ref 3.5–5.1)
Sodium: 133 mEq/L — ABNORMAL LOW (ref 135–145)
TOTAL PROTEIN: 8 g/dL (ref 6.0–8.3)
Total Bilirubin: 0.5 mg/dL (ref 0.2–1.2)

## 2018-06-12 LAB — CBC WITH DIFFERENTIAL/PLATELET
BASOS PCT: 0.7 % (ref 0.0–3.0)
Basophils Absolute: 0.1 10*3/uL (ref 0.0–0.1)
EOS PCT: 2.4 % (ref 0.0–5.0)
Eosinophils Absolute: 0.4 10*3/uL (ref 0.0–0.7)
HCT: 35.8 % — ABNORMAL LOW (ref 36.0–46.0)
Hemoglobin: 11.6 g/dL — ABNORMAL LOW (ref 12.0–15.0)
LYMPHS ABS: 11.1 10*3/uL — AB (ref 0.7–4.0)
Lymphocytes Relative: 68 % — ABNORMAL HIGH (ref 12.0–46.0)
MCHC: 32.4 g/dL (ref 30.0–36.0)
MCV: 83.8 fl (ref 78.0–100.0)
MONOS PCT: 4 % (ref 3.0–12.0)
Monocytes Absolute: 0.6 10*3/uL (ref 0.1–1.0)
Neutro Abs: 4 10*3/uL (ref 1.4–7.7)
Neutrophils Relative %: 24.9 % — ABNORMAL LOW (ref 43.0–77.0)
Platelets: 358 10*3/uL (ref 150.0–400.0)
RBC: 4.27 Mil/uL (ref 3.87–5.11)
RDW: 13.6 % (ref 11.5–15.5)
WBC: 16.2 10*3/uL — ABNORMAL HIGH (ref 4.0–10.5)

## 2018-06-12 LAB — URINE CULTURE
MICRO NUMBER:: 91452318
SPECIMEN QUALITY:: ADEQUATE

## 2018-06-12 LAB — LIPASE: LIPASE: 8 U/L — AB (ref 11.0–59.0)

## 2018-06-13 ENCOUNTER — Ambulatory Visit (INDEPENDENT_AMBULATORY_CARE_PROVIDER_SITE_OTHER)
Admission: RE | Admit: 2018-06-13 | Discharge: 2018-06-13 | Disposition: A | Discharge status: Home / Self Care | Payer: Medicare Other | Source: Ambulatory Visit | Attending: Family Medicine | Admitting: Family Medicine

## 2018-06-13 ENCOUNTER — Telehealth: Payer: Self-pay | Admitting: *Deleted

## 2018-06-13 DIAGNOSIS — G8929 Other chronic pain: Secondary | ICD-10-CM | POA: Diagnosis not present

## 2018-06-13 DIAGNOSIS — M5441 Lumbago with sciatica, right side: Secondary | ICD-10-CM | POA: Diagnosis not present

## 2018-06-13 MED ORDER — METHOCARBAMOL 750 MG PO TABS: 750.0000 mg | ORAL_TABLET | Freq: Three times a day (TID) | ORAL | 1 refills | Status: DC | PRN

## 2018-06-13 NOTE — Telephone Encounter (Signed)
Is it more middle or low back or both? (I was interested in getting xrays-that may be what she is coming for Monday) Glad the abd pain is improved   What is she taking for back pain now?  Thanks  Has she seen an orthopedic specialist

## 2018-06-13 NOTE — Telephone Encounter (Signed)
Daughter notified Rx sent to pharmacy and X-ray as planned

## 2018-06-13 NOTE — Telephone Encounter (Signed)
Spoke to pts daughter, Fleta, who states pt was seen 12/4 with c/o abdominal and back pain. Daughter states pt's abdominal pain has improved, but she is still having "really bad back pain" which is disturbing her sleep at night. She has an appt with a chiropractor on Monday, but is wanting to know if Dr Glori Bickers can prescribe her something for pain,or advise on what she can take to help with her pain, until she can be seen on Monday. pls advise

## 2018-06-13 NOTE — Telephone Encounter (Signed)
See lab results, pt is coming in today for x-rays, please put order in. Pt has an appt with her chiropractor Monday not here. Daughter said she tried 800mg  Ibuprofen and it didn't help at all. The pain is her lower back and it's severe. Daughter said pt doesn't want to see ortho because she has seen them in the past for this back pain and they gave her painful injections that stopped working and she refuses any type of surgical procedures. That's why she is going back to her chiropractor on Monday but daughter said that her pain is so severe that she can hardly get out the bed or move so she is requesting something for pain to help. Daughter said pt can't take tramadol because it make her feel "off" and funny. And anything with hydrocodone causes her to become constipated so daughter wants to stay away from those 2 meds but says pt needs something because she can't be in this much pain all weekend

## 2018-06-13 NOTE — Telephone Encounter (Signed)
LS films ordered I decided against cxr since abd pain is improved   I sent methocarbamol to her pharmacy to try  Caution of sedation

## 2018-06-16 ENCOUNTER — Encounter: Payer: Self-pay | Admitting: Family Medicine

## 2018-06-16 ENCOUNTER — Ambulatory Visit: Payer: Medicare Other | Admitting: Family Medicine

## 2018-06-16 ENCOUNTER — Ambulatory Visit (INDEPENDENT_AMBULATORY_CARE_PROVIDER_SITE_OTHER)
Admission: RE | Admit: 2018-06-16 | Discharge: 2018-06-16 | Disposition: A | Discharge status: Home / Self Care | Payer: Medicare Other | Source: Ambulatory Visit | Attending: Family Medicine | Admitting: Family Medicine

## 2018-06-16 VITALS — BP 114/68 | HR 92 | Temp 98.2°F | Ht 61.0 in | Wt 156.2 lb

## 2018-06-16 DIAGNOSIS — M545 Low back pain, unspecified: Secondary | ICD-10-CM

## 2018-06-16 DIAGNOSIS — R05 Cough: Secondary | ICD-10-CM | POA: Diagnosis not present

## 2018-06-16 DIAGNOSIS — R52 Pain, unspecified: Secondary | ICD-10-CM

## 2018-06-16 DIAGNOSIS — C8300 Small cell B-cell lymphoma, unspecified site: Secondary | ICD-10-CM | POA: Diagnosis not present

## 2018-06-16 DIAGNOSIS — R634 Abnormal weight loss: Secondary | ICD-10-CM

## 2018-06-16 DIAGNOSIS — R61 Generalized hyperhidrosis: Secondary | ICD-10-CM | POA: Diagnosis not present

## 2018-06-16 DIAGNOSIS — R059 Cough, unspecified: Secondary | ICD-10-CM

## 2018-06-16 LAB — POC INFLUENZA A&B (BINAX/QUICKVUE)
Influenza A, POC: POSITIVE — AB
Influenza B, POC: NEGATIVE

## 2018-06-16 MED ORDER — OSELTAMIVIR PHOSPHATE 75 MG PO CAPS: 75.0000 mg | ORAL_CAPSULE | Freq: Two times a day (BID) | ORAL | 0 refills | Status: DC

## 2018-06-16 NOTE — Assessment & Plan Note (Addendum)
Acute worsening left lower back pain - reviewed lumbar films from last week - degenerative lumbar arthritis.

## 2018-06-16 NOTE — Patient Instructions (Signed)
Flu swab faintly positive for flu A - treat with tamiflu.  Push fluids and rest. Let us know if not improving with treatment for further labs (repeat blood count) and return to see Dr Lindi Adie sooner.

## 2018-06-16 NOTE — Assessment & Plan Note (Addendum)
See above - symptoms concerning for lymphoma flare with constitutional symptoms - but she also has progressive productive cough - check CXR today, consider sooner f/u with onc. Discussed if not improving on tamiflu, would want her to come in for CBC and sooner eval with onc.

## 2018-06-16 NOTE — Assessment & Plan Note (Addendum)
Lungs sound clear today.  With recent exposure to flu and pneumonia, check CXR and flu swab. Flu swab faintly positive for influenza A. Given comorbidities, will treat with tamiflu 5d course.  CXR clear.  Red flags to seek further care reviewed, advised to let us know if not improving with treatment for further evaluation - see above.

## 2018-06-16 NOTE — Progress Notes (Signed)
BP 114/68 (BP Location: Left Arm, Patient Position: Sitting, Cuff Size: Normal)   Pulse 92   Temp 98.2 F (36.8 C) (Oral)   Ht 5\' 1"  (1.549 m)   Wt 156 lb 4 oz (70.9 kg)   SpO2 96%   BMI 29.52 kg/m    CC: new rash ?shingles Subjective:    Patient ID: Erica Carlson, female    DOB: [birth year], 72 y.o.   MRN: [id number]  HPI: Erica Carlson is a 72 y.o. female presenting on 06/16/2018 for Rash (C/o small rash on left lower back. Pain radiates from lower back around to left abd. Pain started 06/06/18. Pt has also lost her appetite. Pt accompanied by her granddaughter, [place]. ) and Generalized Body Aches (C/o cough, body aches and fatigue. Pt keeps her great grandson who has been dx with flu and pneumonia.)   1.5 wk h/o L lower back pain with radiation across lower abdomen anteriorly. Started with paresthesias, progressed to sharp pain. Appetite down, body aches, bumps popped up yesterday but may be better now.   She started coughing >1 wk ago as well. PNdrainage. Saw PCP last week for body aches, note reviewed. She had night sweats and weight loss last week since feeling ill. Tried muscle relaxant which caused headache.   Denies fevers, ear or tooth pain, dyspnea or wheezing, ST. No rhinorrhea, congestion.   Grandson with flu and pneumonia.   She had shingles in the past.   Known h/o small lymphocytic lymphoma - per pt overall stable ([place]). 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  Relevant past medical, surgical, family and social history reviewed and updated as indicated. Interim medical history since our last visit reviewed. Allergies and medications reviewed and updated. Outpatient Medications Prior to Visit  Medication Sig Dispense Refill  . amLODipine (NORVASC) 10 MG tablet Take 1 tablet (10 mg total) by mouth daily. 30 tablet 11  . Cholecalciferol (VITAMIN D-3) 5000 units TABS Take 1 tablet by mouth daily.    . famotidine (PEPCID) 20  MG tablet Take 1 tablet (20 mg total) by mouth 2 (two) times daily. 60 tablet 3  . lisinopril (PRINIVIL,ZESTRIL) 10 MG tablet Take 0.5 tablets (5 mg total) by mouth daily. 15 tablet 11  . methocarbamol (ROBAXIN) 750 MG tablet Take 1 tablet (750 mg total) by mouth 3 (three) times daily as needed for muscle spasms. Caution of sedation 30 tablet 1  . Multiple Vitamins-Minerals (PRESERVISION AREDS 2 PO) Take by mouth.    . promethazine (PHENERGAN) 25 MG tablet Take 1 tablet (25 mg total) by mouth every 8 (eight) hours as needed for nausea or vomiting. Caution of sedation 20 tablet 0  . rosuvastatin (CRESTOR) 10 MG tablet Take 1 tablet (10 mg total) by mouth daily. 90 tablet 3   No facility-administered medications prior to visit.      Per HPI unless specifically indicated in ROS section below Review of Systems     Objective:    BP 114/68 (BP Location: Left Arm, Patient Position: Sitting, Cuff Size: Normal)   Pulse 92   Temp 98.2 F (36.8 C) (Oral)   Ht 5\' 1"  (1.549 m)   Wt 156 lb 4 oz (70.9 kg)   SpO2 96%   BMI 29.52 kg/m   Wt Readings from Last 3 Encounters:  06/16/18 156 lb 4 oz (70.9 kg)  06/11/18 160 lb 4 oz (72.7 kg)  05/12/18 164 lb 6 oz (74.6 kg)    Physical  Exam  Constitutional: She appears well-developed and well-nourished. No distress.  Tired appearing  HENT:  Head: Normocephalic and atraumatic.  Right Ear: Hearing, tympanic membrane, external ear and ear canal normal.  Left Ear: Hearing, tympanic membrane, external ear and ear canal normal.  Nose: No mucosal edema or rhinorrhea. Right sinus exhibits no maxillary sinus tenderness and no frontal sinus tenderness. Left sinus exhibits no maxillary sinus tenderness and no frontal sinus tenderness.  Mouth/Throat: Uvula is midline and mucous membranes are normal. Posterior oropharyngeal erythema present. No oropharyngeal exudate, posterior oropharyngeal edema or tonsillar abscesses.  Nasal mucosal pallor  Eyes: Pupils are  equal, round, and reactive to light. Conjunctivae and EOM are normal. No scleral icterus.  Neck: Normal range of motion. Neck supple.  Cardiovascular: Normal rate, regular rhythm, normal heart sounds and intact distal pulses.  No murmur heard. Pulmonary/Chest: Effort normal and breath sounds normal. No respiratory distress. She has no wheezes. She has no rales.  Lungs clear  Abdominal: Soft. Bowel sounds are normal. She exhibits no distension and no mass. There is no hepatosplenomegaly. There is no tenderness. There is no guarding.  Musculoskeletal: She exhibits no edema.  Lymphadenopathy:    She has cervical adenopathy (bulky nontender enlarged posterior cervical LAD).  Skin: Skin is warm and dry. Rash noted.  2 isolated papules L lower back, nonvesicular  Nursing note and vitals reviewed.  Lab Results  Component Value Date   WBC 16.2 (H) 06/11/2018   HGB 11.6 (L) 06/11/2018   HCT 35.8 (L) 06/11/2018   MCV 83.8 06/11/2018   PLT 358.0 06/11/2018      Assessment & Plan:  I don't think 2 isolated papules L lower back are consistent with shingles.  Problem List Items Addressed This Visit    Lymphoma, small lymphocytic ([place])    See above - symptoms concerning for lymphoma flare with constitutional symptoms - but she also has progressive productive cough - check CXR today, consider sooner f/u with onc. Discussed if not improving on tamiflu, would want her to come in for CBC and sooner eval with onc.       Relevant Medications   oseltamivir (TAMIFLU) 75 MG capsule   Low back pain    Acute worsening left lower back pain - reviewed lumbar films from last week - degenerative lumbar arthritis.       Cough - Primary    Lungs sound clear today.  With recent exposure to flu and pneumonia, check CXR and flu swab. Flu swab faintly positive for influenza A. Given comorbidities, will treat with tamiflu 5d course.  CXR clear.  Red flags to seek further care reviewed, advised to let us know if  not improving with treatment for further evaluation - see above.       Relevant Orders   DG Chest 2 View (Completed)   POC Influenza A&B(BINAX/QUICKVUE) (Completed)    Other Visit Diagnoses    Body aches       Relevant Orders   DG Chest 2 View (Completed)   POC Influenza A&B(BINAX/QUICKVUE) (Completed)   Night sweats       Weight loss           Meds ordered this encounter  Medications  . oseltamivir (TAMIFLU) 75 MG capsule    Sig: Take 1 capsule (75 mg total) by mouth 2 (two) times daily.    Dispense:  10 capsule    Refill:  0   Orders Placed This Encounter  Procedures  . DG Chest 2  View    Standing Status:   Future    Number of Occurrences:   1    Standing Expiration Date:   08/18/2019    Order Specific Question:   Reason for Exam (SYMPTOM  OR DIAGNOSIS REQUIRED)    Answer:   body aches, productive cough, h/o lymphoma    Order Specific Question:   Preferred imaging location?    Answer:   [facility]    Order Specific Question:   Radiology Contrast Protocol - do NOT remove file path    Answer:   [email or web address]  . POC Influenza A&B(BINAX/QUICKVUE)    Follow up plan: No follow-ups on file.  Ria Bush, MD

## 2018-06-18 ENCOUNTER — Encounter: Payer: Medicare Other | Admitting: Gastroenterology

## 2018-06-23 ENCOUNTER — Ambulatory Visit: Payer: Medicare Other | Admitting: Family Medicine

## 2018-06-23 ENCOUNTER — Encounter: Payer: Self-pay | Admitting: Family Medicine

## 2018-06-23 VITALS — BP 130/78 | HR 76 | Temp 98.3°F | Ht 61.0 in | Wt 158.5 lb

## 2018-06-23 DIAGNOSIS — M5416 Radiculopathy, lumbar region: Secondary | ICD-10-CM

## 2018-06-23 DIAGNOSIS — M5136 Other intervertebral disc degeneration, lumbar region: Secondary | ICD-10-CM

## 2018-06-23 DIAGNOSIS — M5134 Other intervertebral disc degeneration, thoracic region: Secondary | ICD-10-CM

## 2018-06-23 MED ORDER — PREDNISONE 20 MG PO TABS: ORAL_TABLET | ORAL | 0 refills | Status: DC

## 2018-06-23 NOTE — Patient Instructions (Signed)
Take the methocarbamol tablet at night time before bed

## 2018-06-23 NOTE — Progress Notes (Signed)
Dr. Frederico Hamman T. Kidada Ging, MD, [facility] [place], [address] Phone: [phone] Fax: [phone]  06/23/2018  Patient: Erica Carlson, MRN: [id number], DOB: [birth year], 72 y.o.  Primary Physician:  Tower, Wynelle Fanny, MD   Chief Complaint  Patient presents with  . Back Pain    Radiates down legs   Subjective:   Erica Carlson is a 72 y.o. very pleasant female patient who presents with the following:  Known patient with a history of prior radicular back pain and disc disease with prior ESI from Dr. Nelva Bush in 2012 who presents with relative acute onset of radicular back pain now. Recently diagnosed with flu and she also has small lymphocytic lymphoma.   She has had problems with her back off and on for years, but the last time it flared up significantly was in 2015.  She has never had any prior surgery.  It is been ongoing now since approximately Thanksgiving.  She is taken some oral anti-inflammatories, but they did upset her stomach somewhat, and she also has taken some Robaxin, but she only took this for a very limited amount of time.  R leg down the R leg and also had R knee surgery. Arthroscopy and meniscal debridement on the R.  1st webspace a little less sensation on the R  Past Medical History, Surgical History, Social History, Family History, Problem List, Medications, and Allergies have been reviewed and updated if relevant.  Patient Active Problem List   Diagnosis Date Noted  . Cough 06/16/2018  . Abdominal pain 06/11/2018  . Need for hepatitis C screening test 06/26/2016  . Lymphoma, small lymphocytic ([place]) 02/28/2016  . Obesity (BMI 30-39.9) 10/01/2013  . Encounter for Medicare annual wellness exam 09/30/2013  . Estrogen deficiency 09/17/2012  . Special screening for malignant neoplasms, colon 12/15/2010  . Other screening mammogram 12/15/2010  . Routine general medical examination at a health care  facility 12/08/2010  . Low back pain 09/06/2010  . OSTEOARTHRITIS, KNEE, RIGHT 10/11/2008  . Internal hemorrhoids 09/02/2008  . Hypertension 04/28/2008  . PNEUMONIA, HX OF 03/03/2008  . Hyperlipidemia 02/26/2008    Past Medical History:  Diagnosis Date  . Allergy    spring  . Arthritis    osteo arthritis of right knee  . Cataract    forming  . Hyperlipidemia   . Hypertension   . Lymphoma, small-cell ([place])    right axillary   . Pneumonia     Past Surgical History:  Procedure Laterality Date  . ABDOMINAL HYSTERECTOMY    . AXILLARY LYMPH NODE BIOPSY Right 02/07/2016  . back injections    . BREAST SURGERY    . COLONOSCOPY  2009  . EYE SURGERY     retinal tear repair  . KNEE SURGERY     torn ALC per pt    Social History   Socioeconomic History  . Marital status: Widowed    Spouse name: n/a  . Number of children: 6  . Years of education: 71  . Highest education level: Not on file  Occupational History  . 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 Financial resource strain: Not on file  . Food insecurity:    Worry: Not on file    Inability: Not on file  . Transportation needs:    Medical: Not on file    Non-medical: Not on file  Tobacco Use  . Smoking status: Never Smoker  . Smokeless tobacco: Never Used  Substance and Sexual Activity  . Alcohol use: No    Alcohol/week: 0.0 standard drinks  . Drug use: No  . Sexual activity: Never  Lifestyle  . Physical activity:    Days per week: Not on file    Minutes per session: Not on file  . Stress: Not on file  Relationships  . Social connections:    Talks on phone: Not on file    Gets together: Not on file    Attends religious service: Not on file    Active member of club or organization: Not on file    Attends meetings of clubs or organizations: Not on file    Relationship status: Not on file  . Intimate partner violence:    Fear of  current or ex partner: Not on file    Emotionally abused: Not on file    Physically abused: Not on file    Forced sexual activity: Not on file  Other Topics Concern  . Not on file  Social History Narrative   Lives alone.   Her adult children live nearby.    Family History  Problem Relation Age of Onset  . Breast cancer Sister   . Hyperlipidemia Son   . Schizophrenia Mother   . Colon cancer Neg Hx   . Colon polyps Neg Hx   . Esophageal cancer Neg Hx   . Rectal cancer Neg Hx   . Stomach cancer Neg Hx     Allergies  Allergen Reactions  . Hctz [Hydrochlorothiazide] Nausea And Vomiting    Nausea and vomiting   . Lipitor [Atorvastatin] Other (See Comments)    Ache in back and legs    Medication list reviewed and updated in full in [place].  GEN: no acute illness or fever CV: No chest pain or shortness of breath MSK: detailed above Neuro: neurological signs are described above ROS O/w per HPI  Objective:   BP 130/78   Pulse 76   Temp 98.3 F (36.8 C) (Oral)   Ht 5\' 1"  (1.549 m)   Wt 158 lb 8 oz (71.9 kg)   BMI 29.95 kg/m    GEN: Well-developed,well-nourished,in no acute distress; alert,appropriate and cooperative throughout examination HEENT: Normocephalic and atraumatic without obvious abnormalities. Ears, externally no deformities PULM: Breathing comfortably in no respiratory distress EXT: No clubbing, cyanosis, or edema PSYCH: Normally interactive. Cooperative during the interview. Pleasant. Friendly and conversant. Not anxious or depressed appearing. Normal, full affect.  Range of motion at  the waist: Flexion, extension, lateral bending and rotation: Full  No echymosis or edema Rises to examination table with mild difficulty Gait: minimally antalgic  Inspection/Deformity: N Paraspinus Tenderness: minimal  B Ankle Dorsiflexion (L5,4): 5/5 B Great Toe Dorsiflexion (L5,4): 5/5 Heel Walk (L5): WNL Toe Walk (S1): WNL Rise/Squat (L4): WNL, mild  pain  SENSORY B Medial Foot (L4): WNL B Dorsum (L5): WNL B Lateral (S1): decreased on R to soft touch 1st webspace on R decreased to pinprick  REFLEXES Knee (L4): 2+ Ankle (S1): 2+  B SLR, seated: neg B SLR, supine: mild pos on R B FABER: neg B Reverse FABER: neg B Greater Troch: NT B Log Roll: neg B Stork: NT B Sciatic Notch: NT   Radiology: Dg Chest 2 View  Result Date: 06/16/2018 CLINICAL DATA:  Cough and body aches.  History of lymphoma EXAM: CHEST - 2 VIEW COMPARISON:  None. FINDINGS: The  heart size and mediastinal contours are within normal limits. Both lungs are clear. The visualized skeletal structures are unremarkable. IMPRESSION: No active cardiopulmonary disease. Electronically Signed   By: Franchot Gallo M.D.   On: 06/16/2018 16:25   Dg Lumbar Spine Complete  Result Date: 06/13/2018 CLINICAL DATA:  Acute on chronic low back pain, worse lately. EXAM: LUMBAR SPINE - COMPLETE 4+ VIEW COMPARISON:  09/06/2010. FINDINGS: Five non-rib-bearing lumbar vertebrae. Extensive lumbar and lower thoracic spine degenerative changes with progression. Stable grade 1 anterolisthesis at the L4-5 level with associated facet degenerative changes. No fractures, pars defects or subluxations. IMPRESSION: Extensive lumbar and lower thoracic spine degenerative changes with progression. No acute abnormality. Electronically Signed   By: Claudie Revering M.D.   On: 06/13/2018 17:42    Assessment and Plan:   Lumbar radiculopathy, right  DDD (degenerative disc disease), lumbar  DDD (degenerative disc disease), thoracic  She has a fairly extensive degenerative changes throughout her lower thoracic spine through her entirety of her lumbar spine.  I reviewed her films with her face-to-face today in the office.  Extensive degenerative disc changes.  History and exam is consistent with radicular symptoms on the right, more likely from either disc encroachment or osteophyte encroachment on the nerve on the  right side.  She has had this before going back for some time.  I am going to give the patient 10 days of prednisone, and also have her do Robaxin at nighttime only.  Her daughter is a massage therapist, so she is going to do some massage therapy as well.  She is quite active, and I am going to have her resume going to the gym, her range of motion and strength is remarkably good.  If symptoms persist after a few weeks, we could always consider some advanced imaging versus direct referral to physiatry for additional epidural steroid injection as she has done in the past.  Meds ordered this encounter  Medications  . predniSONE (DELTASONE) 20 MG tablet    Sig: 2 tabs po for 5 days, then 1 tab po for 5 days    Dispense:  15 tablet    Refill:  0   Signed,  Hoby Kawai T. Kristyn Obyrne, MD   Outpatient Encounter Medications as of 06/23/2018  Medication Sig  . amLODipine (NORVASC) 10 MG tablet Take 1 tablet (10 mg total) by mouth daily.  . Cholecalciferol (VITAMIN D-3) 5000 units TABS Take 1 tablet by mouth daily.  Marland Kitchen lisinopril (PRINIVIL,ZESTRIL) 10 MG tablet Take 0.5 tablets (5 mg total) by mouth daily.  . Multiple Vitamins-Minerals (PRESERVISION AREDS 2 PO) Take by mouth.  . rosuvastatin (CRESTOR) 10 MG tablet Take 1 tablet (10 mg total) by mouth daily.  . famotidine (PEPCID) 20 MG tablet Take 1 tablet (20 mg total) by mouth 2 (two) times daily. (Patient not taking: Reported on 06/23/2018)  . methocarbamol (ROBAXIN) 750 MG tablet Take 1 tablet (750 mg total) by mouth 3 (three) times daily as needed for muscle spasms. Caution of sedation (Patient not taking: Reported on 06/23/2018)  . predniSONE (DELTASONE) 20 MG tablet 2 tabs po for 5 days, then 1 tab po for 5 days  . [DISCONTINUED] oseltamivir (TAMIFLU) 75 MG capsule Take 1 capsule (75 mg total) by mouth 2 (two) times daily.  . [DISCONTINUED] promethazine (PHENERGAN) 25 MG tablet Take 1 tablet (25 mg total) by mouth every 8 (eight) hours as needed  for nausea or vomiting. Caution of sedation   No facility-administered encounter medications on file  as of 06/23/2018.

## 2018-07-03 ENCOUNTER — Encounter: Payer: Medicare Other | Admitting: Gastroenterology

## 2018-07-10 ENCOUNTER — Telehealth: Payer: Self-pay | Admitting: Family Medicine

## 2018-07-10 ENCOUNTER — Other Ambulatory Visit (INDEPENDENT_AMBULATORY_CARE_PROVIDER_SITE_OTHER): Payer: Medicare Other

## 2018-07-10 ENCOUNTER — Telehealth: Payer: Self-pay | Admitting: Radiology

## 2018-07-10 DIAGNOSIS — E782 Mixed hyperlipidemia: Secondary | ICD-10-CM | POA: Diagnosis not present

## 2018-07-10 DIAGNOSIS — I10 Essential (primary) hypertension: Secondary | ICD-10-CM

## 2018-07-10 LAB — CBC WITH DIFFERENTIAL/PLATELET
Basophils Absolute: 0.1 10*3/uL (ref 0.0–0.1)
Basophils Relative: 0.3 % (ref 0.0–3.0)
Eosinophils Absolute: 0.2 10*3/uL (ref 0.0–0.7)
Eosinophils Relative: 0.8 % (ref 0.0–5.0)
HCT: 36.5 % (ref 36.0–46.0)
Hemoglobin: 11.4 g/dL — ABNORMAL LOW (ref 12.0–15.0)
Lymphocytes Relative: 86.7 % — ABNORMAL HIGH (ref 12.0–46.0)
Lymphs Abs: 19.2 10*3/uL — ABNORMAL HIGH (ref 0.7–4.0)
MCHC: 31.3 g/dL (ref 30.0–36.0)
MCV: 85.6 fl (ref 78.0–100.0)
Monocytes Absolute: 0.3 10*3/uL (ref 0.1–1.0)
Monocytes Relative: 1.4 % — ABNORMAL LOW (ref 3.0–12.0)
Neutro Abs: 2.4 10*3/uL (ref 1.4–7.7)
Neutrophils Relative %: 10.8 % — ABNORMAL LOW (ref 43.0–77.0)
Platelets: 239 10*3/uL (ref 150.0–400.0)
RBC: 4.26 Mil/uL (ref 3.87–5.11)
RDW: 14.4 % (ref 11.5–15.5)
WBC: 22.1 10*3/uL (ref 4.0–10.5)

## 2018-07-10 LAB — TSH: TSH: 1.25 u[IU]/mL (ref 0.35–4.50)

## 2018-07-10 LAB — COMPREHENSIVE METABOLIC PANEL
ALT: 10 U/L (ref 0–35)
AST: 10 U/L (ref 0–37)
Albumin: 3.9 g/dL (ref 3.5–5.2)
Alkaline Phosphatase: 42 U/L (ref 39–117)
BUN: 14 mg/dL (ref 6–23)
CO2: 30 mEq/L (ref 19–32)
Calcium: 9.5 mg/dL (ref 8.4–10.5)
Chloride: 107 mEq/L (ref 96–112)
Creatinine, Ser: 1.21 mg/dL — ABNORMAL HIGH (ref 0.40–1.20)
GFR: 56.29 mL/min — ABNORMAL LOW (ref >60.00)
GLUCOSE: 95 mg/dL (ref 70–99)
Potassium: 4 mEq/L (ref 3.5–5.1)
SODIUM: 142 meq/L (ref 135–145)
TOTAL PROTEIN: 6.1 g/dL (ref 6.0–8.3)
Total Bilirubin: 0.4 mg/dL (ref 0.2–1.2)

## 2018-07-10 LAB — LIPID PANEL
Cholesterol: 266 mg/dL — ABNORMAL HIGH (ref 0–200)
HDL: 48.1 mg/dL (ref >39.00)
LDL Cholesterol: 189 mg/dL — ABNORMAL HIGH (ref 0–99)
NONHDL: 217.68
Total CHOL/HDL Ratio: 6
Triglycerides: 142 mg/dL (ref 0.0–149.0)
VLDL: 28.4 mg/dL (ref 0.0–40.0)

## 2018-07-10 NOTE — Telephone Encounter (Signed)
Spoke with pt and advise her of hight WBC count. Pt said she doesn't have any of the sxs list from Dr. Darnell Level except weight loss. Pt said that Dr. Glori Bickers gave her pepcid (alt med due to recall) and it's not helping at all. Pt said she feels like her food gets "stuck in her throat" and she has heart burn and is nauseous often so she doesn't really want to eat and doesn't have an appetite so she has lost some weight due to that but that's her only issue right now.

## 2018-07-10 NOTE — Telephone Encounter (Signed)
[facility] called a critical result, WBC - 22.1. Results given to Dr Danise Mina

## 2018-07-10 NOTE — Telephone Encounter (Signed)
-----   Message from Ellamae Sia sent at 07/03/2018 10:04 AM EST ----- Regarding: lab orders for Thursday, 1.2.20 Patient is scheduled for CPX labs, please order future labs, Thanks , Karna Christmas

## 2018-07-10 NOTE — Telephone Encounter (Signed)
I knew about the heartburn but not the swallowing issue -that is concerning  Please try inc the pepcid to 40 mg bid for a few days and let me know if that helps  I would also like to refer to GI-please ask if agreeable I suspect the inc in wbc is from her recent prednisone from Dr Lorelei Pont We will d/c at her f/u  Thanks Dr Darnell Level for answering this

## 2018-07-10 NOTE — Telephone Encounter (Signed)
Known SLL, seeing PCP next week. Will cc PCP fyi.  plz call pt - white count returned higher than prior. Any fever, night sweats, weight loss, or infection symptoms? Had flu last month - would ensure feeling better from that. If feeling overall ok, may just wait to see PCP next week.

## 2018-07-11 ENCOUNTER — Telehealth: Payer: Self-pay | Admitting: *Deleted

## 2018-07-11 NOTE — Telephone Encounter (Signed)
Spoke to pts daughter Keshonna Valvo contacted office and inquired about pts recent lab results

## 2018-07-11 NOTE — Telephone Encounter (Signed)
No DPR on file so I can't talk to daughter

## 2018-07-11 NOTE — Telephone Encounter (Signed)
Pt notified of Dr. Marliss Coots comments. She will increase her med to 2 pills BID until she sees Dr. Glori Bickers on Monday, pt wants to discuss the GI referral in person when she sees Dr. Glori Bickers on Monday

## 2018-07-11 NOTE — Telephone Encounter (Signed)
If she has DPR can release Wbc ct was up more than usual- I think that if from her recent prednisone from Dr Lorelei Pont We will disc further at her upcoming f/u

## 2018-07-14 ENCOUNTER — Encounter: Payer: Self-pay | Admitting: Family Medicine

## 2018-07-14 ENCOUNTER — Ambulatory Visit (INDEPENDENT_AMBULATORY_CARE_PROVIDER_SITE_OTHER): Payer: Medicare Other | Admitting: Family Medicine

## 2018-07-14 VITALS — BP 134/80 | HR 76 | Temp 97.9°F | Ht 61.0 in | Wt 147.5 lb

## 2018-07-14 DIAGNOSIS — R1310 Dysphagia, unspecified: Secondary | ICD-10-CM | POA: Insufficient documentation

## 2018-07-14 DIAGNOSIS — Z0000 Encounter for general adult medical examination without abnormal findings: Secondary | ICD-10-CM

## 2018-07-14 DIAGNOSIS — I10 Essential (primary) hypertension: Secondary | ICD-10-CM | POA: Diagnosis not present

## 2018-07-14 DIAGNOSIS — C8300 Small cell B-cell lymphoma, unspecified site: Secondary | ICD-10-CM | POA: Diagnosis not present

## 2018-07-14 DIAGNOSIS — R1010 Upper abdominal pain, unspecified: Secondary | ICD-10-CM | POA: Diagnosis not present

## 2018-07-14 DIAGNOSIS — R059 Cough, unspecified: Secondary | ICD-10-CM

## 2018-07-14 DIAGNOSIS — R05 Cough: Secondary | ICD-10-CM

## 2018-07-14 DIAGNOSIS — E782 Mixed hyperlipidemia: Secondary | ICD-10-CM

## 2018-07-14 DIAGNOSIS — R1319 Other dysphagia: Secondary | ICD-10-CM

## 2018-07-14 MED ORDER — LISINOPRIL 10 MG PO TABS: 5.0000 mg | ORAL_TABLET | Freq: Every day | ORAL | 11 refills | Status: DC

## 2018-07-14 MED ORDER — AMLODIPINE BESYLATE 10 MG PO TABS: 10.0000 mg | ORAL_TABLET | Freq: Every day | ORAL | 11 refills | Status: DC

## 2018-07-14 NOTE — Assessment & Plan Note (Signed)
Reviewed health habits including diet and exercise and skin cancer prevention Reviewed appropriate screening tests for age  Also reviewed health mt list, fam hx and immunization status , as well as social and family history   Ref made to GI for dysphagia  Getting over flu- will plan flu vaccine later She also wants to put off dexa until clinically improved  Following cbc in setting of lymphoma  Disc shingrix vaccine in the future  Given materials to work on adv directive- also family  Enc good self care

## 2018-07-14 NOTE — Assessment & Plan Note (Signed)
bp in fair control at this time  BP Readings from Last 1 Encounters:  07/14/18 134/80   No changes needed Most recent labs reviewed  Disc lifstyle change with low sodium diet and exercise

## 2018-07-14 NOTE — Progress Notes (Signed)
Subjective:    Patient ID: Erica Carlson, female    DOB: [birth year], 73 y.o.   MRN: [id number]  HPI Here for medicare wellness visit as well as health mt and rev of chronic health problems   I have personally reviewed the Medicare Annual Wellness questionnaire and have noted 1. The patient's medical and social history 2. Their use of alcohol, tobacco or illicit drugs 3. Their current medications and supplements 4. The patient's functional ability including ADL's, fall risks, home safety risks and hearing or visual             impairment. 5. Diet and physical activities 6. Evidence for depression or mood disorders  The patients weight, height, BMI have been recorded in the chart and visual acuity is per eye clinic.  I have made referrals, counseling and provided education to the patient based review of the above and I have provided the pt with a written personalized care plan for preventive services. Reviewed and updated provider list, see scanned forms.  See scanned forms.  Routine anticipatory guidance given to patient.  See health maintenance. Colon cancer screening -colonoscopy 10/19 (neg) , no recall due to age Breast cancer screening  Mammogram 4/19  Self breast exam- no lumps  Sister died from breast cancer  Flu vaccine-declines   (recently had influenza and treated with tamiflu)- neg cxr Tetanus vaccine 2/16 Pneumovax completed both  Zoster vaccine-(has had shingles)  Bone density screening -not ready/ she does not feel well enough  Advance directive-not done yet Cognitive function addressed- see scanned forms- and if abnormal then additional documentation follows.  No problems with memory   PMH and SH reviewed  Meds, vitals, and allergies reviewed.   ROS: See HPI.  Otherwise negative.     Hearing Screening   125Hz  250Hz  500Hz  1000Hz  2000Hz  3000Hz  4000Hz  6000Hz  8000Hz   Right ear:   40 40 40  40    Left ear:   40 40 40  40      Visual Acuity Screening   Right  eye Left eye Both eyes  Without correction:     With correction: 20/30 20/40 20/40   she is due for an eye exam  Needs a new eye doctor  Will schedule that herself   Wt Readings from Last 3 Encounters:  07/14/18 147 lb 8 oz (66.9 kg)  06/23/18 158 lb 8 oz (71.9 kg)  06/16/18 156 lb 4 oz (70.9 kg)  has lost some weight  27.87 kg/m   Hyperlipidemia Lab Results  Component Value Date   CHOL 266 (H) 07/10/2018   CHOL 242 (H) 09/06/2017   CHOL 282 (H) 07/01/2017   Lab Results  Component Value Date   HDL 48.10 07/10/2018   HDL 60 09/06/2017   HDL 49.70 07/01/2017   Lab Results  Component Value Date   LDLCALC 189 (H) 07/10/2018   LDLCALC 163 (H) 09/06/2017   LDLCALC 204 (H) 07/01/2017   Lab Results  Component Value Date   TRIG 142.0 07/10/2018   TRIG 95 09/06/2017   TRIG 141.0 07/01/2017   Lab Results  Component Value Date   CHOLHDL 6 07/10/2018   CHOLHDL 4.0 09/06/2017   CHOLHDL 6 07/01/2017   Lab Results  Component Value Date   LDLDIRECT 204.9 02/23/2014   LDLDIRECT 249.3 04/23/2012   LDLDIRECT 217.7 12/11/2010   Intol of statins  Did ref to cardiology in the past (she did go) - did not qualify for pcyk9 Tried a different statin- rosuvastatin (  did well but stopped it recently because of problems swallowing pills)  H/o lymphoma  Oncology --seeb 8/19  Stage IIA disease Lab Results  Component Value Date   WBC 22.1 Repeated and verified X2. (HH) 07/10/2018   HGB 11.4 (L) 07/10/2018   HCT 36.5 07/10/2018   MCV 85.6 07/10/2018   PLT 239.0 07/10/2018   wbc up more than usual- but she had recently had steroids from [facility]    recent abd pain  Dosed pepcid== inc 40 bid no imp  Then some swallowing issues   Lab Results  Component Value Date   CREATININE 1.21 (H) 07/10/2018   BUN 14 07/10/2018   NA 142 07/10/2018   K 4.0 07/10/2018   CL 107 07/10/2018   CO2 30 07/10/2018   Lab Results  Component Value Date   ALT 10 07/10/2018   AST 10 07/10/2018    ALKPHOS 42 07/10/2018   BILITOT 0.4 07/10/2018     Patient Active Problem List   Diagnosis Date Noted  . Dysphagia 07/14/2018  . Cough 06/16/2018  . Abdominal pain 06/11/2018  . Need for hepatitis C screening test 06/26/2016  . 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 Encounter for Medicare annual wellness exam 09/30/2013  . Estrogen deficiency 09/17/2012  . Special screening for malignant neoplasms, colon 12/15/2010  . Other screening mammogram 12/15/2010  . Routine general medical examination at a health care facility 12/08/2010  . Low back pain 09/06/2010  . OSTEOARTHRITIS, KNEE, RIGHT 10/11/2008  . Internal hemorrhoids 09/02/2008  . Hypertension 04/28/2008  . PNEUMONIA, HX OF 03/03/2008  . Hyperlipidemia 02/26/2008   Past Medical History:  Diagnosis Date  . Allergy    spring  . Arthritis    osteo arthritis of right knee  . Cataract    forming  . Hyperlipidemia   . Hypertension   . 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 Pneumonia    Past Surgical History:  Procedure Laterality Date  . ABDOMINAL HYSTERECTOMY    . AXILLARY LYMPH NODE BIOPSY Right 02/07/2016  . back injections    . BREAST SURGERY    . COLONOSCOPY  2009  . EYE SURGERY     retinal tear repair  . KNEE SURGERY     torn ALC per pt   Social History   Tobacco Use  . Smoking status: Never Smoker  . Smokeless tobacco: Never Used  Substance Use Topics  . Alcohol use: No    Alcohol/week: 0.0 standard drinks  . Drug use: No   Family History  Problem Relation Age of Onset  . Breast cancer Sister   . Hyperlipidemia Son   . Schizophrenia Mother   . Colon cancer Neg Hx   . Colon polyps Neg Hx   . Esophageal cancer Neg Hx   . Rectal cancer Neg Hx   . Stomach cancer Neg Hx    Allergies  Allergen Reactions  . Hctz [Hydrochlorothiazide] Nausea And Vomiting    Nausea and vomiting   . Lipitor [Atorvastatin] Other (See Comments)    Ache in back and legs   Current Outpatient  Medications on File Prior to Visit  Medication Sig Dispense Refill  . Cholecalciferol (VITAMIN D-3) 5000 units TABS Take 1 tablet by mouth daily.    . famotidine (PEPCID) 20 MG tablet Take 1 tablet (20 mg total) by mouth 2 (two) times daily. 60 tablet 3  . methocarbamol (ROBAXIN) 750 MG tablet Take 1 tablet (750 mg total) by  mouth 3 (three) times daily as needed for muscle spasms. Caution of sedation 30 tablet 1  . Multiple Vitamins-Minerals (PRESERVISION AREDS 2 PO) Take by mouth.    . predniSONE (DELTASONE) 20 MG tablet 2 tabs po for 5 days, then 1 tab po for 5 days 15 tablet 0  . rosuvastatin (CRESTOR) 10 MG tablet Take 1 tablet (10 mg total) by mouth daily. 90 tablet 3   No current facility-administered medications on file prior to visit.     Review of Systems  Constitutional: Positive for fatigue. Negative for activity change, appetite change, chills, fever and unexpected weight change.  HENT: Positive for postnasal drip and sinus pressure. Negative for congestion, ear pain, rhinorrhea, sinus pain and sore throat.   Eyes: Negative for pain, redness and visual disturbance.  Respiratory: Negative for cough, shortness of breath and wheezing.        Cough is much improved   Cardiovascular: Negative for chest pain and palpitations.  Gastrointestinal: Positive for abdominal pain. Negative for abdominal distention, anal bleeding, blood in stool, constipation, diarrhea, nausea and rectal pain.       Food gets stuck in esophagus (also pills)  Endocrine: Negative for polydipsia and polyuria.  Genitourinary: Negative for dysuria, frequency and urgency.  Musculoskeletal: Positive for back pain. Negative for arthralgias and myalgias.  Skin: Negative for pallor and rash.  Allergic/Immunologic: Negative for environmental allergies.  Neurological: Negative for dizziness, syncope, facial asymmetry, light-headedness and headaches.  Hematological: Negative for adenopathy. Does not bruise/bleed easily.    Psychiatric/Behavioral: Negative for decreased concentration and dysphoric mood. The patient is not nervous/anxious.        Objective:   Physical Exam Constitutional:      General: She is not in acute distress.    Appearance: Normal appearance. She is well-developed and normal weight. She is not ill-appearing.  HENT:     Head: Normocephalic and atraumatic.     Right Ear: Tympanic membrane, ear canal and external ear normal.     Left Ear: Tympanic membrane, ear canal and external ear normal.     Nose: Nose normal.     Mouth/Throat:     Mouth: Mucous membranes are moist.     Pharynx: Oropharynx is clear. No posterior oropharyngeal erythema.  Eyes:     General: No scleral icterus.    Conjunctiva/sclera: Conjunctivae normal.     Pupils: Pupils are equal, round, and reactive to light.  Neck:     Musculoskeletal: Normal range of motion and neck supple.     Thyroid: No thyromegaly.     Vascular: No carotid bruit or JVD.  Cardiovascular:     Rate and Rhythm: Normal rate and regular rhythm.     Heart sounds: Normal heart sounds. No gallop.   Pulmonary:     Effort: Pulmonary effort is normal. No respiratory distress.     Breath sounds: Normal breath sounds. No stridor. No wheezing, rhonchi or rales.  Chest:     Chest wall: No tenderness.  Abdominal:     General: Bowel sounds are normal. There is no distension or abdominal bruit.     Palpations: Abdomen is soft. There is no mass.     Tenderness: There is abdominal tenderness in the epigastric area. Negative signs include Murphy's sign and McBurney's sign.     Comments: Mild epigastric tenderness  Genitourinary:    Comments: Breast exam: No mass, nodules, thickening, tenderness, bulging, retraction, inflamation, nipple discharge or skin changes noted.  No axillary or clavicular LA.  Musculoskeletal: Normal range of motion.        General: No tenderness or deformity.     Comments: Limited rom LS  Lymphadenopathy:     Head:      Right side of head: No posterior auricular adenopathy.     Left side of head: No posterior auricular adenopathy.     Cervical: Cervical adenopathy present.     Right cervical: Superficial cervical adenopathy present.     Left cervical: Superficial cervical adenopathy present.     Upper Body:     Right upper body: Supraclavicular adenopathy and axillary adenopathy present.     Left upper body: Supraclavicular adenopathy and axillary adenopathy present.     Lower Body: No right inguinal adenopathy.     Comments: Per pt -baseline/ with lymphoma   Skin:    General: Skin is warm and dry.     Capillary Refill: Capillary refill takes less than 2 seconds.     Coloration: Skin is not pale.     Findings: No erythema or rash.  Neurological:     General: No focal deficit present.     Mental Status: She is alert. Mental status is at baseline.     Cranial Nerves: No cranial nerve deficit.     Motor: No abnormal muscle tone.     Coordination: Coordination normal.     Deep Tendon Reflexes: Reflexes are normal and symmetric. Reflexes normal.  Psychiatric:        Mood and Affect: Mood normal.           Assessment & Plan:   Problem List Items Addressed This Visit      Cardiovascular and Mediastinum   Hypertension    bp in fair control at this time  BP Readings from Last 1 Encounters:  07/14/18 134/80   No changes needed Most recent labs reviewed  Disc lifstyle change with low sodium diet and exercise        Relevant Medications   amLODipine (NORVASC) 10 MG tablet   lisinopril (PRINIVIL,ZESTRIL) 10 MG tablet     Digestive   Dysphagia    With feeling of burning in esophagus (in lymphoma pt)  Unable to get pills down and has to eat carefully  No imp with pepcid high dose Ref to GI        Relevant Orders   Ambulatory referral to Gastroenterology     Other   Routine general medical examination at a health care facility - Primary    Reviewed health habits including diet and  exercise and skin cancer prevention Reviewed appropriate screening tests for age  Also reviewed health mt list, fam hx and immunization status , as well as social and family history   Ref made to GI for dysphagia  Getting over flu- will plan flu vaccine later She also wants to put off dexa until clinically improved  Following cbc in setting of lymphoma  Disc shingrix vaccine in the future  Given materials to work on adv directive- also family  Enc good self care      Lymphoma, small lymphocytic ([place])    Good report with oncology in august Recently however wbc spiked to 22.1  This was during resp illness and also shortly after a prednisone course for back pain  Suspect this was reactive  Feeling better Re check in 1 mo  If no improvement will return to oncology (as of now seeing yearly)       Hyperlipidemia    Prev  doing well on low dose crestor from lipid clinic- but had to stop it when she developed problems swallowing pills  LDL is up as expected  Will plan to re check when she is back on medicine Disc goals for lipids and reasons to control them Rev last labs with pt Rev low sat fat diet in detail       Relevant Medications   amLODipine (NORVASC) 10 MG tablet   lisinopril (PRINIVIL,ZESTRIL) 10 MG tablet   Cough    Recent influenza -much improvement clinically  She declines flu vaccine for a few more weeks Reassuring exam       Abdominal pain    Epigastric pain - no imp with high dose of pepcid  Also now dysphagia  ? Stricture or other (is lymphoma pt as well) Ref to GI       Relevant Orders   Ambulatory referral to Gastroenterology

## 2018-07-14 NOTE — Patient Instructions (Addendum)
When you are ready for a flu shot -please call us   Also call us when you are ready to get a bone density test   If you are interested in the new shingles vaccine (Shingrix) - call your local pharmacy to check on coverage and availability  If affordable, get on a wait list at your pharmacy to get the vaccine.  Please look at the blue booklet regarding advance directive  When done - get [place] a copy for your chart   Get back on the cholesterol medicine (generic crestor) when you are able to swallow it   We will re check cbc (blood count) in 3-4 weeks   Take care of yourself   We will refer you to GI for the swallowing issue

## 2018-07-14 NOTE — Assessment & Plan Note (Signed)
Epigastric pain - no imp with high dose of pepcid  Also now dysphagia  ? Stricture or other (is lymphoma pt as well) Ref to GI

## 2018-07-14 NOTE — Assessment & Plan Note (Signed)
With feeling of burning in esophagus (in lymphoma pt)  Unable to get pills down and has to eat carefully  No imp with pepcid high dose Ref to GI

## 2018-07-14 NOTE — Assessment & Plan Note (Signed)
Good report with oncology in august Recently however wbc spiked to 22.1  This was during resp illness and also shortly after a prednisone course for back pain  Suspect this was reactive  Feeling better Re check in 1 mo  If no improvement will return to oncology (as of now seeing yearly)

## 2018-07-14 NOTE — Assessment & Plan Note (Signed)
Recent influenza -much improvement clinically  She declines flu vaccine for a few more weeks Reassuring exam

## 2018-07-14 NOTE — Assessment & Plan Note (Signed)
Prev doing well on low dose crestor from lipid clinic- but had to stop it when she developed problems swallowing pills  LDL is up as expected  Will plan to re check when she is back on medicine Disc goals for lipids and reasons to control them Rev last labs with pt Rev low sat fat diet in detail

## 2018-07-16 ENCOUNTER — Ambulatory Visit: Payer: Medicare Other | Admitting: Gastroenterology

## 2018-07-16 ENCOUNTER — Encounter: Payer: Self-pay | Admitting: Gastroenterology

## 2018-07-16 VITALS — BP 140/80 | HR 76 | Ht 61.0 in | Wt 149.4 lb

## 2018-07-16 DIAGNOSIS — R634 Abnormal weight loss: Secondary | ICD-10-CM | POA: Diagnosis not present

## 2018-07-16 DIAGNOSIS — K219 Gastro-esophageal reflux disease without esophagitis: Secondary | ICD-10-CM

## 2018-07-16 DIAGNOSIS — R1310 Dysphagia, unspecified: Secondary | ICD-10-CM

## 2018-07-16 DIAGNOSIS — C8300 Small cell B-cell lymphoma, unspecified site: Secondary | ICD-10-CM

## 2018-07-16 DIAGNOSIS — R630 Anorexia: Secondary | ICD-10-CM | POA: Diagnosis not present

## 2018-07-16 MED ORDER — OMEPRAZOLE 40 MG PO CPDR: 40.0000 mg | DELAYED_RELEASE_CAPSULE | Freq: Two times a day (BID) | ORAL | 3 refills | Status: DC

## 2018-07-16 NOTE — Progress Notes (Signed)
Erica Carlson    [id number]    1946/02/22  Primary Care Physician:Tower, Wynelle Fanny, MD  Referring Physician: Tower, Wynelle Fanny, MD [address] [place], [place] [address]  Chief complaint: Dysphagia, atypical chest pain, epigastric abdominal pain, nausea, weight loss HPI: 73 year old female with history of small cell lymphocytic lymphoma hypertension, hyperlipidemia here with complaints of dysphagia worse in the past 2 months since Thanksgiving.  She had flu around Thanksgiving and since then she has nausea, difficulty swallowing with mostly solids but also sometimes with thick liquids and sensation of fullness and choking in the throat.  Occasionally she has to spit it out if she feels the lump in the throat and cannot get the food to pass down.  She has lost 12 to 15 pounds in the past 2 months.  She started taking over-the-counter Prilosec 2 days ago and feels she is able to tolerate drinking liquids better but still having difficulty with solids.  Denies any change in bowel habits.  Has history of chronic constipation which has improved with Benefiber.  Denies any melena or blood per rectum. She is only tolerating soft foods and liquids at this point.  Can eat rice or boiled egg.  Cannot eat any vegetables meat or bread.  Colonoscopy April 14, 2018 showed diverticulosis in sigmoid and ascending colon, internal hemorrhoids otherwise unremarkable exam. Status post hemorrhoidal band [id number]  Outpatient Encounter Medications as of 07/16/2018  Medication Sig  . amLODipine (NORVASC) 10 MG tablet Take 1 tablet (10 mg total) by mouth daily.  . Cholecalciferol (VITAMIN D-3) 5000 units TABS Take 1 tablet by mouth daily.  . famotidine (PEPCID) 20 MG tablet Take 1 tablet (20 mg total) by mouth 2 (two) times daily.  Marland Kitchen lisinopril (PRINIVIL,ZESTRIL) 10 MG tablet Take 0.5 tablets (5 mg total) by mouth daily.  . methocarbamol (ROBAXIN) 750 MG tablet Take 1 tablet (750 mg total) by  mouth 3 (three) times daily as needed for muscle spasms. Caution of sedation  . Multiple Vitamins-Minerals (PRESERVISION AREDS 2 PO) Take by mouth.  . rosuvastatin (CRESTOR) 10 MG tablet Take 1 tablet (10 mg total) by mouth daily.  . [DISCONTINUED] predniSONE (DELTASONE) 20 MG tablet 2 tabs po for 5 days, then 1 tab po for 5 days   No facility-administered encounter medications on file as of 07/16/2018.     Allergies as of 07/16/2018 - Review Complete 07/16/2018  Allergen Reaction Noted  . Hctz [hydrochlorothiazide] Nausea And Vomiting 11/15/2014  . Lipitor [atorvastatin] Other (See Comments) 11/04/2013    Past Medical History:  Diagnosis Date  . Allergy    spring  . Arthritis    osteo arthritis of right knee  . Cataract    forming  . Hyperlipidemia   . Hypertension   . 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 Pneumonia     Past Surgical History:  Procedure Laterality Date  . ABDOMINAL HYSTERECTOMY    . AXILLARY LYMPH NODE BIOPSY Right 02/07/2016  . back injections    . BREAST SURGERY    . COLONOSCOPY  2009  . EYE SURGERY     retinal tear repair  . KNEE SURGERY     torn [facility]C per pt    Family History  Problem Relation Age of Onset  . Breast cancer Sister   . Hyperlipidemia Son   . Schizophrenia Mother   . Colon cancer Neg Hx   . Colon  polyps Neg Hx   . Esophageal cancer Neg Hx   . Rectal cancer Neg Hx   . Stomach cancer Neg Hx     Social History   Socioeconomic History  . Marital status: Widowed    Spouse name: n/a  . Number of children: 6  . Years of education: 51  . Highest education level: Not on file  Occupational History  . 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 Financial resource strain: Not on file  . Food insecurity:    Worry: Not on file    Inability: Not on file  . Transportation needs:    Medical: Not on file    Non-medical: Not on file  Tobacco Use  .  Smoking status: Never Smoker  . Smokeless tobacco: Never Used  Substance and Sexual Activity  . Alcohol use: No    Alcohol/week: 0.0 standard drinks  . Drug use: No  . Sexual activity: Never  Lifestyle  . Physical activity:    Days per week: Not on file    Minutes per session: Not on file  . Stress: Not on file  Relationships  . Social connections:    Talks on phone: Not on file    Gets together: Not on file    Attends religious service: Not on file    Active member of club or organization: Not on file    Attends meetings of clubs or organizations: Not on file    Relationship status: Not on file  . Intimate partner violence:    Fear of current or ex partner: Not on file    Emotionally abused: Not on file    Physically abused: Not on file    Forced sexual activity: Not on file  Other Topics Concern  . Not on file  Social History Narrative   Lives alone.   Her adult children live nearby.      Review of systems: Review of Systems  Constitutional: Negative for fever and chills.  Positive for weight loss and loss of appetite HENT: Negative.   Eyes: Negative for blurred vision.  Respiratory: Negative for cough, shortness of breath and wheezing.   Cardiovascular: Negative for chest pain and palpitations.  Gastrointestinal: as per HPI Genitourinary: Negative for dysuria, urgency, frequency and hematuria.  Musculoskeletal: Positive for myalgias, back pain and joint pain.  Skin: Negative for itching and rash.  Neurological: Negative for dizziness, tremors, focal weakness, seizures and loss of consciousness.  Endo/Heme/Allergies: Negative for seasonal allergies.  Psychiatric/Behavioral: Negative for depression, suicidal ideas and hallucinations.  All other systems reviewed and are negative.   Physical Exam: Vitals:   07/16/18 1019  BP: 140/80  Pulse: 76   Body mass index is 28.22 kg/m. Gen:      No acute distress HEENT:  EOMI, sclera anicteric Neck:     No masses; no  thyromegaly Lungs:    Clear to auscultation bilaterally; normal respiratory effort CV:         Regular rate and rhythm; no murmurs Abd:      + bowel sounds; soft, non-tender; no palpable masses, no distension Ext:    No edema; adequate peripheral perfusion Skin:      Warm and dry; no rash Neuro: alert and oriented x 3 Psych: normal mood and affect  Data Reviewed:  Reviewed labs, radiology imaging, old records and pertinent past GI work up   Assessment and Plan/Recommendations:  73 year old  female with history of hypertension, hyperlipidemia, recently diagnosed small lymphocytic lymphoma here with complaints of worsening dysphagia in the past 2 months We will schedule for EGD for further evaluation, possible esophageal biopsies and dilation if needed Differential includes severe esophagitis, peptic stricture, eosinophilic esophagitis, will need to exclude neoplastic lesion.  If no mucosal abnormality, will need to consider imaging to exclude mediastinal lymph node enlargement given history of small lymphocytic lymphoma Advised patient to continue with soft foods with high calorie and protein intake Small frequent meals Start omeprazole 40 mg twice daily Antireflux measures and lifestyle modifications  The risks and benefits as well as alternatives of endoscopic procedure(s) have been discussed and reviewed. All questions answered. The patient agrees to proceed.     Damaris Hippo , MD [phone]    CC: Tower, Wynelle Fanny, MD

## 2018-07-16 NOTE — Patient Instructions (Signed)
You have been scheduled for an endoscopy. Please follow written instructions given to you at your visit today. If you use inhalers (even only as needed), please bring them with you on the day of your procedure.   We have sent omeprazole to your pharmacy  Eat small frequent meals   High-Protein  Eating high-protein and high-calorie foods can help you to gain weight, heal after an injury, and recover after an illness or surgery. The specific amount of daily protein and calories you need depends on:  Your body weight.  The reason this diet is recommended for you. What is my plan? Generally, a high-protein, high-calorie diet involves:  Eating 250-500 extra calories each day.  Making sure that you get enough of your daily calories from protein. Ask your health care provider how many of your calories should come from protein. Talk with a health care provider, such as a diet and nutrition specialist (dietitian), about how much protein and how many calories you need each day. Follow the diet as directed by your health care provider. What are tips for following this plan?  Preparing meals  Add whole milk, half-and-half, or heavy cream to cereal, pudding, soup, or hot cocoa.  Add whole milk to instant breakfast drinks.  Add peanut butter to oatmeal or smoothies.  Add powdered milk to baked goods, smoothies, or milkshakes.  Add powdered milk, cream, or butter to mashed potatoes.  Add cheese to cooked vegetables.  Make whole-milk yogurt parfaits. Top them with granola, fruit, or nuts.  Add cottage cheese to your fruit.  Add avocado, cheese, or both to sandwiches or salads.  Add meat, poultry, or seafood to rice, pasta, casseroles, salads, and soups.  Use mayonnaise when making egg salad, chicken salad, or tuna salad.  Use peanut butter as a dip for vegetables or as a topping for pretzels, celery, or crackers.  Add beans to casseroles, dips, and spreads.  Add pureed beans to  sauces and soups.  Replace calorie-free drinks with calorie-containing drinks, such as milk and fruit juice.  Replace water with milk or heavy cream when making foods such as oatmeal, pudding, or cocoa. General instructions  Ask your health care provider if you should take a nutritional supplement.  Try to eat six small meals each day instead of three large meals.  Eat a balanced diet. In each meal, include one food that is high in protein.  Keep nutritious snacks available, such as nuts, trail mixes, dried fruit, and yogurt.  If you have kidney disease or diabetes, talk with your health care provider about how much protein is safe for you. Too much protein may put extra stress on your kidneys.  Drink your calories. Choose high-calorie drinks and have them after your meals. What high-protein foods should I eat?  Vegetables Soybeans. Peas. Grains Quinoa. Bulgur wheat. Meats and other proteins Beef, pork, and poultry. Fish and seafood. Eggs. Tofu. Textured vegetable protein (TVP). Peanut butter. Nuts and seeds. Dried beans. Protein powders. Dairy Whole milk. Whole-milk yogurt. Powdered milk. Cheese. Yahoo. Eggnog. Beverages High-protein supplement drinks. Soy milk. Other foods Protein bars. The items listed above may not be a complete list of high-protein foods and beverages. Contact a dietitian for more options. What high-calorie foods should I eat? Fruits Dried fruit. Fruit leather. Canned fruit in syrup. Fruit juice. Avocado. Vegetables Vegetables cooked in oil or butter. Fried potatoes. Grains Pasta. Quick breads. Muffins. Pancakes. Ready-to-eat cereal. Meats and other proteins Peanut butter. Nuts and seeds. Dairy Heavy  cream. Whipped cream. Cream cheese. Sour cream. Ice cream. Custard. Pudding. Beverages Meal-replacement beverages. Nutrition shakes. Fruit juice. Sugar-sweetened soft drinks. Seasonings and condiments Salad dressing. Mayonnaise. Alfredo sauce.  Fruit preserves or jelly. Honey. Syrup. Sweets and desserts Cake. Cookies. Pie. Pastries. Candy bars. Chocolate. Fats and oils Butter or margarine. Oil. Gravy. Other foods Meal-replacement bars. The items listed above may not be a complete list of high-calorie foods and beverages. Contact a dietitian for more options. Summary  A high-protein, high-calorie diet can help you gain weight or heal faster after an injury, illness, or surgery.  To increase your protein and calories, add ingredients such as whole milk, peanut butter, cheese, beans, meat, or seafood to meal items.  To get enough extra calories each day, include high-calorie foods and beverages at each meal.  Adding a high-calorie drink or shake can be an easy way to help you get enough calories each day. Talk with your healthcare provider or dietitian about the best options for you. This information is not intended to replace advice given to you by your health care provider. Make sure you discuss any questions you have with your health care provider. Document Released: 06/25/2005 Document Revised: 05/07/2017 Document Reviewed: 05/07/2017 Elsevier Interactive Patient Education  2019 [employer].

## 2018-07-18 ENCOUNTER — Ambulatory Visit (AMBULATORY_SURGERY_CENTER): Payer: Medicare Other | Admitting: Gastroenterology

## 2018-07-18 ENCOUNTER — Telehealth: Payer: Self-pay | Admitting: *Deleted

## 2018-07-18 ENCOUNTER — Other Ambulatory Visit: Payer: Self-pay | Admitting: *Deleted

## 2018-07-18 ENCOUNTER — Encounter: Payer: Self-pay | Admitting: Gastroenterology

## 2018-07-18 VITALS — BP 145/69 | HR 69 | Temp 98.6°F | Resp 13

## 2018-07-18 DIAGNOSIS — K449 Diaphragmatic hernia without obstruction or gangrene: Secondary | ICD-10-CM | POA: Diagnosis not present

## 2018-07-18 DIAGNOSIS — R1310 Dysphagia, unspecified: Secondary | ICD-10-CM

## 2018-07-18 DIAGNOSIS — R634 Abnormal weight loss: Secondary | ICD-10-CM

## 2018-07-18 DIAGNOSIS — R630 Anorexia: Secondary | ICD-10-CM

## 2018-07-18 MED ORDER — SODIUM CHLORIDE 0.9 % IV SOLN: 500.0000 mL | Freq: Once | INTRAVENOUS | Status: DC

## 2018-07-18 NOTE — Op Note (Signed)
[facility] Patient Name: Erica Carlson Procedure Date: 07/18/2018 10:40 AM MRN: [id number] Endoscopist: Mauri Pole , MD Age: 73 Referring MD:  Date of Birth: 12-Jan-1946 Gender: Female Account #: [id number] Procedure:                Upper GI endoscopy Indications:              Dysphagia Medicines:                Monitored Anesthesia Care Procedure:                Pre-Anesthesia Assessment:                           - Prior to the procedure, a History and Physical                            was performed, and patient medications and                            allergies were reviewed. The patient's tolerance of                            previous anesthesia was also reviewed. The risks                            and benefits of the procedure and the sedation                            options and risks were discussed with the patient.                            All questions were answered, and informed consent                            was obtained. Prior Anticoagulants: The patient has                            taken no previous anticoagulant or antiplatelet                            agents. ASA Grade Assessment: II - A patient with                            mild systemic disease. After reviewing the risks                            and benefits, the patient was deemed in                            satisfactory condition to undergo the procedure.                           After obtaining informed consent, the endoscope was  passed under direct vision. Throughout the                            procedure, the patient's blood pressure, pulse, and                            oxygen saturations were monitored continuously. The                            Endoscope was introduced through the mouth, and                            advanced to the second part of duodenum. The upper                            GI endoscopy was accomplished without  difficulty.                            The patient tolerated the procedure well. Scope In: Scope Out: Findings:                 The Z-line was regular and was found 35 cm from the                            incisors.                           A 5 cm hiatal hernia was present.                           No endoscopic abnormality was evident in the                            esophagus to explain the patient's complaint of                            dysphagia. Biopsies were obtained from the proximal                            and distal esophagus with cold forceps for                            histology of suspected eosinophilic esophagitis.                           The stomach was normal.                           The cardia and gastric fundus were normal on                            retroflexion.                           The examined duodenum was normal. Complications:  No immediate complications. Estimated Blood Loss:     Estimated blood loss was minimal. Impression:               - Z-line regular, 35 cm from the incisors.                           - 5 cm hiatal hernia.                           - No endoscopic esophageal abnormality to explain                            patient's dysphagia. Biopsied.                           - Normal stomach.                           - Normal examined duodenum. Recommendation:           - Patient has a contact number available for                            emergencies. The signs and symptoms of potential                            delayed complications were discussed with the                            patient. Return to normal activities tomorrow.                            Written discharge instructions were provided to the                            patient.                           - Resume previous diet.                           - Continue present medications.                           - Await pathology results.                            - CT chest with contrast to exclude mediastinal                            lymphadenopathy Mauri Pole, MD 07/18/2018 11:24:09 AM This report has been signed electronically.

## 2018-07-18 NOTE — Patient Instructions (Signed)
YOU HAD AN ENDOSCOPIC PROCEDURE TODAY AT [facility]:   Refer to the procedure report that was given to you for any specific questions about what was found during the examination.  If the procedure report does not answer your questions, please call your gastroenterologist to clarify.  If you requested that your care partner not be given the details of your procedure findings, then the procedure report has been included in a sealed envelope for you to review at your convenience later.  YOU SHOULD EXPECT: Some feelings of bloating in the abdomen. Passage of more gas than usual.  Walking can help get rid of the air that was put into your GI tract during the procedure and reduce the bloating. If you had a lower endoscopy (such as a colonoscopy or flexible sigmoidoscopy) you may notice spotting of blood in your stool or on the toilet paper. If you underwent a bowel prep for your procedure, you may not have a normal bowel movement for a few days.  Please Note:  You might notice some irritation and congestion in your nose or some drainage.  This is from the oxygen used during your procedure.  There is no need for concern and it should clear up in a day or so.  SYMPTOMS TO REPORT IMMEDIATELY:   Following upper endoscopy (EGD)  Vomiting of blood or coffee ground material  New chest pain or pain under the shoulder blades  Painful or persistently difficult swallowing  New shortness of breath  Fever of 100F or higher  Black, tarry-looking stools  For urgent or emergent issues, a gastroenterologist can be reached at any hour by calling [phone].   DIET:  We do recommend a small meal at first, but then you may proceed to your regular diet.  Drink plenty of fluids but you should avoid alcoholic beverages for 24 hours.  MEDICATIONS: Continue present medications.  Please see handouts given to you by your recovery nurse.  FOLLOW UP: CT of chest with contrast to exclude mediastinal  lympadenopathy. Dr. Woodward Ku office will call you on Monday to schedule this appointment. 2 bottles of oral contrast given to patient.  ACTIVITY:  You should plan to take it easy for the rest of today and you should NOT DRIVE or use heavy machinery until tomorrow (because of the sedation medicines used during the test).    FOLLOW UP: Our staff will call the number listed on your records the next business day following your procedure to check on you and address any questions or concerns that you may have regarding the information given to you following your procedure. If we do not reach you, we will leave a message.  However, if you are feeling well and you are not experiencing any problems, there is no need to return our call.  We will assume that you have returned to your regular daily activities without incident.  If any biopsies were taken you will be contacted by phone or by letter within the next 1-3 weeks.  Please call us at [phone] if you have not heard about the biopsies in 3 weeks.   Thank you for allowing [place] to provide for your healthcare needs today.  SIGNATURES/CONFIDENTIALITY: You and/or your care partner have signed paperwork which will be entered into your electronic medical record.  These signatures attest to the fact that that the information above on your After Visit Summary has been reviewed and is understood.  Full responsibility of the confidentiality of this  discharge information lies with you and/or your care-partner. 

## 2018-07-18 NOTE — Progress Notes (Signed)
Pt's states no medical or surgical changes since previsit or office visit. 

## 2018-07-18 NOTE — Progress Notes (Signed)
To PACU, VSS. Report to [email or web address] 

## 2018-07-18 NOTE — Telephone Encounter (Signed)
CT of Chest scheduled on 07/24/2018 at 4pm NPO two hours prior to test  Called patient to inform  Date and time and location of test

## 2018-07-18 NOTE — Progress Notes (Signed)
Called to room to assist during endoscopic procedure.  Patient ID and intended procedure confirmed with present staff. Received instructions for my participation in the procedure from the performing physician.  

## 2018-07-21 ENCOUNTER — Telehealth: Payer: Self-pay | Admitting: *Deleted

## 2018-07-21 ENCOUNTER — Telehealth: Payer: Self-pay

## 2018-07-21 NOTE — Telephone Encounter (Signed)
Attempted to reach pt. With follow-up call following endoscopic procedure 07/21/2018.  LM on pt. Voice mail to call if she has any questions or concerns.

## 2018-07-21 NOTE — Telephone Encounter (Signed)
First attempt, left VM.  

## 2018-07-24 ENCOUNTER — Ambulatory Visit (INDEPENDENT_AMBULATORY_CARE_PROVIDER_SITE_OTHER)
Admission: RE | Admit: 2018-07-24 | Discharge: 2018-07-24 | Disposition: A | Discharge status: Home / Self Care | Payer: Medicare Other | Source: Ambulatory Visit | Attending: Gastroenterology | Admitting: Gastroenterology

## 2018-07-24 DIAGNOSIS — R0789 Other chest pain: Secondary | ICD-10-CM | POA: Diagnosis not present

## 2018-07-24 DIAGNOSIS — R634 Abnormal weight loss: Secondary | ICD-10-CM | POA: Diagnosis not present

## 2018-07-24 DIAGNOSIS — R630 Anorexia: Secondary | ICD-10-CM

## 2018-07-24 MED ORDER — IOPAMIDOL (ISOVUE-300) INJECTION 61%
80.0000 mL | Freq: Once | INTRAVENOUS | Status: AC | PRN
  Administered 2018-07-24: 80 mL via INTRAVENOUS

## 2018-07-25 ENCOUNTER — Telehealth: Payer: Self-pay

## 2018-07-25 NOTE — Telephone Encounter (Signed)
Doc of the day Received a call from Radiology wanting to be certain the CT of the chest report was received and viewable by the ordering provider.  Report is in [place]. Sending a message to Dr Silverio Decamp who is out of the office today.  Can this wait for her return 07/28/2018?

## 2018-07-29 ENCOUNTER — Encounter: Payer: Self-pay | Admitting: Gastroenterology

## 2018-07-29 ENCOUNTER — Telehealth: Payer: Self-pay | Admitting: Hematology and Oncology

## 2018-07-29 NOTE — Telephone Encounter (Signed)
Called patient per 1/21 sch message - unable to reach patient - left message for pt to call back

## 2018-07-30 ENCOUNTER — Telehealth: Payer: Self-pay

## 2018-07-30 NOTE — Telephone Encounter (Signed)
Attempted to call patient to schedule follow up with provider.  No option for VM, nurse tried X 2.

## 2018-07-31 ENCOUNTER — Ambulatory Visit: Payer: Medicare Other | Admitting: Hematology and Oncology

## 2018-07-31 ENCOUNTER — Inpatient Hospital Stay: Payer: Medicare Other | Attending: Hematology and Oncology | Admitting: Hematology and Oncology

## 2018-07-31 ENCOUNTER — Inpatient Hospital Stay: Payer: Medicare Other

## 2018-07-31 DIAGNOSIS — M545 Low back pain: Secondary | ICD-10-CM | POA: Insufficient documentation

## 2018-07-31 DIAGNOSIS — C8300 Small cell B-cell lymphoma, unspecified site: Secondary | ICD-10-CM

## 2018-07-31 DIAGNOSIS — R1310 Dysphagia, unspecified: Secondary | ICD-10-CM | POA: Diagnosis not present

## 2018-07-31 LAB — CBC WITH DIFFERENTIAL (CANCER CENTER ONLY)
Abs Immature Granulocytes: 0 10*3/uL (ref 0.00–0.07)
Basophils Absolute: 0 10*3/uL (ref 0.0–0.1)
Basophils Relative: 0 %
Eosinophils Absolute: 0.2 10*3/uL (ref 0.0–0.5)
Eosinophils Relative: 1 %
HCT: 33.8 % — ABNORMAL LOW (ref 36.0–46.0)
Hemoglobin: 10.5 g/dL — ABNORMAL LOW (ref 12.0–15.0)
LYMPHS ABS: 16.9 10*3/uL — AB (ref 0.7–4.0)
Lymphocytes Relative: 87 %
MCH: 27.2 pg (ref 26.0–34.0)
MCHC: 31.1 g/dL (ref 30.0–36.0)
MCV: 87.6 fL (ref 80.0–100.0)
Monocytes Absolute: 0.4 10*3/uL (ref 0.1–1.0)
Monocytes Relative: 2 %
NRBC: 0 % (ref 0.0–0.2)
Neutro Abs: 1.9 10*3/uL (ref 1.7–17.7)
Neutrophils Relative %: 10 %
Platelet Count: 293 10*3/uL (ref 150–400)
RBC: 3.86 MIL/uL — ABNORMAL LOW (ref 3.87–5.11)
RDW: 14.3 % (ref 11.5–15.5)
WBC Count: 19.4 10*3/uL — ABNORMAL HIGH (ref 4.0–10.5)

## 2018-07-31 LAB — CMP (CANCER CENTER ONLY)
ALT: 10 U/L (ref 0–44)
AST: 11 U/L — ABNORMAL LOW (ref 15–41)
Albumin: 3.6 g/dL (ref 3.5–5.0)
Alkaline Phosphatase: 52 U/L (ref 38–126)
Anion gap: 9 (ref 5–15)
BUN: 14 mg/dL (ref 8–23)
CO2: 26 mmol/L (ref 22–32)
Calcium: 9.4 mg/dL (ref 8.9–10.3)
Chloride: 109 mmol/L (ref 98–111)
Creatinine: 1.14 mg/dL — ABNORMAL HIGH (ref 0.44–1.00)
GFR, EST AFRICAN AMERICAN: 56 mL/min — AB (ref >60)
GFR, Estimated: 48 mL/min — ABNORMAL LOW (ref >60)
Glucose, Bld: 84 mg/dL (ref 70–99)
POTASSIUM: 3.9 mmol/L (ref 3.5–5.1)
Sodium: 144 mmol/L (ref 135–145)
Total Bilirubin: <0.2 mg/dL — ABNORMAL LOW (ref 0.3–1.2)
Total Protein: 6.6 g/dL (ref 6.5–8.1)

## 2018-07-31 LAB — LACTATE DEHYDROGENASE: LDH: 246 U/L — ABNORMAL HIGH (ref 98–192)

## 2018-07-31 NOTE — Progress Notes (Signed)
Patient Care Team: Tower, Wynelle Fanny, MD as PCP - General (Family Medicine) Nicholas Lose, MD as Consulting Physician (Hematology and Oncology) Galvin Proffer, OD as Consulting Physician (Optometry)  DIAGNOSIS:    ICD-10-CM   1. 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 with above-mentioned history of SLL who is currently on observation. A chest CT was taken on 07/24/18 after she reported chest pressure after eating and losing 14lbs unintentionally and showed bulky mediastinal, hilar, subpectoral, axillary, and upper abdominal lymphadenopathy. She presents to the clinic today with her daughter to discuss recent CT results. She notes indigestion, difficulty swallowing, and chest discomfort after eating which has led to her losing 14lbs. She denies fevers, but reports mild night sweats. She had the flu at the beginning of December. After the flu she had lower back pain and pain radiating from her upper back to chest which they attributed with acid reflux and for which she has been taking medication. She previously had a golf ball sized enlarged node under her right arm that she reports has decreased in size. She was on prednisone for her lower back pain and has been off it for 3 weeks. Blood work from 07/10/18 showed lymphocytes at 19.2 and WBC 22.1.   REVIEW OF SYSTEMS:   Constitutional: Denies fevers, chills (+) abnormal weight loss, 14lbs (+) night sweats Eyes:  Denies blurriness of vision Ears, nose, mouth, throat, and face: Denies mucositis or sore throat (+) difficulty swallowing Respiratory: Denies cough, dyspnea or wheezes Cardiovascular: Denies palpitation (+) chest discomfort after eating Gastrointestinal:  Denies nausea (+) acid reflux (+) indigestion  Skin: Denies abnormal skin rashes MSK: (+) lower back pain  Lymphatics: Denies new lymphadenopathy or easy bruising Neurological: Denies numbness, tingling or new weaknesses Behavioral/Psych: Mood is stable, no new changes  Extremities: No lower extremity edema All other systems were reviewed with the patient and are negative.  I have reviewed the past medical history, past surgical history, social history and family history with the patient and they are unchanged from previous note.  ALLERGIES:  is allergic to hctz [hydrochlorothiazide] and lipitor [atorvastatin].  MEDICATIONS:  Current Outpatient Medications  Medication Sig Dispense Refill  . amLODipine (NORVASC) 10 MG tablet Take 1 tablet (10 mg total) by mouth daily. (Patient not taking: Reported on 07/18/2018) 30 tablet 11  . Cholecalciferol (VITAMIN D-3) 5000 units TABS Take 1 tablet by mouth daily.    . famotidine (PEPCID) 20 MG tablet Take 1 tablet (20 mg total) by mouth 2 (two) times daily. (Patient not taking: Reported on 07/18/2018) 60 tablet 3  . lisinopril (PRINIVIL,ZESTRIL) 10 MG tablet Take 0.5 tablets (5 mg total) by mouth daily. (Patient not taking: Reported on 07/18/2018) 15 tablet 11  . methocarbamol (ROBAXIN) 750 MG tablet Take 1 tablet (750 mg total) by mouth 3 (three) times daily as needed for muscle spasms. Caution of sedation (Patient not taking: Reported on 07/18/2018)  30 tablet 1  . Multiple Vitamins-Minerals (PRESERVISION AREDS 2 PO) Take by mouth.    Marland Kitchen omeprazole (PRILOSEC) 40 MG capsule Take 1 capsule (40 mg total) by mouth 2 (two) times daily before a meal. 90 capsule 3  . rosuvastatin (CRESTOR) 10 MG tablet Take 1  tablet (10 mg total) by mouth daily. (Patient not taking: Reported on 07/18/2018) 90 tablet 3   Current Facility-Administered Medications  Medication Dose Route Frequency Provider Last Rate Last Dose  . 0.9 %  sodium chloride infusion  500 mL Intravenous Once Nandigam, Venia Minks, MD        PHYSICAL EXAMINATION: ECOG PERFORMANCE STATUS: 1 - Symptomatic but completely ambulatory  Vitals:   07/31/18 1208  BP: (!) 156/70  Pulse: 88  Resp: 17  Temp: 97.6 F (36.4 C)  SpO2: 100%   Filed Weights   07/31/18 1208  Weight: 148 lb 14.4 oz (67.5 kg)    GENERAL: alert, no distress and comfortable SKIN: skin color, texture, turgor are normal, no rashes or significant lesions EYES: normal, Conjunctiva are pink and non-injected, sclera clear OROPHARYNX: no exudate, no erythema and lips, buccal mucosa, and tongue normal  NECK: supple, thyroid normal size, non-tender, without nodularity LYMPH: (+) palpable right and left axillary lymphadenopathy   LUNGS: clear to auscultation and percussion with normal breathing effort HEART: regular rate & rhythm and no murmurs and no lower extremity edema ABDOMEN: abdomen soft, non-tender and normal bowel sounds MUSCULOSKELETAL: no cyanosis of digits and no clubbing  NEURO: alert & oriented x 3 with fluent speech, no focal motor/sensory deficits EXTREMITIES: No lower extremity edema  LABORATORY DATA:   I have reviewed the data as listed CMP Latest Ref Rng & Units 07/10/2018 06/11/2018 02/27/2018  Glucose 70 - 99 mg/dL 95 99 90  BUN 6 - 23 mg/dL 14 15 17   Creatinine 0.40 - 1.20 mg/dL 1.21(H) 1.00 0.95  Sodium 135 - 145 mEq/L 142 133(L) 143  Potassium 3.5 - 5.1 mEq/L 4.0 4.0 3.9  Chloride 96 - 112 mEq/L 107 96 108  CO2 19 - 32 mEq/L 30 27 28   Calcium 8.4 - 10.5 mg/dL 9.5 10.0 9.7  Total Protein 6.0 - 8.3 g/dL 6.1 8.0 7.2  Total Bilirubin 0.2 - 1.2 mg/dL 0.4 0.5 0.3  Alkaline Phos 39 - 117 U/L 42 65 75  AST 0 - 37 U/L 10 19 16   ALT 0 - 35 U/L 10 10 16      Lab Results  Component Value Date   WBC 22.1 Repeated and verified X2. 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 Patient has prominent bilateral axillary and right cervical lymph nodes.  Under the arm he feels like a golf ball Dysphagia: Due to bulky mediastinal adenopathy CT chest: 07/24/2018: Bulky mediastinal, hilar, subpectoral, axillary and abdominal lymphadenopathy compatible with lymphoma.  Plan: 1.  Labs today to assess CBC, CMP, LDH, flow cytometry and FISH panel for CLL 2.  PET CT scan to assess the hypermetabolic activity of the lymph nodes and to determine which lymph node to biopsy. 3.  Return to clinic after the biopsy to discuss her treatment plan.  Patient  took prednisone for low back pain and apparently had significant decrease in the lymph node enlargement as well as decreasing the dysphagia.  She finished prednisone 3 weeks ago.  Treatment discussion: I discussed with the patient that given the extent of the lymphadenopathy, it is reasonable to consider systemic treatment. We discussed that chemotherapy with bendamustine and Rituxan every 4 weeks days 1 and 2 for 6 cycles for aggressive lymphoma versus ibrutinib  We will make final decisions after the biopsies are available.   Orders Placed This Encounter  Procedures  . NM PET Image Initial (PI) Skull Base To Thigh    Standing Status:   Future    Standing Expiration Date:   07/31/2019    Order Specific Question:   ** REASON FOR EXAM (FREE TEXT)    Answer:   Staging of NHL    Order Specific Question:   If indicated for the ordered procedure, I authorize the  administration of a radiopharmaceutical per Radiology protocol    Answer:   Yes    Order Specific Question:   Preferred imaging location?    Answer:   [facility]    Order Specific Question:   Radiology Contrast Protocol - do NOT remove file path    Answer:   [email or web address]  . CBC with Differential ([facility] Only)    Standing Status:   Future    Standing Expiration Date:   08/01/2019  . CMP ([facility] only)    Standing Status:   Future    Standing Expiration Date:   08/01/2019  . Lactate dehydrogenase (LDH)    Standing Status:   Future    Standing Expiration Date:   07/31/2019  . FISH, CLL Prognostic Panel    Standing Status:   Future    Standing Expiration Date:   08/01/2019  . Flow Cytometry    CLL    Standing Status:   Future    Standing Expiration Date:   07/31/2019   The patient has a good understanding of the overall plan. she agrees with it. she will call with any problems that may develop before the next visit here.  Nicholas Lose, MD 07/31/2018  Julious Oka Dorshimer am acting as scribe for Dr. Nicholas Lose.  I have reviewed the above documentation for accuracy and completeness, and I agree with the above.

## 2018-07-31 NOTE — Assessment & Plan Note (Signed)
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 Patient has prominent bilateral axillary and right cervical lymph nodes.  Under the arm he feels like a golf ball  CT chest: 07/24/2018: Bulky mediastinal, hilar, subpectoral, axillary and abdominal lymphadenopathy compatible with lymphoma.  Treatment discussion: I discussed with the patient that given the extent of the lymphadenopathy, it is reasonable to consider systemic treatment. We discussed that chemotherapy with bendamustine and Rituxan every 4 weeks days 1 and 2 for 6 cycles.  Chemo counseling: I discussed risks and benefits of chemotherapy including the risk of infection, cytopenias, nausea, fatigue, hair loss, Rituxan related infusion reactions and even the risk of dying from chemotherapy and long-term bone marrow suppression.

## 2018-08-01 LAB — FLOW CYTOMETRY

## 2018-08-07 ENCOUNTER — Telehealth: Payer: Self-pay

## 2018-08-07 NOTE — Telephone Encounter (Signed)
Contacted patient per [facility].   Patient needs clarification regarding lab appointment on Monday. Patient recently had CBC with diff completed by oncology on 07/31/18. Routing clarification request to PCP for a response.   Patient was asked to contact office on 08/08/18 if she has not received a response by 2PM. Patient verbalized understanding.

## 2018-08-07 NOTE — Telephone Encounter (Signed)
She can cancel that lab appt with [place] Thanks  Sent to [place]

## 2018-08-08 NOTE — Telephone Encounter (Signed)
Patient advised and appointment cancelled

## 2018-08-11 ENCOUNTER — Other Ambulatory Visit: Payer: Medicare Other

## 2018-08-15 ENCOUNTER — Encounter (HOSPITAL_COMMUNITY)
Admission: RE | Admit: 2018-08-15 | Discharge: 2018-08-15 | Disposition: A | Discharge status: Home / Self Care | Payer: Medicare Other | Source: Ambulatory Visit | Attending: Hematology and Oncology | Admitting: Hematology and Oncology

## 2018-08-15 DIAGNOSIS — C8300 Small cell B-cell lymphoma, unspecified site: Secondary | ICD-10-CM

## 2018-08-15 LAB — GLUCOSE, CAPILLARY: Glucose-Capillary: 88 mg/dL (ref 70–99)

## 2018-08-15 MED ORDER — FLUDEOXYGLUCOSE F - 18 (FDG) INJECTION
7.4100 | Freq: Once | INTRAVENOUS | Status: AC | PRN
  Administered 2018-08-15: 7.41 via INTRAVENOUS

## 2018-08-19 ENCOUNTER — Other Ambulatory Visit: Payer: Self-pay | Admitting: Cardiology

## 2018-08-20 LAB — FISH,CLL PROGNOSTIC PANEL

## 2018-08-22 ENCOUNTER — Other Ambulatory Visit: Payer: Self-pay | Admitting: Cardiology

## 2018-08-22 MED ORDER — ROSUVASTATIN CALCIUM 10 MG PO TABS: 10.0000 mg | ORAL_TABLET | Freq: Every day | ORAL | 0 refills | Status: DC

## 2018-08-25 ENCOUNTER — Inpatient Hospital Stay: Payer: Medicare Other | Attending: Hematology and Oncology | Admitting: Hematology and Oncology

## 2018-08-25 DIAGNOSIS — R1310 Dysphagia, unspecified: Secondary | ICD-10-CM | POA: Diagnosis not present

## 2018-08-25 DIAGNOSIS — C8300 Small cell B-cell lymphoma, unspecified site: Secondary | ICD-10-CM | POA: Diagnosis not present

## 2018-08-25 NOTE — Assessment & Plan Note (Signed)
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 Patient has prominent bilateral axillary and right cervical lymph nodes. Under the arm he feels like a golf ball Dysphagia: Due to bulky mediastinal adenopathy CT chest: 07/24/2018: Bulky mediastinal, hilar, subpectoral, axillary and abdominal lymphadenopathy compatible with lymphoma. PET CT 08/15/2018: Bulky lymphadenopathy in the neck chest abdomen pelvis low-level activity. ------------------------------------------------------------------------------------------------------------------------------------------------ Radiology review: I discussed with the patient a PET CT scan and provided her with a copy of this report. Because of dysphagia she would probably require treatment. I will refer the patient to Dr.Kale to discuss treatment options.

## 2018-08-25 NOTE — Progress Notes (Signed)
Patient Care Team: Tower, Wynelle Fanny, MD as PCP - General (Family Medicine) Nicholas Lose, MD as Consulting Physician (Hematology and Oncology) Galvin Proffer, OD as Consulting Physician (Optometry)  DIAGNOSIS:  Encounter Diagnosis  Name Primary?  . 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  Because of this we obtain scans to assess which lymph node could be easily biopsied and it turns out that she has low level of hypermetabolic activity throughout without any one particular group of lymph nodes being more hypermetabolic.  She is here today accompanied by her family to discuss her treatment plan. Her biggest complaints are related to dysphagia.  REVIEW OF SYSTEMS:   Constitutional: Denies fevers, chills or abnormal weight loss Eyes: Denies blurriness of vision Ears, nose, mouth, throat, and face: Denies mucositis or sore throat Respiratory: Denies cough, dyspnea or wheezes Cardiovascular: Denies palpitation, chest discomfort Gastrointestinal: Difficulty swallowing Skin: Denies abnormal skin rashes Lymphatics: Denies new lymphadenopathy or easy bruising Neurological:Denies numbness, tingling or new weaknesses Behavioral/Psych: Mood is stable, no new changes  Extremities: No lower extremity edema   All other systems were reviewed with the patient and are negative.  I have reviewed the past medical history, past surgical history, social history and family history with the patient  and they are unchanged from previous note.  ALLERGIES:  is allergic to hctz [hydrochlorothiazide] and lipitor [atorvastatin].  MEDICATIONS:  Current Outpatient Medications  Medication Sig Dispense Refill  . amLODipine (NORVASC) 10 MG tablet Take 1 tablet (10 mg total) by mouth daily. (Patient not taking: Reported on 07/18/2018) 30 tablet 11  . Cholecalciferol (VITAMIN D-3) 5000 units TABS Take 1 tablet by mouth daily.    . famotidine (PEPCID) 20 MG tablet Take 1 tablet (20 mg total) by mouth 2 (two) times daily. (Patient not taking: Reported on 07/18/2018) 60 tablet 3  . lisinopril (PRINIVIL,ZESTRIL) 10 MG tablet Take 0.5 tablets (5 mg total) by mouth daily. (Patient not taking: Reported on 07/18/2018) 15 tablet 11  . methocarbamol (ROBAXIN) 750 MG tablet Take 1 tablet (750 mg total) by mouth 3 (three) times daily as needed for muscle spasms. Caution of sedation (Patient not taking: Reported on 07/18/2018) 30 tablet 1  . Multiple Vitamins-Minerals (PRESERVISION AREDS 2 PO) Take by mouth.    Marland Kitchen omeprazole (PRILOSEC) 40 MG capsule Take 1 capsule (40 mg total) by mouth 2 (two) times daily before a meal. 90 capsule 3  . rosuvastatin (CRESTOR) 10 MG tablet Take 1 tablet (10 mg total) by mouth daily. Please make overdue appt with Dr. Marlou Porch before anymore refills. 2nd attempt 15 tablet 0   Current Facility-Administered Medications  Medication Dose Route Frequency Provider Last Rate Last Dose  . 0.9 %  sodium chloride infusion  500 mL Intravenous Once Nandigam, Kavitha V, MD        PHYSICAL EXAMINATION: ECOG PERFORMANCE STATUS: 1 - Symptomatic but completely ambulatory  Vitals:   08/25/18 1143  BP: (!) 147/75  Pulse: 90  Resp: 16  Temp: 98.3 F (36.8 C)  SpO2:  98%   Filed Weights   08/25/18 1143  Weight: 142 lb 4.8 oz (64.5 kg)    GENERAL:alert, no distress and comfortable SKIN: skin color, texture, turgor are normal, no rashes or significant lesions EYES: normal, Conjunctiva are pink and  non-injected, sclera clear OROPHARYNX:no exudate, no erythema and lips, buccal mucosa, and tongue normal  NECK: supple, thyroid normal size, non-tender, without nodularity LYMPH: Both cervical and bilateral axillary lymphadenopathy LUNGS: clear to auscultation and percussion with normal breathing effort HEART: regular rate & rhythm and no murmurs and no lower extremity edema ABDOMEN:abdomen soft, non-tender and normal bowel sounds MUSCULOSKELETAL:no cyanosis of digits and no clubbing  NEURO: alert & oriented x 3 with fluent speech, no focal motor/sensory deficits EXTREMITIES: No lower extremity edema    LABORATORY DATA:  I have reviewed the data as listed CMP Latest Ref Rng & Units 07/31/2018 07/10/2018 06/11/2018  Glucose 70 - 99 mg/dL 84 95 99  BUN 8 - 23 mg/dL 14 14 15   Creatinine 0.44 - 1.00 mg/dL 1.14(H) 1.21(H) 1.00  Sodium 135 - 145 mmol/L 144 142 133(L)  Potassium 3.5 - 5.1 mmol/L 3.9 4.0 4.0  Chloride 98 - 111 mmol/L 109 107 96  CO2 22 - 32 mmol/L 26 30 27   Calcium 8.9 - 10.3 mg/dL 9.4 9.5 10.0  Total Protein 6.5 - 8.1 g/dL 6.6 6.1 8.0  Total Bilirubin 0.3 - 1.2 mg/dL <0.2(L) 0.4 0.5  Alkaline Phos 38 - 126 U/L 52 42 65  AST 15 - 41 U/L 11(L) 10 19  ALT 0 - 44 U/L 10 10 10     Lab Results  Component Value Date   WBC 19.4 (H) 07/31/2018   HGB 10.5 (L) 07/31/2018   HCT 33.8 (L) 07/31/2018   MCV 87.6 07/31/2018   PLT 293 07/31/2018   NEUTROABS 1.9 07/31/2018    ASSESSMENT & PLAN:  Lymphoma, small lymphocytic ([facility]) Right axillary lymph node biopsy: 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 Patient has prominent bilateral axillary and right cervical lymph nodes. Under the arm he feels like a golf ball Dysphagia: Due to bulky mediastinal adenopathy CT chest: 07/24/2018: Bulky mediastinal, hilar, subpectoral, axillary and abdominal lymphadenopathy  compatible with lymphoma. PET CT 08/15/2018: Bulky lymphadenopathy in the neck chest abdomen pelvis low-level activity. ------------------------------------------------------------------------------------------------------------------------------------------------ Radiology review: I discussed with the patient a PET CT scan and provided her with a copy of this report. Because of dysphagia she would probably require treatment. I will refer the patient to Dr.Kale to discuss treatment options. -I will set her up for a needle core biopsy of the axillary lymph node to be sent for testing.  We will try to get her an appointment to see Dr.Kale to discuss treatment options.   No orders of the defined types were placed in this encounter.  The patient has a good understanding of the overall plan. she agrees with it. she will call with any problems that may develop before the next visit here.   Harriette Ohara, MD 08/25/18

## 2018-08-26 ENCOUNTER — Telehealth: Payer: Self-pay | Admitting: Hematology and Oncology

## 2018-08-26 NOTE — Telephone Encounter (Signed)
No los °

## 2018-09-12 ENCOUNTER — Telehealth: Payer: Self-pay

## 2018-09-12 ENCOUNTER — Other Ambulatory Visit: Payer: Self-pay

## 2018-09-12 ENCOUNTER — Telehealth: Payer: Self-pay | Admitting: Hematology

## 2018-09-12 DIAGNOSIS — C8300 Small cell B-cell lymphoma, unspecified site: Secondary | ICD-10-CM

## 2018-09-12 NOTE — Progress Notes (Signed)
Per Dr.Gudena, needle core biopsy of Right axillary lymph node ordered. Sent for PA and will schedule to get done within 1 week.

## 2018-09-12 NOTE — Telephone Encounter (Signed)
Contacted pt to notify her of ordered lymph node biopsy. Waiting to hear from IR for an appt. Pt should be contacted by [facility] for biopsy. Pt to call MD back if she does not hear by next week.

## 2018-09-12 NOTE — Telephone Encounter (Signed)
Scheduled appt per 3/6 sch message - pt aware of apt date and time

## 2018-09-19 ENCOUNTER — Other Ambulatory Visit: Payer: Self-pay | Admitting: Radiology

## 2018-09-22 ENCOUNTER — Ambulatory Visit (HOSPITAL_COMMUNITY)
Admission: RE | Admit: 2018-09-22 | Discharge: 2018-09-22 | Disposition: A | Discharge status: Home / Self Care | Payer: Medicare Other | Source: Ambulatory Visit | Attending: Hematology and Oncology | Admitting: Hematology and Oncology

## 2018-09-22 ENCOUNTER — Other Ambulatory Visit: Payer: Self-pay | Admitting: Hematology and Oncology

## 2018-09-22 ENCOUNTER — Other Ambulatory Visit: Payer: Self-pay

## 2018-09-22 ENCOUNTER — Encounter (HOSPITAL_COMMUNITY): Payer: Self-pay

## 2018-09-22 DIAGNOSIS — E785 Hyperlipidemia, unspecified: Secondary | ICD-10-CM | POA: Insufficient documentation

## 2018-09-22 DIAGNOSIS — Z79899 Other long term (current) drug therapy: Secondary | ICD-10-CM | POA: Diagnosis not present

## 2018-09-22 DIAGNOSIS — M1711 Unilateral primary osteoarthritis, right knee: Secondary | ICD-10-CM | POA: Diagnosis not present

## 2018-09-22 DIAGNOSIS — I10 Essential (primary) hypertension: Secondary | ICD-10-CM | POA: Diagnosis not present

## 2018-09-22 DIAGNOSIS — R590 Localized enlarged lymph nodes: Secondary | ICD-10-CM | POA: Insufficient documentation

## 2018-09-22 DIAGNOSIS — C8300 Small cell B-cell lymphoma, unspecified site: Secondary | ICD-10-CM

## 2018-09-22 LAB — CBC WITH DIFFERENTIAL/PLATELET
Abs Immature Granulocytes: 0.01 10*3/uL (ref 0.00–0.07)
Basophils Absolute: 0 10*3/uL (ref 0.0–0.1)
Basophils Relative: 0 %
Eosinophils Absolute: 0.2 10*3/uL (ref 0.0–0.5)
Eosinophils Relative: 1 %
HCT: 36.7 % (ref 36.0–46.0)
Hemoglobin: 10.9 g/dL — ABNORMAL LOW (ref 12.0–15.0)
Immature Granulocytes: 0 %
Lymphocytes Relative: 67 %
Lymphs Abs: 11.3 10*3/uL — ABNORMAL HIGH (ref 0.7–4.0)
MCH: 27.2 pg (ref 26.0–34.0)
MCHC: 29.7 g/dL — ABNORMAL LOW (ref 30.0–36.0)
MCV: 91.5 fL (ref 80.0–100.0)
Monocytes Absolute: 4.1 10*3/uL — ABNORMAL HIGH (ref 0.1–1.0)
Monocytes Relative: 25 %
Neutro Abs: 1.2 10*3/uL — ABNORMAL LOW (ref 1.7–7.7)
Neutrophils Relative %: 7 %
Platelets: 272 10*3/uL (ref 150–400)
RBC: 4.01 MIL/uL (ref 3.87–5.11)
RDW: 14.6 % (ref 11.5–15.5)
WBC Morphology: ABNORMAL
WBC: 16.8 10*3/uL — ABNORMAL HIGH (ref 4.0–10.5)
nRBC: 0 % (ref 0.0–0.2)

## 2018-09-22 LAB — PROTIME-INR
INR: 1 (ref 0.8–1.2)
Prothrombin Time: 13.4 seconds (ref 11.4–15.2)

## 2018-09-22 LAB — BASIC METABOLIC PANEL
Anion gap: 9 (ref 5–15)
BUN: 20 mg/dL (ref 8–23)
CO2: 24 mmol/L (ref 22–32)
Calcium: 9.5 mg/dL (ref 8.9–10.3)
Chloride: 108 mmol/L (ref 98–111)
Creatinine, Ser: 1.06 mg/dL — ABNORMAL HIGH (ref 0.44–1.00)
GFR calc Af Amer: >60 mL/min (ref >60)
GFR, EST NON AFRICAN AMERICAN: 53 mL/min — AB (ref >60)
Glucose, Bld: 88 mg/dL (ref 70–99)
Potassium: 3.5 mmol/L (ref 3.5–5.1)
Sodium: 141 mmol/L (ref 135–145)

## 2018-09-22 MED ORDER — LIDOCAINE HCL 1 % IJ SOLN
INTRAMUSCULAR | Status: AC
  Filled 2018-09-22: qty 20

## 2018-09-22 MED ORDER — MIDAZOLAM HCL 2 MG/2ML IJ SOLN
INTRAMUSCULAR | Status: AC
  Filled 2018-09-22: qty 2

## 2018-09-22 MED ORDER — LIDOCAINE HCL (PF) 1 % IJ SOLN
INTRAMUSCULAR | Status: AC | PRN
  Administered 2018-09-22: 10 mL

## 2018-09-22 MED ORDER — MIDAZOLAM HCL 2 MG/2ML IJ SOLN
INTRAMUSCULAR | Status: AC | PRN
  Administered 2018-09-22 (×2): 1 mg via INTRAVENOUS

## 2018-09-22 MED ORDER — LIDOCAINE-EPINEPHRINE (PF) 2 %-1:200000 IJ SOLN
INTRAMUSCULAR | Status: AC
  Filled 2018-09-22: qty 20

## 2018-09-22 MED ORDER — SODIUM CHLORIDE 0.9 % IV SOLN
INTRAVENOUS | Status: DC
  Administered 2018-09-22: 12:00:00 via INTRAVENOUS

## 2018-09-22 MED ORDER — FENTANYL CITRATE (PF) 100 MCG/2ML IJ SOLN
INTRAMUSCULAR | Status: AC
  Filled 2018-09-22: qty 2

## 2018-09-22 MED ORDER — FENTANYL CITRATE (PF) 100 MCG/2ML IJ SOLN
INTRAMUSCULAR | Status: AC | PRN
  Administered 2018-09-22: 50 ug via INTRAVENOUS

## 2018-09-22 NOTE — Procedures (Signed)
Pre Procedure Dx: Axillary lymphadenopathy Post Procedural Dx: Same  Technically successful US guided biopsy of bulky right axillary lymph node.   EBL: None  No immediate complications.   Ronny Bacon, MD Pager #: [phone]

## 2018-09-22 NOTE — Consult Note (Addendum)
Chief Complaint: Patient was seen in consultation today for image guided right axillary lymph node biopsy  Referring Physician(s): [place]  Supervising Physician: Sandi Mariscal  Patient Status: [facility] - Out-pt  History of Present Illness: Erica Carlson is a 73 y.o. 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 She presents today for image guided rt axillary lymph node biopsy for further evaluation.  Past Medical History:  Diagnosis Date  . Allergy    spring  . Arthritis    osteo arthritis of right knee  . Cataract    forming  . Hyperlipidemia   . Hypertension   . Lymphoma, small-cell ([place])    right axillary   . Pneumonia     Past Surgical History:  Procedure Laterality Date  . ABDOMINAL HYSTERECTOMY    . AXILLARY LYMPH NODE BIOPSY Right 02/07/2016  . back injections    . BREAST SURGERY    . COLONOSCOPY  2009  . EYE SURGERY     retinal tear repair  . KNEE SURGERY     torn ALC per pt    Allergies: Hctz [hydrochlorothiazide] and Lipitor [atorvastatin]  Medications: Prior to Admission medications   Medication Sig Start Date End Date Taking? Authorizing Provider  amLODipine (NORVASC) 10 MG tablet Take 1 tablet (10 mg total) by mouth daily. 07/14/18   Tower, Wynelle Fanny, MD  Cholecalciferol (VITAMIN D-3) 5000 units TABS Take 1 tablet by mouth daily.    [provider]  famotidine (PEPCID) 20 MG tablet Take 1 tablet (20 mg total) by mouth 2 (two) times daily. Patient not taking: Reported on 07/18/2018 06/11/18   Tower, Roque Lias A, MD  lisinopril (PRINIVIL,ZESTRIL) 10 MG tablet Take 0.5 tablets (5 mg total) by mouth daily. 07/14/18   Tower,  Wynelle Fanny, MD  methocarbamol (ROBAXIN) 750 MG tablet Take 1 tablet (750 mg total) by mouth 3 (three) times daily as needed for muscle spasms. Caution of sedation Patient not taking: Reported on 07/18/2018 06/13/18   Tower, Wynelle Fanny, MD  Multiple Vitamins-Minerals (PRESERVISION AREDS 2 PO) Take by mouth.    [provider]  omeprazole (PRILOSEC) 40 MG capsule Take 1 capsule (40 mg total) by mouth 2 (two) times daily before a meal. 07/16/18   Nandigam, Venia Minks, MD  rosuvastatin (CRESTOR) 10 MG tablet Take 1 tablet (10 mg total) by mouth daily. Please make overdue appt with Dr. Marlou Porch before anymore refills. 2nd attempt 08/22/18   Jerline Pain, MD     Family History  Problem Relation Age of Onset  . Breast cancer Sister   . Hyperlipidemia Son   . Schizophrenia Mother   . Colon cancer Neg Hx   . Colon polyps Neg Hx   . Esophageal cancer Neg Hx   . Rectal cancer Neg Hx   . Stomach cancer Neg Hx     Social History   Socioeconomic History  . Marital status: Widowed    Spouse name: n/a  . Number of children: 6  . Years of education: 56  . Highest education level: Not on file  Occupational History  . 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 Financial resource strain:  Not on file  . Food insecurity:    Worry: Not on file    Inability: Not on file  . Transportation needs:    Medical: Not on file    Non-medical: Not on file  Tobacco Use  . Smoking status: Never Smoker  . Smokeless tobacco: Never Used  Substance and Sexual Activity  . Alcohol use: No    Alcohol/week: 0.0 standard drinks  . Drug use: No  . Sexual activity: Never  Lifestyle  . Physical activity:    Days per week: Not on file    Minutes per session: Not on file  . Stress: Not on file  Relationships  . Social connections:    Talks on phone: Not on file    Gets together: Not on file    Attends religious service: Not on file    Active  member of club or organization: Not on file    Attends meetings of clubs or organizations: Not on file    Relationship status: Not on file  Other Topics Concern  . Not on file  Social History Narrative   Lives alone.   Her adult children live nearby.     Review of Systems denies fever,HA,CP,dyspneas, cough, abd pain,vomying or bleeding. She does have back pain and occ nausea, some dysphagia, fatigue.  Vital Signs: Blood pressure 174/89, heart rate 76, respirations 20, temp 98.2, O2 sat 100% room air    Physical Exam awake, alert.  Chest with slightly diminished breath sounds right base, left clear.  Heart with regular rate and rhythm.  Abdomen soft, positive bowel sounds, nontender.  No lower extremity edema.  Bilateral cervical/axillary lymphadenopathy,NT.  Imaging: No results found.  Labs:  CBC: Recent Labs    02/27/18 1054 06/11/18 1532 07/10/18 0949 07/31/18 1313  WBC 13.7* 16.2* 22.1 Repeated and verified X2.* 19.4*  HGB 11.6 11.6* 11.4* 10.5*  HCT 36.4 35.8* 36.5 33.8*  PLT 255 358.0 239.0 293    COAGS: Recent Labs    09/22/18 1150  INR 1.0    BMP: Recent Labs    02/27/18 1054 06/11/18 1532 07/10/18 0949 07/31/18 1313 09/22/18 1150  NA 143 133* 142 144 141  K 3.9 4.0 4.0 3.9 3.5  CL 108 96 107 109 108  CO2 28 27 30 26 24   GLUCOSE 90 99 95 84 88  BUN 17 15 14 14 20   CALCIUM 9.7 10.0 9.5 9.4 9.5  CREATININE 0.95 1.00 1.21* 1.14* 1.06*  GFRNONAA 59*  --   --  48* 53*  GFRAA >60  --   --  56* >60    LIVER FUNCTION TESTS: Recent Labs    02/27/18 1054 06/11/18 1532 07/10/18 0949 07/31/18 1313  BILITOT 0.3 0.5 0.4 <0.2*  AST 16 19 10  11*  ALT 16 10 10 10   ALKPHOS 75 65 42 52  PROT 7.2 8.0 6.1 6.6  ALBUMIN 4.1 4.4 3.9 3.6    TUMOR MARKERS: No results for input(s): AFPTM, CEA, CA199, CHROMGRNA in the last 8760 hours.  Assessment and Plan: 73 y.o. 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 She presents today for image guided rt axillary lymph node biopsy for further evaluation.Risks and benefits of procedure was discussed with the  patient and/or patient's family including, but not limited to bleeding, infection, damage to adjacent structures or low yield requiring additional tests.  All of the questions were answered and there is agreement to proceed.  Consent signed and in chart.  LABS PENDING     Thank you for this interesting consult.  I greatly enjoyed meeting Erica Carlson and look forward to participating in their care.  A copy of this report was sent to the requesting provider on this date.  Electronically Signed: D. Rowe Robert, PA-C 09/22/2018, 12:30 PM   I spent a total of  20 minutes   in face to face in clinical consultation, greater than 50% of which was counseling/coordinating care for image guided right axillary lymph node biopsy

## 2018-09-22 NOTE — Discharge Instructions (Addendum)
Needle Biopsy, Care After °These instructions tell you how to care for yourself after your procedure. Your doctor may also give you more specific instructions. Call your doctor if you have any problems or questions. °What can I expect after the procedure? °After the procedure, it is common to have: °· Soreness. °· Bruising. °· Mild pain. °Follow these instructions at home: ° °· Return to your normal activities as told by your doctor. Ask your doctor what activities are safe for you. °· Take over-the-counter and prescription medicines only as told by your doctor. °· Wash your hands with soap and water before you change your bandage (dressing). If you cannot use soap and water, use hand sanitizer. °· Follow instructions from your doctor about: °? How to take care of your puncture site. °? When and how to change your bandage. °? When to remove your bandage. °· Check your puncture site every day for signs of infection. Watch for: °? Redness, swelling, or pain. °? Fluid or blood.  °? Pus or a bad smell. °? Warmth. °· Do not take baths, swim, or use a hot tub until your doctor approves. Ask your doctor if you may take showers. You may only be allowed to take sponge baths. °· Keep all follow-up visits as told by your doctor. This is important. °Contact a doctor if you have: °· A fever. °· Redness, swelling, or pain at the puncture site, and it lasts longer than a few days. °· Fluid, blood, or pus coming from the puncture site. °· Warmth coming from the puncture site. °Get help right away if: °· You have a lot of bleeding from the puncture site. °Summary °· After the procedure, it is common to have soreness, bruising, or mild pain at the puncture site. °· Check your puncture site every day for signs of infection, such as redness, swelling, or pain. °· Get help right away if you have severe bleeding from your puncture site. °This information is not intended to replace advice given to you by your health care provider. Make  sure you discuss any questions you have with your health care provider. °Document Released: 06/07/2008 Document Revised: 07/08/2017 Document Reviewed: 07/08/2017 °Elsevier Interactive Patient Education © 2019 [employer] °Moderate Conscious Sedation, Adult, Care After °These instructions provide you with information about caring for yourself after your procedure. Your health care provider may also give you more specific instructions. Your treatment has been planned according to current medical practices, but problems sometimes occur. Call your health care provider if you have any problems or questions after your procedure. °What can I expect after the procedure? °After your procedure, it is common: °· To feel sleepy for several hours. °· To feel clumsy and have poor balance for several hours. °· To have poor judgment for several hours. °· To vomit if you eat too soon. °Follow these instructions at home: °For at least 24 hours after the procedure: ° °· Do not: °? Participate in activities where you could fall or become injured. °? Drive. °? Use heavy machinery. °? Drink alcohol. °? Take sleeping pills or medicines that cause drowsiness. °? Make important decisions or sign legal documents. °? Take care of children on your own. °· Rest. °Eating and drinking °· Follow the diet recommended by your health care provider. °· If you vomit: °? Drink water, juice, or soup when you can drink without vomiting. °? Make sure you have little or no nausea before eating solid foods. °General instructions °· Have a responsible adult stay   with you until you are awake and alert. °· Take over-the-counter and prescription medicines only as told by your health care provider. °· If you smoke, do not smoke without supervision. °· Keep all follow-up visits as told by your health care provider. This is important. °Contact a health care provider if: °· You keep feeling nauseous or you keep vomiting. °· You feel light-headed. °· You develop a  rash. °· You have a fever. °Get help right away if: °· You have trouble breathing. °This information is not intended to replace advice given to you by your health care provider. Make sure you discuss any questions you have with your health care provider. °Document Released: 04/15/2013 Document Revised: 11/28/2015 Document Reviewed: 10/15/2015 °Elsevier Interactive Patient Education © 2019 [employer] ° °

## 2018-09-30 NOTE — Progress Notes (Signed)
HEMATOLOGY/ONCOLOGY CONSULTATION NOTE  Date of Service: 10/01/2018  Patient Care Team: Tower, Wynelle Fanny, MD as PCP - General (Family Medicine) Nicholas Lose, MD as Consulting Physician (Hematology and Oncology) Galvin Proffer, OD as Consulting Physician (Optometry)  CHIEF COMPLAINTS/PURPOSE OF CONSULTATION:  Chronic Lymphocytic Leukemia  HISTORY OF PRESENTING ILLNESS:   Erica Carlson is a wonderful 73 y.o. female who has been referred to [place] by my colleague Dr. 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 The pt reports that she is doing well overall.   The pt reports that her SLL was initially picked up tthrough a mammogram in 2017. She notes that her CLL/SLL was stable until December 2019. She notes that she began having feelings of indigestion when she eats. She denies issues with acid reflux. The pt notes that her indigestion began making it difficult to eat, but also endorses a weakened appetite. She lost about 25 pounds, but has gained back 5 pounds. The pt had an unrevealing endoscopy on 07/18/18. She notes that she has some difficulty eating meats. She is staying well hydrated. She has experimented with eating different foods. She endorses having a "lot of gas," and puts a teaspoon of her Mylanta in her coffee every morning.  The pt denies any fevers, chills, or night sweats. She has noticed some lumps in her bilateral neck and armpits. She denies other abdominal pains as such. She denies problems passing urine, nor increased urinary frequency. She denies CP or SOB. She denies leg swelling.   Denies liver, kidney, heart, or lung problems and endorses being generally pretty healthy.  Of note prior to the patient's visit today, pt has had a PET/CT completed on 08/15/18 with results revealing Bulky lymphadenopathy in the neck, chest, abdomen, and pelvis demonstrating low level hypermetabolism throughout. 9 mm right thyroid nodule is  hypermetabolic. Thyroid ultrasound recommended to further evaluate. Mucosal thickening with air-fluid level in the left maxillary sinus. 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  Most recent lab results (09/22/18) of CBC w/diff and BMP is as follows: all values are WNL except for WBC at 16.8k, HGB at 10.9, MCHC at 29.7, ANC at 1.2k, Lymphs abs at 11.3k, Monocytes abs at 4.1k, Creatinine at 1.06.  On review of systems, pt reports frequent indigestion, weakened appetite, weight loss, weaker energy levels, bilateral neck bumps, bilateral armpit bumps, and denies CP, SOB, abdominal pain, leg swelling, fevers, chills, night sweats, lower abdominal pains, changes in bowel habits, and any other symptoms.  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 Allergy    spring  . Arthritis    osteo arthritis of right knee  . Cataract    forming  . Hyperlipidemia   . Hypertension   . 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 Pneumonia     SURGICAL HISTORY: Past Surgical History:  Procedure Laterality Date  . ABDOMINAL HYSTERECTOMY    . AXILLARY LYMPH NODE BIOPSY Right 02/07/2016  . back injections    . BREAST SURGERY    . COLONOSCOPY  2009  . EYE SURGERY     retinal tear repair  . KNEE SURGERY     torn ALC per pt    SOCIAL HISTORY: Social History   Socioeconomic History  . Marital status: Widowed    Spouse name: n/a  . Number  of children: 6  . Years of education: 30  . Highest education level: Not on file  Occupational History  . 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 Financial resource strain: Not on file  . Food insecurity:    Worry: Not on file    Inability: Not on file  . Transportation needs:    Medical: Not on file    Non-medical: Not on file  Tobacco Use  . Smoking  status: Never Smoker  . Smokeless tobacco: Never Used  Substance and Sexual Activity  . Alcohol use: No    Alcohol/week: 0.0 standard drinks  . Drug use: No  . Sexual activity: Never  Lifestyle  . Physical activity:    Days per week: Not on file    Minutes per session: Not on file  . Stress: Not on file  Relationships  . Social connections:    Talks on phone: Not on file    Gets together: Not on file    Attends religious service: Not on file    Active member of club or organization: Not on file    Attends meetings of clubs or organizations: Not on file    Relationship status: Not on file  . Intimate partner violence:    Fear of current or ex partner: Not on file    Emotionally abused: Not on file    Physically abused: Not on file    Forced sexual activity: Not on file  Other Topics Concern  . Not on file  Social History Narrative   Lives alone.   Her adult children live nearby.    FAMILY HISTORY: Family History  Problem Relation Age of Onset  . Breast cancer Sister   . Hyperlipidemia Son   . Schizophrenia Mother   . Colon cancer Neg Hx   . Colon polyps Neg Hx   . Esophageal cancer Neg Hx   . Rectal cancer Neg Hx   . Stomach cancer Neg Hx     ALLERGIES:  is allergic to hctz [hydrochlorothiazide] and lipitor [atorvastatin].  MEDICATIONS:  Current Outpatient Medications  Medication Sig Dispense Refill  . amLODipine (NORVASC) 10 MG tablet Take 1 tablet (10 mg total) by mouth daily. 30 tablet 11  . Cholecalciferol (VITAMIN D-3) 5000 units TABS Take 1 tablet by mouth daily.    . famotidine (PEPCID) 20 MG tablet Take 1 tablet (20 mg total) by mouth 2 (two) times daily. (Patient not taking: Reported on 07/18/2018) 60 tablet 3  . lisinopril (PRINIVIL,ZESTRIL) 10 MG tablet Take 0.5 tablets (5 mg total) by mouth daily. 15 tablet 11  . methocarbamol (ROBAXIN) 750 MG tablet Take 1 tablet (750 mg total) by mouth 3 (three) times daily as needed for muscle spasms. Caution of  sedation (Patient not taking: Reported on 07/18/2018) 30 tablet 1  . Multiple Vitamins-Minerals (PRESERVISION AREDS 2 PO) Take by mouth.    Marland Kitchen omeprazole (PRILOSEC) 40 MG capsule Take 1 capsule (40 mg total) by mouth 2 (two) times daily before a meal. 90 capsule 3  . rosuvastatin (CRESTOR) 10 MG tablet Take 1 tablet (10 mg total) by mouth daily. Please make overdue appt with Dr. Marlou Porch before anymore refills. 2nd attempt 15 tablet 0   Current Facility-Administered Medications  Medication Dose Route Frequency Provider Last Rate Last Dose  . 0.9 %  sodium chloride infusion  500 mL Intravenous Once Nandigam, Venia Minks, MD  REVIEW OF SYSTEMS:    10 Point review of Systems was done is negative except as noted above.  PHYSICAL EXAMINATION: ECOG PERFORMANCE STATUS: 1 - Symptomatic but completely ambulatory  . Vitals:   10/01/18 1027  BP: (!) 162/91  Pulse: 78  Resp: 18  Temp: 98.1 F (36.7 C)  SpO2: 98%   Filed Weights   10/01/18 1027  Weight: 145 lb 14.4 oz (66.2 kg)   .Body mass index is 27.57 kg/m.  GENERAL:alert, in no acute distress and comfortable SKIN: no acute rashes, no significant lesions EYES: conjunctiva are pink and non-injected, sclera anicteric OROPHARYNX: MMM, no exudates, no oropharyngeal erythema or ulceration NECK: supple, no JVD LYMPH: Couple 1-2cm LNs in bilateral cervical and supraclavicular. 2-3cm LNs in bilateral axilla. No palpable lymphadenopathy in the inguinal regions. LUNGS: clear to auscultation b/l with normal respiratory effort HEART: regular rate & rhythm ABDOMEN:  normoactive bowel sounds , non tender, not distended. No palpable hepatosplenomegaly. Extremity: no pedal edema PSYCH: alert & oriented x 3 with fluent speech NEURO: no focal motor/sensory deficits  LABORATORY DATA:  I have reviewed the data as listed  . CBC Latest Ref Rng & Units 09/22/2018 07/31/2018 07/10/2018  WBC 4.0 - 10.5 K/uL 16.8(H) 19.4(H) 22.1 Repeated and verified  X2.(HH)  Hemoglobin 12.0 - 15.0 g/dL 10.9(L) 10.5(L) 11.4(L)  Hematocrit 36.0 - 46.0 % 36.7 33.8(L) 36.5  Platelets 150 - 400 K/uL 272 293 239.0    . CMP Latest Ref Rng & Units 09/22/2018 07/31/2018 07/10/2018  Glucose 70 - 99 mg/dL 88 84 95  BUN 8 - 23 mg/dL 20 14 14   Creatinine 0.44 - 1.00 mg/dL 1.06(H) 1.14(H) 1.21(H)  Sodium 135 - 145 mmol/L 141 144 142  Potassium 3.5 - 5.1 mmol/L 3.5 3.9 4.0  Chloride 98 - 111 mmol/L 108 109 107  CO2 22 - 32 mmol/L 24 26 30   Calcium 8.9 - 10.3 mg/dL 9.5 9.4 9.5  Total Protein 6.5 - 8.1 g/dL - 6.6 6.1  Total Bilirubin 0.3 - 1.2 mg/dL - <0.2(L) 0.4  Alkaline Phos 38 - 126 U/L - 52 42  AST 15 - 41 U/L - 11(L) 10  ALT 0 - 44 U/L - 10 10    09/22/18 Right Axillary LN Biopsy:    07/31/18 Molecular Pathology:     02/21/16 Biopsy:    RADIOGRAPHIC STUDIES: I have personally reviewed the radiological images as listed and agreed with the findings in the report. [place] Core Biopsy (lymph Nodes)  Result Date: 09/22/2018 INDICATION: History of small lymphocytic lymphoma, now with bulky lymphadenopathy. Please perform ultrasound-guided biopsy of bulky right axillary lymphadenopathy for tissue diagnostic purposes. EXAM: ULTRASOUND-GUIDED RIGHT AXILLARY LYMPH NODE BIOPSY. COMPARISON:  PET-CT-08/15/2018; chest CT-07/24/2018 MEDICATIONS: None ANESTHESIA/SEDATION: Moderate (conscious) sedation was employed during this procedure. A total of Versed 2 mg and Fentanyl 50 mcg was administered intravenously. Moderate Sedation Time: 10 minutes. The patient's level of consciousness and vital signs were monitored continuously by radiology nursing throughout the procedure under my direct supervision. COMPLICATIONS: None immediate. TECHNIQUE: Informed written consent was obtained from the patient after a discussion of the risks, benefits and alternatives to treatment. Questions regarding the procedure were encouraged and answered. Initial ultrasound scanning demonstrated multiple  enlarged right axillary lymph nodes. A dominant right axillary lymph node measuring approximately 2.4 x 2.2 cm was targeted for biopsy given location and sonographic window (image 12). An ultrasound image was saved for documentation purposes. The procedure was planned. A timeout was performed prior to the initiation  of the procedure. The operative was prepped and draped in the usual sterile fashion, and a sterile drape was applied covering the operative field. A timeout was performed prior to the initiation of the procedure. Local anesthesia was provided with 1% lidocaine with epinephrine. Under direct ultrasound guidance, an 18 gauge core needle device was utilized to obtain to obtain 6 core needle biopsies of the targeted dominant right axillary lymph node. The samples were placed in saline and submitted to pathology. The needle was removed and hemostasis was achieved with manual compression. Post procedure scan was negative for significant hematoma. A dressing was placed. The patient tolerated the procedure well without immediate postprocedural complication. IMPRESSION: Technically successful ultrasound guided biopsy of dominant right axillary lymph node. Electronically Signed   By: Sandi Mariscal M.D.   On: 09/22/2018 14:21    ASSESSMENT & PLAN:  73 y.o. female with  1. Chronic Lymphocytic Leukemia 02/21/16 Right Axillary LN biopsy revealed SLL, the initial diagnosis. 07/31/18 FISH CLL Prognostic Panel revealed:52.0% cells with 11q deletion  PLAN: -Discussed patient's most recent labs from 09/22/18, WBC at 16.8k, HGB at 10.9, PLT at 272k. ANC at 1.2k, Lymphs at 11.3k, Monocytes at 4.1k. -Discussed the 09/22/18 Right Axillary LN Biopsy which revealed SLL, ruling out a transformation event -Discussed the 08/15/18 PET/CT which revealed Bulky lymphadenopathy in the neck, chest, abdomen, and pelvis demonstrating low level hypermetabolism throughout. 9 mm right thyroid nodule is hypermetabolic. Thyroid ultrasound  recommended to further evaluate. Mucosal thickening with air-fluid level in the left maxillary sinus. 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 -Discussed that the patient's 11q deletion poses an intermediate risk, indicating the time to treatment is shorter than 13q deletions, but does not affect response to, and choice of, treatment like a 17p might -Discussed the indications to consider initiating treatment including cytopenias, constitutional symptoms, and threat of organ injury. -Pt is having some anemia. PLT are normal. She denies fevers, chills, night sweats. Has weight loss. Retrocrural LNsand LN around esophagus could be related to indigestion, though her hiatal hernia could also cause this. She has also had a lack of appetite, and some weaker energy levels. -Discussed that the pt does meet some criteria to consider initiating treatment -Discussed possible treatment options including combination of Bendamustine and Rituxan up to 4-6 cycles vs combination of Gazyva and Venetoclax up to 1-2 years vs Ibrutinib long term. Discussed associated risks with each treatment. -Pt will consider these options in the next 2-4 weeks -Provided supplemental information -Advised crowd avoidance and infection prevention strategies. -Will keep an eye on thyroid nodule with [place] and referral to endocrinology -Will see the pt back in 4 weeks   RTC with [name] with labs in 4 weeks. Patient to decide on treatment choice and call us for scheduling.   All of the patients questions were answered with apparent satisfaction. The patient knows to call the clinic with any problems, questions or concerns.  The total time spent in the appt was 45 minutes and more than 50% was on counseling and direct patient cares.    Sullivan Lone MD MS AAHIVMS [facility] Hematology/Oncology Physician [facility]  (Office):       [phone] (Work cell):  [phone] (Fax):           [phone]   10/01/2018 11:09 AM  I, Baldwin [place], am acting as a scribe for Dr. Sullivan Lone.   .I have reviewed the above documentation for accuracy and completeness, and I agree with the  above. .Brunetta Genera MD

## 2018-10-01 ENCOUNTER — Inpatient Hospital Stay: Payer: Medicare Other | Attending: Hematology and Oncology | Admitting: Hematology

## 2018-10-01 ENCOUNTER — Telehealth: Payer: Self-pay | Admitting: Hematology

## 2018-10-01 ENCOUNTER — Other Ambulatory Visit: Payer: Self-pay

## 2018-10-01 VITALS — BP 162/91 | HR 78 | Temp 98.1°F | Resp 18 | Ht 61.0 in | Wt 145.9 lb

## 2018-10-01 DIAGNOSIS — C9110 Chronic lymphocytic leukemia of B-cell type not having achieved remission: Secondary | ICD-10-CM | POA: Diagnosis present

## 2018-10-01 DIAGNOSIS — C91Z0 Other lymphoid leukemia not having achieved remission: Secondary | ICD-10-CM | POA: Diagnosis not present

## 2018-10-01 DIAGNOSIS — D649 Anemia, unspecified: Secondary | ICD-10-CM | POA: Insufficient documentation

## 2018-10-01 NOTE — Telephone Encounter (Signed)
Scheduled appt per 3/25 los.  

## 2018-10-29 ENCOUNTER — Inpatient Hospital Stay: Payer: Medicare Other | Attending: Hematology and Oncology

## 2018-10-29 ENCOUNTER — Inpatient Hospital Stay (HOSPITAL_BASED_OUTPATIENT_CLINIC_OR_DEPARTMENT_OTHER): Payer: Medicare Other | Admitting: Hematology

## 2018-10-29 ENCOUNTER — Telehealth: Payer: Self-pay | Admitting: Hematology

## 2018-10-29 ENCOUNTER — Other Ambulatory Visit: Payer: Self-pay

## 2018-10-29 VITALS — BP 134/82 | HR 82 | Temp 98.1°F | Resp 18 | Ht 61.0 in | Wt 152.3 lb

## 2018-10-29 DIAGNOSIS — C9110 Chronic lymphocytic leukemia of B-cell type not having achieved remission: Secondary | ICD-10-CM | POA: Diagnosis present

## 2018-10-29 DIAGNOSIS — D649 Anemia, unspecified: Secondary | ICD-10-CM | POA: Diagnosis not present

## 2018-10-29 LAB — CMP (CANCER CENTER ONLY)
ALT: 7 U/L (ref 0–44)
AST: 12 U/L — ABNORMAL LOW (ref 15–41)
Albumin: 3.7 g/dL (ref 3.5–5.0)
Alkaline Phosphatase: 66 U/L (ref 38–126)
Anion gap: 7 (ref 5–15)
BUN: 23 mg/dL (ref 8–23)
CO2: 28 mmol/L (ref 22–32)
Calcium: 9.6 mg/dL (ref 8.9–10.3)
Chloride: 108 mmol/L (ref 98–111)
Creatinine: 1.27 mg/dL — ABNORMAL HIGH (ref 0.44–1.00)
GFR, Est AFR Am: 49 mL/min — ABNORMAL LOW (ref >60)
GFR, Estimated: 42 mL/min — ABNORMAL LOW (ref >60)
Glucose, Bld: 88 mg/dL (ref 70–99)
Potassium: 4.6 mmol/L (ref 3.5–5.1)
Sodium: 143 mmol/L (ref 135–145)
Total Bilirubin: 0.3 mg/dL (ref 0.3–1.2)
Total Protein: 6.9 g/dL (ref 6.5–8.1)

## 2018-10-29 LAB — CBC WITH DIFFERENTIAL/PLATELET
Abs Immature Granulocytes: 0 10*3/uL (ref 0.00–0.07)
Basophils Absolute: 0 10*3/uL (ref 0.0–0.1)
Basophils Relative: 0 %
Eosinophils Absolute: 0.4 10*3/uL (ref 0.0–0.5)
Eosinophils Relative: 3 %
HCT: 35.1 % — ABNORMAL LOW (ref 36.0–46.0)
Hemoglobin: 10.4 g/dL — ABNORMAL LOW (ref 12.0–15.0)
Lymphocytes Relative: 83 %
Lymphs Abs: 9.9 10*3/uL — ABNORMAL HIGH (ref 0.7–4.0)
MCH: 26.9 pg (ref 26.0–34.0)
MCHC: 29.6 g/dL — ABNORMAL LOW (ref 30.0–36.0)
MCV: 90.7 fL (ref 80.0–100.0)
Monocytes Absolute: 0.2 10*3/uL (ref 0.1–1.0)
Monocytes Relative: 2 %
Neutro Abs: 1.4 10*3/uL — ABNORMAL LOW (ref 1.7–17.7)
Neutrophils Relative %: 12 %
Platelets: 245 10*3/uL (ref 150–400)
RBC: 3.87 MIL/uL (ref 3.87–5.11)
RDW: 14.2 % (ref 11.5–15.5)
WBC: 11.9 10*3/uL — ABNORMAL HIGH (ref 4.0–10.5)
nRBC: 0 % (ref 0.0–0.2)

## 2018-10-29 LAB — LACTATE DEHYDROGENASE: LDH: 254 U/L — ABNORMAL HIGH (ref 98–192)

## 2018-10-29 NOTE — Progress Notes (Signed)
HEMATOLOGY/ONCOLOGY CLINIC NOTE  Date of Service: 10/29/2018  Patient Care Team: Tower, Wynelle Fanny, MD as PCP - General (Family Medicine) Nicholas Lose, MD as Consulting Physician (Hematology and Oncology) Galvin Proffer, OD as Consulting Physician (Optometry)  CHIEF COMPLAINTS/PURPOSE OF CONSULTATION:  Chronic Lymphocytic Leukemia  HISTORY OF PRESENTING ILLNESS:   Erica Carlson is a wonderful 73 y.o. female who has been referred to [place] by my colleague Dr. 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 The pt reports that she is doing well overall.   The pt reports that her SLL was initially picked up tthrough a mammogram in 2017. She notes that her CLL/SLL was stable until December 2019. She notes that she began having feelings of indigestion when she eats. She denies issues with acid reflux. The pt notes that her indigestion began making it difficult to eat, but also endorses a weakened appetite. She lost about 25 pounds, but has gained back 5 pounds. The pt had an unrevealing endoscopy on 07/18/18. She notes that she has some difficulty eating meats. She is staying well hydrated. She has experimented with eating different foods. She endorses having a "lot of gas," and puts a teaspoon of her Mylanta in her coffee every morning.  The pt denies any fevers, chills, or night sweats. She has noticed some lumps in her bilateral neck and armpits. She denies other abdominal pains as such. She denies problems passing urine, nor increased urinary frequency. She denies CP or SOB. She denies leg swelling.   Denies liver, kidney, heart, or lung problems and endorses being generally pretty healthy.  Of note prior to the patient's visit today, pt has had a PET/CT completed on 08/15/18 with results revealing Bulky lymphadenopathy in the neck, chest, abdomen, and pelvis demonstrating low level hypermetabolism throughout. 9 mm right thyroid nodule is  hypermetabolic. Thyroid ultrasound recommended to further evaluate. Mucosal thickening with air-fluid level in the left maxillary sinus. 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  Most recent lab results (09/22/18) of CBC w/diff and BMP is as follows: all values are WNL except for WBC at 16.8k, HGB at 10.9, MCHC at 29.7, ANC at 1.2k, Lymphs abs at 11.3k, Monocytes abs at 4.1k, Creatinine at 1.06.  On review of systems, pt reports frequent indigestion, weakened appetite, weight loss, weaker energy levels, bilateral neck bumps, bilateral armpit bumps, and denies CP, SOB, abdominal pain, leg swelling, fevers, chills, night sweats, lower abdominal pains, changes in bowel habits, and any other symptoms.  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 The patient's last visit with [place] was on 10/01/18. The pt reports that she is doing well overall.  The pt reports that she has not developed any new symptoms in the interim. She notes that she does continue to feel that "sometimes her food gets stuck," which resolves after a moment. She senses her food getting stuck in the middle of her upper chest. She denies fevers, chills, night sweats, or fatigue. She endorses good energy levels.  The pt notes that she senses possible growth in her axillary lymph nodes, which she notices when she puts on her bra. She denies this affecting her quality of life or having any deleterious impacts on her quality of life at this time.  Lab results today (10/29/18) of CBC w/diff and CMP is  as follows: all values are WNL except for WBC at 11.9k, HGB at 10.4, HCT at 35.1, MCHC at 29.6, ANC at 1.4k, Lymphs abs at 9.9k, Creatinine at 1.27 AST at 12, GFR at 49. 10/29/18 LDH is pending  On review of systems, pt reports stable swallowing, good energy levels, enlarged lymph nodes in  armpit, and denies fevers, chills, night sweats, leg swelling, SOB, CP, abdominal pains, changes in bowel habits, fatigue, and any other symptoms.    MEDICAL HISTORY:  Past Medical History:  Diagnosis Date  . Allergy    spring  . Arthritis    osteo arthritis of right knee  . Cataract    forming  . Hyperlipidemia   . Hypertension   . 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 Pneumonia     SURGICAL HISTORY: Past Surgical History:  Procedure Laterality Date  . ABDOMINAL HYSTERECTOMY    . AXILLARY LYMPH NODE BIOPSY Right 02/07/2016  . back injections    . BREAST SURGERY    . COLONOSCOPY  2009  . EYE SURGERY     retinal tear repair  . KNEE SURGERY     torn ALC per pt    SOCIAL HISTORY: Social History   Socioeconomic History  . Marital status: Widowed    Spouse name: n/a  . Number of children: 6  . Years of education: 69  . Highest education level: Not on file  Occupational History  . 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 Financial resource strain: Not on file  . Food insecurity:    Worry: Not on file    Inability: Not on file  . Transportation needs:    Medical: Not on file    Non-medical: Not on file  Tobacco Use  . Smoking status: Never Smoker  . Smokeless tobacco: Never Used  Substance and Sexual Activity  . Alcohol use: No    Alcohol/week: 0.0 standard drinks  . Drug use: No  . Sexual activity: Never  Lifestyle  . Physical activity:    Days per week: Not on file    Minutes per session: Not on file  . Stress: Not on file  Relationships  . Social connections:    Talks on phone: Not on file    Gets together: Not on file    Attends religious service: Not on file    Active member of club or organization: Not on file    Attends meetings of clubs or organizations: Not on file    Relationship status: Not on file  . Intimate partner violence:    Fear of current or ex  partner: Not on file    Emotionally abused: Not on file    Physically abused: Not on file    Forced sexual activity: Not on file  Other Topics Concern  . Not on file  Social History Narrative   Lives alone.   Her adult children live nearby.    FAMILY HISTORY: Family History  Problem Relation Age of Onset  . Breast cancer Sister   . Hyperlipidemia Son   . Schizophrenia Mother   . Colon cancer Neg Hx   . Colon polyps Neg Hx   . Esophageal cancer Neg Hx   . Rectal cancer Neg Hx   . Stomach cancer Neg Hx     ALLERGIES:  is allergic to hctz [hydrochlorothiazide] and lipitor [atorvastatin].  MEDICATIONS:  Current Outpatient  Medications  Medication Sig Dispense Refill  . amLODipine (NORVASC) 10 MG tablet Take 1 tablet (10 mg total) by mouth daily. 30 tablet 11  . Cholecalciferol (VITAMIN D-3) 5000 units TABS Take 1 tablet by mouth daily.    . famotidine (PEPCID) 20 MG tablet Take 1 tablet (20 mg total) by mouth 2 (two) times daily. (Patient not taking: Reported on 07/18/2018) 60 tablet 3  . lisinopril (PRINIVIL,ZESTRIL) 10 MG tablet Take 0.5 tablets (5 mg total) by mouth daily. 15 tablet 11  . methocarbamol (ROBAXIN) 750 MG tablet Take 1 tablet (750 mg total) by mouth 3 (three) times daily as needed for muscle spasms. Caution of sedation (Patient not taking: Reported on 07/18/2018) 30 tablet 1  . Multiple Vitamins-Minerals (PRESERVISION AREDS 2 PO) Take by mouth.    Marland Kitchen omeprazole (PRILOSEC) 40 MG capsule Take 1 capsule (40 mg total) by mouth 2 (two) times daily before a meal. 90 capsule 3  . rosuvastatin (CRESTOR) 10 MG tablet Take 1 tablet (10 mg total) by mouth daily. Please make overdue appt with Dr. Marlou Porch before anymore refills. 2nd attempt 15 tablet 0   Current Facility-Administered Medications  Medication Dose Route Frequency Provider Last Rate Last Dose  . 0.9 %  sodium chloride infusion  500 mL Intravenous Once Nandigam, Venia Minks, MD        REVIEW OF SYSTEMS:    A 10+  POINT REVIEW OF SYSTEMS WAS OBTAINED including neurology, dermatology, psychiatry, cardiac, respiratory, lymph, extremities, GI, GU, Musculoskeletal, constitutional, breasts, reproductive, HEENT.  All pertinent positives are noted in the HPI.  All others are negative.   PHYSICAL EXAMINATION: ECOG PERFORMANCE STATUS: 1 - Symptomatic but completely ambulatory  . Vitals:   10/29/18 1523  BP: 134/82  Pulse: 82  Resp: 18  Temp: 98.1 F (36.7 C)  SpO2: 99%   Filed Weights   10/29/18 1523  Weight: 152 lb 4.8 oz (69.1 kg)   .Body mass index is 28.78 kg/m.  GENERAL:alert, in no acute distress and comfortable SKIN: no acute rashes, no significant lesions EYES: conjunctiva are pink and non-injected, sclera anicteric OROPHARYNX: MMM, no exudates, no oropharyngeal erythema or ulceration NECK: supple, no JVD LYMPH:  Couple 1-2cm LNs in b/l cervical and supraclavicular. Unchanged 2-3cm LNs in b/l axilla. No palpable lymphadenopathy in the inguinal regions. LUNGS: clear to auscultation b/l with normal respiratory effort HEART: regular rate & rhythm ABDOMEN:  normoactive bowel sounds , non tender, not distended. No palpable hepatosplenomegaly.  Extremity: no pedal edema PSYCH: alert & oriented x 3 with fluent speech NEURO: no focal motor/sensory deficits   LABORATORY DATA:  I have reviewed the data as listed  . CBC Latest Ref Rng & Units 10/29/2018 09/22/2018 07/31/2018  WBC 4.0 - 10.5 K/uL 11.9(H) 16.8(H) 19.4(H)  Hemoglobin 12.0 - 15.0 g/dL 10.4(L) 10.9(L) 10.5(L)  Hematocrit 36.0 - 46.0 % 35.1(L) 36.7 33.8(L)  Platelets 150 - 400 K/uL 245 272 293    . CMP Latest Ref Rng & Units 10/29/2018 09/22/2018 07/31/2018  Glucose 70 - 99 mg/dL 88 88 84  BUN 8 - 23 mg/dL 23 20 14   Creatinine 0.44 - 1.00 mg/dL 1.27(H) 1.06(H) 1.14(H)  Sodium 135 - 145 mmol/L 143 141 144  Potassium 3.5 - 5.1 mmol/L 4.6 3.5 3.9  Chloride 98 - 111 mmol/L 108 108 109  CO2 22 - 32 mmol/L 28 24 26   Calcium 8.9 - 10.3  mg/dL 9.6 9.5 9.4  Total Protein 6.5 - 8.1 g/dL 6.9 - 6.6  Total  Bilirubin 0.3 - 1.2 mg/dL 0.3 - <0.2(L)  Alkaline Phos 38 - 126 U/L 66 - 52  AST 15 - 41 U/L 12(L) - 11(L)  ALT 0 - 44 U/L 7 - 10    09/22/18 Right Axillary LN Biopsy:    07/31/18 Molecular Pathology:     02/21/16 Biopsy:    RADIOGRAPHIC STUDIES: I have personally reviewed the radiological images as listed and agreed with the findings in the report. No results found.  ASSESSMENT & PLAN:  73 y.o. female with  1. Chronic Lymphocytic Leukemia 02/21/16 Right Axillary LN biopsy revealed SLL, the initial diagnosis. 07/31/18 FISH CLL Prognostic Panel revealed:52.0% cells with 11q deletion Labs upon initial presentation from 09/22/18, WBC at 16.8k, HGB at 10.9, PLT at 272k. ANC at 1.2k, Lymphs at 11.3k, Monocytes at 4.1k. 09/22/18 Right Axillary LN Biopsy revealed SLL, ruling out a transformation event 08/15/18 PET/CT revealed Bulky lymphadenopathy in the neck, chest, abdomen, and pelvis demonstrating low level hypermetabolism throughout. 9 mm right thyroid nodule is hypermetabolic. Thyroid ultrasound recommended to further evaluate. Mucosal thickening with air-fluid level in the left maxillary sinus. 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  PLAN: -Discussed pt labwork today, 10/29/18; WBC improved to 11.9k with Lymphs improved to 9.9k. HGB stable at 10.4. PLT normal. Chemistries stable.  -Discussed again the indications to consider initiating treatment including cytopenias, constitutional symptoms and threat of organ injury -Pt is having some anemia. PLT are normal. She denies fevers, chills, night sweats. Has weight loss. Retrocrural LNs and LN around esophagus could be related to indigestion, though her hiatal hernia could also cause this. She has also had a lack of appetite, and some weaker energy levels. -Discussed that the pt does meet some criteria to consider initiating treatment -Discussed possible  treatment options including combination of Bendamustine and Rituxan up to 4-6 cycles vs combination of Gazyva and Venetoclax up to 1-2 years vs Ibrutinib long term. Discussed associated risks with each treatment. -Pt notes that after discussing the treatment options, she would like to pursue watchful observation, and notes that she is enjoying a good quality of life at this time. -Advised crowd avoidance and infection prevention strategies. -Will keep an eye on thyroid nodule with [place] and referral to endocrinology -Will see the pt back in 3 months   RTC with [name] with labs in 60months   All of the patients questions were answered with apparent satisfaction. The patient knows to call the clinic with any problems, questions or concerns.  The total time spent in the appt was 20 minutes and more than 50% was on counseling and direct patient cares.    Sullivan Lone MD MS AAHIVMS [facility] Hematology/Oncology Physician [facility]  (Office):       [phone] (Work cell):  [phone] (Fax):           [phone]  10/29/2018 3:59 PM  I, Baldwin [place], am acting as a scribe for Dr. Sullivan Lone.   .I have reviewed the above documentation for accuracy and completeness, and I agree with the above. Brunetta Genera MD

## 2018-10-29 NOTE — Telephone Encounter (Signed)
Scheduled appt per 4/22 los. °

## 2019-01-28 ENCOUNTER — Inpatient Hospital Stay: Payer: Medicare Other | Attending: Hematology

## 2019-01-28 ENCOUNTER — Other Ambulatory Visit: Payer: Self-pay

## 2019-01-28 ENCOUNTER — Telehealth: Payer: Self-pay | Admitting: Hematology

## 2019-01-28 ENCOUNTER — Inpatient Hospital Stay: Payer: Medicare Other | Admitting: Hematology

## 2019-01-28 VITALS — BP 159/96 | HR 83 | Temp 98.3°F | Resp 18 | Ht 61.0 in | Wt 155.2 lb

## 2019-01-28 DIAGNOSIS — C8300 Small cell B-cell lymphoma, unspecified site: Secondary | ICD-10-CM

## 2019-01-28 DIAGNOSIS — C9110 Chronic lymphocytic leukemia of B-cell type not having achieved remission: Secondary | ICD-10-CM | POA: Diagnosis not present

## 2019-01-28 LAB — CMP (CANCER CENTER ONLY)
ALT: 12 U/L (ref 0–44)
AST: 18 U/L (ref 15–41)
Albumin: 4.3 g/dL (ref 3.5–5.0)
Alkaline Phosphatase: 73 U/L (ref 38–126)
Anion gap: 9 (ref 5–15)
BUN: 23 mg/dL (ref 8–23)
CO2: 25 mmol/L (ref 22–32)
Calcium: 9.9 mg/dL (ref 8.9–10.3)
Chloride: 109 mmol/L (ref 98–111)
Creatinine: 1.14 mg/dL — ABNORMAL HIGH (ref 0.44–1.00)
GFR, Est AFR Am: 56 mL/min — ABNORMAL LOW (ref >60)
GFR, Estimated: 48 mL/min — ABNORMAL LOW (ref >60)
Glucose, Bld: 88 mg/dL (ref 70–99)
Potassium: 4.4 mmol/L (ref 3.5–5.1)
Sodium: 143 mmol/L (ref 135–145)
Total Bilirubin: 0.4 mg/dL (ref 0.3–1.2)
Total Protein: 7.3 g/dL (ref 6.5–8.1)

## 2019-01-28 LAB — CBC WITH DIFFERENTIAL/PLATELET
Abs Immature Granulocytes: 0.04 10*3/uL (ref 0.00–0.07)
Basophils Absolute: 0 10*3/uL (ref 0.0–0.1)
Basophils Relative: 0 %
Eosinophils Absolute: 0.2 10*3/uL (ref 0.0–0.5)
Eosinophils Relative: 2 %
HCT: 37.4 % (ref 36.0–46.0)
Hemoglobin: 11.5 g/dL — ABNORMAL LOW (ref 12.0–15.0)
Immature Granulocytes: 0 %
Lymphocytes Relative: 59 %
Lymphs Abs: 6.9 10*3/uL — ABNORMAL HIGH (ref 0.7–4.0)
MCH: 26.7 pg (ref 26.0–34.0)
MCHC: 30.7 g/dL (ref 30.0–36.0)
MCV: 86.8 fL (ref 80.0–100.0)
Monocytes Absolute: 3.3 10*3/uL — ABNORMAL HIGH (ref 0.1–1.0)
Monocytes Relative: 28 %
Neutro Abs: 1.2 10*3/uL — ABNORMAL LOW (ref 1.7–7.7)
Neutrophils Relative %: 11 %
Platelets: 256 10*3/uL (ref 150–400)
RBC: 4.31 MIL/uL (ref 3.87–5.11)
RDW: 13.9 % (ref 11.5–15.5)
WBC: 11.7 10*3/uL — ABNORMAL HIGH (ref 4.0–10.5)
nRBC: 0 % (ref 0.0–0.2)

## 2019-01-28 LAB — LACTATE DEHYDROGENASE: LDH: 377 U/L — ABNORMAL HIGH (ref 98–192)

## 2019-01-28 NOTE — Progress Notes (Signed)
HEMATOLOGY/ONCOLOGY CLINIC NOTE  Date of Service: 01/28/2019  Patient Care Team: Tower, Wynelle Fanny, MD as PCP - General (Family Medicine) Galvin Proffer, [place] as Consulting Physician (Optometry) Brunetta Genera, MD as Consulting Physician (Hematology)  CHIEF COMPLAINTS/PURPOSE OF CONSULTATION:  Chronic Lymphocytic Leukemia  HISTORY OF PRESENTING ILLNESS:   Erica Carlson is a wonderful 73 y.o. female who has been referred to [place] by my colleague Dr. 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 The pt reports that she is doing well overall.   The pt reports that her SLL was initially picked up tthrough a mammogram in 2017. She notes that her CLL/SLL was stable until December 2019. She notes that she began having feelings of indigestion when she eats. She denies issues with acid reflux. The pt notes that her indigestion began making it difficult to eat, but also endorses a weakened appetite. She lost about 25 pounds, but has gained back 5 pounds. The pt had an unrevealing endoscopy on 07/18/18. She notes that she has some difficulty eating meats. She is staying well hydrated. She has experimented with eating different foods. She endorses having a "lot of gas," and puts a teaspoon of her Mylanta in her coffee every morning.  The pt denies any fevers, chills, or night sweats. She has noticed some lumps in her bilateral neck and armpits. She denies other abdominal pains as such. She denies problems passing urine, nor increased urinary frequency. She denies CP or SOB. She denies leg swelling.   Denies liver, kidney, heart, or lung problems and endorses being generally pretty healthy.  Of note prior to the patient's visit today, pt has had a PET/CT completed on 08/15/18 with results revealing Bulky lymphadenopathy in the neck, chest, abdomen, and pelvis demonstrating low level hypermetabolism throughout. 9 mm right thyroid nodule is hypermetabolic.  Thyroid ultrasound recommended to further evaluate. Mucosal thickening with air-fluid level in the left maxillary sinus. 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  Most recent lab results (09/22/18) of CBC w/diff and BMP is as follows: all values are WNL except for WBC at 16.8k, HGB at 10.9, MCHC at 29.7, ANC at 1.2k, Lymphs abs at 11.3k, Monocytes abs at 4.1k, Creatinine at 1.06.  On review of systems, pt reports frequent indigestion, weakened appetite, weight loss, weaker energy levels, bilateral neck bumps, bilateral armpit bumps, and denies CP, SOB, abdominal pain, leg swelling, fevers, chills, night sweats, lower abdominal pains, changes in bowel habits, and any other symptoms.  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 The patient's last visit with [place] was on 10/29/2018. The pt reports that she is doing well overall.  The pt reports that she has become more aware of her lymph nodes, particularly in her axilla and neck.   Lab results today (01/28/19) of CBC w/diff and CMP is as follows: all values are WNL except for WBC at 11.7, hemoglobin at 11.5, neutro abs at 1.2K, lymphs abs at 6.9K, monocytes absolute at 3.3, creatinine at 1.14, GFR est non af at 48, GFR AFR at 56.  On review of systems, pt denies abdominal pain, mouth sores and any other symptoms.    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    Pneumonia     SURGICAL HISTORY: Past Surgical History:  Procedure Laterality Date   ABDOMINAL HYSTERECTOMY     AXILLARY LYMPH NODE BIOPSY Right 02/07/2016   back injections     BREAST SURGERY     COLONOSCOPY  2009   EYE  SURGERY     retinal tear repair   KNEE SURGERY     torn [facility]C per pt    SOCIAL HISTORY: Social History   Socioeconomic History   Marital status: Widowed    Spouse name: n/a   Number of children: 6   Years of education: 14   Highest education level: Not on file  Occupational History   Occupation: self-employed    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 strain: Not on file   Food insecurity    Worry: Not on file    Inability: Not on file   Transportation needs    Medical: Not on file    Non-medical: Not on file  Tobacco Use   Smoking status: Never Smoker   Smokeless tobacco: Never Used  Substance and Sexual Activity   Alcohol use: No    Alcohol/week: 0.0 standard drinks   Drug use: No   Sexual activity: Never  Lifestyle   Physical activity    Days per week: Not on file    Minutes per session: Not on file   Stress: Not on file  Relationships   Social connections    Talks on phone: Not on file    Gets together: Not on file    Attends religious service: Not on file    Active member of club or organization: Not on file    Attends meetings of clubs or organizations: Not on file    Relationship status: Not on file   Intimate partner violence    Fear of current or ex partner: Not on file    Emotionally abused: Not on file    Physically abused: Not on file    Forced sexual activity: Not on file  Other Topics Concern   Not on file  Social History Narrative   Lives alone.   Her adult children live nearby.    FAMILY HISTORY: Family History  Problem Relation Age of Onset   Breast cancer Sister    Hyperlipidemia Son    Schizophrenia Mother    Colon cancer Neg Hx    Colon polyps Neg Hx    Esophageal cancer Neg Hx    Rectal cancer Neg Hx    Stomach cancer Neg Hx     ALLERGIES:  is allergic to hctz [hydrochlorothiazide] and lipitor [atorvastatin].  MEDICATIONS:  Current  Outpatient Medications  Medication Sig Dispense Refill   amLODipine (NORVASC) 10 MG tablet Take 1 tablet (10 mg total) by mouth daily. 30 tablet 11   Cholecalciferol (VITAMIN D-3) 5000 units TABS Take 1 tablet by mouth daily.     famotidine (PEPCID) 20 MG tablet Take 1 tablet (20 mg total) by mouth 2 (two) times daily. (Patient not taking: Reported on 07/18/2018) 60 tablet 3   lisinopril (PRINIVIL,ZESTRIL) 10 MG tablet Take 0.5 tablets (5 mg total) by mouth daily. 15 tablet 11   methocarbamol (ROBAXIN) 750 MG tablet Take 1 tablet (750 mg total) by mouth 3 (three) times daily as needed for  muscle spasms. Caution of sedation (Patient not taking: Reported on 07/18/2018) 30 tablet 1   Multiple Vitamins-Minerals (PRESERVISION AREDS 2 PO) Take by mouth.     omeprazole (PRILOSEC) 40 MG capsule Take 1 capsule (40 mg total) by mouth 2 (two) times daily before a meal. 90 capsule 3   rosuvastatin (CRESTOR) 10 MG tablet Take 1 tablet (10 mg total) by mouth daily. Please make overdue appt with Dr. Marlou Porch before anymore refills. 2nd attempt 15 tablet 0   Current Facility-Administered Medications  Medication Dose Route Frequency Provider Last Rate Last Dose   0.9 %  sodium chloride infusion  500 mL Intravenous Once Nandigam, Venia Minks, MD        REVIEW OF SYSTEMS:   A 10+ POINT REVIEW OF SYSTEMS WAS OBTAINED including neurology, dermatology, psychiatry, cardiac, respiratory, lymph, extremities, GI, GU, Musculoskeletal, constitutional, breasts, reproductive, HEENT.  All pertinent positives are noted in the HPI.  All others are negative.    PHYSICAL EXAMINATION: ECOG PERFORMANCE STATUS: 1 - Symptomatic but completely ambulatory   Vitals:   01/28/19 1420  BP: (!) 159/96  Pulse: 83  Resp: 18  Temp: 98.3 F (36.8 C)  SpO2: 99%   Filed Weights   01/28/19 1420  Weight: 155 lb 3.2 oz (70.4 kg)   Body mass index is 29.32 kg/m.  GENERAL:alert, in no acute distress and comfortable SKIN: no acute  rashes, no significant lesions EYES: conjunctiva are pink and non-injected, sclera anicteric OROPHARYNX: MMM, no exudates, no oropharyngeal erythema or ulceration NECK: supple, no JVD LYMPH:  Couple 1-2cm LNs in b/l cervical and supraclavicular. Unchanged 2-3cm LNs in b/l axilla. No palpable lymphadenopathy in the inguinal regions. LUNGS: clear to auscultation b/l with normal respiratory effort HEART: regular rate & rhythm ABDOMEN:  normoactive bowel sounds , non tender, not distended. Barely palpable Splenomegaly.  Extremity: no pedal edema PSYCH: alert & oriented x 3 with fluent speech NEURO: no focal motor/sensory deficits   LABORATORY DATA:  I have reviewed the data as listed  . CBC Latest Ref Rng & Units 01/28/2019 10/29/2018 09/22/2018  WBC 4.0 - 10.5 K/uL 11.7(H) 11.9(H) 16.8(H)  Hemoglobin 12.0 - 15.0 g/dL 11.5(L) 10.4(L) 10.9(L)  Hematocrit 36.0 - 46.0 % 37.4 35.1(L) 36.7  Platelets 150 - 400 K/uL 256 245 272    . CMP Latest Ref Rng & Units 01/28/2019 10/29/2018 09/22/2018  Glucose 70 - 99 mg/dL 88 88 88  BUN 8 - 23 mg/dL 23 23 20   Creatinine 0.44 - 1.00 mg/dL 1.14(H) 1.27(H) 1.06(H)  Sodium 135 - 145 mmol/L 143 143 141  Potassium 3.5 - 5.1 mmol/L 4.4 4.6 3.5  Chloride 98 - 111 mmol/L 109 108 108  CO2 22 - 32 mmol/L 25 28 24   Calcium 8.9 - 10.3 mg/dL 9.9 9.6 9.5  Total Protein 6.5 - 8.1 g/dL 7.3 6.9 -  Total Bilirubin 0.3 - 1.2 mg/dL 0.4 0.3 -  Alkaline Phos 38 - 126 U/L 73 66 -  AST 15 - 41 U/L 18 12(L) -  ALT 0 - 44 U/L 12 7 -    09/22/18 Right Axillary LN Biopsy:    07/31/18 Molecular Pathology:     02/21/16 Biopsy:    RADIOGRAPHIC STUDIES: I have personally reviewed the radiological images as listed and agreed with the findings in the report. No results found.  ASSESSMENT & PLAN:  73 y.o. female with  1. Chronic Lymphocytic Leukemia  02/21/16 Right Axillary LN biopsy revealed SLL, the initial diagnosis. 07/31/18 FISH CLL Prognostic  Panel revealed:52.0%  cells with 11q deletion Labs upon initial presentation from 09/22/18, WBC at 16.8k, HGB at 10.9, PLT at 272k. ANC at 1.2k, Lymphs at 11.3k, Monocytes at 4.1k. 09/22/18 Right Axillary LN Biopsy revealed SLL, ruling out a transformation event  08/15/18 PET/CT revealed Bulky lymphadenopathy in the neck, chest, abdomen, and pelvis demonstrating low level hypermetabolism throughout. 9 mm right thyroid nodule is hypermetabolic. Thyroid ultrasound recommended to further evaluate. Mucosal thickening with air-fluid level in the left maxillary sinus. 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   PLAN: -Discussed pt labwork today, 01/28/19; all values are WNL except for WBC at 11.7, hemoglobin at 11.5, neutro abs at 1.2K, lymphs abs at 6.9K, monocytes absolute at 3.3, creatinine at 1.14, GFR est non af at 48, GFR AFR at 56. -no lab or clinical evidence of significant progression of CLL requiring treatment consideration at this time.  FOLLOW UP: RTC with [name] with labs in 4 months   All of the patients questions were answered with apparent satisfaction. The patient knows to call the clinic with any problems, questions or concerns.  The total time spent in the appt was 15 minutes and more than 50% was on counseling and direct patient cares.     Sullivan Lone MD MS AAHIVMS [facility] Hematology/Oncology Physician [facility]  (Office):       [phone] (Work cell):  [phone] (Fax):           [phone]  01/28/2019 2:57 PM  I, Jacqualyn Posey, am acting as a Education administrator for Dr. Sullivan Lone.   .I have reviewed the above documentation for accuracy and completeness, and I agree with the above. Brunetta Genera MD

## 2019-01-28 NOTE — Telephone Encounter (Signed)
Scheduled per los. Patient declined printout  

## 2019-02-26 ENCOUNTER — Telehealth: Payer: Self-pay | Admitting: Hematology

## 2019-02-26 NOTE — Telephone Encounter (Signed)
Cancelled appt per 8/18 sch message - pt is aware

## 2019-03-02 ENCOUNTER — Other Ambulatory Visit: Payer: Medicare Other

## 2019-03-03 ENCOUNTER — Ambulatory Visit: Payer: Medicare Other | Admitting: Hematology and Oncology

## 2019-04-21 ENCOUNTER — Ambulatory Visit (INDEPENDENT_AMBULATORY_CARE_PROVIDER_SITE_OTHER): Payer: Medicare Other

## 2019-04-21 DIAGNOSIS — Z23 Encounter for immunization: Secondary | ICD-10-CM

## 2019-05-27 ENCOUNTER — Telehealth: Payer: Self-pay | Admitting: Hematology

## 2019-05-27 NOTE — Telephone Encounter (Signed)
[place] [facility] 11/23 lab/fu moved to 12/823 per GK. Left message for patient. Schedule mailed.

## 2019-06-01 ENCOUNTER — Ambulatory Visit: Payer: Medicare Other | Admitting: Hematology

## 2019-06-01 ENCOUNTER — Other Ambulatory Visit: Payer: Medicare Other

## 2019-07-01 ENCOUNTER — Inpatient Hospital Stay: Payer: Medicare Other | Admitting: Hematology

## 2019-07-01 ENCOUNTER — Encounter: Payer: Self-pay | Admitting: Hematology

## 2019-07-01 ENCOUNTER — Other Ambulatory Visit: Payer: Self-pay

## 2019-07-01 ENCOUNTER — Inpatient Hospital Stay: Payer: Medicare Other | Attending: Hematology

## 2019-07-01 VITALS — BP 162/88 | HR 86 | Temp 98.2°F | Resp 18 | Ht 61.0 in | Wt 152.0 lb

## 2019-07-01 DIAGNOSIS — C8300 Small cell B-cell lymphoma, unspecified site: Secondary | ICD-10-CM

## 2019-07-01 DIAGNOSIS — R221 Localized swelling, mass and lump, neck: Secondary | ICD-10-CM | POA: Diagnosis not present

## 2019-07-01 DIAGNOSIS — M2548 Effusion, other site: Secondary | ICD-10-CM | POA: Diagnosis not present

## 2019-07-01 LAB — CBC WITH DIFFERENTIAL/PLATELET
Abs Immature Granulocytes: 0 10*3/uL (ref 0.00–0.07)
Basophils Absolute: 0 10*3/uL (ref 0.0–0.1)
Basophils Relative: 0 %
Eosinophils Absolute: 0.8 10*3/uL — ABNORMAL HIGH (ref 0.0–0.5)
Eosinophils Relative: 3 %
HCT: 35.9 % — ABNORMAL LOW (ref 36.0–46.0)
Hemoglobin: 11 g/dL — ABNORMAL LOW (ref 12.0–15.0)
Lymphocytes Relative: 93 %
Lymphs Abs: 23.4 10*3/uL — ABNORMAL HIGH (ref 0.7–4.0)
MCH: 27.4 pg (ref 26.0–34.0)
MCHC: 30.6 g/dL (ref 30.0–36.0)
MCV: 89.3 fL (ref 80.0–100.0)
Monocytes Absolute: 0.3 10*3/uL (ref 0.1–1.0)
Monocytes Relative: 1 %
Neutro Abs: 0.8 10*3/uL — ABNORMAL LOW (ref 1.7–7.7)
Neutrophils Relative %: 3 %
Platelets: 242 10*3/uL (ref 150–400)
RBC: 4.02 MIL/uL (ref 3.87–5.11)
RDW: 14.6 % (ref 11.5–15.5)
WBC: 25.2 10*3/uL — ABNORMAL HIGH (ref 4.0–10.5)
nRBC: 0 % (ref 0.0–0.2)

## 2019-07-01 LAB — CMP (CANCER CENTER ONLY)
ALT: 12 U/L (ref 0–44)
AST: 19 U/L (ref 15–41)
Albumin: 4.3 g/dL (ref 3.5–5.0)
Alkaline Phosphatase: 65 U/L (ref 38–126)
Anion gap: 13 (ref 5–15)
BUN: 26 mg/dL — ABNORMAL HIGH (ref 8–23)
CO2: 23 mmol/L (ref 22–32)
Calcium: 9.5 mg/dL (ref 8.9–10.3)
Chloride: 107 mmol/L (ref 98–111)
Creatinine: 1.11 mg/dL — ABNORMAL HIGH (ref 0.44–1.00)
GFR, Est AFR Am: 57 mL/min — ABNORMAL LOW (ref >60)
GFR, Estimated: 50 mL/min — ABNORMAL LOW (ref >60)
Glucose, Bld: 83 mg/dL (ref 70–99)
Potassium: 4.3 mmol/L (ref 3.5–5.1)
Sodium: 143 mmol/L (ref 135–145)
Total Bilirubin: 0.5 mg/dL (ref 0.3–1.2)
Total Protein: 7.3 g/dL (ref 6.5–8.1)

## 2019-07-01 LAB — LACTATE DEHYDROGENASE: LDH: 429 U/L — ABNORMAL HIGH (ref 98–192)

## 2019-07-01 NOTE — Progress Notes (Signed)
HEMATOLOGY/ONCOLOGY CLINIC NOTE  Date of Service: 07/01/2019  Patient Care Team: Tower, Wynelle Fanny, MD as PCP - General (Family Medicine) Galvin Proffer, [place] as Consulting Physician (Optometry) Brunetta Genera, MD as Consulting Physician (Hematology)  CHIEF COMPLAINTS/PURPOSE OF CONSULTATION:  Chronic Lymphocytic Leukemia  HISTORY OF PRESENTING ILLNESS:   Erica Carlson is a wonderful 73 y.o. female who has been referred to [place] by my colleague Dr. 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 The pt reports that she is doing well overall.   The pt reports that her SLL was initially picked up tthrough a mammogram in 2017. She notes that her CLL/SLL was stable until December 2019. She notes that she began having feelings of indigestion when she eats. She denies issues with acid reflux. The pt notes that her indigestion began making it difficult to eat, but also endorses a weakened appetite. She lost about 25 pounds, but has gained back 5 pounds. The pt had an unrevealing endoscopy on 07/18/18. She notes that she has some difficulty eating meats. She is staying well hydrated. She has experimented with eating different foods. She endorses having a "lot of gas," and puts a teaspoon of her Mylanta in her coffee every morning.  The pt denies any fevers, chills, or night sweats. She has noticed some lumps in her bilateral neck and armpits. She denies other abdominal pains as such. She denies problems passing urine, nor increased urinary frequency. She denies CP or SOB. She denies leg swelling.   Denies liver, kidney, heart, or lung problems and endorses being generally pretty healthy.  Of note prior to the patient's visit today, pt has had a PET/CT completed on 08/15/18 with results revealing Bulky lymphadenopathy in the neck, chest, abdomen, and pelvis demonstrating low level hypermetabolism throughout. 9 mm right thyroid nodule is hypermetabolic.  Thyroid ultrasound recommended to further evaluate. Mucosal thickening with air-fluid level in the left maxillary sinus. 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  Most recent lab results (09/22/18) of CBC w/diff and BMP is as follows: all values are WNL except for WBC at 16.8k, HGB at 10.9, MCHC at 29.7, ANC at 1.2k, Lymphs abs at 11.3k, Monocytes abs at 4.1k, Creatinine at 1.06.  On review of systems, pt reports frequent indigestion, weakened appetite, weight loss, weaker energy levels, bilateral neck bumps, bilateral armpit bumps, and denies CP, SOB, abdominal pain, leg swelling, fevers, chills, night sweats, lower abdominal pains, changes in bowel habits, and any other symptoms.  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 The patient's last visit with [place] was on 01/28/2019. The pt reports that she is doing well overall.  The pt reports increase swelling in right side of neck and right side of collar bone. She had some swelling on the right side of her jaw.  Lab results today (07/01/19) of CBC w/diff and CMP is as follows: all values are WNL except for WBC at 25.2, Hemoglobin at 11.0, hematocrit at 35, Neutro Abs at 0.8, Bun at 26, Creatinine at 1.11, GFR Est Non Af Am at 50, GFR Est AFR AM at 57  On review of systems, pt reports nausea when she does not eat and denies  night sweats, fevers, chills, sudden weight loss, changes in energy levels, leg swelling, abdominal pain and any other symptoms.  MEDICAL HISTORY:  Past Medical History:  Diagnosis Date  . Allergy    spring  . Arthritis    osteo arthritis of right knee  . Cataract    forming  . Hyperlipidemia   . Hypertension   . 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 Pneumonia     SURGICAL HISTORY: Past Surgical History:  Procedure Laterality Date  .  ABDOMINAL HYSTERECTOMY    . AXILLARY LYMPH NODE BIOPSY Right 02/07/2016  . back injections    . BREAST SURGERY    . COLONOSCOPY  2009  . EYE SURGERY     retinal tear repair  . KNEE SURGERY     torn ALC per pt    SOCIAL HISTORY: Social History   Socioeconomic History  . Marital status: Widowed    Spouse name: n/a  . Number of children: 6  . Years of education: 40  . Highest education level: Not on file  Occupational History  . 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 Smoking status: Never Smoker  . Smokeless tobacco: Never Used  Substance and Sexual Activity  . Alcohol use: No    Alcohol/week: 0.0 standard drinks  . Drug use: No  . Sexual activity: Never  Other Topics Concern  . Not on file  Social History Narrative   Lives alone.   Her adult children live nearby.   Social Determinants of Health   Financial Resource Strain:   . Difficulty of Paying Living Expenses: Not on file  Food Insecurity:   . Worried About Charity fundraiser in the Last Year: Not on file  . Ran Out of Food in the Last Year: Not on file  Transportation Needs:   . Lack of Transportation (Medical): Not on file  . Lack of Transportation (Non-Medical): Not on file  Physical Activity:   . Days of Exercise per Week: Not on file  . Minutes of Exercise per Session: Not on file  Stress:   . Feeling of Stress : Not on file  Social Connections:   . Frequency of Communication with Friends and Family: Not on file  . Frequency of Social Gatherings with Friends and Family: Not on file  . Attends Religious Services: Not on file  . Active Member of Clubs or Organizations: Not on file  . Attends Archivist Meetings: Not on file  . Marital Status: Not on file  Intimate Partner Violence:   . Fear of Current or Ex-Partner: Not on file  . Emotionally Abused: Not on file  . Physically Abused: Not on file  . Sexually Abused: Not  on file    FAMILY HISTORY: Family History  Problem Relation Age of Onset  . Breast cancer Sister   . Hyperlipidemia Son   . Schizophrenia Mother   . Colon cancer Neg Hx   . Colon polyps Neg Hx   . Esophageal cancer Neg Hx   . Rectal cancer Neg Hx   . Stomach cancer Neg Hx     ALLERGIES:  is allergic to hctz [hydrochlorothiazide] and lipitor [atorvastatin].  MEDICATIONS:  Current Outpatient Medications  Medication Sig Dispense Refill  . amLODipine (NORVASC) 10 MG tablet Take 1 tablet (10 mg total) by mouth daily. 30 tablet 11  . Cholecalciferol (VITAMIN D-3) 5000 units TABS Take 1 tablet by mouth daily.    . famotidine (PEPCID) 20 MG tablet Take 1 tablet (  20 mg total) by mouth 2 (two) times daily. (Patient not taking: Reported on 07/18/2018) 60 tablet 3  . lisinopril (PRINIVIL,ZESTRIL) 10 MG tablet Take 0.5 tablets (5 mg total) by mouth daily. 15 tablet 11  . methocarbamol (ROBAXIN) 750 MG tablet Take 1 tablet (750 mg total) by mouth 3 (three) times daily as needed for muscle spasms. Caution of sedation (Patient not taking: Reported on 07/18/2018) 30 tablet 1  . Multiple Vitamins-Minerals (PRESERVISION AREDS 2 PO) Take by mouth.    Marland Kitchen omeprazole (PRILOSEC) 40 MG capsule Take 1 capsule (40 mg total) by mouth 2 (two) times daily before a meal. 90 capsule 3  . rosuvastatin (CRESTOR) 10 MG tablet Take 1 tablet (10 mg total) by mouth daily. Please make overdue appt with Dr. Marlou Porch before anymore refills. 2nd attempt 15 tablet 0   Current Facility-Administered Medications  Medication Dose Route Frequency Provider Last Rate Last Admin  . 0.9 %  sodium chloride infusion  500 mL Intravenous Once Nandigam, Venia Minks, MD        REVIEW OF SYSTEMS:   A 10+ POINT REVIEW OF SYSTEMS WAS OBTAINED including neurology, dermatology, psychiatry, cardiac, respiratory, lymph, extremities, GI, GU, Musculoskeletal, constitutional, breasts, reproductive, HEENT.  All pertinent positives are noted in the HPI.   All others are negative.      PHYSICAL EXAMINATION: ECOG FS:1 - Symptomatic but completely ambulatory  Vitals:   07/01/19 1428  BP: (!) 162/88  Pulse: 86  Resp: 18  Temp: 98.2 F (36.8 C)  SpO2: 99%   Wt Readings from Last 3 Encounters:  07/01/19 152 lb (68.9 kg)  01/28/19 155 lb 3.2 oz (70.4 kg)  10/29/18 152 lb 4.8 oz (69.1 kg)   Body mass index is 28.72 kg/m.    GENERAL:alert, in no acute distress and comfortable SKIN: no acute rashes, no significant lesions EYES: conjunctiva are pink and non-injected, sclera anicteric OROPHARYNX: MMM, no exudates, no oropharyngeal erythema or ulceration NECK: supple, no JVD LYMPH:  Couple 1-2cm LNs in b/l cervical and supraclavicular. Unchanged 2-3cm LNs in b/l axilla. No palpable lymphadenopathy in the inguinal regions. LUNGS: clear to auscultation b/l with normal respiratory effort HEART: regular rate & rhythm ABDOMEN:  normoactive bowel sounds , non tender, not distended. Liver 2 to 3 cm below the costal margin. Extremity: no pedal edema PSYCH: alert & oriented x 3 with fluent speech NEURO: no focal motor/sensory deficits   LABORATORY DATA:  I have reviewed the data as listed  . CBC Latest Ref Rng & Units 07/01/2019 01/28/2019 10/29/2018  WBC 4.0 - 10.5 K/uL 25.2(H) 11.7(H) 11.9(H)  Hemoglobin 12.0 - 15.0 g/dL 11.0(L) 11.5(L) 10.4(L)  Hematocrit 36.0 - 46.0 % 35.9(L) 37.4 35.1(L)  Platelets 150 - 400 K/uL 242 256 245    . CMP Latest Ref Rng & Units 07/01/2019 01/28/2019 10/29/2018  Glucose 70 - 99 mg/dL 83 88 88  BUN 8 - 23 mg/dL 26(H) 23 23  Creatinine 0.44 - 1.00 mg/dL 1.11(H) 1.14(H) 1.27(H)  Sodium 135 - 145 mmol/L 143 143 143  Potassium 3.5 - 5.1 mmol/L 4.3 4.4 4.6  Chloride 98 - 111 mmol/L 107 109 108  CO2 22 - 32 mmol/L 23 25 28   Calcium 8.9 - 10.3 mg/dL 9.5 9.9 9.6  Total Protein 6.5 - 8.1 g/dL 7.3 7.3 6.9  Total Bilirubin 0.3 - 1.2 mg/dL 0.5 0.4 0.3  Alkaline Phos 38 - 126 U/L 65 73 66  AST 15 - 41 U/L 19 18  12(L)  ALT 0 -  44 U/L 12 12 7     09/22/18 Right Axillary LN Biopsy:    07/31/18 Molecular Pathology:     02/21/16 Biopsy:    RADIOGRAPHIC STUDIES: I have personally reviewed the radiological images as listed and agreed with the findings in the report. No results found.  ASSESSMENT & PLAN:  73 y.o. female with  1. Chronic Lymphocytic Leukemia  02/21/16 Right Axillary LN biopsy revealed SLL, the initial diagnosis. 07/31/18 FISH CLL Prognostic Panel revealed:52.0% cells with 11q deletion Labs upon initial presentation from 09/22/18, WBC at 16.8k, HGB at 10.9, PLT at 272k. ANC at 1.2k, Lymphs at 11.3k, Monocytes at 4.1k. 09/22/18 Right Axillary LN Biopsy revealed SLL, ruling out a transformation event  08/15/18 PET/CT revealed Bulky lymphadenopathy in the neck, chest, abdomen, and pelvis demonstrating low level hypermetabolism throughout. 9 mm right thyroid nodule is hypermetabolic. Thyroid ultrasound recommended to further evaluate. Mucosal thickening with air-fluid level in the left maxillary sinus. 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   PLAN: -Discussed pt labwork today, 07/01/19; all values are WNL except for WBC at 25.2, Hemoglobin at 11.0, hematocrit at 35, Neutro Abs at 0.8, Bun at 26, Creatinine at 1.11, GFR Est Non Af Am at 50, GFR Est AFR AM at 57 -07/01/19 Hemoglobin at 11.0 -07/01/19 Platelets at 242 -07/01/19 Lymphs Abs at 23.4 -07/01/19 Neutro Abs at 0.8 -07/01/19 LDH at 429 -Markers suggest that she may need to start considering treatment soon. Will repeat PET/CT scan mid January 2021 -Treatment options: immunotherapy and chemotherapy (BR) , antibody via IV Dyann Kief) with oVenetoclax, vs Ibrutinib   FOLLOW UP: PET/CT in 3 weeks RTC with [name] with labs in 4 weeks  The total time spent in the appt was 25 minutes and more than 50% was on counseling and direct patient cares.  All of the patient's questions were answered with apparent  satisfaction. The patient knows to call the clinic with any problems, questions or concerns.      Sullivan Lone MD MS AAHIVMS [facility] Hematology/Oncology Physician [facility]  (Office):       [phone] (Work cell):  [phone] (Fax):           [phone]  07/01/2019 6:19 AM  I, Scot Dock, am acting as a scribe for Dr. Sullivan Lone.   .I have reviewed the above documentation for accuracy and completeness, and I agree with the above. Brunetta Genera MD

## 2019-07-02 ENCOUNTER — Telehealth: Payer: Self-pay | Admitting: Hematology

## 2019-07-02 NOTE — Telephone Encounter (Signed)
No los per 12/23.

## 2019-07-08 ENCOUNTER — Telehealth: Payer: Self-pay | Admitting: Hematology

## 2019-07-08 ENCOUNTER — Other Ambulatory Visit: Payer: Self-pay | Admitting: Hematology

## 2019-07-08 DIAGNOSIS — C9110 Chronic lymphocytic leukemia of B-cell type not having achieved remission: Secondary | ICD-10-CM

## 2019-07-08 NOTE — Telephone Encounter (Signed)
Scheduled appt per 12/29 sch message - pt is aware of appt date and time  

## 2019-07-14 ENCOUNTER — Telehealth: Payer: Self-pay | Admitting: Family Medicine

## 2019-07-14 DIAGNOSIS — E782 Mixed hyperlipidemia: Secondary | ICD-10-CM

## 2019-07-14 DIAGNOSIS — I10 Essential (primary) hypertension: Secondary | ICD-10-CM

## 2019-07-14 NOTE — Telephone Encounter (Signed)
-----   Message from Ellamae Sia sent at 07/02/2019 10:33 AM EST ----- Regarding: Lab orders for Wednesday, 1.6.21 Patient is scheduled for CPX labs, please order future labs, Thanks , Karna Christmas

## 2019-07-15 ENCOUNTER — Other Ambulatory Visit: Payer: Self-pay

## 2019-07-15 ENCOUNTER — Other Ambulatory Visit (INDEPENDENT_AMBULATORY_CARE_PROVIDER_SITE_OTHER): Payer: Medicare Other

## 2019-07-15 DIAGNOSIS — E782 Mixed hyperlipidemia: Secondary | ICD-10-CM

## 2019-07-15 DIAGNOSIS — I10 Essential (primary) hypertension: Secondary | ICD-10-CM | POA: Diagnosis not present

## 2019-07-15 LAB — LIPID PANEL
Cholesterol: 219 mg/dL — ABNORMAL HIGH (ref 0–200)
HDL: 52.6 mg/dL (ref >39.00)
LDL Cholesterol: 147 mg/dL — ABNORMAL HIGH (ref 0–99)
NonHDL: 166.69
Total CHOL/HDL Ratio: 4
Triglycerides: 98 mg/dL (ref 0.0–149.0)
VLDL: 19.6 mg/dL (ref 0.0–40.0)

## 2019-07-15 LAB — COMPREHENSIVE METABOLIC PANEL
ALT: 12 U/L (ref 0–35)
AST: 19 U/L (ref 0–37)
Albumin: 4.3 g/dL (ref 3.5–5.2)
Alkaline Phosphatase: 57 U/L (ref 39–117)
BUN: 24 mg/dL — ABNORMAL HIGH (ref 6–23)
CO2: 29 mEq/L (ref 19–32)
Calcium: 10 mg/dL (ref 8.4–10.5)
Chloride: 107 mEq/L (ref 96–112)
Creatinine, Ser: 1.18 mg/dL (ref 0.40–1.20)
GFR: 54.36 mL/min — ABNORMAL LOW (ref >60.00)
Glucose, Bld: 86 mg/dL (ref 70–99)
Potassium: 4.3 mEq/L (ref 3.5–5.1)
Sodium: 142 mEq/L (ref 135–145)
Total Bilirubin: 0.4 mg/dL (ref 0.2–1.2)
Total Protein: 6.9 g/dL (ref 6.0–8.3)

## 2019-07-15 LAB — TSH: TSH: 2.3 u[IU]/mL (ref 0.35–4.50)

## 2019-07-22 ENCOUNTER — Other Ambulatory Visit: Payer: Self-pay

## 2019-07-22 ENCOUNTER — Encounter: Payer: Self-pay | Admitting: Family Medicine

## 2019-07-22 ENCOUNTER — Ambulatory Visit (INDEPENDENT_AMBULATORY_CARE_PROVIDER_SITE_OTHER): Payer: Medicare Other | Admitting: Family Medicine

## 2019-07-22 VITALS — BP 135/80 | HR 91 | Temp 97.9°F | Ht 61.0 in | Wt 152.1 lb

## 2019-07-22 DIAGNOSIS — I10 Essential (primary) hypertension: Secondary | ICD-10-CM

## 2019-07-22 DIAGNOSIS — Z0000 Encounter for general adult medical examination without abnormal findings: Secondary | ICD-10-CM | POA: Diagnosis not present

## 2019-07-22 DIAGNOSIS — C8300 Small cell B-cell lymphoma, unspecified site: Secondary | ICD-10-CM | POA: Diagnosis not present

## 2019-07-22 DIAGNOSIS — E782 Mixed hyperlipidemia: Secondary | ICD-10-CM

## 2019-07-22 MED ORDER — LISINOPRIL 10 MG PO TABS: 5.0000 mg | ORAL_TABLET | Freq: Every day | ORAL | 11 refills | Status: DC

## 2019-07-22 MED ORDER — AMLODIPINE BESYLATE 10 MG PO TABS: 10.0000 mg | ORAL_TABLET | Freq: Every day | ORAL | 11 refills | Status: DC

## 2019-07-22 MED ORDER — ROSUVASTATIN CALCIUM 10 MG PO TABS: 10.0000 mg | ORAL_TABLET | Freq: Every day | ORAL | 11 refills | Status: DC

## 2019-07-22 NOTE — Assessment & Plan Note (Signed)
Reviewed health habits including diet and exercise and skin cancer prevention Reviewed appropriate screening tests for age  Also reviewed health mt list, fam hx and immunization status , as well as social and family history   See HPI Labs reviewed  Pt plans to schedule her mammogram (aware of the axillary adenopathy baseline)  She may consider shingrix after her lymphoma tx  Declines dexa  No falls or fx  Advance directive is up to date No cognitive concerns Normal hearing screen  Stable vision screen (pt is due for an eye exam)

## 2019-07-22 NOTE — Assessment & Plan Note (Signed)
Pt is planning to start treatment later this month for bulky adenopathy  Overall doing well  Continues oncology f/u with [name]

## 2019-07-22 NOTE — Assessment & Plan Note (Signed)
Disc goals for lipids and reasons to control them Rev last labs with pt Rev low sat fat diet in detail Has been out of crestor - LDL of 147 (suspect better when she has not missed doses) No longer goes to the lipid clinic  crestor refilled  Has tolerated it well

## 2019-07-22 NOTE — Patient Instructions (Addendum)
Be sure to schedule your mammogram   If you are interested in the new shingles vaccine (Shingrix) - call your local pharmacy to check on coverage and availability  If affordable, get on a wait list at your pharmacy to get the vaccine.  Any time you are ready to schedule a bone density test call and let us know   Try your best to drink more fluid   Try to eat a healthy diet and keep walking   Good luck with your upcoming clinic

## 2019-07-22 NOTE — Progress Notes (Signed)
Subjective:    Patient ID: Erica Carlson, female    DOB: Aug 13, 1945, 74 y.o.   MRN: [id number]  This visit occurred during the SARS-CoV-2 public health emergency.  Safety protocols were in place, including screening questions prior to the visit, additional usage of staff PPE, and extensive cleaning of exam room while observing appropriate contact time as indicated for disinfecting solutions.    HPI Pt presents for amw and health mt exam with review of chronic medical problems   Wt Readings from Last 3 Encounters:  07/22/19 152 lb 1 oz (69 kg)  07/01/19 152 lb (68.9 kg)  01/28/19 155 lb 3.2 oz (70.4 kg)   28.73 kg/m   I have personally reviewed the Medicare Annual Wellness questionnaire and have noted 1. The patient's medical and social history 2. Their use of alcohol, tobacco or illicit drugs 3. Their current medications and supplements 4. The patient's functional ability including ADL's, fall risks, home safety risks and hearing or visual             impairment. 5. Diet and physical activities 6. Evidence for depression or mood disorders  The patients weight, height, BMI have been recorded in the chart and visual acuity is per eye clinic.  I have made referrals, counseling and provided education to the patient based review of the above and I have provided the pt with a written personalized care plan for preventive services. Reviewed and updated provider list, see scanned forms.  See scanned forms.  Routine anticipatory guidance given to patient.  See health maintenance. Colon cancer screening  Colonoscopy 10/19  Breast cancer screening   Mammogram 4/19 -has not had one due to her lymphoma and some of them extend into her breast  Self breast exam- no lumps in breasts but she has them in underarm area  Sister had breast cancer Flu vaccine 10/20 Tetanus vaccine  2/16 Tdap Pneumovax completed Zoster status - had shingles in the past /may consider the vaccine later  Dexa- has  not had one and not ready to get one yet Falls-none Fractures-none  Supplements-takes vit D for her bones  Exercise -walking outside   Advance directive- done and up to date  Cognitive function addressed- see scanned forms- and if abnormal then additional documentation follows.  No problems at all   PMH and SH reviewed  Meds, vitals, and allergies reviewed.   ROS: See HPI.  Otherwise negative.    Has good days and bad days   Weight : Wt Readings from Last 3 Encounters:  07/22/19 152 lb 1 oz (69 kg)  07/01/19 152 lb (68.9 kg)  01/28/19 155 lb 3.2 oz (70.4 kg)  stable  Oncology wants her to keep wt stable (even if appetite is not great)  28.73 kg/m   Hearing/vision:  Hearing Screening   125Hz  250Hz  500Hz  1000Hz  2000Hz  3000Hz  4000Hz  6000Hz  8000Hz   Right ear:   40 40 40  40    Left ear:   40 40 40  40      Visual Acuity Screening   Right eye Left eye Both eyes  Without correction:     With correction: 20/30 20/30 20/25   she has glasses  Is trying to get in for her regular eye exam    Lymphoma  Sees [name] Lab Results  Component Value Date   WBC 25.2 (H) 07/01/2019   HGB 11.0 (L) 07/01/2019   HCT 35.9 (L) 07/01/2019   MCV 89.3 07/01/2019   PLT 242  07/01/2019   Reviewed note from 12/20  Lumps in her axillae and neck do bother her  Swallowing is good now  Supposed to start treatment until 1/27    Hypertension  bp is stable today   (it is much better at home than in the office)  Usually 130s/70s  No cp or palpitations or headaches or edema  No side effects to medicines  BP Readings from Last 3 Encounters:  07/22/19 (!) 148/82  07/01/19 (!) 162/88  01/28/19 (!) 159/96     bp after sitting BP: 135/80  Improved  Pulse Readings from Last 3 Encounters:  07/22/19 91  07/01/19 86  01/28/19 83    Lab Results  Component Value Date   CREATININE 1.18 07/15/2019   BUN 24 (H) 07/15/2019   NA 142 07/15/2019   K 4.3 07/15/2019   CL 107 07/15/2019   CO2 29  07/15/2019  she needs to drink more fluids  Lab Results  Component Value Date   ALT 12 07/15/2019   AST 19 07/15/2019   ALKPHOS 57 07/15/2019   BILITOT 0.4 07/15/2019   Hyperlipidemia  Lab Results  Component Value Date   CHOL 219 (H) 07/15/2019   CHOL 266 (H) 07/10/2018   CHOL 242 (H) 09/06/2017   Lab Results  Component Value Date   HDL 52.60 07/15/2019   HDL 48.10 07/10/2018   HDL 60 09/06/2017   Lab Results  Component Value Date   LDLCALC 147 (H) 07/15/2019   LDLCALC 189 (H) 07/10/2018   LDLCALC 163 (H) 09/06/2017   Lab Results  Component Value Date   TRIG 98.0 07/15/2019   TRIG 142.0 07/10/2018   TRIG 95 09/06/2017   Lab Results  Component Value Date   CHOLHDL 4 07/15/2019   CHOLHDL 6 07/10/2018   CHOLHDL 4.0 09/06/2017   Lab Results  Component Value Date   LDLDIRECT 204.9 02/23/2014   LDLDIRECT 249.3 04/23/2012   LDLDIRECT 217.7 12/11/2010   crestor and diet - just ran out of it  Gets that from [name]  Used to go to the lipid clinic    Lab Results  Component Value Date   TSH 2.30 07/15/2019     Patient Active Problem List   Diagnosis Date Noted  . Need for hepatitis C screening test 06/26/2016  . 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 Encounter for Medicare annual wellness exam 09/30/2013  . Estrogen deficiency 09/17/2012  . Special screening for malignant neoplasms, colon 12/15/2010  . Other screening mammogram 12/15/2010  . Routine general medical examination at a health care facility 12/08/2010  . Low back pain 09/06/2010  . OSTEOARTHRITIS, KNEE, RIGHT 10/11/2008  . Internal hemorrhoids 09/02/2008  . Hypertension 04/28/2008  . PNEUMONIA, HX OF 03/03/2008  . Hyperlipidemia 02/26/2008   Past Medical History:  Diagnosis Date  . Allergy    spring  . Arthritis    osteo arthritis of right knee  . Cataract    forming  . Hyperlipidemia   . Hypertension   . 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 Pneumonia     Past Surgical History:  Procedure Laterality Date  . ABDOMINAL HYSTERECTOMY    . AXILLARY LYMPH NODE BIOPSY Right 02/07/2016  . back injections    . BREAST SURGERY    . COLONOSCOPY  2009  . EYE SURGERY     retinal tear repair  . KNEE SURGERY     torn ALC per pt   Social History  Tobacco Use  . Smoking status: Never Smoker  . Smokeless tobacco: Never Used  Substance Use Topics  . Alcohol use: No    Alcohol/week: 0.0 standard drinks  . Drug use: No   Family History  Problem Relation Age of Onset  . Breast cancer Sister   . Hyperlipidemia Son   . Schizophrenia Mother   . Colon cancer Neg Hx   . Colon polyps Neg Hx   . Esophageal cancer Neg Hx   . Rectal cancer Neg Hx   . Stomach cancer Neg Hx    Allergies  Allergen Reactions  . Hctz [Hydrochlorothiazide] Nausea And Vomiting    Nausea and vomiting   . Lipitor [Atorvastatin] Other (See Comments)    Ache in back and legs   Current Outpatient Medications on File Prior to Visit  Medication Sig Dispense Refill  . Cholecalciferol (VITAMIN D-3) 5000 units TABS Take 1 tablet by mouth daily.    . Multiple Vitamins-Minerals (PRESERVISION AREDS 2 PO) Take by mouth.    . polyethylene glycol (MIRALAX / GLYCOLAX) 17 g packet Take 17 g by mouth daily.     No current facility-administered medications on file prior to visit.     Review of Systems  Constitutional: Positive for fatigue. Negative for activity change, appetite change, fever and unexpected weight change.  HENT: Negative for congestion, ear pain, rhinorrhea, sinus pressure and sore throat.   Eyes: Negative for pain, redness and visual disturbance.  Respiratory: Negative for cough, shortness of breath and wheezing.   Cardiovascular: Negative for chest pain and palpitations.  Gastrointestinal: Negative for abdominal pain, blood in stool, constipation and diarrhea.  Endocrine: Negative for polydipsia and polyuria.  Genitourinary: Negative for dysuria, frequency and  urgency.  Musculoskeletal: Negative for arthralgias, back pain and myalgias.  Skin: Negative for pallor and rash.  Allergic/Immunologic: Negative for environmental allergies.  Neurological: Negative for dizziness, syncope and headaches.  Hematological: Positive for adenopathy. Does not bruise/bleed easily.       Very bulky adenopathy in neck and axillae from lymphoma   Psychiatric/Behavioral: Negative for decreased concentration and dysphoric mood. The patient is not nervous/anxious.        Objective:   Physical Exam Constitutional:      General: She is not in acute distress.    Appearance: Normal appearance. She is well-developed and normal weight. She is not ill-appearing or diaphoretic.  HENT:     Head: Normocephalic and atraumatic.     Right Ear: Tympanic membrane, ear canal and external ear normal.     Left Ear: Tympanic membrane, ear canal and external ear normal.     Nose: Nose normal. No congestion.     Mouth/Throat:     Mouth: Mucous membranes are moist.     Pharynx: Oropharynx is clear. No posterior oropharyngeal erythema.  Eyes:     General: No scleral icterus.    Extraocular Movements: Extraocular movements intact.     Conjunctiva/sclera: Conjunctivae normal.     Pupils: Pupils are equal, round, and reactive to light.  Neck:     Thyroid: No thyromegaly.     Vascular: No carotid bruit or JVD.     Comments: Bulky adenopathy noted  Cardiovascular:     Rate and Rhythm: Normal rate and regular rhythm.     Pulses: Normal pulses.     Heart sounds: Normal heart sounds. No gallop.   Pulmonary:     Effort: Pulmonary effort is normal. No respiratory distress.     Breath sounds:  Normal breath sounds. No wheezing.     Comments: Good air exch Chest:     Chest wall: No tenderness.  Abdominal:     General: Bowel sounds are normal. There is no distension or abdominal bruit.     Palpations: Abdomen is soft. There is no mass.     Tenderness: There is no abdominal tenderness.      Hernia: No hernia is present.  Genitourinary:    Comments: Breast exam: No mass, nodules, thickening, tenderness, bulging, retraction, inflamation, nipple discharge or skin changes noted.  Large/bulky axillary adenopathy noted      Musculoskeletal:        General: No tenderness. Normal range of motion.     Cervical back: Normal range of motion and neck supple. No rigidity. No muscular tenderness.     Right lower leg: No edema.     Left lower leg: No edema.  Lymphadenopathy:     Cervical: No cervical adenopathy.  Skin:    General: Skin is warm and dry.     Coloration: Skin is not pale.     Findings: No erythema or rash.     Comments: Lentigines and some sks  Neurological:     Mental Status: She is alert. Mental status is at baseline.     Cranial Nerves: No cranial nerve deficit.     Motor: No abnormal muscle tone.     Coordination: Coordination normal.     Gait: Gait normal.     Deep Tendon Reflexes: Reflexes are normal and symmetric. Reflexes normal.  Psychiatric:        Mood and Affect: Mood normal.        Cognition and Memory: Cognition and memory normal.           Assessment & Plan:   Problem List Items Addressed This Visit      Cardiovascular and Mediastinum   Hypertension    bp in fair control at this time  BP Readings from Last 1 Encounters:  07/22/19 135/80   No changes needed Most recent labs reviewed  Disc lifstyle change with low sodium diet and exercise        Relevant Medications   rosuvastatin (CRESTOR) 10 MG tablet   amLODipine (NORVASC) 10 MG tablet   lisinopril (ZESTRIL) 10 MG tablet     Other   Hyperlipidemia    Disc goals for lipids and reasons to control them Rev last labs with pt Rev low sat fat diet in detail Has been out of crestor - LDL of 147 (suspect better when she has not missed doses) No longer goes to the lipid clinic  crestor refilled  Has tolerated it well      Relevant Medications   rosuvastatin (CRESTOR) 10 MG tablet     amLODipine (NORVASC) 10 MG tablet   lisinopril (ZESTRIL) 10 MG tablet   Routine general medical examination at a health care facility    Reviewed health habits including diet and exercise and skin cancer prevention Reviewed appropriate screening tests for age  Also reviewed health mt list, fam hx and immunization status , as well as social and family history   See HPI Labs reviewed  Pt plans to schedule her mammogram (aware of the axillary adenopathy baseline)  She may consider shingrix after her lymphoma tx  Declines dexa  No falls or fx  Advance directive is up to date No cognitive concerns Normal hearing screen  Stable vision screen (pt is due for an eye exam)  Encounter for Medicare annual wellness exam - Primary    Reviewed health habits including diet and exercise and skin cancer prevention Reviewed appropriate screening tests for age  Also reviewed health mt list, fam hx and immunization status , as well as social and family history   See HPI Labs reviewed  Pt plans to schedule her mammogram (aware of the axillary adenopathy baseline)  She may consider shingrix after her lymphoma tx  Declines dexa  No falls or fx  Advance directive is up to date No cognitive concerns Normal hearing screen  Stable vision screen (pt is due for an eye exam)       Lymphoma, small lymphocytic ([place])    Pt is planning to start treatment later this month for bulky adenopathy  Overall doing well  Continues oncology f/u with [name]

## 2019-07-22 NOTE — Assessment & Plan Note (Signed)
bp in fair control at this time  BP Readings from Last 1 Encounters:  07/22/19 135/80   No changes needed Most recent labs reviewed  Disc lifstyle change with low sodium diet and exercise

## 2019-08-05 ENCOUNTER — Telehealth: Payer: Self-pay | Admitting: *Deleted

## 2019-08-05 ENCOUNTER — Inpatient Hospital Stay: Payer: Medicare Other | Attending: Hematology

## 2019-08-05 ENCOUNTER — Other Ambulatory Visit: Payer: Self-pay

## 2019-08-05 ENCOUNTER — Inpatient Hospital Stay: Payer: Medicare Other | Admitting: Hematology

## 2019-08-05 DIAGNOSIS — C8300 Small cell B-cell lymphoma, unspecified site: Secondary | ICD-10-CM | POA: Diagnosis not present

## 2019-08-05 DIAGNOSIS — C9110 Chronic lymphocytic leukemia of B-cell type not having achieved remission: Secondary | ICD-10-CM

## 2019-08-05 LAB — CBC WITH DIFFERENTIAL/PLATELET
Abs Immature Granulocytes: 0 10*3/uL (ref 0.00–0.07)
Basophils Absolute: 0 10*3/uL (ref 0.0–0.1)
Basophils Relative: 0 %
Eosinophils Absolute: 0 10*3/uL (ref 0.0–0.5)
Eosinophils Relative: 0 %
HCT: 32.4 % — ABNORMAL LOW (ref 36.0–46.0)
Hemoglobin: 10.2 g/dL — ABNORMAL LOW (ref 12.0–15.0)
Lymphocytes Relative: 98 %
Lymphs Abs: 27.3 10*3/uL — ABNORMAL HIGH (ref 0.7–4.0)
MCH: 27.6 pg (ref 26.0–34.0)
MCHC: 31.5 g/dL (ref 30.0–36.0)
MCV: 87.8 fL (ref 80.0–100.0)
Monocytes Absolute: 0 10*3/uL — ABNORMAL LOW (ref 0.1–1.0)
Monocytes Relative: 0 %
Neutro Abs: 0.6 10*3/uL — ABNORMAL LOW (ref 1.7–7.7)
Neutrophils Relative %: 2 %
Platelets: 307 10*3/uL (ref 150–400)
RBC: 3.69 MIL/uL — ABNORMAL LOW (ref 3.87–5.11)
RDW: 14.7 % (ref 11.5–15.5)
WBC: 27.9 10*3/uL — ABNORMAL HIGH (ref 4.0–10.5)
nRBC: 0 % (ref 0.0–0.2)

## 2019-08-05 LAB — CMP (CANCER CENTER ONLY)
ALT: 7 U/L (ref 0–44)
AST: 14 U/L — ABNORMAL LOW (ref 15–41)
Albumin: 4.1 g/dL (ref 3.5–5.0)
Alkaline Phosphatase: 61 U/L (ref 38–126)
Anion gap: 10 (ref 5–15)
BUN: 22 mg/dL (ref 8–23)
CO2: 25 mmol/L (ref 22–32)
Calcium: 9.4 mg/dL (ref 8.9–10.3)
Chloride: 109 mmol/L (ref 98–111)
Creatinine: 1.14 mg/dL — ABNORMAL HIGH (ref 0.44–1.00)
GFR, Est AFR Am: 56 mL/min — ABNORMAL LOW (ref >60)
GFR, Estimated: 48 mL/min — ABNORMAL LOW (ref >60)
Glucose, Bld: 89 mg/dL (ref 70–99)
Potassium: 4.2 mmol/L (ref 3.5–5.1)
Sodium: 144 mmol/L (ref 135–145)
Total Bilirubin: 0.3 mg/dL (ref 0.3–1.2)
Total Protein: 7 g/dL (ref 6.5–8.1)

## 2019-08-05 LAB — LACTATE DEHYDROGENASE: LDH: 375 U/L — ABNORMAL HIGH (ref 98–192)

## 2019-08-05 NOTE — Telephone Encounter (Signed)
Per Dr. Irene Limbo, cancelled appt with patient for today. Will reschedule for phone visit with MD once PET scan is completed. Patient at [facility] lab - informed patient of Dr. Grier Mitts plan and gave number for Radiology Scheduling. Patient verbalized understanding.

## 2019-08-12 ENCOUNTER — Other Ambulatory Visit: Payer: Self-pay

## 2019-08-12 ENCOUNTER — Encounter (HOSPITAL_COMMUNITY)
Admission: RE | Admit: 2019-08-12 | Discharge: 2019-08-12 | Disposition: A | Discharge status: Home / Self Care | Payer: Medicare Other | Source: Ambulatory Visit | Attending: Hematology | Admitting: Hematology

## 2019-08-12 ENCOUNTER — Encounter (HOSPITAL_COMMUNITY): Admission: RE | Admit: 2019-08-12 | Payer: Medicare Other | Source: Ambulatory Visit

## 2019-08-12 DIAGNOSIS — Z79899 Other long term (current) drug therapy: Secondary | ICD-10-CM | POA: Diagnosis not present

## 2019-08-12 DIAGNOSIS — E079 Disorder of thyroid, unspecified: Secondary | ICD-10-CM | POA: Diagnosis not present

## 2019-08-12 DIAGNOSIS — C9110 Chronic lymphocytic leukemia of B-cell type not having achieved remission: Secondary | ICD-10-CM | POA: Diagnosis not present

## 2019-08-12 LAB — GLUCOSE, CAPILLARY: Glucose-Capillary: 83 mg/dL (ref 70–99)

## 2019-08-12 MED ORDER — FLUDEOXYGLUCOSE F - 18 (FDG) INJECTION
7.3000 | Freq: Once | INTRAVENOUS | Status: AC | PRN
  Administered 2019-08-12: 7.3 via INTRAVENOUS

## 2019-08-13 ENCOUNTER — Telehealth: Payer: Self-pay | Admitting: *Deleted

## 2019-08-13 NOTE — Telephone Encounter (Signed)
Patient called. She has completed her PET scan and now needs appt with Dr. Irene Limbo. She had lab work completed on 1/27, so does not need labs. Schedule message sent - contacted patient and advised her to expect call from scheduler - she verbalized understanding.

## 2019-08-17 ENCOUNTER — Telehealth: Payer: Self-pay | Admitting: Hematology

## 2019-08-17 NOTE — Telephone Encounter (Signed)
Scheduled appt per 2/4 sch message - pt aware of appt date and time

## 2019-08-25 ENCOUNTER — Other Ambulatory Visit: Payer: Self-pay

## 2019-08-25 ENCOUNTER — Inpatient Hospital Stay: Payer: Medicare Other | Attending: Hematology | Admitting: Hematology

## 2019-08-25 VITALS — BP 152/80 | HR 92 | Temp 97.9°F | Resp 18 | Ht 61.0 in | Wt 151.5 lb

## 2019-08-25 DIAGNOSIS — L98419 Non-pressure chronic ulcer of buttock with unspecified severity: Secondary | ICD-10-CM | POA: Diagnosis not present

## 2019-08-25 DIAGNOSIS — Z7189 Other specified counseling: Secondary | ICD-10-CM | POA: Diagnosis not present

## 2019-08-25 DIAGNOSIS — D649 Anemia, unspecified: Secondary | ICD-10-CM | POA: Diagnosis not present

## 2019-08-25 DIAGNOSIS — C9110 Chronic lymphocytic leukemia of B-cell type not having achieved remission: Secondary | ICD-10-CM

## 2019-08-25 DIAGNOSIS — M7989 Other specified soft tissue disorders: Secondary | ICD-10-CM | POA: Diagnosis not present

## 2019-08-25 MED ORDER — MUPIROCIN 2 % EX OINT: 1.0000 "application " | TOPICAL_OINTMENT | Freq: Two times a day (BID) | CUTANEOUS | 1 refills | Status: DC

## 2019-08-25 NOTE — Progress Notes (Signed)
HEMATOLOGY/ONCOLOGY CLINIC NOTE  Date of Service: 08/25/2019  Patient Care Team: Tower, Wynelle Fanny, MD as PCP - General (Family Medicine) Galvin Proffer, Lydia as Consulting Physician (Optometry) Brunetta Genera, MD as Consulting Physician (Hematology)  CHIEF COMPLAINTS/PURPOSE OF CONSULTATION:  Chronic Lymphocytic Leukemia  HISTORY OF PRESENTING ILLNESS:   Erica Carlson is a wonderful 74 y.o. female who has been referred to [place] by my colleague Dr. 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 The pt reports that she is doing well overall.   The pt reports that her SLL was initially picked up tthrough a mammogram in 2017. She notes that her CLL/SLL was stable until December 2019. She notes that she began having feelings of indigestion when she eats. She denies issues with acid reflux. The pt notes that her indigestion began making it difficult to eat, but also endorses a weakened appetite. She lost about 25 pounds, but has gained back 5 pounds. The pt had an unrevealing endoscopy on 07/18/18. She notes that she has some difficulty eating meats. She is staying well hydrated. She has experimented with eating different foods. She endorses having a "lot of gas," and puts a teaspoon of her Mylanta in her coffee every morning.  The pt denies any fevers, chills, or night sweats. She has noticed some lumps in her bilateral neck and armpits. She denies other abdominal pains as such. She denies problems passing urine, nor increased urinary frequency. She denies CP or SOB. She denies leg swelling.   Denies liver, kidney, heart, or lung problems and endorses being generally pretty healthy.  Of note prior to the patient's visit today, pt has had a PET/CT completed on 08/15/18 with results revealing Bulky lymphadenopathy in the neck, chest, abdomen, and pelvis demonstrating low level hypermetabolism throughout. 9 mm right thyroid nodule is hypermetabolic.  Thyroid ultrasound recommended to further evaluate. Mucosal thickening with air-fluid level in the left maxillary sinus. 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  Most recent lab results (09/22/18) of CBC w/diff and BMP is as follows: all values are WNL except for WBC at 16.8k, HGB at 10.9, MCHC at 29.7, ANC at 1.2k, Lymphs abs at 11.3k, Monocytes abs at 4.1k, Creatinine at 1.06.  On review of systems, pt reports frequent indigestion, weakened appetite, weight loss, weaker energy levels, bilateral neck bumps, bilateral armpit bumps, and denies CP, SOB, abdominal pain, leg swelling, fevers, chills, night sweats, lower abdominal pains, changes in bowel habits, and any other symptoms.  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 The patient's last visit with [place] was on 07/01/2019. The pt reports that she is doing well overall.  The pt reports that she can tell that there has been growth in her cervical lymph nodes, as she is having difficulty buttoning her shirts over her neck. She denies abdominal pain but notes that she has a lot of gas. Pt also has occasional leg swelling depending on her activities, which goes away when she elevates her legs. Pt is currently on the waiting list to receive the COVID19 vaccine.   She has a spot on her buttocks that is causing her discomfort. It does not usually hurt but it does itch. This spot comes and goes yearly. The first time that this spot appeared she was diagnosed with and treated for Shingles  and it went away. Pt has been using an antibiotic ointment and alcohol to treat in recent years. It typically presents as a single blister.   Of note since the patient's last visit, pt has had PET/CT scan ([id number]) completed on 08/12/2019 with results revealing "1. Marked worsening of bulky adenopathy in the  neck, chest, abdomen and pelvis and interval development of pulmonary lesions all suspicious for worsening of lymphoproliferative disorder. 2. Size increase and increase in metabolic activity is noted. 3. Right thyroid lesion remains FDG avid though less so than on the prior exam. Suggest thyroid ultrasound if not yet performed for further evaluation. 4. Mild increase in size of both liver and spleen compared to prior study. 5. Marked hypermetabolic activity associated with the skin about the right gluteal region, correlate with physical examination to exclude small skin lesion."  Lab results (08/05/19) of CBC w/diff and CMP is as follows: all values are WNL except for WBC at 27.9K, RBC at 3.69, Hgb at 10.2, HCT at 32.4, Neutro Abs at 0.6K, Lymphs Abs at 27.3K, Mono Abs at 0.0K, WBC Morphology show "Smudge Cells", Creatinine at 1.14, AST at 14, GFR Est Af Am at 56. 08/05/2019 LDH at 375  On review of systems, pt reports low appetite, gas, occasional leg swelling and denies fevers, chills, night sweats, unexpected weight loss, SOB, chest pain, abdominal pain and any other symptoms.   MEDICAL HISTORY:  Past Medical History:  Diagnosis Date  . Allergy    spring  . Arthritis    osteo arthritis of right knee  . Cataract    forming  . Hyperlipidemia   . Hypertension   . 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 Pneumonia     SURGICAL HISTORY: Past Surgical History:  Procedure Laterality Date  . ABDOMINAL HYSTERECTOMY    . AXILLARY LYMPH NODE BIOPSY Right 02/07/2016  . back injections    . BREAST SURGERY    . COLONOSCOPY  2009  . EYE SURGERY     retinal tear repair  . KNEE SURGERY     torn ALC per pt    SOCIAL HISTORY: Social History   Socioeconomic History  . Marital status: Widowed    Spouse name: n/a  . Number of children: 6  . Years of education: 2  . Highest education level: Not on file  Occupational History  . Occupation: self-employed    Fish farm manager: Herbalist    Comment: alterations and design  Tobacco Use  . Smoking status: Never Smoker  . Smokeless tobacco: Never Used  Substance and Sexual Activity  . Alcohol use: No    Alcohol/week: 0.0 standard drinks  . Drug use: No  . Sexual activity: Never  Other Topics Concern  . Not on file  Social History Narrative   Lives alone.   Her adult children live nearby.   Social Determinants of Health   Financial Resource Strain:   . Difficulty of Paying Living Expenses: Not on file  Food Insecurity:   . Worried About Charity fundraiser in the Last Year: Not on file  . Ran Out of Food in the Last Year: Not on file  Transportation Needs:   . Lack of Transportation (Medical): Not on file  . Lack of Transportation (Non-Medical): Not on file  Physical Activity:   . Days of Exercise per Week: Not on file  . Minutes of Exercise per Session: Not on file  Stress:   . Feeling of  Stress : Not on file  Social Connections:   . Frequency of Communication with Friends and Family: Not on file  . Frequency of Social Gatherings with Friends and Family: Not on file  . Attends Religious Services: Not on file  . Active Member of Clubs or Organizations: Not on file  . Attends Archivist Meetings: Not on file  . Marital Status: Not on file  Intimate Partner Violence:   . Fear of Current or Ex-Partner: Not on file  . Emotionally Abused: Not on file  . Physically Abused: Not on file  . Sexually Abused: Not on file    FAMILY HISTORY: Family History  Problem Relation Age of Onset  . Breast cancer Sister   . Hyperlipidemia Son   . Schizophrenia Mother   . Colon cancer Neg Hx   . Colon polyps Neg Hx   . Esophageal cancer Neg Hx   . Rectal cancer Neg Hx   . Stomach cancer Neg Hx     ALLERGIES:  is allergic to hctz [hydrochlorothiazide] and lipitor [atorvastatin].  MEDICATIONS:  Current Outpatient Medications  Medication Sig Dispense Refill  . amLODipine (NORVASC) 10 MG tablet Take 1  tablet (10 mg total) by mouth daily. 30 tablet 11  . Cholecalciferol (VITAMIN D-3) 5000 units TABS Take 1 tablet by mouth daily.    Marland Kitchen lisinopril (ZESTRIL) 10 MG tablet Take 0.5 tablets (5 mg total) by mouth daily. 15 tablet 11  . Multiple Vitamins-Minerals (PRESERVISION AREDS 2 PO) Take by mouth.    . polyethylene glycol (MIRALAX / GLYCOLAX) 17 g packet Take 17 g by mouth daily.    . rosuvastatin (CRESTOR) 10 MG tablet Take 1 tablet (10 mg total) by mouth daily. Please make overdue appt with Dr. Marlou Porch before anymore refills. 2nd attempt 30 tablet 11  . mupirocin ointment (BACTROBAN) 2 % Apply 1 application topically 2 (two) times daily. To sore on buttocks 22 g 1   No current facility-administered medications for this visit.    REVIEW OF SYSTEMS:   A 10+ POINT REVIEW OF SYSTEMS WAS OBTAINED including neurology, dermatology, psychiatry, cardiac, respiratory, lymph, extremities, GI, GU, Musculoskeletal, constitutional, breasts, reproductive, HEENT.  All pertinent positives are noted in the HPI.  All others are negative.    PHYSICAL EXAMINATION: ECOG FS:1 - Symptomatic but completely ambulatory  Vitals:   08/25/19 0932  BP: (!) 152/80  Pulse: 92  Resp: 18  Temp: 97.9 F (36.6 C)  SpO2: 99%   Wt Readings from Last 3 Encounters:  08/25/19 151 lb 8 oz (68.7 kg)  07/22/19 152 lb 1 oz (69 kg)  07/01/19 152 lb (68.9 kg)   Body mass index is 28.63 kg/m.    GENERAL:alert, in no acute distress and comfortable, superficial right buttock ulcer with yellowish slough, about 2 cm in size  SKIN: no acute rashes, no significant lesions EYES: conjunctiva are pink and non-injected, sclera anicteric OROPHARYNX: MMM, no exudates, no oropharyngeal erythema or ulceration NECK: supple, no JVD LYMPH: prominent b/l cervical lymphadenopathy, prominent b/l axillary lymphadenopathy LUNGS: clear to auscultation b/l with normal respiratory effort HEART: regular rate & rhythm ABDOMEN:  normoactive bowel  sounds , non tender, not distended. Minimally enlarged liver and spleen Extremity: no pedal edema PSYCH: alert & oriented x 3 with fluent speech NEURO: no focal motor/sensory deficits   LABORATORY DATA:  I have reviewed the data as listed  . CBC Latest Ref Rng & Units 08/05/2019 07/01/2019 01/28/2019  WBC 4.0 - 10.5 K/uL 27.9(H)  25.2(H) 11.7(H)  Hemoglobin 12.0 - 15.0 g/dL 10.2(L) 11.0(L) 11.5(L)  Hematocrit 36.0 - 46.0 % 32.4(L) 35.9(L) 37.4  Platelets 150 - 400 K/uL 307 242 256    . CMP Latest Ref Rng & Units 08/05/2019 07/15/2019 07/01/2019  Glucose 70 - 99 mg/dL 89 86 83  BUN 8 - 23 mg/dL 22 24(H) 26(H)  Creatinine 0.44 - 1.00 mg/dL 1.14(H) 1.18 1.11(H)  Sodium 135 - 145 mmol/L 144 142 143  Potassium 3.5 - 5.1 mmol/L 4.2 4.3 4.3  Chloride 98 - 111 mmol/L 109 107 107  CO2 22 - 32 mmol/L 25 29 23   Calcium 8.9 - 10.3 mg/dL 9.4 10.0 9.5  Total Protein 6.5 - 8.1 g/dL 7.0 6.9 7.3  Total Bilirubin 0.3 - 1.2 mg/dL 0.3 0.4 0.5  Alkaline Phos 38 - 126 U/L 61 57 65  AST 15 - 41 U/L 14(L) 19 19  ALT 0 - 44 U/L 7 12 12     09/22/18 Right Axillary LN Biopsy:    07/31/18 Molecular Pathology:     02/21/16 Biopsy:    RADIOGRAPHIC STUDIES: I have personally reviewed the radiological images as listed and agreed with the findings in the report. NM PET Image Restag (PS) Skull Base To Thigh  Result Date: 08/12/2019 CLINICAL DATA:  Subsequent treatment strategy for CLL. EXAM: NUCLEAR MEDICINE PET SKULL BASE TO THIGH TECHNIQUE: 7.3 mCi F-18 FDG was injected intravenously. Full-ring PET imaging was performed from the skull base to thigh after the radiotracer. CT data was obtained and used for attenuation correction and anatomic localization. Fasting blood glucose: 83 mg/dl COMPARISON:  08/15/2018 FINDINGS: Mediastinal blood pool activity: SUV max 2.6 Liver activity: SUV max 3.6 NECK: Bulky adenopathy in the bilateral neck throughout all visualized levels. Individual lymph nodes are not  discernible. Nodal disease on the left measuring approximately 3.6 x 5.5 cm in greatest axial dimension (image 22, series 4) (SUVmax = 4.1) previous SUV max in the left-sided cervical nodes was 2.9. Parapharyngeal nodal tissue also with marked enlargement. This was involved previously just not enlarged to the same extent as on today's study. All lymph nodes seen in the neck of similar levels of FDG uptake. Intensely hypermetabolic right thyroid lesion (SUVmax = 6.1) grossly similar to previous exam Incidental CT findings: None CHEST: Bulky axillary adenopathy and mediastinal and hilar adenopathy as well as retrocrural and para-aortic adenopathy all increased to a similar extent as in the neck. Right axillary and left axillary lymph nodes with marked enlargement. For example, image 47 of series 4 with a 2.7 cm lymph node, short axis dimension previously 1.6 cm. All visualized lymph nodes with similar enlargement. (SUVmax = 3.9) previously 2.9 Left axillary adenopathy similarly enlarged (SUVmax = 3.5) 2.3 Pre-vascular lymph node (image 51, series 4) 2.2 cm short axis previously approximately 1.4 cm. Left upper lobe nodule measuring approximately 8 mm with intense FDG uptake (SUVmax = 5.0 not present on the previous exam numerous other areas of nodularity, essentially innumerable all less than a cm scattered throughout both left and right chest showing faint, low level activity less well-defined than this dominant area in the right upper lobe. Incidental CT findings: Uplifting of the thoracic aorta. Signs of vascular disease as before. ABDOMEN/PELVIS: Retrocrural adenopathy extending into the chest with similar enlargement to previous nodal disease as described. Right retrocrural nodal conglomerate measuring 4.1 cm in short axis on today's study previously approximately 1.9 cm. (SUVmax = 3.4 previously undo that) previously 2.8 Bulky left periaortic adenopathy measuring approximately 5.7 x  5.4 cm (image 106, series 4)  easier to define discrete nodes in this area, short axis measurement of the largest area on the prior study approximately 1.8 cm to 2 cm (SUVmax = 3.7) previously 2.6 Bulky right pelvic sidewall adenopathy 11 x 8 cm previously 7 x 4.3 cm (SUVmax = 4.3) not previously assessed in terms of S UV max. Bulky adenopathy in the left pelvic sidewall (SUVmax = 5.0) previously 2.9 Uptake in the soft tissues overlying the right gluteal region (SUVmax = 11.0) some skin thickening and or stranding in this location with similar appearance to prior imaging evaluation, subtle area of increased FDG uptake scattered throughout this region on the previous exam. Incidental CT findings: Abdominal aortic atherosclerosis and up lifting of the abdominal aorta now more pronounced than on the prior study. The spleen is mildly enlarged compared to the previous exam with metabolic activity that is similar to that of adjacent liver. Spleen measuring 10 cm in axial dimension as compared to 9 cm. Less than 10 cm greatest craniocaudal span. Liver span also mildly enlarged approximately 18 cm as compared to 16 cm greatest craniocaudal dimension. Bowel loops and abdominal structures are displaced by massive retroperitoneal adenopathy. Numerous mesenteric lymph nodes are also demonstrated with similar degree of enlargement. The appendix is normal and there is no acute bowel process. Urinary bladder is displaced due to the marked pelvic adenopathy into the right pelvis. SKELETON: No signs of focal hypermetabolic process to suggest skeletal involvement. Incidental CT findings: Spinal degenerative change without acute or destructive bone process. IMPRESSION: 1. Marked worsening of bulky adenopathy in the neck, chest, abdomen and pelvis and interval development of pulmonary lesions all suspicious for worsening of lymphoproliferative disorder. 2. Size increase and increase in metabolic activity is noted. 3. Right thyroid lesion remains FDG avid though  less so than on the prior exam. Suggest thyroid ultrasound if not yet performed for further evaluation. 4. Mild increase in size of both liver and spleen compared to prior study. 5. Marked hypermetabolic activity associated with the skin about the right gluteal region, correlate with physical examination to exclude small skin lesion. Electronically Signed   By: Zetta Bills M.D.   On: 08/12/2019 17:47    ASSESSMENT & PLAN:  74 y.o. female with  1. Chronic Lymphocytic Leukemia  02/21/16 Right Axillary LN biopsy revealed SLL, the initial diagnosis. 07/31/18 FISH CLL Prognostic Panel revealed:52.0% cells with 11q deletion Labs upon initial presentation from 09/22/18, WBC at 16.8k, HGB at 10.9, PLT at 272k. ANC at 1.2k, Lymphs at 11.3k, Monocytes at 4.1k. 09/22/18 Right Axillary LN Biopsy revealed SLL, ruling out a transformation event  08/15/18 PET/CT revealed Bulky lymphadenopathy in the neck, chest, abdomen, and pelvis demonstrating low level hypermetabolism throughout. 9 mm right thyroid nodule is hypermetabolic. Thyroid ultrasound recommended to further evaluate. Mucosal thickening with air-fluid level in the left maxillary sinus. 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   PLAN: -Discussed pt labwork, 08/05/19; WBC is elevating, mild anemia, blood chemistries look good -Discussed 08/05/2019 LDH shows a marked elevation over the last 6 months -Discussed 08/12/2019 PET/CT scan ([id number]) which revealed disease progression in neck, chest, abdomen, pelvis and interval development of pulmonary lesions -Pt is showing lab and clinical evidence of disease progression. Indication to begin treatment at this time.  -Would prefer to use targeted therapy vs chemotherapy at this time -Discussed using Gazyva and beginning Venetoclax with C2 -Advised pt that we would continue Venetoclax for at least a year -Recommend pt use  a topical antibiotic for buttock ulcer -Recommend pt keep buttock  ulcer covered for at least 48 hrs  -Recommend pt receive COVID19 vaccine when available   -Will set pt up for chemotherapy education in 3-4 days  -Will begin Gazyva in 7-10 days -Rx Mupirocin    FOLLOW UP: Chemo-education for Gazyva/Venetoclax in 3-4 days Plan to start Gazyva in 7-10 days  The total time spent in the appt was 30 mins and more than 50% was on counseling and direct patient cares.  All of the patient's questions were answered with apparent satisfaction. The patient knows to call the clinic with any problems, questions or concerns.    Sullivan Lone MD [place] AAHIVMS [facility] Hematology/Oncology Physician [facility]  (Office):       [phone] (Work cell):  [phone] (Fax):           [phone]  08/25/2019 10:18 AM  I, Yevette Edwards, am acting as a scribe for Dr. Sullivan Lone.   .I have reviewed the above documentation for accuracy and completeness, and I agree with the above. Brunetta Genera MD

## 2019-08-27 ENCOUNTER — Telehealth: Payer: Self-pay | Admitting: Hematology

## 2019-08-27 NOTE — Telephone Encounter (Signed)
Scheduled per 02/16 los, patient has been called and notified. °

## 2019-08-28 ENCOUNTER — Telehealth: Payer: Self-pay | Admitting: *Deleted

## 2019-08-28 ENCOUNTER — Inpatient Hospital Stay: Payer: Medicare Other

## 2019-08-28 NOTE — Telephone Encounter (Signed)
Patient to have covid vax on  Sunday 2/21

## 2019-08-30 ENCOUNTER — Ambulatory Visit: Payer: Medicare Other | Attending: Internal Medicine

## 2019-08-30 ENCOUNTER — Other Ambulatory Visit: Payer: Self-pay

## 2019-08-30 DIAGNOSIS — Z23 Encounter for immunization: Secondary | ICD-10-CM | POA: Insufficient documentation

## 2019-08-30 NOTE — Progress Notes (Signed)
   Covid-19 Vaccination Clinic  Name:  KALPANA FURMANSKI    MRN: [id number] DOB: 11-10-1945  08/30/2019  Ms. Uemura was observed post Covid-19 immunization for 15 minutes without incidence. She was provided with Vaccine Information Sheet and instruction to access the V-Safe system.   Ms. Stave was instructed to call 911 with any severe reactions post vaccine: Marland Kitchen Difficulty breathing  . Swelling of your face and throat  . A fast heartbeat  . A bad rash all over your body  . Dizziness and weakness    Immunizations Administered    Name Date Dose VIS Date Route   Pfizer COVID-19 Vaccine 08/30/2019  2:04 PM 0.3 mL 06/19/2019 Intramuscular   Manufacturer: [place]   Lot: [id number]   [place]: [id number]

## 2019-08-31 DIAGNOSIS — C9110 Chronic lymphocytic leukemia of B-cell type not having achieved remission: Secondary | ICD-10-CM | POA: Insufficient documentation

## 2019-08-31 DIAGNOSIS — Z7189 Other specified counseling: Secondary | ICD-10-CM | POA: Insufficient documentation

## 2019-08-31 MED ORDER — ONDANSETRON HCL 8 MG PO TABS: 8.0000 mg | ORAL_TABLET | Freq: Three times a day (TID) | ORAL | 0 refills | Status: DC | PRN

## 2019-08-31 MED ORDER — ALLOPURINOL 300 MG PO TABS: 150.0000 mg | ORAL_TABLET | Freq: Every day | ORAL | 1 refills | Status: DC

## 2019-08-31 NOTE — Progress Notes (Signed)
Patient on plan of care prior to pathways. 

## 2019-08-31 NOTE — Progress Notes (Signed)
START ON PATHWAY REGIMEN - Lymphoma and CLL     Cycle 1: A cycle is 28 days:     Venetoclax      Obinutuzumab      Obinutuzumab      Obinutuzumab    Cycle 2: A cycle is 28 days:     Venetoclax      Venetoclax      Venetoclax      Venetoclax      Obinutuzumab    Cycles 3 through 6: A cycle is 28 days:     Venetoclax      Obinutuzumab    Cycles 7 through 12: A cycle is 28 days:     Venetoclax   **Always confirm dose/schedule in your pharmacy ordering system**  Patient Characteristics: Chronic Lymphocytic Leukemia (CLL), First Line, Treatment Indicated, 17p del (+) or ATM Mutation Positive or TP53 Mutation Positive Disease Type: Chronic Lymphocytic Leukemia (CLL) Disease Type: Not Applicable Disease Type: Not Applicable Line of Therapy: First Line RAI Stage: III Treatment Indicated<= Treatment Indicated ATM Mutation Status: Positive 17p Deletion Status: Negative TP53 Mutation Status: Negative Intent of Therapy: Non-Curative / Palliative Intent, Discussed with Patient 

## 2019-09-02 ENCOUNTER — Inpatient Hospital Stay: Payer: Medicare Other

## 2019-09-02 ENCOUNTER — Other Ambulatory Visit: Payer: Self-pay

## 2019-09-03 ENCOUNTER — Other Ambulatory Visit: Payer: Self-pay | Admitting: *Deleted

## 2019-09-03 ENCOUNTER — Encounter: Payer: Self-pay | Admitting: Hematology

## 2019-09-03 DIAGNOSIS — C9110 Chronic lymphocytic leukemia of B-cell type not having achieved remission: Secondary | ICD-10-CM

## 2019-09-03 DIAGNOSIS — C8300 Small cell B-cell lymphoma, unspecified site: Secondary | ICD-10-CM

## 2019-09-03 NOTE — Progress Notes (Signed)
Called pt to introduce myself as her Arboriculturist.  Pt has 2 insurances so copay assistance shouldn't be needed.  I offered the [employer], went over what it covers and gave her the income requirement.  Pt would like to apply so she will bring her proof of income on 09/04/19.  If approved I will give her an expense sheet and my card for any questions or concerns she may have in the future.

## 2019-09-04 ENCOUNTER — Telehealth: Payer: Self-pay | Admitting: Hematology

## 2019-09-04 ENCOUNTER — Inpatient Hospital Stay: Payer: Medicare Other

## 2019-09-04 ENCOUNTER — Encounter: Payer: Self-pay | Admitting: Hematology

## 2019-09-04 ENCOUNTER — Ambulatory Visit: Payer: Medicare Other

## 2019-09-04 ENCOUNTER — Other Ambulatory Visit: Payer: Self-pay

## 2019-09-04 DIAGNOSIS — C8300 Small cell B-cell lymphoma, unspecified site: Secondary | ICD-10-CM

## 2019-09-04 DIAGNOSIS — C9110 Chronic lymphocytic leukemia of B-cell type not having achieved remission: Secondary | ICD-10-CM

## 2019-09-04 LAB — PHOSPHORUS: Phosphorus: 3.6 mg/dL (ref 2.5–4.6)

## 2019-09-04 LAB — CBC WITH DIFFERENTIAL (CANCER CENTER ONLY)
Abs Immature Granulocytes: 0 10*3/uL (ref 0.00–0.07)
Basophils Absolute: 0 10*3/uL (ref 0.0–0.1)
Basophils Relative: 0 %
Eosinophils Absolute: 0 10*3/uL (ref 0.0–0.5)
Eosinophils Relative: 0 %
HCT: 34.4 % — ABNORMAL LOW (ref 36.0–46.0)
Hemoglobin: 10.5 g/dL — ABNORMAL LOW (ref 12.0–15.0)
Lymphocytes Relative: 88 %
Lymphs Abs: 29.4 10*3/uL — ABNORMAL HIGH (ref 0.7–4.0)
MCH: 28.1 pg (ref 26.0–34.0)
MCHC: 30.5 g/dL (ref 30.0–36.0)
MCV: 92 fL (ref 80.0–100.0)
Monocytes Absolute: 1 10*3/uL (ref 0.1–1.0)
Monocytes Relative: 3 %
Neutro Abs: 3 10*3/uL (ref 1.7–7.7)
Neutrophils Relative %: 9 %
Platelet Count: 295 10*3/uL (ref 150–400)
RBC: 3.74 MIL/uL — ABNORMAL LOW (ref 3.87–5.11)
RDW: 14.7 % (ref 11.5–15.5)
WBC Count: 33.4 10*3/uL — ABNORMAL HIGH (ref 4.0–10.5)
nRBC: 0 % (ref 0.0–0.2)

## 2019-09-04 LAB — CMP (CANCER CENTER ONLY)
ALT: 7 U/L (ref 0–44)
AST: 16 U/L (ref 15–41)
Albumin: 4.2 g/dL (ref 3.5–5.0)
Alkaline Phosphatase: 69 U/L (ref 38–126)
Anion gap: 9 (ref 5–15)
BUN: 26 mg/dL — ABNORMAL HIGH (ref 8–23)
CO2: 25 mmol/L (ref 22–32)
Calcium: 9.5 mg/dL (ref 8.9–10.3)
Chloride: 109 mmol/L (ref 98–111)
Creatinine: 1.25 mg/dL — ABNORMAL HIGH (ref 0.44–1.00)
GFR, Est AFR Am: 50 mL/min — ABNORMAL LOW (ref >60)
GFR, Estimated: 43 mL/min — ABNORMAL LOW (ref >60)
Glucose, Bld: 109 mg/dL — ABNORMAL HIGH (ref 70–99)
Potassium: 4.3 mmol/L (ref 3.5–5.1)
Sodium: 143 mmol/L (ref 135–145)
Total Bilirubin: 0.3 mg/dL (ref 0.3–1.2)
Total Protein: 7.3 g/dL (ref 6.5–8.1)

## 2019-09-04 LAB — HEPATITIS B CORE ANTIBODY, TOTAL: Hep B Core Total Ab: NONREACTIVE

## 2019-09-04 LAB — URIC ACID: Uric Acid, Serum: 6 mg/dL (ref 2.5–7.1)

## 2019-09-04 LAB — LACTATE DEHYDROGENASE: LDH: 403 U/L — ABNORMAL HIGH (ref 98–192)

## 2019-09-04 LAB — HEPATITIS B SURFACE ANTIGEN: Hepatitis B Surface Ag: NONREACTIVE

## 2019-09-04 NOTE — Telephone Encounter (Signed)
Scheduled appt pt appts pwe 2/25 sch message - pt  To get updated schedule when they come in for labs.

## 2019-09-04 NOTE — Progress Notes (Signed)
Pt is approved for the $1000 [employer] grant.  

## 2019-09-07 ENCOUNTER — Other Ambulatory Visit: Payer: Self-pay

## 2019-09-07 ENCOUNTER — Other Ambulatory Visit: Payer: Self-pay | Admitting: *Deleted

## 2019-09-07 ENCOUNTER — Inpatient Hospital Stay: Payer: Medicare Other | Attending: Hematology

## 2019-09-07 ENCOUNTER — Other Ambulatory Visit: Payer: Self-pay | Admitting: Hematology

## 2019-09-07 ENCOUNTER — Inpatient Hospital Stay (HOSPITAL_BASED_OUTPATIENT_CLINIC_OR_DEPARTMENT_OTHER): Payer: Medicare Other | Admitting: Medical

## 2019-09-07 VITALS — BP 144/83 | HR 88 | Temp 98.5°F | Resp 18

## 2019-09-07 DIAGNOSIS — C9110 Chronic lymphocytic leukemia of B-cell type not having achieved remission: Secondary | ICD-10-CM | POA: Insufficient documentation

## 2019-09-07 DIAGNOSIS — Z5112 Encounter for antineoplastic immunotherapy: Secondary | ICD-10-CM | POA: Diagnosis present

## 2019-09-07 DIAGNOSIS — Z7189 Other specified counseling: Secondary | ICD-10-CM

## 2019-09-07 DIAGNOSIS — T8090XA Unspecified complication following infusion and therapeutic injection, initial encounter: Secondary | ICD-10-CM

## 2019-09-07 MED ORDER — DIPHENHYDRAMINE HCL 50 MG/ML IJ SOLN
INTRAMUSCULAR | Status: AC
  Filled 2019-09-07: qty 1

## 2019-09-07 MED ORDER — FAMOTIDINE IN NACL 20-0.9 MG/50ML-% IV SOLN
20.0000 mg | Freq: Once | INTRAVENOUS | Status: AC | PRN
  Administered 2019-09-07: 20 mg via INTRAVENOUS

## 2019-09-07 MED ORDER — DIPHENHYDRAMINE HCL 50 MG/ML IJ SOLN
50.0000 mg | Freq: Once | INTRAMUSCULAR | Status: AC
  Administered 2019-09-07: 50 mg via INTRAVENOUS

## 2019-09-07 MED ORDER — SODIUM CHLORIDE 0.9 % IV SOLN
100.0000 mg | Freq: Once | INTRAVENOUS | Status: AC
  Administered 2019-09-07: 100 mg via INTRAVENOUS
  Filled 2019-09-07: qty 4

## 2019-09-07 MED ORDER — FAMOTIDINE IN NACL 20-0.9 MG/50ML-% IV SOLN
20.0000 mg | Freq: Once | INTRAVENOUS | Status: AC
  Administered 2019-09-07: 20 mg via INTRAVENOUS

## 2019-09-07 MED ORDER — SODIUM CHLORIDE 0.9 % IV SOLN
Freq: Once | INTRAVENOUS | Status: AC
  Filled 2019-09-07: qty 250

## 2019-09-07 MED ORDER — ACETAMINOPHEN 325 MG PO TABS
ORAL_TABLET | ORAL | Status: AC
  Filled 2019-09-07: qty 2

## 2019-09-07 MED ORDER — SODIUM CHLORIDE 0.9 % IV SOLN
20.0000 mg | Freq: Once | INTRAVENOUS | Status: AC
  Administered 2019-09-07: 20 mg via INTRAVENOUS
  Filled 2019-09-07: qty 20

## 2019-09-07 MED ORDER — ACETAMINOPHEN 325 MG PO TABS
650.0000 mg | ORAL_TABLET | Freq: Once | ORAL | Status: AC
  Administered 2019-09-07: 650 mg via ORAL

## 2019-09-07 MED ORDER — FAMOTIDINE IN NACL 20-0.9 MG/50ML-% IV SOLN
INTRAVENOUS | Status: AC
  Filled 2019-09-07: qty 50

## 2019-09-07 NOTE — Progress Notes (Signed)
Shortly after obinutuzumab infusion began, patient began to complain of shortness of breath. Infusion paused immediately. No obvious distress noted. O2 sats >95%. Sandi Mealy PA notified, came to bedside to assess. Additional 20mg  of pepcid administered. Infusion resumed.

## 2019-09-07 NOTE — Progress Notes (Signed)
HEMATOLOGY/ONCOLOGY CLINIC NOTE  Date of Service: 09/08/2019  Patient Care Team: Tower, Wynelle Fanny, MD as PCP - General (Family Medicine) Galvin Proffer, [place] as Consulting Physician (Optometry) Brunetta Genera, MD as Consulting Physician (Hematology)  CHIEF COMPLAINTS/PURPOSE OF CONSULTATION:  Chronic Lymphocytic Leukemia  HISTORY OF PRESENTING ILLNESS:   Erica Carlson is a wonderful 74 y.o. female who has been referred to [place] by my colleague Dr. 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 The pt reports that she is doing well overall.   The pt reports that her SLL was initially picked up tthrough a mammogram in 2017. She notes that her CLL/SLL was stable until December 2019. She notes that she began having feelings of indigestion when she eats. She denies issues with acid reflux. The pt notes that her indigestion began making it difficult to eat, but also endorses a weakened appetite. She lost about 25 pounds, but has gained back 5 pounds. The pt had an unrevealing endoscopy on 07/18/18. She notes that she has some difficulty eating meats. She is staying well hydrated. She has experimented with eating different foods. She endorses having a "lot of gas," and puts a teaspoon of her Mylanta in her coffee every morning.  The pt denies any fevers, chills, or night sweats. She has noticed some lumps in her bilateral neck and armpits. She denies other abdominal pains as such. She denies problems passing urine, nor increased urinary frequency. She denies CP or SOB. She denies leg swelling.   Denies liver, kidney, heart, or lung problems and endorses being generally pretty healthy.  Of note prior to the patient's visit today, pt has had a PET/CT completed on 08/15/18 with results revealing Bulky lymphadenopathy in the neck, chest, abdomen, and pelvis demonstrating low level hypermetabolism throughout. 9 mm right thyroid nodule is hypermetabolic.  Thyroid ultrasound recommended to further evaluate. Mucosal thickening with air-fluid level in the left maxillary sinus. 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  Most recent lab results (09/22/18) of CBC w/diff and BMP is as follows: all values are WNL except for WBC at 16.8k, HGB at 10.9, MCHC at 29.7, ANC at 1.2k, Lymphs abs at 11.3k, Monocytes abs at 4.1k, Creatinine at 1.06.  On review of systems, pt reports frequent indigestion, weakened appetite, weight loss, weaker energy levels, bilateral neck bumps, bilateral armpit bumps, and denies CP, SOB, abdominal pain, leg swelling, fevers, chills, night sweats, lower abdominal pains, changes in bowel habits, and any other symptoms.  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 She is here for C1D2 of [place]. The patient's last visit with [place] was on 08/25/2019. The pt reports that she is doing well overall.  The pt reports that she did have some SOB after her treatment yesterday. She also noticed some fatigue yesterday but was able to sleep well last night. Pt has been drinking 64 oz of water per day but has not begun taking her Allopurinol yet.   Lab results today (09/08/19) of CBC w/diff and CMP is as follows: all values are WNL except for RBC at 3.48, Hgb at 9.7, HCT at 30.9, Neutro Abs at 1.3K, Mono Abs at 0.0K, Abs Immature Granulocytes at 0.10K, WBC Morphology shows "Atypical Lymphs", Smear Review shows "Rare nRBC", CO2 at 21, Glucose at 161, BUN at 31, Creatinine at  1.19, GFR Est Af Am at 53. 09/08/2019 Uric acid at 8.3 09/08/2019 Phosphorus at 3.6   On review of systems, pt reports fatigue, abdominal fullness and denies skin rash, SOB, diarrhea, mouth sores, sleeplessness and any other symptoms.   MEDICAL HISTORY:  Past Medical History:  Diagnosis Date  . Allergy    spring  .  Arthritis    osteo arthritis of right knee  . Cataract    forming  . Hyperlipidemia   . Hypertension   . 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 Pneumonia     SURGICAL HISTORY: Past Surgical History:  Procedure Laterality Date  . ABDOMINAL HYSTERECTOMY    . AXILLARY LYMPH NODE BIOPSY Right 02/07/2016  . back injections    . BREAST SURGERY    . COLONOSCOPY  2009  . EYE SURGERY     retinal tear repair  . KNEE SURGERY     torn ALC per pt    SOCIAL HISTORY: Social History   Socioeconomic History  . Marital status: Widowed    Spouse name: n/a  . Number of children: 6  . Years of education: 52  . Highest education level: Not on file  Occupational History  . 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 Smoking status: Never Smoker  . Smokeless tobacco: Never Used  Substance and Sexual Activity  . Alcohol use: No    Alcohol/week: 0.0 standard drinks  . Drug use: No  . Sexual activity: Never  Other Topics Concern  . Not on file  Social History Narrative   Lives alone.   Her adult children live nearby.   Social Determinants of Health   Financial Resource Strain:   . Difficulty of Paying Living Expenses: Not on file  Food Insecurity:   . Worried About Charity fundraiser in the Last Year: Not on file  . Ran Out of Food in the Last Year: Not on file  Transportation Needs:   . Lack of Transportation (Medical): Not on file  . Lack of Transportation (Non-Medical): Not on file  Physical Activity:   . Days of Exercise per Week: Not on file  . Minutes of Exercise per Session: Not on file  Stress:   . Feeling of Stress : Not on file  Social Connections:   . Frequency of Communication with Friends and Family: Not on file  . Frequency of Social Gatherings with Friends and Family: Not on file  . Attends Religious Services: Not on file  . Active Member of Clubs or Organizations: Not on file  .  Attends Archivist Meetings: Not on file  . Marital Status: Not on file  Intimate Partner Violence:   . Fear of Current or Ex-Partner: Not on file  . Emotionally Abused: Not on file  . Physically Abused: Not on file  . Sexually Abused: Not on file    FAMILY HISTORY: Family History  Problem Relation Age of Onset  . Breast cancer Sister   . Hyperlipidemia Son   . Schizophrenia Mother   . Colon cancer Neg Hx   . Colon polyps Neg Hx   . Esophageal cancer Neg Hx   . Rectal cancer Neg Hx   . Stomach cancer Neg Hx     ALLERGIES:  is allergic to hctz [hydrochlorothiazide] and lipitor [atorvastatin].  MEDICATIONS:  Current Outpatient Medications  Medication Sig Dispense Refill  . allopurinol (  ZYLOPRIM) 300 MG tablet Take 0.5 tablets (150 mg total) by mouth daily. 15 tablet 1  . amLODipine (NORVASC) 10 MG tablet Take 1 tablet (10 mg total) by mouth daily. 30 tablet 11  . Cholecalciferol (VITAMIN D-3) 5000 units TABS Take 1 tablet by mouth daily.    Marland Kitchen lisinopril (ZESTRIL) 10 MG tablet Take 0.5 tablets (5 mg total) by mouth daily. 15 tablet 11  . Multiple Vitamins-Minerals (PRESERVISION AREDS 2 PO) Take by mouth.    . mupirocin ointment (BACTROBAN) 2 % Apply 1 application topically 2 (two) times daily. To sore on buttocks 22 g 1  . ondansetron (ZOFRAN) 8 MG tablet Take 1 tablet (8 mg total) by mouth every 8 (eight) hours as needed for nausea or vomiting. 30 tablet 0  . polyethylene glycol (MIRALAX / GLYCOLAX) 17 g packet Take 17 g by mouth daily.    . rosuvastatin (CRESTOR) 10 MG tablet Take 1 tablet (10 mg total) by mouth daily. Please make overdue appt with Dr. Marlou Porch before anymore refills. 2nd attempt 30 tablet 11   No current facility-administered medications for this visit.    REVIEW OF SYSTEMS:   A 10+ POINT REVIEW OF SYSTEMS WAS OBTAINED including neurology, dermatology, psychiatry, cardiac, respiratory, lymph, extremities, GI, GU, Musculoskeletal, constitutional,  breasts, reproductive, HEENT.  All pertinent positives are noted in the HPI.  All others are negative.    PHYSICAL EXAMINATION: ECOG FS:1 - Symptomatic but completely ambulatory  Vitals:   09/08/19 0909  BP: (!) 155/78  Pulse: 94  Resp: 18  Temp: 98.5 F (36.9 C)  SpO2: 99%   Wt Readings from Last 3 Encounters:  09/08/19 151 lb 11.2 oz (68.8 kg)  08/25/19 151 lb 8 oz (68.7 kg)  07/22/19 152 lb 1 oz (69 kg)   Body mass index is 28.66 kg/m.    Exam was given in a chair   GENERAL:alert, in no acute distress and comfortable SKIN: no acute rashes, no significant lesions  EYES: conjunctiva are pink and non-injected, sclera anicteric OROPHARYNX: MMM, no exudates, no oropharyngeal erythema or ulceration NECK: supple, no JVD LYMPH:  no palpable lymphadenopathy in the axillary or inguinal regions. Palpable cervical lymph nodes b/l. LUNGS: clear to auscultation b/l with normal respiratory effort HEART: regular rate & rhythm ABDOMEN:  normoactive bowel sounds , non tender, not distended. Minimally enlarged liver and spleen. Extremity: no pedal edema PSYCH: alert & oriented x 3 with fluent speech NEURO: no focal motor/sensory deficits  LABORATORY DATA:  I have reviewed the data as listed  . CBC Latest Ref Rng & Units 09/08/2019 09/04/2019 08/05/2019  WBC 4.0 - 10.5 K/uL 5.0 33.4(H) 27.9(H)  Hemoglobin 12.0 - 15.0 g/dL 9.7(L) 10.5(L) 10.2(L)  Hematocrit 36.0 - 46.0 % 30.9(L) 34.4(L) 32.4(L)  Platelets 150 - 400 K/uL 200 295 307    . CMP Latest Ref Rng & Units 09/08/2019 09/04/2019 08/05/2019  Glucose 70 - 99 mg/dL 161(H) 109(H) 89  BUN 8 - 23 mg/dL 31(H) 26(H) 22  Creatinine 0.44 - 1.00 mg/dL 1.19(H) 1.25(H) 1.14(H)  Sodium 135 - 145 mmol/L 142 143 144  Potassium 3.5 - 5.1 mmol/L 4.0 4.3 4.2  Chloride 98 - 111 mmol/L 111 109 109  CO2 22 - 32 mmol/L 21(L) 25 25  Calcium 8.9 - 10.3 mg/dL 9.2 9.5 9.4  Total Protein 6.5 - 8.1 g/dL 6.9 7.3 7.0  Total Bilirubin 0.3 - 1.2 mg/dL 0.3 0.3  0.3  Alkaline Phos 38 - 126 U/L 59 69 61  AST  15 - 41 U/L 23 16 14(L)  ALT 0 - 44 U/L 8 7 7     09/22/18 Right Axillary LN Biopsy:    07/31/18 Molecular Pathology:     02/21/16 Biopsy:    RADIOGRAPHIC STUDIES: I have personally reviewed the radiological images as listed and agreed with the findings in the report. NM PET Image Restag (PS) Skull Base To Thigh  Result Date: 08/12/2019 CLINICAL DATA:  Subsequent treatment strategy for CLL. EXAM: NUCLEAR MEDICINE PET SKULL BASE TO THIGH TECHNIQUE: 7.3 mCi F-18 FDG was injected intravenously. Full-ring PET imaging was performed from the skull base to thigh after the radiotracer. CT data was obtained and used for attenuation correction and anatomic localization. Fasting blood glucose: 83 mg/dl COMPARISON:  08/15/2018 FINDINGS: Mediastinal blood pool activity: SUV max 2.6 Liver activity: SUV max 3.6 NECK: Bulky adenopathy in the bilateral neck throughout all visualized levels. Individual lymph nodes are not discernible. Nodal disease on the left measuring approximately 3.6 x 5.5 cm in greatest axial dimension (image 22, series 4) (SUVmax = 4.1) previous SUV max in the left-sided cervical nodes was 2.9. Parapharyngeal nodal tissue also with marked enlargement. This was involved previously just not enlarged to the same extent as on today's study. All lymph nodes seen in the neck of similar levels of FDG uptake. Intensely hypermetabolic right thyroid lesion (SUVmax = 6.1) grossly similar to previous exam Incidental CT findings: None CHEST: Bulky axillary adenopathy and mediastinal and hilar adenopathy as well as retrocrural and para-aortic adenopathy all increased to a similar extent as in the neck. Right axillary and left axillary lymph nodes with marked enlargement. For example, image 47 of series 4 with a 2.7 cm lymph node, short axis dimension previously 1.6 cm. All visualized lymph nodes with similar enlargement. (SUVmax = 3.9) previously 2.9 Left  axillary adenopathy similarly enlarged (SUVmax = 3.5) 2.3 Pre-vascular lymph node (image 51, series 4) 2.2 cm short axis previously approximately 1.4 cm. Left upper lobe nodule measuring approximately 8 mm with intense FDG uptake (SUVmax = 5.0 not present on the previous exam numerous other areas of nodularity, essentially innumerable all less than a cm scattered throughout both left and right chest showing faint, low level activity less well-defined than this dominant area in the right upper lobe. Incidental CT findings: Uplifting of the thoracic aorta. Signs of vascular disease as before. ABDOMEN/PELVIS: Retrocrural adenopathy extending into the chest with similar enlargement to previous nodal disease as described. Right retrocrural nodal conglomerate measuring 4.1 cm in short axis on today's study previously approximately 1.9 cm. (SUVmax = 3.4 previously undo that) previously 2.8 Bulky left periaortic adenopathy measuring approximately 5.7 x 5.4 cm (image 106, series 4) easier to define discrete nodes in this area, short axis measurement of the largest area on the prior study approximately 1.8 cm to 2 cm (SUVmax = 3.7) previously 2.6 Bulky right pelvic sidewall adenopathy 11 x 8 cm previously 7 x 4.3 cm (SUVmax = 4.3) not previously assessed in terms of S UV max. Bulky adenopathy in the left pelvic sidewall (SUVmax = 5.0) previously 2.9 Uptake in the soft tissues overlying the right gluteal region (SUVmax = 11.0) some skin thickening and or stranding in this location with similar appearance to prior imaging evaluation, subtle area of increased FDG uptake scattered throughout this region on the previous exam. Incidental CT findings: Abdominal aortic atherosclerosis and up lifting of the abdominal aorta now more pronounced than on the prior study. The spleen is mildly enlarged compared to the  previous exam with metabolic activity that is similar to that of adjacent liver. Spleen measuring 10 cm in axial dimension  as compared to 9 cm. Less than 10 cm greatest craniocaudal span. Liver span also mildly enlarged approximately 18 cm as compared to 16 cm greatest craniocaudal dimension. Bowel loops and abdominal structures are displaced by massive retroperitoneal adenopathy. Numerous mesenteric lymph nodes are also demonstrated with similar degree of enlargement. The appendix is normal and there is no acute bowel process. Urinary bladder is displaced due to the marked pelvic adenopathy into the right pelvis. SKELETON: No signs of focal hypermetabolic process to suggest skeletal involvement. Incidental CT findings: Spinal degenerative change without acute or destructive bone process. IMPRESSION: 1. Marked worsening of bulky adenopathy in the neck, chest, abdomen and pelvis and interval development of pulmonary lesions all suspicious for worsening of lymphoproliferative disorder. 2. Size increase and increase in metabolic activity is noted. 3. Right thyroid lesion remains FDG avid though less so than on the prior exam. Suggest thyroid ultrasound if not yet performed for further evaluation. 4. Mild increase in size of both liver and spleen compared to prior study. 5. Marked hypermetabolic activity associated with the skin about the right gluteal region, correlate with physical examination to exclude small skin lesion. Electronically Signed   By: Zetta Bills M.D.   On: 08/12/2019 17:47    ASSESSMENT & PLAN:   74 y.o. female with  1. Chronic Lymphocytic Leukemia  02/21/16 Right Axillary LN biopsy revealed SLL, the initial diagnosis. 07/31/18 FISH CLL Prognostic Panel revealed:52.0% cells with 11q deletion Labs upon initial presentation from 09/22/18, WBC at 16.8k, HGB at 10.9, PLT at 272k. ANC at 1.2k, Lymphs at 11.3k, Monocytes at 4.1k. 09/22/18 Right Axillary LN Biopsy revealed SLL, ruling out a transformation event  08/15/18 PET/CT revealed Bulky lymphadenopathy in the neck, chest, abdomen, and pelvis demonstrating low  level hypermetabolism throughout. 9 mm right thyroid nodule is hypermetabolic. Thyroid ultrasound recommended to further evaluate. Mucosal thickening with air-fluid level in the left maxillary sinus. 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  08/12/2019 PET/CT scan ([id number]) revealed disease progression in neck, chest, abdomen, pelvis and interval development of pulmonary lesions  2. At risk for tumor lysis syndrome -- no evidence of uncontrolled TLS at this time. PLAN: -Discussed pt labwork today, 09/08/19; WBC have normalized, all values are WNL except for RBC at 3.48, Hgb at 9.7, HCT at 30.9, Neutro Abs at 1.3K, Mono Abs at 0.0K, Abs Immature Granulocytes at 0.10K, WBC Morphology shows "Atypical Lymphs", Smear Review shows "Rare nRBC", CO2 at 21, Glucose at 161, BUN at 31, Creatinine at 1.19, GFR Est Af Am at 53. -Discussed 09/08/2019 Uric acid is elevated at 8.3 - will continue to watch for TLS  -Discussed 09/08/2019 Phosphorus is steady  -The pt has no prohibitive toxicities from continuing C1D2 at this time -Advised pt that we increased the dosage of Pepcid and added Singular with C1D2 to help prevent allergic reactions observed with C1D1   -Advised pt that we will continue to watch for fatigue, rashes, diarrhea and other symptoms that could occur with treatment  -Plan to begin Venetoclax with C2 -Recommend pt f/u as scheduled to receive the second dose of the COVID19 vaccine  -Will see back in 2 weeks with labs   FOLLOW UP: Please schedule C1D8 with labs and C1D15 with labs and MD visit Please schedule C2D1 with labs and MD visit   All of the patient's questions were answered with apparent satisfaction.  The patient knows to call the clinic with any problems, questions or concerns.    Sullivan Lone MD [place] [facility] Hematology/Oncology Physician [facility]  (Office):       [phone] (Work cell):  [phone] (Fax):           [phone]   09/08/2019 12:08 PM  I, Yevette Edwards, am acting as a scribe for Dr. Sullivan Lone.   .I have reviewed the above documentation for accuracy and completeness, and I agree with the above. Brunetta Genera MD

## 2019-09-07 NOTE — Progress Notes (Signed)
    DATE:  09/07/2019                                           X  CHEMO/IMMUNOTHERAPY REACTION            MD:  Dr. Sullivan Lone   AGENT/BLOOD [place]:              obinutuzumab   AGENT/BLOOD PRODUCT RECEIVING IMMEDIATELY PRIOR TO REACTION:          obinutuzumab    REACTION(S):           shortness of breath   PREMEDS:      Tylenol 650 mg PO x 1, Benadryl 25 mg IV x 1, Pepcid 20 mg IV x 1, and dexamethasone 20 mg IV x 1   INTERVENTION: Pepcid 20 mg IV x 1   Review of Systems  Review of Systems  Constitutional: Negative for chills, diaphoresis and fever.  HENT: Negative for trouble swallowing.   Respiratory: Positive for shortness of breath. Negative for cough, choking, chest tightness, wheezing and stridor.   Cardiovascular: Negative for chest pain and palpitations.  Gastrointestinal: Negative for nausea and vomiting.     Physical Exam  Physical Exam Constitutional:      General: She is not in acute distress.    Appearance: She is not diaphoretic.  HENT:     Head: Normocephalic and atraumatic.  Cardiovascular:     Rate and Rhythm: Normal rate and regular rhythm.     Heart sounds: Normal heart sounds. No murmur. No friction rub. No gallop.   Pulmonary:     Effort: Pulmonary effort is normal. No respiratory distress.     Breath sounds: Normal breath sounds. No stridor. No wheezing or rales.  Skin:    General: Skin is warm and dry.     Coloration: Skin is not pale.     Findings: No erythema.  Neurological:     Mental Status: She is alert.  Psychiatric:        Behavior: Behavior normal.        Thought Content: Thought content normal.        Judgment: Judgment normal.     OUTCOME:                The patient's symptoms abated after obinutuzumab was paused and she was dosed with Pepcid 20 mg IV. She was able to complete the remainder of her infusion without additional issues of concern.   Sandi Mealy, MHS, PA-C

## 2019-09-07 NOTE — Patient Instructions (Signed)
[facility] Discharge Instructions for Patients Receiving Chemotherapy  Today you received the following chemotherapy agents: obinutuzumab.  To help prevent nausea and vomiting after your treatment, we encourage you to take your nausea medication as directed.   If you develop nausea and vomiting that is not controlled by your nausea medication, call the clinic.   BELOW ARE SYMPTOMS THAT SHOULD BE REPORTED IMMEDIATELY:  *FEVER GREATER THAN 100.5 F  *CHILLS WITH OR WITHOUT FEVER  NAUSEA AND VOMITING THAT IS NOT CONTROLLED WITH YOUR NAUSEA MEDICATION  *UNUSUAL SHORTNESS OF BREATH  *UNUSUAL BRUISING OR BLEEDING  TENDERNESS IN MOUTH AND THROAT WITH OR WITHOUT PRESENCE OF ULCERS  *URINARY PROBLEMS  *BOWEL PROBLEMS  UNUSUAL RASH Items with * indicate a potential emergency and should be followed up as soon as possible.  Feel free to call the clinic should you have any questions or concerns. The clinic phone number is [phone].  Please show the [facility] at check-in to the Emergency Department and triage nurse.  Obinutuzumab injection What is this medicine? OBINUTUZUMAB (OH bi nue TOOZ ue mab) is a monoclonal antibody. It is used to treat chronic lymphocytic leukemia (CLL) and a type of non-Hodgkin lymphoma (NHL), follicular lymphoma. This medicine may be used for other purposes; ask your health care provider or pharmacist if you have questions. COMMON BRAND NAME(S): GAZYVA What should I tell my health care provider before I take this medicine? They need to know if you have any of these conditions:  infection (especially a virus infection such as hepatitis B virus)  lung or breathing disease  heart disease  take medicines that treat or prevent blood clots  an unusual or allergic reaction to obinutuzumab, other medicines, foods, dyes, or preservatives  pregnant or trying to get pregnant  breast-feeding How should I use this medicine? This  medicine is for infusion into a vein. It is given by a health care professional in a hospital or clinic setting. Talk to your pediatrician regarding the use of this medicine in children. Special care may be needed. Overdosage: If you think you have taken too much of this medicine contact a poison control center or emergency room at once. NOTE: This medicine is only for you. Do not share this medicine with others. What if I miss a dose? Keep appointments for follow-up doses as directed. It is important not to miss your dose. Call your doctor or health care professional if you are unable to keep an appointment. What may interact with this medicine?  live virus vaccines This list may not describe all possible interactions. Give your health care provider a list of all the medicines, herbs, non-prescription drugs, or dietary supplements you use. Also tell them if you smoke, drink alcohol, or use illegal drugs. Some items may interact with your medicine. What should I watch for while using this medicine? Report any side effects that you notice during your treatment right away, such as changes in your breathing, fever, chills, dizziness or lightheadedness. These effects are more common with the first dose. Visit your prescriber or health care professional for checks on your progress. You will need to have regular blood work. Report any other side effects. The side effects of this medicine can continue after you finish your treatment. Continue your course of treatment even though you feel ill unless your doctor tells you to stop. Call your doctor or health care professional for advice if you get a fever, chills or sore throat, or other  symptoms of a cold or flu. Do not treat yourself. This drug decreases your body's ability to fight infections. Try to avoid being around people who are sick. This medicine may increase your risk to bruise or bleed. Call your doctor or health care professional if you notice any  unusual bleeding. Do not become pregnant while taking this medicine or for 6 months after stopping it. Women should inform their doctor if they wish to become pregnant or think they might be pregnant. There is a potential for serious side effects to an unborn child. Talk to your health care professional or pharmacist for more information. Do not breast-feed an infant while taking this medicine or for 6 months after stopping it. What side effects may I notice from receiving this medicine? Side effects that you should report to your doctor or health care professional as soon as possible:  allergic reactions like skin rash, itching or hives, swelling of the face, lips, or tongue  breathing problems  changes in vision  chest pain or chest tightness  confusion  dizziness  loss of balance or coordination  low blood counts - this medicine may decrease the number of white blood cells, red blood cells and platelets. You may be at increased risk for infections and bleeding.  signs of decreased platelets or bleeding - bruising, pinpoint red spots on the skin, black, tarry stools, blood in the urine  signs of infection - fever or chills, cough, sore throat, pain or trouble passing urine  signs and symptoms of liver injury like dark yellow or brown urine; general ill feeling or flu-like symptoms; light-colored stools; loss of appetite; nausea; right upper belly pain; unusually weak or tired; yellowing of the eyes or skin  trouble speaking or understanding  trouble walking  vomiting Side effects that usually do not require medical attention (report to your doctor or health care professional if they continue or are bothersome):  constipation  joint pain  muscle pain This list may not describe all possible side effects. Call your doctor for medical advice about side effects. You may report side effects to FDA at 1-800-FDA-1088. Where should I keep my medicine? This drug is only given in a  hospital or clinic and will not be stored at home. NOTE: This sheet is a summary. It may not cover all possible information. If you have questions about this medicine, talk to your doctor, pharmacist, or health care provider.  2020 Elsevier/Gold Standard (2018-10-07 15:34:53)

## 2019-09-08 ENCOUNTER — Inpatient Hospital Stay: Payer: Medicare Other | Admitting: Hematology

## 2019-09-08 ENCOUNTER — Inpatient Hospital Stay: Payer: Medicare Other

## 2019-09-08 ENCOUNTER — Other Ambulatory Visit: Payer: Self-pay

## 2019-09-08 VITALS — BP 128/70 | HR 70 | Temp 98.6°F | Resp 16

## 2019-09-08 VITALS — BP 155/78 | HR 94 | Temp 98.5°F | Resp 18 | Ht 61.0 in | Wt 151.7 lb

## 2019-09-08 DIAGNOSIS — Z9189 Other specified personal risk factors, not elsewhere classified: Secondary | ICD-10-CM | POA: Diagnosis not present

## 2019-09-08 DIAGNOSIS — Z7189 Other specified counseling: Secondary | ICD-10-CM

## 2019-09-08 DIAGNOSIS — Z5112 Encounter for antineoplastic immunotherapy: Secondary | ICD-10-CM

## 2019-09-08 DIAGNOSIS — C9110 Chronic lymphocytic leukemia of B-cell type not having achieved remission: Secondary | ICD-10-CM

## 2019-09-08 LAB — CMP (CANCER CENTER ONLY)
ALT: 8 U/L (ref 0–44)
AST: 23 U/L (ref 15–41)
Albumin: 3.9 g/dL (ref 3.5–5.0)
Alkaline Phosphatase: 59 U/L (ref 38–126)
Anion gap: 10 (ref 5–15)
BUN: 31 mg/dL — ABNORMAL HIGH (ref 8–23)
CO2: 21 mmol/L — ABNORMAL LOW (ref 22–32)
Calcium: 9.2 mg/dL (ref 8.9–10.3)
Chloride: 111 mmol/L (ref 98–111)
Creatinine: 1.19 mg/dL — ABNORMAL HIGH (ref 0.44–1.00)
GFR, Est AFR Am: 53 mL/min — ABNORMAL LOW (ref >60)
GFR, Estimated: 46 mL/min — ABNORMAL LOW (ref >60)
Glucose, Bld: 161 mg/dL — ABNORMAL HIGH (ref 70–99)
Potassium: 4 mmol/L (ref 3.5–5.1)
Sodium: 142 mmol/L (ref 135–145)
Total Bilirubin: 0.3 mg/dL (ref 0.3–1.2)
Total Protein: 6.9 g/dL (ref 6.5–8.1)

## 2019-09-08 LAB — CBC WITH DIFFERENTIAL (CANCER CENTER ONLY)
Abs Immature Granulocytes: 0.1 10*3/uL — ABNORMAL HIGH (ref 0.00–0.07)
Band Neutrophils: 6 %
Basophils Absolute: 0 10*3/uL (ref 0.0–0.1)
Basophils Relative: 0 %
Eosinophils Absolute: 0 10*3/uL (ref 0.0–0.5)
Eosinophils Relative: 0 %
HCT: 30.9 % — ABNORMAL LOW (ref 36.0–46.0)
Hemoglobin: 9.7 g/dL — ABNORMAL LOW (ref 12.0–15.0)
Lymphocytes Relative: 72 %
Lymphs Abs: 3.6 10*3/uL (ref 0.7–4.0)
MCH: 27.9 pg (ref 26.0–34.0)
MCHC: 31.4 g/dL (ref 30.0–36.0)
MCV: 88.8 fL (ref 80.0–100.0)
Metamyelocytes Relative: 1 %
Monocytes Absolute: 0 10*3/uL — ABNORMAL LOW (ref 0.1–1.0)
Monocytes Relative: 0 %
Myelocytes: 1 %
Neutro Abs: 1.3 10*3/uL — ABNORMAL LOW (ref 1.7–7.7)
Neutrophils Relative %: 20 %
Platelet Count: 200 10*3/uL (ref 150–400)
RBC: 3.48 MIL/uL — ABNORMAL LOW (ref 3.87–5.11)
RDW: 14.8 % (ref 11.5–15.5)
WBC Count: 5 10*3/uL (ref 4.0–10.5)
nRBC: 0 % (ref 0.0–0.2)

## 2019-09-08 LAB — URIC ACID: Uric Acid, Serum: 8.3 mg/dL — ABNORMAL HIGH (ref 2.5–7.1)

## 2019-09-08 LAB — PHOSPHORUS: Phosphorus: 3.6 mg/dL (ref 2.5–4.6)

## 2019-09-08 MED ORDER — ACETAMINOPHEN 325 MG PO TABS
ORAL_TABLET | ORAL | Status: AC
  Filled 2019-09-08: qty 2

## 2019-09-08 MED ORDER — SODIUM CHLORIDE 0.9 % IV SOLN
Freq: Once | INTRAVENOUS | Status: AC
  Filled 2019-09-08: qty 250

## 2019-09-08 MED ORDER — DIPHENHYDRAMINE HCL 50 MG/ML IJ SOLN
INTRAMUSCULAR | Status: AC
  Filled 2019-09-08: qty 1

## 2019-09-08 MED ORDER — MONTELUKAST SODIUM 10 MG PO TABS
10.0000 mg | ORAL_TABLET | Freq: Once | ORAL | Status: AC
  Administered 2019-09-08: 10 mg via ORAL

## 2019-09-08 MED ORDER — SODIUM CHLORIDE 0.9 % IV SOLN
900.0000 mg | Freq: Once | INTRAVENOUS | Status: AC
  Administered 2019-09-08: 11:00:00 900 mg via INTRAVENOUS
  Filled 2019-09-08: qty 36

## 2019-09-08 MED ORDER — MONTELUKAST SODIUM 10 MG PO TABS
ORAL_TABLET | ORAL | Status: AC
  Filled 2019-09-08: qty 1

## 2019-09-08 MED ORDER — DIPHENHYDRAMINE HCL 50 MG/ML IJ SOLN
50.0000 mg | Freq: Once | INTRAMUSCULAR | Status: AC
  Administered 2019-09-08: 50 mg via INTRAVENOUS

## 2019-09-08 MED ORDER — SODIUM CHLORIDE 0.9 % IV SOLN
40.0000 mg | Freq: Once | INTRAVENOUS | Status: AC
  Administered 2019-09-08: 40 mg via INTRAVENOUS
  Filled 2019-09-08: qty 4

## 2019-09-08 MED ORDER — SODIUM CHLORIDE 0.9 % IV SOLN
20.0000 mg | Freq: Once | INTRAVENOUS | Status: AC
  Administered 2019-09-08: 20 mg via INTRAVENOUS
  Filled 2019-09-08: qty 20

## 2019-09-08 MED ORDER — ACETAMINOPHEN 325 MG PO TABS
650.0000 mg | ORAL_TABLET | Freq: Once | ORAL | Status: AC
  Administered 2019-09-08: 650 mg via ORAL

## 2019-09-08 NOTE — Progress Notes (Signed)
Patient received D2C1 obinutuzumab today. Tolerated infusion with no adverse effects.

## 2019-09-08 NOTE — Progress Notes (Signed)
Spoke w/ Dr. Irene Limbo - famotidine increased to 40mg  and montelukast added to premedications for C1D2 onwards for the shortness of breath experienced w/ C1D1.   Demetrius Charity, PharmD, [facility] Pharmacist Pharmacy Phone: [phone] 09/08/2019

## 2019-09-09 ENCOUNTER — Telehealth: Payer: Self-pay | Admitting: *Deleted

## 2019-09-09 NOTE — Telephone Encounter (Signed)
Patient called - she completed 2nd day Gazyva yesterday. Has ache in lower back - could be from sitting in infusion recliner 2 days in a row, she's not sure. Wants to know if she can take either Tylenol or Advil? Dr. Irene Limbo informed. Dr. Irene Limbo response: okay to take tylenol, follow directions  Contacted patient with Dr. Irene Limbo response. Pt verbalized understanding.

## 2019-09-11 ENCOUNTER — Telehealth: Payer: Self-pay | Admitting: Hematology

## 2019-09-11 NOTE — Progress Notes (Signed)
Pharmacist Chemotherapy Monitoring - Follow Up Assessment    I verify that I have reviewed each item in the below checklist:  Regimen:  . Patient due for treatment based on regimen frequency. . Plan date matches the patient's scheduled date. . Premedications are appropriate for the patient's regimen. . Assessed if patient has a supportive care therapy plans, and if patient is due for treatment.  Organ Function and Labs: . Appropriate labs and drug-specific monitoring are ordered prior to the patient's treatment.  Dose Assessment: . If applicable to patient's regimen, lifetime cumulative doses have been properly documented and assessed.  Toxicity Monitoring/Prevention: . Medication allergies and previous infusion related reactions, if applicable, have been reviewed and addressed.  Order Review: . Treatment plan orders signed and/or patient is scheduled to see a provider prior to their treatment.  Plan for follow-up and/or issues identified: . No . I-vent associated with next due treatment: No . MD and/or nursing notified: No  Erica Carlson 09/11/2019 2:16 PM

## 2019-09-11 NOTE — Telephone Encounter (Signed)
Called patient regarding 03/08 appointment, patient is notified.

## 2019-09-14 ENCOUNTER — Inpatient Hospital Stay: Payer: Medicare Other

## 2019-09-14 ENCOUNTER — Inpatient Hospital Stay (HOSPITAL_BASED_OUTPATIENT_CLINIC_OR_DEPARTMENT_OTHER): Payer: Medicare Other | Admitting: Hematology

## 2019-09-14 ENCOUNTER — Other Ambulatory Visit: Payer: Self-pay

## 2019-09-14 VITALS — BP 128/76 | HR 69 | Temp 98.2°F | Resp 18 | Wt 150.0 lb

## 2019-09-14 DIAGNOSIS — C9110 Chronic lymphocytic leukemia of B-cell type not having achieved remission: Secondary | ICD-10-CM

## 2019-09-14 DIAGNOSIS — Z9189 Other specified personal risk factors, not elsewhere classified: Secondary | ICD-10-CM

## 2019-09-14 DIAGNOSIS — Z5112 Encounter for antineoplastic immunotherapy: Secondary | ICD-10-CM

## 2019-09-14 DIAGNOSIS — Z7189 Other specified counseling: Secondary | ICD-10-CM

## 2019-09-14 LAB — CBC WITH DIFFERENTIAL/PLATELET
Abs Immature Granulocytes: 0.04 10*3/uL (ref 0.00–0.07)
Basophils Absolute: 0 10*3/uL (ref 0.0–0.1)
Basophils Relative: 0 %
Eosinophils Absolute: 0.1 10*3/uL (ref 0.0–0.5)
Eosinophils Relative: 5 %
HCT: 33 % — ABNORMAL LOW (ref 36.0–46.0)
Hemoglobin: 10.4 g/dL — ABNORMAL LOW (ref 12.0–15.0)
Immature Granulocytes: 1 %
Lymphocytes Relative: 69 %
Lymphs Abs: 2.1 10*3/uL (ref 0.7–4.0)
MCH: 27.5 pg (ref 26.0–34.0)
MCHC: 31.5 g/dL (ref 30.0–36.0)
MCV: 87.3 fL (ref 80.0–100.0)
Monocytes Absolute: 0.1 10*3/uL (ref 0.1–1.0)
Monocytes Relative: 3 %
Neutro Abs: 0.7 10*3/uL — ABNORMAL LOW (ref 1.7–7.7)
Neutrophils Relative %: 22 %
Platelets: 192 10*3/uL (ref 150–400)
RBC: 3.78 MIL/uL — ABNORMAL LOW (ref 3.87–5.11)
RDW: 14.4 % (ref 11.5–15.5)
WBC: 3.1 10*3/uL — ABNORMAL LOW (ref 4.0–10.5)
nRBC: 0 % (ref 0.0–0.2)

## 2019-09-14 LAB — CMP (CANCER CENTER ONLY)
ALT: 9 U/L (ref 0–44)
AST: 15 U/L (ref 15–41)
Albumin: 3.7 g/dL (ref 3.5–5.0)
Alkaline Phosphatase: 51 U/L (ref 38–126)
Anion gap: 9 (ref 5–15)
BUN: 24 mg/dL — ABNORMAL HIGH (ref 8–23)
CO2: 23 mmol/L (ref 22–32)
Calcium: 9.3 mg/dL (ref 8.9–10.3)
Chloride: 108 mmol/L (ref 98–111)
Creatinine: 1.22 mg/dL — ABNORMAL HIGH (ref 0.44–1.00)
GFR, Est AFR Am: 51 mL/min — ABNORMAL LOW (ref >60)
GFR, Estimated: 44 mL/min — ABNORMAL LOW (ref >60)
Glucose, Bld: 178 mg/dL — ABNORMAL HIGH (ref 70–99)
Potassium: 4.3 mmol/L (ref 3.5–5.1)
Sodium: 140 mmol/L (ref 135–145)
Total Bilirubin: 0.3 mg/dL (ref 0.3–1.2)
Total Protein: 6.6 g/dL (ref 6.5–8.1)

## 2019-09-14 LAB — URIC ACID: Uric Acid, Serum: 5.7 mg/dL (ref 2.5–7.1)

## 2019-09-14 LAB — MAGNESIUM: Magnesium: 1.8 mg/dL (ref 1.7–2.4)

## 2019-09-14 LAB — PHOSPHORUS: Phosphorus: 3.4 mg/dL (ref 2.5–4.6)

## 2019-09-14 MED ORDER — ACETAMINOPHEN 325 MG PO TABS
ORAL_TABLET | ORAL | Status: AC
  Filled 2019-09-14: qty 2

## 2019-09-14 MED ORDER — MONTELUKAST SODIUM 10 MG PO TABS
10.0000 mg | ORAL_TABLET | Freq: Once | ORAL | Status: AC
  Administered 2019-09-14: 10 mg via ORAL

## 2019-09-14 MED ORDER — DIPHENHYDRAMINE HCL 50 MG/ML IJ SOLN
50.0000 mg | Freq: Once | INTRAMUSCULAR | Status: AC
  Administered 2019-09-14: 50 mg via INTRAVENOUS

## 2019-09-14 MED ORDER — SODIUM CHLORIDE 0.9 % IV SOLN
20.0000 mg | Freq: Once | INTRAVENOUS | Status: AC
  Administered 2019-09-14: 20 mg via INTRAVENOUS
  Filled 2019-09-14: qty 20

## 2019-09-14 MED ORDER — SODIUM CHLORIDE 0.9 % IV SOLN
40.0000 mg | Freq: Once | INTRAVENOUS | Status: AC
  Administered 2019-09-14: 40 mg via INTRAVENOUS
  Filled 2019-09-14: qty 4

## 2019-09-14 MED ORDER — ACETAMINOPHEN 325 MG PO TABS
650.0000 mg | ORAL_TABLET | Freq: Once | ORAL | Status: AC
  Administered 2019-09-14: 650 mg via ORAL

## 2019-09-14 MED ORDER — MONTELUKAST SODIUM 10 MG PO TABS
ORAL_TABLET | ORAL | Status: AC
  Filled 2019-09-14: qty 1

## 2019-09-14 MED ORDER — DIPHENHYDRAMINE HCL 50 MG/ML IJ SOLN
INTRAMUSCULAR | Status: AC
  Filled 2019-09-14: qty 1

## 2019-09-14 MED ORDER — SODIUM CHLORIDE 0.9 % IV SOLN
1000.0000 mg | Freq: Once | INTRAVENOUS | Status: AC
  Administered 2019-09-14: 1000 mg via INTRAVENOUS
  Filled 2019-09-14: qty 40

## 2019-09-14 MED ORDER — SODIUM CHLORIDE 0.9 % IV SOLN
Freq: Once | INTRAVENOUS | Status: AC
  Filled 2019-09-14: qty 250

## 2019-09-14 NOTE — Patient Instructions (Signed)
[facility] Discharge Instructions for Patients Receiving Chemotherapy  Today you received the following chemotherapy agent: Obinutuzumab.  To help prevent nausea and vomiting after your treatment, we encourage you to take your nausea medication as directed.   If you develop nausea and vomiting that is not controlled by your nausea medication, call the clinic.   BELOW ARE SYMPTOMS THAT SHOULD BE REPORTED IMMEDIATELY:  *FEVER GREATER THAN 100.5 F  *CHILLS WITH OR WITHOUT FEVER  NAUSEA AND VOMITING THAT IS NOT CONTROLLED WITH YOUR NAUSEA MEDICATION  *UNUSUAL SHORTNESS OF BREATH  *UNUSUAL BRUISING OR BLEEDING  TENDERNESS IN MOUTH AND THROAT WITH OR WITHOUT PRESENCE OF ULCERS  *URINARY PROBLEMS  *BOWEL PROBLEMS  UNUSUAL RASH Items with * indicate a potential emergency and should be followed up as soon as possible.  Feel free to call the clinic should you have any questions or concerns. The clinic phone number is [phone].  Please show the [facility] at check-in to the Emergency Department and triage nurse.

## 2019-09-14 NOTE — Progress Notes (Signed)
HEMATOLOGY/ONCOLOGY CLINIC NOTE  Date of Service: 09/14/2019  Patient Care Team: Tower, Wynelle Fanny, MD as PCP - General (Family Medicine) Galvin Proffer, Axis as Consulting Physician (Optometry) Brunetta Genera, MD as Consulting Physician (Hematology)  CHIEF COMPLAINTS/PURPOSE OF CONSULTATION:  Chronic Lymphocytic Leukemia  HISTORY OF PRESENTING ILLNESS:   Erica Carlson is a wonderful 74 y.o. female who has been referred to [place] by my colleague Dr. 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 The pt reports that she is doing well overall.   The pt reports that her SLL was initially picked up tthrough a mammogram in 2017. She notes that her CLL/SLL was stable until December 2019. She notes that she began having feelings of indigestion when she eats. She denies issues with acid reflux. The pt notes that her indigestion began making it difficult to eat, but also endorses a weakened appetite. She lost about 25 pounds, but has gained back 5 pounds. The pt had an unrevealing endoscopy on 07/18/18. She notes that she has some difficulty eating meats. She is staying well hydrated. She has experimented with eating different foods. She endorses having a "lot of gas," and puts a teaspoon of her Mylanta in her coffee every morning.  The pt denies any fevers, chills, or night sweats. She has noticed some lumps in her bilateral neck and armpits. She denies other abdominal pains as such. She denies problems passing urine, nor increased urinary frequency. She denies CP or SOB. She denies leg swelling.   Denies liver, kidney, heart, or lung problems and endorses being generally pretty healthy.  Of note prior to the patient's visit today, pt has had a PET/CT completed on 08/15/18 with results revealing Bulky lymphadenopathy in the neck, chest, abdomen, and pelvis demonstrating low level hypermetabolism throughout. 9 mm right thyroid nodule is hypermetabolic.  Thyroid ultrasound recommended to further evaluate. Mucosal thickening with air-fluid level in the left maxillary sinus. 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  Most recent lab results (09/22/18) of CBC w/diff and BMP is as follows: all values are WNL except for WBC at 16.8k, HGB at 10.9, MCHC at 29.7, ANC at 1.2k, Lymphs abs at 11.3k, Monocytes abs at 4.1k, Creatinine at 1.06.  On review of systems, pt reports frequent indigestion, weakened appetite, weight loss, weaker energy levels, bilateral neck bumps, bilateral armpit bumps, and denies CP, SOB, abdominal pain, leg swelling, fevers, chills, night sweats, lower abdominal pains, changes in bowel habits, and any other symptoms.  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 She is here for C1D8 of Gazyva. The patient's last visit with [place] was on 09/08/2019. The pt reports that she is doing well overall.  The pt reports that she has been feeling light headed and dizzy when standing up since beginning treatment. Pt has continued to drink 48-64 oz of water per day and is eating 4-5 small meals per day. She has noticed a reduction in the size of her cervical lymph nodes. Her daughters and friend give help her at home. Pt has noticed that she has less energy but nothing that is hindering her from doing her daily activities.   Lab results today (09/14/19) of CBC w/diff and CMP is as follows: all values are WNL except for WBC at 3.1K, RBC at 3.78, Hgb at 10.4, HCT  at 33.0, Neutro Abs at 0.7K, WBC Morphology shows "Variant Lymphs Present", Glucose at 178, BUN at 24, Creatinine at 1.22, GFR Est Af Am at 51. 09/14/2019 Uric acid at 5.7 09/14/2019 Magnesium at 1.8 09/14/2019 Phosphorus at 3.4  On review of systems, pt reports lightheadedness, dizziness, fatigue and denies fevers, rashes, SOB, bowel  habit changes, constipation, abdominal pain, new lumps/bumps and any other symptoms.   MEDICAL HISTORY:  Past Medical History:  Diagnosis Date  . Allergy    spring  . Arthritis    osteo arthritis of right knee  . Cataract    forming  . Hyperlipidemia   . Hypertension   . 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 Pneumonia     SURGICAL HISTORY: Past Surgical History:  Procedure Laterality Date  . ABDOMINAL HYSTERECTOMY    . AXILLARY LYMPH NODE BIOPSY Right 02/07/2016  . back injections    . BREAST SURGERY    . COLONOSCOPY  2009  . EYE SURGERY     retinal tear repair  . KNEE SURGERY     torn ALC per pt    SOCIAL HISTORY: Social History   Socioeconomic History  . Marital status: Widowed    Spouse name: n/a  . Number of children: 6  . Years of education: 65  . Highest education level: Not on file  Occupational History  . 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 Smoking status: Never Smoker  . Smokeless tobacco: Never Used  Substance and Sexual Activity  . Alcohol use: No    Alcohol/week: 0.0 standard drinks  . Drug use: No  . Sexual activity: Never  Other Topics Concern  . Not on file  Social History Narrative   Lives alone.   Her adult children live nearby.   Social Determinants of Health   Financial Resource Strain:   . Difficulty of Paying Living Expenses: Not on file  Food Insecurity:   . Worried About Charity fundraiser in the Last Year: Not on file  . Ran Out of Food in the Last Year: Not on file  Transportation Needs:   . Lack of Transportation (Medical): Not on file  . Lack of Transportation (Non-Medical): Not on file  Physical Activity:   . Days of Exercise per Week: Not on file  . Minutes of Exercise per Session: Not on file  Stress:   . Feeling of Stress : Not on file  Social Connections:   . Frequency of Communication with Friends and Family: Not on file  .  Frequency of Social Gatherings with Friends and Family: Not on file  . Attends Religious Services: Not on file  . Active Member of Clubs or Organizations: Not on file  . Attends Archivist Meetings: Not on file  . Marital Status: Not on file  Intimate Partner Violence:   . Fear of Current or Ex-Partner: Not on file  . Emotionally Abused: Not on file  . Physically Abused: Not on file  . Sexually Abused: Not on file    FAMILY HISTORY: Family History  Problem Relation Age of Onset  . Breast cancer Sister   . Hyperlipidemia Son   . Schizophrenia Mother   . Colon cancer Neg Hx   . Colon polyps Neg Hx   . Esophageal cancer Neg Hx   . Rectal cancer Neg Hx   . Stomach cancer Neg Hx  ALLERGIES:  is allergic to hctz [hydrochlorothiazide] and lipitor [atorvastatin].  MEDICATIONS:  Current Outpatient Medications  Medication Sig Dispense Refill  . allopurinol (ZYLOPRIM) 300 MG tablet Take 0.5 tablets (150 mg total) by mouth daily. 15 tablet 1  . amLODipine (NORVASC) 10 MG tablet Take 1 tablet (10 mg total) by mouth daily. 30 tablet 11  . Cholecalciferol (VITAMIN D-3) 5000 units TABS Take 1 tablet by mouth daily.    Marland Kitchen lisinopril (ZESTRIL) 10 MG tablet Take 0.5 tablets (5 mg total) by mouth daily. 15 tablet 11  . Multiple Vitamins-Minerals (PRESERVISION AREDS 2 PO) Take by mouth.    . mupirocin ointment (BACTROBAN) 2 % Apply 1 application topically 2 (two) times daily. To sore on buttocks 22 g 1  . ondansetron (ZOFRAN) 8 MG tablet Take 1 tablet (8 mg total) by mouth every 8 (eight) hours as needed for nausea or vomiting. 30 tablet 0  . polyethylene glycol (MIRALAX / GLYCOLAX) 17 g packet Take 17 g by mouth daily.    . rosuvastatin (CRESTOR) 10 MG tablet Take 1 tablet (10 mg total) by mouth daily. Please make overdue appt with Dr. Marlou Porch before anymore refills. 2nd attempt 30 tablet 11   No current facility-administered medications for this visit.    REVIEW OF SYSTEMS:   A  10+ POINT REVIEW OF SYSTEMS WAS OBTAINED including neurology, dermatology, psychiatry, cardiac, respiratory, lymph, extremities, GI, GU, Musculoskeletal, constitutional, breasts, reproductive, HEENT.  All pertinent positives are noted in the HPI.  All others are negative.   PHYSICAL EXAMINATION: ECOG FS:1 - Symptomatic but completely ambulatory  There were no vitals filed for this visit. Wt Readings from Last 3 Encounters:  09/14/19 150 lb (68 kg)  09/08/19 151 lb 11.2 oz (68.8 kg)  08/25/19 151 lb 8 oz (68.7 kg)   There is no height or weight on file to calculate BMI.    Exam was given in a chair   GENERAL:alert, in no acute distress and comfortable SKIN: no acute rashes, no significant lesions EYES: conjunctiva are pink and non-injected, sclera anicteric OROPHARYNX: MMM, no exudates, no oropharyngeal erythema or ulceration NECK: supple, no JVD LYMPH:  no palpable lymphadenopathy in the axillary or inguinal regions. Palpable cervical lymph nodes b/l, improved from last visit. LUNGS: clear to auscultation b/l with normal respiratory effort HEART: regular rate & rhythm ABDOMEN:  normoactive bowel sounds , non tender, not distended. No palpable hepatosplenomegaly.  Minimally enlarged liver and spleen. Extremity: no pedal edema PSYCH: alert & oriented x 3 with fluent speech NEURO: no focal motor/sensory deficits  LABORATORY DATA:  I have reviewed the data as listed  . CBC Latest Ref Rng & Units 09/14/2019 09/08/2019 09/04/2019  WBC 4.0 - 10.5 K/uL 3.1(L) 5.0 33.4(H)  Hemoglobin 12.0 - 15.0 g/dL 10.4(L) 9.7(L) 10.5(L)  Hematocrit 36.0 - 46.0 % 33.0(L) 30.9(L) 34.4(L)  Platelets 150 - 400 K/uL 192 200 295    . CMP Latest Ref Rng & Units 09/14/2019 09/08/2019 09/04/2019  Glucose 70 - 99 mg/dL 178(H) 161(H) 109(H)  BUN 8 - 23 mg/dL 24(H) 31(H) 26(H)  Creatinine 0.44 - 1.00 mg/dL 1.22(H) 1.19(H) 1.25(H)  Sodium 135 - 145 mmol/L 140 142 143  Potassium 3.5 - 5.1 mmol/L 4.3 4.0 4.3    Chloride 98 - 111 mmol/L 108 111 109  CO2 22 - 32 mmol/L 23 21(L) 25  Calcium 8.9 - 10.3 mg/dL 9.3 9.2 9.5  Total Protein 6.5 - 8.1 g/dL 6.6 6.9 7.3  Total Bilirubin 0.3 - 1.2  mg/dL 0.3 0.3 0.3  Alkaline Phos 38 - 126 U/L 51 59 69  AST 15 - 41 U/L 15 23 16   ALT 0 - 44 U/L 9 8 7     09/22/18 Right Axillary LN Biopsy:    07/31/18 Molecular Pathology:     02/21/16 Biopsy:    RADIOGRAPHIC STUDIES: I have personally reviewed the radiological images as listed and agreed with the findings in the report. No results found.  ASSESSMENT & PLAN:   74 y.o. female with  1. Chronic Lymphocytic Leukemia  02/21/16 Right Axillary LN biopsy revealed SLL, the initial diagnosis. 07/31/18 FISH CLL Prognostic Panel revealed:52.0% cells with 11q deletion Labs upon initial presentation from 09/22/18, WBC at 16.8k, HGB at 10.9, PLT at 272k. ANC at 1.2k, Lymphs at 11.3k, Monocytes at 4.1k. 09/22/18 Right Axillary LN Biopsy revealed SLL, ruling out a transformation event  08/15/18 PET/CT revealed Bulky lymphadenopathy in the neck, chest, abdomen, and pelvis demonstrating low level hypermetabolism throughout. 9 mm right thyroid nodule is hypermetabolic. Thyroid ultrasound recommended to further evaluate. Mucosal thickening with air-fluid level in the left maxillary sinus. 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  08/12/2019 PET/CT scan ([id number]) revealed disease progression in neck, chest, abdomen, pelvis and interval development of pulmonary lesions  2. At risk for tumor lysis syndrome -- no evidence of uncontrolled TLS at this time. PLAN: -Discussed pt labwork today, 09/14/19; WBC are slightly low, other blood counts and chemistries are holding steady. Magnesium, Phosphorus, Uric acid are all WNL -The pt has no prohibitive toxicities from continuing C1D8 of Gazyva at this time -Continued increased dosage of Pepcid with Singular for C1D8 to help prevent allergic reactions observed  with C1D1   -Plan to begin Venetoclax with C2 -Advised pt to contact if she begins to experience any infection symptoms  FOLLOW UP: F/u as per scheduled appointments for C1D15 and C2D1  The total time spent in the appt was 20 minutes and more than 50% was on counseling and direct patient cares.  All of the patient's questions were answered with apparent satisfaction. The patient knows to call the clinic with any problems, questions or concerns.    Sullivan Lone MD [place] AAHIVMS [facility] Hematology/Oncology Physician [facility]  (Office):       [phone] (Work cell):  [phone] (Fax):           [phone]  09/14/2019 1:51 PM  I, Yevette Edwards, am acting as a scribe for Dr. Sullivan Lone.   .I have reviewed the above documentation for accuracy and completeness, and I agree with the above. Brunetta Genera MD

## 2019-09-14 NOTE — Progress Notes (Signed)
Per Dr. Irene Limbo:  Faythe Ghee to treat patient with ANC of 0.7

## 2019-09-21 ENCOUNTER — Inpatient Hospital Stay: Payer: Medicare Other

## 2019-09-21 ENCOUNTER — Other Ambulatory Visit: Payer: Self-pay

## 2019-09-21 VITALS — BP 127/72 | HR 70 | Temp 98.8°F | Resp 16 | Wt 150.4 lb

## 2019-09-21 DIAGNOSIS — Z7189 Other specified counseling: Secondary | ICD-10-CM

## 2019-09-21 DIAGNOSIS — C9110 Chronic lymphocytic leukemia of B-cell type not having achieved remission: Secondary | ICD-10-CM

## 2019-09-21 DIAGNOSIS — Z5112 Encounter for antineoplastic immunotherapy: Secondary | ICD-10-CM | POA: Diagnosis not present

## 2019-09-21 LAB — CMP (CANCER CENTER ONLY)
ALT: 7 U/L (ref 0–44)
AST: 11 U/L — ABNORMAL LOW (ref 15–41)
Albumin: 3.7 g/dL (ref 3.5–5.0)
Alkaline Phosphatase: 58 U/L (ref 38–126)
Anion gap: 10 (ref 5–15)
BUN: 27 mg/dL — ABNORMAL HIGH (ref 8–23)
CO2: 21 mmol/L — ABNORMAL LOW (ref 22–32)
Calcium: 9 mg/dL (ref 8.9–10.3)
Chloride: 109 mmol/L (ref 98–111)
Creatinine: 1.25 mg/dL — ABNORMAL HIGH (ref 0.44–1.00)
GFR, Est AFR Am: 50 mL/min — ABNORMAL LOW (ref >60)
GFR, Estimated: 43 mL/min — ABNORMAL LOW (ref >60)
Glucose, Bld: 151 mg/dL — ABNORMAL HIGH (ref 70–99)
Potassium: 4.2 mmol/L (ref 3.5–5.1)
Sodium: 140 mmol/L (ref 135–145)
Total Bilirubin: 0.4 mg/dL (ref 0.3–1.2)
Total Protein: 6.3 g/dL — ABNORMAL LOW (ref 6.5–8.1)

## 2019-09-21 LAB — CBC WITH DIFFERENTIAL/PLATELET
Abs Immature Granulocytes: 0.01 10*3/uL (ref 0.00–0.07)
Basophils Absolute: 0 10*3/uL (ref 0.0–0.1)
Basophils Relative: 0 %
Eosinophils Absolute: 0.1 10*3/uL (ref 0.0–0.5)
Eosinophils Relative: 3 %
HCT: 30.6 % — ABNORMAL LOW (ref 36.0–46.0)
Hemoglobin: 9.5 g/dL — ABNORMAL LOW (ref 12.0–15.0)
Immature Granulocytes: 0 %
Lymphocytes Relative: 79 %
Lymphs Abs: 2.9 10*3/uL (ref 0.7–4.0)
MCH: 27.4 pg (ref 26.0–34.0)
MCHC: 31 g/dL (ref 30.0–36.0)
MCV: 88.2 fL (ref 80.0–100.0)
Monocytes Absolute: 0.1 10*3/uL (ref 0.1–1.0)
Monocytes Relative: 3 %
Neutro Abs: 0.5 10*3/uL — ABNORMAL LOW (ref 1.7–7.7)
Neutrophils Relative %: 15 %
Platelets: 300 10*3/uL (ref 150–400)
RBC: 3.47 MIL/uL — ABNORMAL LOW (ref 3.87–5.11)
RDW: 14.6 % (ref 11.5–15.5)
WBC: 3.6 10*3/uL — ABNORMAL LOW (ref 4.0–10.5)
nRBC: 0 % (ref 0.0–0.2)

## 2019-09-21 LAB — URIC ACID: Uric Acid, Serum: 4.8 mg/dL (ref 2.5–7.1)

## 2019-09-21 LAB — MAGNESIUM: Magnesium: 1.8 mg/dL (ref 1.7–2.4)

## 2019-09-21 LAB — PHOSPHORUS: Phosphorus: 3.5 mg/dL (ref 2.5–4.6)

## 2019-09-21 MED ORDER — DIPHENHYDRAMINE HCL 50 MG/ML IJ SOLN
INTRAMUSCULAR | Status: AC
  Filled 2019-09-21: qty 1

## 2019-09-21 MED ORDER — DIPHENHYDRAMINE HCL 50 MG/ML IJ SOLN
50.0000 mg | Freq: Once | INTRAMUSCULAR | Status: AC
  Administered 2019-09-21: 50 mg via INTRAVENOUS

## 2019-09-21 MED ORDER — SODIUM CHLORIDE 0.9 % IV SOLN
Freq: Once | INTRAVENOUS | Status: AC
  Filled 2019-09-21: qty 250

## 2019-09-21 MED ORDER — MONTELUKAST SODIUM 10 MG PO TABS
ORAL_TABLET | ORAL | Status: AC
  Filled 2019-09-21: qty 1

## 2019-09-21 MED ORDER — ACETAMINOPHEN 325 MG PO TABS
ORAL_TABLET | ORAL | Status: AC
  Filled 2019-09-21: qty 2

## 2019-09-21 MED ORDER — SODIUM CHLORIDE 0.9 % IV SOLN
20.0000 mg | Freq: Once | INTRAVENOUS | Status: AC
  Administered 2019-09-21: 20 mg via INTRAVENOUS
  Filled 2019-09-21: qty 20

## 2019-09-21 MED ORDER — ACETAMINOPHEN 325 MG PO TABS
650.0000 mg | ORAL_TABLET | Freq: Once | ORAL | Status: AC
  Administered 2019-09-21: 650 mg via ORAL

## 2019-09-21 MED ORDER — DIPHENHYDRAMINE HCL 25 MG PO CAPS
ORAL_CAPSULE | ORAL | Status: AC
  Filled 2019-09-21: qty 2

## 2019-09-21 MED ORDER — SODIUM CHLORIDE 0.9 % IV SOLN
1000.0000 mg | Freq: Once | INTRAVENOUS | Status: AC
  Administered 2019-09-21: 11:00:00 1000 mg via INTRAVENOUS
  Filled 2019-09-21: qty 40

## 2019-09-21 MED ORDER — MONTELUKAST SODIUM 10 MG PO TABS
10.0000 mg | ORAL_TABLET | Freq: Once | ORAL | Status: AC
  Administered 2019-09-21: 10 mg via ORAL

## 2019-09-21 MED ORDER — SODIUM CHLORIDE 0.9 % IV SOLN
40.0000 mg | Freq: Once | INTRAVENOUS | Status: AC
  Administered 2019-09-21: 40 mg via INTRAVENOUS
  Filled 2019-09-21: qty 4

## 2019-09-21 NOTE — Patient Instructions (Signed)
[facility] Discharge Instructions for Patients Receiving Chemotherapy  Today you received the following chemotherapy agents: Gazyva   To help prevent nausea and vomiting after your treatment, we encourage you to take your nausea medication as directed.    If you develop nausea and vomiting that is not controlled by your nausea medication, call the clinic.   BELOW ARE SYMPTOMS THAT SHOULD BE REPORTED IMMEDIATELY:  *FEVER GREATER THAN 100.5 F  *CHILLS WITH OR WITHOUT FEVER  NAUSEA AND VOMITING THAT IS NOT CONTROLLED WITH YOUR NAUSEA MEDICATION  *UNUSUAL SHORTNESS OF BREATH  *UNUSUAL BRUISING OR BLEEDING  TENDERNESS IN MOUTH AND THROAT WITH OR WITHOUT PRESENCE OF ULCERS  *URINARY PROBLEMS  *BOWEL PROBLEMS  UNUSUAL RASH Items with * indicate a potential emergency and should be followed up as soon as possible.  Feel free to call the clinic should you have any questions or concerns. The clinic phone number is [phone].  Please show the CHEMO ALERT CARD at check-in to the Emergency Department and triage nurse.   

## 2019-09-23 ENCOUNTER — Ambulatory Visit: Payer: Medicare Other | Attending: Internal Medicine

## 2019-09-23 DIAGNOSIS — Z23 Encounter for immunization: Secondary | ICD-10-CM

## 2019-09-23 NOTE — Progress Notes (Signed)
   Covid-19 Vaccination Clinic  Name:  TAGEN INGOLDSBY    MRN: [id number] DOB: Sep 03, 1945  09/23/2019  Ms. Fitzhenry was observed post Covid-19 immunization for 15 minutes without incident. She was provided with Vaccine Information Sheet and instruction to access the V-Safe system.   Ms. Brinkmeyer was instructed to call 911 with any severe reactions post vaccine: Marland Kitchen Difficulty breathing  . Swelling of face and throat  . A fast heartbeat  . A bad rash all over body  . Dizziness and weakness   Immunizations Administered    Name Date Dose VIS Date Route   Pfizer COVID-19 Vaccine 09/23/2019  2:13 PM 0.3 mL 06/19/2019 Intramuscular   Manufacturer: [place]   Lot: [id number]   [place]: [id number]

## 2019-09-30 NOTE — Progress Notes (Signed)
HEMATOLOGY/ONCOLOGY CLINIC NOTE  Date of Service: 10/05/2019  Patient Care Team: Tower, Wynelle Fanny, MD as PCP - General (Family Medicine) Galvin Proffer, [place] as Consulting Physician (Optometry) Brunetta Genera, MD as Consulting Physician (Hematology)  CHIEF COMPLAINTS/PURPOSE OF CONSULTATION:  Chronic Lymphocytic Leukemia  HISTORY OF PRESENTING ILLNESS:   Erica Carlson is a wonderful 74 y.o. female who has been referred to [place] by my colleague Dr. 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 The pt reports that she is doing well overall.   The pt reports that her SLL was initially picked up tthrough a mammogram in 2017. She notes that her CLL/SLL was stable until December 2019. She notes that she began having feelings of indigestion when she eats. She denies issues with acid reflux. The pt notes that her indigestion began making it difficult to eat, but also endorses a weakened appetite. She lost about 25 pounds, but has gained back 5 pounds. The pt had an unrevealing endoscopy on 07/18/18. She notes that she has some difficulty eating meats. She is staying well hydrated. She has experimented with eating different foods. She endorses having a "lot of gas," and puts a teaspoon of her Mylanta in her coffee every morning.  The pt denies any fevers, chills, or night sweats. She has noticed some lumps in her bilateral neck and armpits. She denies other abdominal pains as such. She denies problems passing urine, nor increased urinary frequency. She denies CP or SOB. She denies leg swelling.   Denies liver, kidney, heart, or lung problems and endorses being generally pretty healthy.  Of note prior to the patient's visit today, pt has had a PET/CT completed on 08/15/18 with results revealing Bulky lymphadenopathy in the neck, chest, abdomen, and pelvis demonstrating low level hypermetabolism throughout. 9 mm right thyroid nodule is hypermetabolic.  Thyroid ultrasound recommended to further evaluate. Mucosal thickening with air-fluid level in the left maxillary sinus. 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  Most recent lab results (09/22/18) of CBC w/diff and BMP is as follows: all values are WNL except for WBC at 16.8k, HGB at 10.9, MCHC at 29.7, ANC at 1.2k, Lymphs abs at 11.3k, Monocytes abs at 4.1k, Creatinine at 1.06.  On review of systems, pt reports frequent indigestion, weakened appetite, weight loss, weaker energy levels, bilateral neck bumps, bilateral armpit bumps, and denies CP, SOB, abdominal pain, leg swelling, fevers, chills, night sweats, lower abdominal pains, changes in bowel habits, and any other symptoms.  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 She is here for C2D1 of Gazyva. The patient's last visit with [place] was on 09/14/2019. The pt reports that she is doing well overall.  The pt reports that she had some mild chills with her last Neulasta injection but denies any issues that are very bothersome. She has noticed some increased muscle cramping, as well as increased fatigue.   Lab results today (10/05/19) of CBC w/diff and CMP is as follows: all values are WNL except for WBC at 3.3K, RBC at 3.64, Hgb at 10.2, HCT at 33.1, Neutro Abs at 0.9K, BUN at 30, Creatinine at 1.24, AST at 14, GFR Est Afr Am at 50. 10/05/2019 Uric acid at 3.6  On review of systems, pt reports chills, fatigue and denies fevers, night sweats, skin rashes, vomiting, dysuria  and any other symptoms.    MEDICAL HISTORY:  Past Medical History:  Diagnosis Date  . Allergy    spring  . Arthritis    osteo arthritis of right knee  . Cataract    forming  . Hyperlipidemia   . Hypertension   . 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 Pneumonia     SURGICAL HISTORY: Past  Surgical History:  Procedure Laterality Date  . ABDOMINAL HYSTERECTOMY    . AXILLARY LYMPH NODE BIOPSY Right 02/07/2016  . back injections    . BREAST SURGERY    . COLONOSCOPY  2009  . EYE SURGERY     retinal tear repair  . KNEE SURGERY     torn ALC per pt    SOCIAL HISTORY: Social History   Socioeconomic History  . Marital status: Widowed    Spouse name: n/a  . Number of children: 6  . Years of education: 89  . Highest education level: Not on file  Occupational History  . 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 Smoking status: Never Smoker  . Smokeless tobacco: Never Used  Substance and Sexual Activity  . Alcohol use: No    Alcohol/week: 0.0 standard drinks  . Drug use: No  . Sexual activity: Never  Other Topics Concern  . Not on file  Social History Narrative   Lives alone.   Her adult children live nearby.   Social Determinants of Health   Financial Resource Strain:   . Difficulty of Paying Living Expenses:   Food Insecurity:   . Worried About Charity fundraiser in the Last Year:   . Arboriculturist in the Last Year:   Transportation Needs:   . Film/video editor (Medical):   Marland Kitchen Lack of Transportation (Non-Medical):   Physical Activity:   . Days of Exercise per Week:   . Minutes of Exercise per Session:   Stress:   . Feeling of Stress :   Social Connections:   . Frequency of Communication with Friends and Family:   . Frequency of Social Gatherings with Friends and Family:   . Attends Religious Services:   . Active Member of Clubs or Organizations:   . Attends Archivist Meetings:   Marland Kitchen Marital Status:   Intimate Partner Violence:   . Fear of Current or Ex-Partner:   . Emotionally Abused:   Marland Kitchen Physically Abused:   . Sexually Abused:     FAMILY HISTORY: Family History  Problem Relation Age of Onset  . Breast cancer Sister   . Hyperlipidemia Son   . Schizophrenia  Mother   . Colon cancer Neg Hx   . Colon polyps Neg Hx   . Esophageal cancer Neg Hx   . Rectal cancer Neg Hx   . Stomach cancer Neg Hx     ALLERGIES:  is allergic to hctz [hydrochlorothiazide] and lipitor [atorvastatin].  MEDICATIONS:  Current Outpatient Medications  Medication Sig Dispense Refill  . allopurinol (ZYLOPRIM) 300 MG tablet Take 0.5 tablets (150 mg total) by mouth daily. 15 tablet 1  . amLODipine (NORVASC) 10 MG tablet Take 1 tablet (10 mg total) by mouth daily. 30 tablet 11  . Cholecalciferol (VITAMIN D-3) 5000 units TABS Take 1 tablet by mouth daily.    Marland Kitchen lisinopril (ZESTRIL) 10 MG tablet Take 0.5 tablets (5 mg total) by mouth daily. 15 tablet 11  .  Multiple Vitamins-Minerals (PRESERVISION AREDS 2 PO) Take by mouth.    . mupirocin ointment (BACTROBAN) 2 % Apply 1 application topically 2 (two) times daily. To sore on buttocks 22 g 1  . ondansetron (ZOFRAN) 8 MG tablet Take 1 tablet (8 mg total) by mouth every 8 (eight) hours as needed for nausea or vomiting. 30 tablet 0  . polyethylene glycol (MIRALAX / GLYCOLAX) 17 g packet Take 17 g by mouth daily.    . rosuvastatin (CRESTOR) 10 MG tablet Take 1 tablet (10 mg total) by mouth daily. Please make overdue appt with Dr. Marlou Porch before anymore refills. 2nd attempt 30 tablet 11   No current facility-administered medications for this visit.    REVIEW OF SYSTEMS:   A 10+ POINT REVIEW OF SYSTEMS WAS OBTAINED including neurology, dermatology, psychiatry, cardiac, respiratory, lymph, extremities, GI, GU, Musculoskeletal, constitutional, breasts, reproductive, HEENT.  All pertinent positives are noted in the HPI.  All others are negative.   PHYSICAL EXAMINATION: ECOG FS:1 - Symptomatic but completely ambulatory  Vitals:   10/05/19 0838  BP: (!) 146/74  Pulse: 81  Resp: 18  Temp: 98.2 F (36.8 C)  SpO2: 100%   Wt Readings from Last 3 Encounters:  10/05/19 149 lb 6.4 oz (67.8 kg)  09/21/19 150 lb 6.4 oz (68.2 kg)  09/14/19  150 lb (68 kg)   Body mass index is 28.23 kg/m.    GENERAL:alert, in no acute distress and comfortable SKIN: no acute rashes, no significant lesions EYES: conjunctiva are pink and non-injected, sclera anicteric OROPHARYNX: MMM, no exudates, no oropharyngeal erythema or ulceration NECK: supple, no JVD LYMPH:  barely alpable lymph nodes in the cervical, axillary and inguinal regions, improved from last visit. LUNGS: clear to auscultation b/l with normal respiratory effort HEART: regular rate & rhythm ABDOMEN:  normoactive bowel sounds , non tender, not distended. No palpable hepatosplenomegaly.  Extremity: no pedal edema PSYCH: alert & oriented x 3 with fluent speech NEURO: no focal motor/sensory deficits  LABORATORY DATA:  I have reviewed the data as listed  . CBC Latest Ref Rng & Units 10/05/2019 09/21/2019 09/14/2019  WBC 4.0 - 10.5 K/uL 3.3(L) 3.6(L) 3.1(L)  Hemoglobin 12.0 - 15.0 g/dL 10.2(L) 9.5(L) 10.4(L)  Hematocrit 36.0 - 46.0 % 33.1(L) 30.6(L) 33.0(L)  Platelets 150 - 400 K/uL 200 300 192    . CMP Latest Ref Rng & Units 10/05/2019 09/21/2019 09/14/2019  Glucose 70 - 99 mg/dL 86 151(H) 178(H)  BUN 8 - 23 mg/dL 30(H) 27(H) 24(H)  Creatinine 0.44 - 1.00 mg/dL 1.24(H) 1.25(H) 1.22(H)  Sodium 135 - 145 mmol/L 143 140 140  Potassium 3.5 - 5.1 mmol/L 4.7 4.2 4.3  Chloride 98 - 111 mmol/L 110 109 108  CO2 22 - 32 mmol/L 24 21(L) 23  Calcium 8.9 - 10.3 mg/dL 9.6 9.0 9.3  Total Protein 6.5 - 8.1 g/dL 6.8 6.3(L) 6.6  Total Bilirubin 0.3 - 1.2 mg/dL 0.5 0.4 0.3  Alkaline Phos 38 - 126 U/L 60 58 51  AST 15 - 41 U/L 14(L) 11(L) 15  ALT 0 - 44 U/L 9 7 9     09/22/18 Right Axillary LN Biopsy:    07/31/18 Molecular Pathology:     02/21/16 Biopsy:    RADIOGRAPHIC STUDIES: I have personally reviewed the radiological images as listed and agreed with the findings in the report. No results found.  ASSESSMENT & PLAN:   74 y.o. female with  1. Chronic Lymphocytic  Leukemia  02/21/16 Right Axillary LN biopsy revealed SLL,  the initial diagnosis. 07/31/18 FISH CLL Prognostic Panel revealed:52.0% cells with 11q deletion Labs upon initial presentation from 09/22/18, WBC at 16.8k, HGB at 10.9, PLT at 272k. ANC at 1.2k, Lymphs at 11.3k, Monocytes at 4.1k. 09/22/18 Right Axillary LN Biopsy revealed SLL, ruling out a transformation event  08/15/18 PET/CT revealed Bulky lymphadenopathy in the neck, chest, abdomen, and pelvis demonstrating low level hypermetabolism throughout. 9 mm right thyroid nodule is hypermetabolic. Thyroid ultrasound recommended to further evaluate. Mucosal thickening with air-fluid level in the left maxillary sinus. 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  08/12/2019 PET/CT scan ([id number]) revealed disease progression in neck, chest, abdomen, pelvis and interval development of pulmonary lesions  2. At risk for tumor lysis syndrome -- no evidence of uncontrolled TLS at this time.  PLAN: -Discussed pt labwork today, 10/05/19; blood counts stable, neutropenia has improved, blood chemistries look good, Uric acid is WNL -The pt has no prohibitive toxicities from continuing C2D1 of Gazyva at this time -Continued increased dosage of Pepcid with Singular for C2D1 to help prevent allergic reactions observed with C1D1.  -Plan to begin Venetoclax with Cycle 3 -Advised pt of the weekly labs needed for monitoring after beginning Venetoclax -Advised pt to begin taking 150 mg Allopurinol every other day - pt's risk of TLS is lowered at this time -Recommend pt continue to drink at least 2 liters of water per day, continue to eat well, and walk 20-30 minutes each day.  -Will see back in 4 weeks with labs and next cycle of Gazyva   FOLLOW UP: Please scheduled next cycle of Gazyva, labs and MD visit in 4 weeks    The total time spent in the appt was 20 minutes and more than 50% was on counseling and direct patient cares.  All of the  patient's questions were answered with apparent satisfaction. The patient knows to call the clinic with any problems, questions or concerns.    Sullivan Lone MD [place] AAHIVMS [facility] Hematology/Oncology Physician [facility]  (Office):       [phone] (Work cell):  [phone] (Fax):           [phone]  10/05/2019 9:22 AM  I, Yevette Edwards, am acting as a scribe for Dr. Sullivan Lone.   .I have reviewed the above documentation for accuracy and completeness, and I agree with the above. Brunetta Genera MD

## 2019-10-05 ENCOUNTER — Inpatient Hospital Stay: Payer: Medicare Other

## 2019-10-05 ENCOUNTER — Inpatient Hospital Stay (HOSPITAL_BASED_OUTPATIENT_CLINIC_OR_DEPARTMENT_OTHER): Payer: Medicare Other | Admitting: Hematology

## 2019-10-05 ENCOUNTER — Other Ambulatory Visit: Payer: Self-pay

## 2019-10-05 VITALS — BP 146/74 | HR 81 | Temp 98.2°F | Resp 18 | Ht 61.0 in | Wt 149.4 lb

## 2019-10-05 VITALS — BP 144/64 | HR 72 | Temp 97.8°F | Resp 16

## 2019-10-05 DIAGNOSIS — Z5112 Encounter for antineoplastic immunotherapy: Secondary | ICD-10-CM

## 2019-10-05 DIAGNOSIS — C9110 Chronic lymphocytic leukemia of B-cell type not having achieved remission: Secondary | ICD-10-CM | POA: Diagnosis not present

## 2019-10-05 DIAGNOSIS — Z7189 Other specified counseling: Secondary | ICD-10-CM

## 2019-10-05 LAB — CMP (CANCER CENTER ONLY)
ALT: 9 U/L (ref 0–44)
AST: 14 U/L — ABNORMAL LOW (ref 15–41)
Albumin: 3.9 g/dL (ref 3.5–5.0)
Alkaline Phosphatase: 60 U/L (ref 38–126)
Anion gap: 9 (ref 5–15)
BUN: 30 mg/dL — ABNORMAL HIGH (ref 8–23)
CO2: 24 mmol/L (ref 22–32)
Calcium: 9.6 mg/dL (ref 8.9–10.3)
Chloride: 110 mmol/L (ref 98–111)
Creatinine: 1.24 mg/dL — ABNORMAL HIGH (ref 0.44–1.00)
GFR, Est AFR Am: 50 mL/min — ABNORMAL LOW (ref >60)
GFR, Estimated: 43 mL/min — ABNORMAL LOW (ref >60)
Glucose, Bld: 86 mg/dL (ref 70–99)
Potassium: 4.7 mmol/L (ref 3.5–5.1)
Sodium: 143 mmol/L (ref 135–145)
Total Bilirubin: 0.5 mg/dL (ref 0.3–1.2)
Total Protein: 6.8 g/dL (ref 6.5–8.1)

## 2019-10-05 LAB — CBC WITH DIFFERENTIAL/PLATELET
Abs Immature Granulocytes: 0.05 10*3/uL (ref 0.00–0.07)
Basophils Absolute: 0 10*3/uL (ref 0.0–0.1)
Basophils Relative: 1 %
Eosinophils Absolute: 0.1 10*3/uL (ref 0.0–0.5)
Eosinophils Relative: 4 %
HCT: 33.1 % — ABNORMAL LOW (ref 36.0–46.0)
Hemoglobin: 10.2 g/dL — ABNORMAL LOW (ref 12.0–15.0)
Immature Granulocytes: 2 %
Lymphocytes Relative: 64 %
Lymphs Abs: 2.2 10*3/uL (ref 0.7–4.0)
MCH: 28 pg (ref 26.0–34.0)
MCHC: 30.8 g/dL (ref 30.0–36.0)
MCV: 90.9 fL (ref 80.0–100.0)
Monocytes Absolute: 0.1 10*3/uL (ref 0.1–1.0)
Monocytes Relative: 3 %
Neutro Abs: 0.9 10*3/uL — ABNORMAL LOW (ref 1.7–7.7)
Neutrophils Relative %: 26 %
Platelets: 200 10*3/uL (ref 150–400)
RBC: 3.64 MIL/uL — ABNORMAL LOW (ref 3.87–5.11)
RDW: 14.2 % (ref 11.5–15.5)
WBC: 3.3 10*3/uL — ABNORMAL LOW (ref 4.0–10.5)
nRBC: 0 % (ref 0.0–0.2)

## 2019-10-05 LAB — URIC ACID: Uric Acid, Serum: 5.6 mg/dL (ref 2.5–7.1)

## 2019-10-05 MED ORDER — FAMOTIDINE IN NACL 20-0.9 MG/50ML-% IV SOLN
INTRAVENOUS | Status: AC
  Filled 2019-10-05: qty 50

## 2019-10-05 MED ORDER — DIPHENHYDRAMINE HCL 50 MG/ML IJ SOLN
50.0000 mg | Freq: Once | INTRAMUSCULAR | Status: AC
  Administered 2019-10-05: 50 mg via INTRAVENOUS

## 2019-10-05 MED ORDER — MONTELUKAST SODIUM 10 MG PO TABS
10.0000 mg | ORAL_TABLET | Freq: Once | ORAL | Status: AC
  Administered 2019-10-05: 10 mg via ORAL

## 2019-10-05 MED ORDER — MONTELUKAST SODIUM 10 MG PO TABS
ORAL_TABLET | ORAL | Status: AC
  Filled 2019-10-05: qty 1

## 2019-10-05 MED ORDER — ACETAMINOPHEN 325 MG PO TABS
650.0000 mg | ORAL_TABLET | Freq: Once | ORAL | Status: AC
  Administered 2019-10-05: 10:00:00 650 mg via ORAL

## 2019-10-05 MED ORDER — DIPHENHYDRAMINE HCL 50 MG/ML IJ SOLN
INTRAMUSCULAR | Status: AC
  Filled 2019-10-05: qty 1

## 2019-10-05 MED ORDER — SODIUM CHLORIDE 0.9 % IV SOLN
Freq: Once | INTRAVENOUS | Status: AC
  Filled 2019-10-05: qty 250

## 2019-10-05 MED ORDER — ACETAMINOPHEN 325 MG PO TABS
ORAL_TABLET | ORAL | Status: AC
  Filled 2019-10-05: qty 2

## 2019-10-05 MED ORDER — SODIUM CHLORIDE 0.9 % IV SOLN
40.0000 mg | Freq: Once | INTRAVENOUS | Status: AC
  Administered 2019-10-05: 40 mg via INTRAVENOUS
  Filled 2019-10-05: qty 4

## 2019-10-05 MED ORDER — SODIUM CHLORIDE 0.9 % IV SOLN
20.0000 mg | Freq: Once | INTRAVENOUS | Status: AC
  Administered 2019-10-05: 10:00:00 20 mg via INTRAVENOUS
  Filled 2019-10-05: qty 20

## 2019-10-05 MED ORDER — SODIUM CHLORIDE 0.9 % IV SOLN
1000.0000 mg | Freq: Once | INTRAVENOUS | Status: AC
  Administered 2019-10-05: 1000 mg via INTRAVENOUS
  Filled 2019-10-05: qty 40

## 2019-10-05 NOTE — Patient Instructions (Signed)
Cancer Center Discharge Instructions for Patients Receiving Chemotherapy  Today you received the following chemotherapy agents: Gazyva   To help prevent nausea and vomiting after your treatment, we encourage you to take your nausea medication as directed.    If you develop nausea and vomiting that is not controlled by your nausea medication, call the clinic.   BELOW ARE SYMPTOMS THAT SHOULD BE REPORTED IMMEDIATELY:  *FEVER GREATER THAN 100.5 F  *CHILLS WITH OR WITHOUT FEVER  NAUSEA AND VOMITING THAT IS NOT CONTROLLED WITH YOUR NAUSEA MEDICATION  *UNUSUAL SHORTNESS OF BREATH  *UNUSUAL BRUISING OR BLEEDING  TENDERNESS IN MOUTH AND THROAT WITH OR WITHOUT PRESENCE OF ULCERS  *URINARY PROBLEMS  *BOWEL PROBLEMS  UNUSUAL RASH Items with * indicate a potential emergency and should be followed up as soon as possible.  Feel free to call the clinic should you have any questions or concerns. The clinic phone number is [phone].  Please show the CHEMO ALERT CARD at check-in to the Emergency Department and triage nurse.   

## 2019-10-05 NOTE — Progress Notes (Signed)
Okay to treat today with Neut. 0.9, per Dr. Irene Limbo.

## 2019-10-06 ENCOUNTER — Telehealth: Payer: Self-pay | Admitting: Hematology

## 2019-10-06 NOTE — Telephone Encounter (Signed)
Scheduling per 03/29 los, patient has been called and voicemail is full.

## 2019-10-07 ENCOUNTER — Telehealth: Payer: Self-pay | Admitting: Hematology

## 2019-10-07 MED ORDER — VENETOCLAX 10 & 50 & 100 MG PO TBPK: ORAL_TABLET | ORAL | 0 refills | Status: DC

## 2019-10-07 NOTE — Telephone Encounter (Signed)
Scheduled per 03/29 los, patient has been called and notified.

## 2019-10-30 NOTE — Progress Notes (Signed)

## 2019-11-04 ENCOUNTER — Other Ambulatory Visit: Payer: Self-pay | Admitting: *Deleted

## 2019-11-04 DIAGNOSIS — C9110 Chronic lymphocytic leukemia of B-cell type not having achieved remission: Secondary | ICD-10-CM

## 2019-11-04 NOTE — Progress Notes (Signed)
HEMATOLOGY/ONCOLOGY CLINIC NOTE  Date of Service: 11/05/2019  Patient Care Team: Tower, Wynelle Fanny, MD as PCP - General (Family Medicine) Galvin Proffer, [place] as Consulting Physician (Optometry) Brunetta Genera, MD as Consulting Physician (Hematology)  CHIEF COMPLAINTS/PURPOSE OF CONSULTATION:  Chronic Lymphocytic Leukemia  HISTORY OF PRESENTING ILLNESS:   Erica Carlson is a wonderful 74 y.o. female who has been referred to [place] by my colleague Dr. 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 The pt reports that she is doing well overall.   The pt reports that her SLL was initially picked up tthrough a mammogram in 2017. She notes that her CLL/SLL was stable until December 2019. She notes that she began having feelings of indigestion when she eats. She denies issues with acid reflux. The pt notes that her indigestion began making it difficult to eat, but also endorses a weakened appetite. She lost about 25 pounds, but has gained back 5 pounds. The pt had an unrevealing endoscopy on 07/18/18. She notes that she has some difficulty eating meats. She is staying well hydrated. She has experimented with eating different foods. She endorses having a "lot of gas," and puts a teaspoon of her Mylanta in her coffee every morning.  The pt denies any fevers, chills, or night sweats. She has noticed some lumps in her bilateral neck and armpits. She denies other abdominal pains as such. She denies problems passing urine, nor increased urinary frequency. She denies CP or SOB. She denies leg swelling.   Denies liver, kidney, heart, or lung problems and endorses being generally pretty healthy.  Of note prior to the patient's visit today, pt has had a PET/CT completed on 08/15/18 with results revealing Bulky lymphadenopathy in the neck, chest, abdomen, and pelvis demonstrating low level hypermetabolism throughout. 9 mm right thyroid nodule is hypermetabolic.  Thyroid ultrasound recommended to further evaluate. Mucosal thickening with air-fluid level in the left maxillary sinus. 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  Most recent lab results (09/22/18) of CBC w/diff and BMP is as follows: all values are WNL except for WBC at 16.8k, HGB at 10.9, MCHC at 29.7, ANC at 1.2k, Lymphs abs at 11.3k, Monocytes abs at 4.1k, Creatinine at 1.06.  On review of systems, pt reports frequent indigestion, weakened appetite, weight loss, weaker energy levels, bilateral neck bumps, bilateral armpit bumps, and denies CP, SOB, abdominal pain, leg swelling, fevers, chills, night sweats, lower abdominal pains, changes in bowel habits, and any other symptoms.  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 She is here for C3D1 of Gazyva. The patient's last visit with [place] was on 10/05/19. The pt reports that she is doing well overall.  The pt reports she is good. She has been taking cramps in his fingers. Pt has been having pain in right leg that radiates from the knee. She has been noticing lymph nodes shrinking. Pt has gotten both doses of COVID19 vaccine. She has been working from home. Pt reports she had gout a while ago but it went away.   Lab results today (11/05/19) of CBC w/diff and CMP is as follows: all values are WNL except for WBC at 3.2K, RBC at 3.56, Hemoglobin at 9.9, HCT at 31.9, Neutro Abs at 1.1K, AST at 12, GFR, Est Non Af Am at 59  11/05/19  of Uric Acid at 4.9  On review of systems, pt reports cramps in fingers, right knee pain and denies fever, chills, mouth soars, night sweats, nausea and any other symptoms.   MEDICAL HISTORY:  Past Medical History:  Diagnosis Date  . Allergy    spring  . Arthritis    osteo arthritis of right knee  . Cataract    forming  . Hyperlipidemia   . Hypertension    . 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 Pneumonia     SURGICAL HISTORY: Past Surgical History:  Procedure Laterality Date  . ABDOMINAL HYSTERECTOMY    . AXILLARY LYMPH NODE BIOPSY Right 02/07/2016  . back injections    . BREAST SURGERY    . COLONOSCOPY  2009  . EYE SURGERY     retinal tear repair  . KNEE SURGERY     torn ALC per pt    SOCIAL HISTORY: Social History   Socioeconomic History  . Marital status: Widowed    Spouse name: n/a  . Number of children: 6  . Years of education: 46  . Highest education level: Not on file  Occupational History  . 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 Smoking status: Never Smoker  . Smokeless tobacco: Never Used  Substance and Sexual Activity  . Alcohol use: No    Alcohol/week: 0.0 standard drinks  . Drug use: No  . Sexual activity: Never  Other Topics Concern  . Not on file  Social History Narrative   Lives alone.   Her adult children live nearby.   Social Determinants of Health   Financial Resource Strain:   . Difficulty of Paying Living Expenses:   Food Insecurity:   . Worried About Charity fundraiser in the Last Year:   . Arboriculturist in the Last Year:   Transportation Needs:   . Film/video editor (Medical):   Marland Kitchen Lack of Transportation (Non-Medical):   Physical Activity:   . Days of Exercise per Week:   . Minutes of Exercise per Session:   Stress:   . Feeling of Stress :   Social Connections:   . Frequency of Communication with Friends and Family:   . Frequency of Social Gatherings with Friends and Family:   . Attends Religious Services:   . Active Member of Clubs or Organizations:   . Attends Archivist Meetings:   Marland Kitchen Marital Status:   Intimate Partner Violence:   . Fear of Current or Ex-Partner:   . Emotionally Abused:   Marland Kitchen Physically Abused:   . Sexually Abused:     FAMILY HISTORY: Family History   Problem Relation Age of Onset  . Breast cancer Sister   . Hyperlipidemia Son   . Schizophrenia Mother   . Colon cancer Neg Hx   . Colon polyps Neg Hx   . Esophageal cancer Neg Hx   . Rectal cancer Neg Hx   . Stomach cancer Neg Hx     ALLERGIES:  is allergic to hctz [hydrochlorothiazide] and lipitor [atorvastatin].  MEDICATIONS:  Current Outpatient Medications  Medication Sig Dispense Refill  . allopurinol (ZYLOPRIM) 300 MG tablet Take 0.5 tablets (150 mg total) by mouth daily. 15 tablet 1  . amLODipine (NORVASC) 10 MG tablet Take 1 tablet (10 mg total) by mouth daily. 30 tablet 11  . Cholecalciferol (VITAMIN D-3) 5000 units TABS Take 1  tablet by mouth daily.    Marland Kitchen lisinopril (ZESTRIL) 10 MG tablet Take 0.5 tablets (5 mg total) by mouth daily. 15 tablet 11  . Multiple Vitamins-Minerals (PRESERVISION AREDS 2 PO) Take by mouth.    . mupirocin ointment (BACTROBAN) 2 % Apply 1 application topically 2 (two) times daily. To sore on buttocks 22 g 1  . ondansetron (ZOFRAN) 8 MG tablet Take 1 tablet (8 mg total) by mouth every 8 (eight) hours as needed for nausea or vomiting. 30 tablet 0  . polyethylene glycol (MIRALAX / GLYCOLAX) 17 g packet Take 17 g by mouth daily.    . rosuvastatin (CRESTOR) 10 MG tablet Take 1 tablet (10 mg total) by mouth daily. Please make overdue appt with Dr. Marlou Porch before anymore refills. 2nd attempt 30 tablet 11   No current facility-administered medications for this visit.    REVIEW OF SYSTEMS:   A 10+ POINT REVIEW OF SYSTEMS WAS OBTAINED including neurology, dermatology, psychiatry, cardiac, respiratory, lymph, extremities, GI, GU, Musculoskeletal, constitutional, breasts, reproductive, HEENT.  All pertinent positives are noted in the HPI.  All others are negative.   PHYSICAL EXAMINATION: ECOG FS:1 - Symptomatic but completely ambulatory  Vitals:   11/05/19 0912  BP: 140/74  Pulse: 77  Resp: 18  Temp: 98.7 F (37.1 C)  SpO2: 100%   Wt Readings from Last  3 Encounters:  11/05/19 152 lb 4.8 oz (69.1 kg)  10/05/19 149 lb 6.4 oz (67.8 kg)  09/21/19 150 lb 6.4 oz (68.2 kg)   Body mass index is 21.24 kg/m.    GENERAL:alert, in no acute distress and comfortable SKIN: no acute rashes, no significant lesions EYES: conjunctiva are pink and non-injected, sclera anicteric OROPHARYNX: MMM, no exudates, no oropharyngeal erythema or ulceration NECK: supple, no JVD LYMPH:  palpable lymphadenopathy in the cervical, axillary or inguinal regions- lymph nodes are much smaller  LUNGS: clear to auscultation b/l with normal respiratory effort HEART: regular rate & rhythm ABDOMEN:  normoactive bowel sounds , non tender, not distended. Extremity: no pedal edema PSYCH: alert & oriented x 3 with fluent speech NEURO: no focal motor/sensory deficits  LABORATORY DATA:  I have reviewed the data as listed  . CBC Latest Ref Rng & Units 11/05/2019 10/05/2019 09/21/2019  WBC 4.0 - 10.5 K/uL 3.2(L) 3.3(L) 3.6(L)  Hemoglobin 12.0 - 15.0 g/dL 9.9(L) 10.2(L) 9.5(L)  Hematocrit 36.0 - 46.0 % 31.9(L) 33.1(L) 30.6(L)  Platelets 150 - 400 K/uL 306 200 300    . CMP Latest Ref Rng & Units 11/05/2019 10/05/2019 09/21/2019  Glucose 70 - 99 mg/dL 87 86 151(H)  BUN 8 - 23 mg/dL 21 30(H) 27(H)  Creatinine 0.44 - 1.00 mg/dL 0.95 1.24(H) 1.25(H)  Sodium 135 - 145 mmol/L 142 143 140  Potassium 3.5 - 5.1 mmol/L 4.1 4.7 4.2  Chloride 98 - 111 mmol/L 108 110 109  CO2 22 - 32 mmol/L 25 24 21(L)  Calcium 8.9 - 10.3 mg/dL 9.4 9.6 9.0  Total Protein 6.5 - 8.1 g/dL 6.7 6.8 6.3(L)  Total Bilirubin 0.3 - 1.2 mg/dL 0.4 0.5 0.4  Alkaline Phos 38 - 126 U/L 62 60 58  AST 15 - 41 U/L 12(L) 14(L) 11(L)  ALT 0 - 44 U/L 8 9 7     09/22/18 Right Axillary LN Biopsy:    07/31/18 Molecular Pathology:     02/21/16 Biopsy:    RADIOGRAPHIC STUDIES: I have personally reviewed the radiological images as listed and agreed with the findings in the report. No results found.  ASSESSMENT &  PLAN:   74 y.o. female with  1. Chronic Lymphocytic Leukemia  02/21/16 Right Axillary LN biopsy revealed SLL, the initial diagnosis. 07/31/18 FISH CLL Prognostic Panel revealed:52.0% cells with 11q deletion Labs upon initial presentation from 09/22/18, WBC at 16.8k, HGB at 10.9, PLT at 272k. ANC at 1.2k, Lymphs at 11.3k, Monocytes at 4.1k. 09/22/18 Right Axillary LN Biopsy revealed SLL, ruling out a transformation event  08/15/18 PET/CT revealed Bulky lymphadenopathy in the neck, chest, abdomen, and pelvis demonstrating low level hypermetabolism throughout. 9 mm right thyroid nodule is hypermetabolic. Thyroid ultrasound recommended to further evaluate. Mucosal thickening with air-fluid level in the left maxillary sinus. 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  08/12/2019 PET/CT scan ([id number]) revealed disease progression in neck, chest, abdomen, pelvis and interval development of pulmonary lesions  2. At risk for tumor lysis syndrome -- no evidence of uncontrolled TLS at this time.  PLAN: -Discussed pt labwork today, 11/05/19; of CBC w/diff and CMP is as follows: all values are WNL except for WBC at 3.2K, RBC at 3.56, Hemoglobin at 9.9, HCT at 31.9, Neutro Abs at 1.1K, AST at 12, GFR, Est Non Af Am at 59  -Discussed 11/05/19 of Uric Acid at 4.9 -Advised on knee pain -Advised on OTC support sock for knee  -The pt has no prohibitive toxicities from continuing C2D1 of Gazyva at this time -Recommended that the pt continue to eat well, drink at least 2 Liters of water each day, and walk 20-30 minutes each day.  -Advised pt of the weekly labs needed for monitoring after beginning Venetoclax -Recommends starting to take Venetoclax  -Pt is here for C3 of Gazyva -Will see back in 2 week  FOLLOW UP: Weekly labs x 4 MD visit in 2 weeks Please schedule next 3 cycles of Gazyva with labs and MD visits  The total time spent in the appt was 20 minutes and more than 50% was on  counseling and direct patient cares.  All of the patient's questions were answered with apparent satisfaction. The patient knows to call the clinic with any problems, questions or concerns.  Sullivan Lone MD [place] AAHIVMS [facility] Hematology/Oncology Physician [facility]  (Office):       [phone] (Work cell):  [phone] (Fax):           [phone]  11/05/2019 9:53 AM  I, Dawayne Cirri am acting as a scribe for Dr. Sullivan Lone.   .I have reviewed the above documentation for accuracy and completeness, and I agree with the above. Brunetta Genera MD

## 2019-11-05 ENCOUNTER — Inpatient Hospital Stay (HOSPITAL_BASED_OUTPATIENT_CLINIC_OR_DEPARTMENT_OTHER): Payer: Medicare Other | Admitting: Hematology

## 2019-11-05 ENCOUNTER — Inpatient Hospital Stay: Payer: Medicare Other | Attending: Hematology

## 2019-11-05 ENCOUNTER — Telehealth: Payer: Self-pay | Admitting: Pharmacist

## 2019-11-05 ENCOUNTER — Other Ambulatory Visit: Payer: Self-pay

## 2019-11-05 ENCOUNTER — Inpatient Hospital Stay: Payer: Medicare Other

## 2019-11-05 VITALS — BP 140/74 | HR 77 | Temp 98.7°F | Resp 18 | Ht 71.0 in | Wt 152.3 lb

## 2019-11-05 VITALS — BP 120/72 | HR 66 | Temp 98.0°F | Resp 16

## 2019-11-05 DIAGNOSIS — Z7189 Other specified counseling: Secondary | ICD-10-CM

## 2019-11-05 DIAGNOSIS — C9110 Chronic lymphocytic leukemia of B-cell type not having achieved remission: Secondary | ICD-10-CM

## 2019-11-05 DIAGNOSIS — Z5112 Encounter for antineoplastic immunotherapy: Secondary | ICD-10-CM | POA: Insufficient documentation

## 2019-11-05 LAB — CMP (CANCER CENTER ONLY)
ALT: 8 U/L (ref 0–44)
AST: 12 U/L — ABNORMAL LOW (ref 15–41)
Albumin: 3.9 g/dL (ref 3.5–5.0)
Alkaline Phosphatase: 62 U/L (ref 38–126)
Anion gap: 9 (ref 5–15)
BUN: 21 mg/dL (ref 8–23)
CO2: 25 mmol/L (ref 22–32)
Calcium: 9.4 mg/dL (ref 8.9–10.3)
Chloride: 108 mmol/L (ref 98–111)
Creatinine: 0.95 mg/dL (ref 0.44–1.00)
GFR, Est AFR Am: >60 mL/min (ref >60)
GFR, Estimated: 59 mL/min — ABNORMAL LOW (ref >60)
Glucose, Bld: 87 mg/dL (ref 70–99)
Potassium: 4.1 mmol/L (ref 3.5–5.1)
Sodium: 142 mmol/L (ref 135–145)
Total Bilirubin: 0.4 mg/dL (ref 0.3–1.2)
Total Protein: 6.7 g/dL (ref 6.5–8.1)

## 2019-11-05 LAB — CBC WITH DIFFERENTIAL (CANCER CENTER ONLY)
Abs Immature Granulocytes: 0.06 10*3/uL (ref 0.00–0.07)
Basophils Absolute: 0 10*3/uL (ref 0.0–0.1)
Basophils Relative: 1 %
Eosinophils Absolute: 0.2 10*3/uL (ref 0.0–0.5)
Eosinophils Relative: 5 %
HCT: 31.9 % — ABNORMAL LOW (ref 36.0–46.0)
Hemoglobin: 9.9 g/dL — ABNORMAL LOW (ref 12.0–15.0)
Immature Granulocytes: 2 %
Lymphocytes Relative: 56 %
Lymphs Abs: 1.8 10*3/uL (ref 0.7–4.0)
MCH: 27.8 pg (ref 26.0–34.0)
MCHC: 31 g/dL (ref 30.0–36.0)
MCV: 89.6 fL (ref 80.0–100.0)
Monocytes Absolute: 0.1 10*3/uL (ref 0.1–1.0)
Monocytes Relative: 3 %
Neutro Abs: 1.1 10*3/uL — ABNORMAL LOW (ref 1.7–7.7)
Neutrophils Relative %: 33 %
Platelet Count: 306 10*3/uL (ref 150–400)
RBC: 3.56 MIL/uL — ABNORMAL LOW (ref 3.87–5.11)
RDW: 13.9 % (ref 11.5–15.5)
WBC Count: 3.2 10*3/uL — ABNORMAL LOW (ref 4.0–10.5)
nRBC: 0 % (ref 0.0–0.2)

## 2019-11-05 LAB — URIC ACID: Uric Acid, Serum: 4.9 mg/dL (ref 2.5–7.1)

## 2019-11-05 MED ORDER — ACETAMINOPHEN 325 MG PO TABS
ORAL_TABLET | ORAL | Status: AC
  Filled 2019-11-05: qty 2

## 2019-11-05 MED ORDER — SODIUM CHLORIDE 0.9 % IV SOLN
40.0000 mg | Freq: Once | INTRAVENOUS | Status: AC
  Administered 2019-11-05: 40 mg via INTRAVENOUS
  Filled 2019-11-05: qty 4

## 2019-11-05 MED ORDER — SODIUM CHLORIDE 0.9 % IV SOLN
1000.0000 mg | Freq: Once | INTRAVENOUS | Status: AC
  Administered 2019-11-05: 1000 mg via INTRAVENOUS
  Filled 2019-11-05: qty 40

## 2019-11-05 MED ORDER — DIPHENHYDRAMINE HCL 50 MG/ML IJ SOLN
50.0000 mg | Freq: Once | INTRAMUSCULAR | Status: AC
  Administered 2019-11-05: 50 mg via INTRAVENOUS

## 2019-11-05 MED ORDER — ACETAMINOPHEN 325 MG PO TABS
650.0000 mg | ORAL_TABLET | Freq: Once | ORAL | Status: AC
  Administered 2019-11-05: 650 mg via ORAL

## 2019-11-05 MED ORDER — SODIUM CHLORIDE 0.9 % IV SOLN
Freq: Once | INTRAVENOUS | Status: AC
  Filled 2019-11-05: qty 250

## 2019-11-05 MED ORDER — DIPHENHYDRAMINE HCL 50 MG/ML IJ SOLN
INTRAMUSCULAR | Status: AC
  Filled 2019-11-05: qty 1

## 2019-11-05 MED ORDER — VENETOCLAX 10 & 50 & 100 MG PO TBPK: ORAL_TABLET | ORAL | 0 refills | Status: DC

## 2019-11-05 MED ORDER — MONTELUKAST SODIUM 10 MG PO TABS
ORAL_TABLET | ORAL | Status: AC
  Filled 2019-11-05: qty 1

## 2019-11-05 MED ORDER — SODIUM CHLORIDE 0.9 % IV SOLN
20.0000 mg | Freq: Once | INTRAVENOUS | Status: AC
  Administered 2019-11-05: 20 mg via INTRAVENOUS
  Filled 2019-11-05: qty 20

## 2019-11-05 MED ORDER — MONTELUKAST SODIUM 10 MG PO TABS
10.0000 mg | ORAL_TABLET | Freq: Once | ORAL | Status: AC
  Administered 2019-11-05: 10 mg via ORAL

## 2019-11-05 NOTE — Telephone Encounter (Signed)
Oral Oncology Pharmacist Encounter  Received new prescription for Venclexta (venetoclax) for the treatment of CLL in conjunction with obinutuzumab, planned duration until disease progression or unacceptable drug toxicity.  CMP from 11/05/19 assessed, no relevant lab abnormalities. Prescription dose and frequency assessed.   Current medication list in Epic reviewed, no DDIs with venetoclax identified.  Prescription has been e-scribed to the [facility] for benefits analysis and approval.  Oral Oncology Clinic will continue to follow for insurance authorization, copayment issues, initial counseling and start date.  Darl Pikes, PharmD, BCPS, BCOP, CPP Hematology/Oncology Clinical Pharmacist [facility] [place] [facility] [phone]  11/05/2019 4:32 PM

## 2019-11-05 NOTE — Patient Instructions (Signed)
[facility] Discharge Instructions for Patients Receiving Chemotherapy  Today you received the following chemotherapy agents: Gazyva   To help prevent nausea and vomiting after your treatment, we encourage you to take your nausea medication as directed.    If you develop nausea and vomiting that is not controlled by your nausea medication, call the clinic.   BELOW ARE SYMPTOMS THAT SHOULD BE REPORTED IMMEDIATELY:  *FEVER GREATER THAN 100.5 F  *CHILLS WITH OR WITHOUT FEVER  NAUSEA AND VOMITING THAT IS NOT CONTROLLED WITH YOUR NAUSEA MEDICATION  *UNUSUAL SHORTNESS OF BREATH  *UNUSUAL BRUISING OR BLEEDING  TENDERNESS IN MOUTH AND THROAT WITH OR WITHOUT PRESENCE OF ULCERS  *URINARY PROBLEMS  *BOWEL PROBLEMS  UNUSUAL RASH Items with * indicate a potential emergency and should be followed up as soon as possible.  Feel free to call the clinic should you have any questions or concerns. The clinic phone number is [phone].  Please show the CHEMO ALERT CARD at check-in to the Emergency Department and triage nurse.   

## 2019-11-06 ENCOUNTER — Telehealth: Payer: Self-pay | Admitting: Pharmacy Technician

## 2019-11-06 ENCOUNTER — Telehealth: Payer: Self-pay

## 2019-11-06 NOTE — Telephone Encounter (Signed)
Oral Chemotherapy Pharmacist Encounter   Attempted to call patient twice today to obtain information needed to enroll her in copay foundation assistance. Unable to reach patient LVM for her to return my call.  Darl Pikes, PharmD, BCPS, BCOP, CPP Hematology/Oncology Clinical Pharmacist [facility] [place] [facility] [phone]  11/06/2019 4:56 PM

## 2019-11-06 NOTE — Telephone Encounter (Signed)
Oral Oncology Patient Advocate Encounter  Prior Authorization for Erica Carlson has been approved.    PA# [id number] Effective dates: 11/06/2019 through 07/08/2020  Patients co-pay is $982.23.  Oral Oncology Clinic will continue to follow.   [place] Patient [place] Phone [phone] Fax [phone] 11/06/2019 9:48 AM

## 2019-11-06 NOTE — Telephone Encounter (Signed)
Oral Oncology Patient Advocate Encounter  Received notification from [employer] that prior authorization for Venclexta is required.  PA submitted on CoverMyMeds Key [id number]  Status is pending  Oral Oncology Clinic will continue to follow.  [place] Patient [place] Phone [phone] Fax [phone] 11/06/2019 9:39 AM

## 2019-11-06 NOTE — Telephone Encounter (Signed)
Patient was not sure where her Venetoclax refill was sent to. Advised her to pick it up from the [facility]. Pt. erbalized understanding.

## 2019-11-09 ENCOUNTER — Telehealth: Payer: Self-pay

## 2019-11-09 NOTE — Telephone Encounter (Signed)
Oral Chemotherapy Pharmacist Encounter  [facility] will deliver medication on 11/11/19. She knows to get started we she receives the [employer].  Patient Education I spoke with patient for overview of new oral chemotherapy medication: Venclexta (venetoclax) for the treatment of CLL in conjunction with obinutuzumab, planned duration until disease progression or unacceptable drug toxicity.   Counseled patient on administration, dosing, side effects, monitoring, drug-food interactions, safe handling, storage, and disposal. Patient will take 20 mg by mouth daily for 7 days, THEN 50 mg daily for 7 days, THEN 100 mg daily for 7 days, THEN 200 mg daily for 7 days.  Side effects include but not limited to: TLS, N/V, fatigue, decreased wbc.    Reviewed with patient importance of keeping a medication schedule and plan for any missed doses.  Ms. Linders voiced understanding and appreciation. All questions answered. Medication handout placed in the mail.  Provided patient with Oral [place] [facility] phone number. Patient knows to call the office with questions or concerns. Oral Chemotherapy Navigation Clinic will continue to follow.  Darl Pikes, PharmD, BCPS, BCOP, CPP Hematology/Oncology Clinical Pharmacist [facility] [place] [facility] [phone]  11/09/2019 4:07 PM

## 2019-11-09 NOTE — Telephone Encounter (Signed)
Oral Oncology Patient Advocate Encounter  Was successful in securing patient a $8000 grant from [employer] to provide copayment coverage for Venclexta.  This will keep the out of pocket expense at $0.     [employer] ID: [id number]  I have spoken with the patient.   The billing information is as follows and has been shared with [place].    RxBin: [id number] PCN: [id number] Member ID: [id number] Group ID: [id number] Dates of Eligibility: 10/10/19 through 10/08/20  Fund:  [place] Patient [place] Phone [phone] Fax [phone] 11/09/2019 3:32 PM

## 2019-11-10 MED FILL — VENCLEXTA STARTING PACK: 10 & 50 & 1 | 28 days supply | Qty: 42 | Fill #0

## 2019-11-12 ENCOUNTER — Inpatient Hospital Stay: Payer: Medicare Other

## 2019-11-17 ENCOUNTER — Telehealth: Payer: Self-pay | Admitting: Hematology

## 2019-11-17 NOTE — Telephone Encounter (Signed)
Scheduled per 4/29 los, patient has been called and voicemail was left.

## 2019-11-18 ENCOUNTER — Other Ambulatory Visit: Payer: Self-pay | Admitting: *Deleted

## 2019-11-18 DIAGNOSIS — C9110 Chronic lymphocytic leukemia of B-cell type not having achieved remission: Secondary | ICD-10-CM

## 2019-11-19 ENCOUNTER — Other Ambulatory Visit: Payer: Self-pay

## 2019-11-19 ENCOUNTER — Inpatient Hospital Stay: Payer: Medicare Other | Attending: Hematology

## 2019-11-19 DIAGNOSIS — Z5112 Encounter for antineoplastic immunotherapy: Secondary | ICD-10-CM | POA: Diagnosis present

## 2019-11-19 DIAGNOSIS — C9110 Chronic lymphocytic leukemia of B-cell type not having achieved remission: Secondary | ICD-10-CM | POA: Diagnosis present

## 2019-11-19 LAB — CMP (CANCER CENTER ONLY)
ALT: 7 U/L (ref 0–44)
AST: 13 U/L — ABNORMAL LOW (ref 15–41)
Albumin: 3.6 g/dL (ref 3.5–5.0)
Alkaline Phosphatase: 60 U/L (ref 38–126)
Anion gap: 9 (ref 5–15)
BUN: 20 mg/dL (ref 8–23)
CO2: 25 mmol/L (ref 22–32)
Calcium: 9.2 mg/dL (ref 8.9–10.3)
Chloride: 107 mmol/L (ref 98–111)
Creatinine: 1.04 mg/dL — ABNORMAL HIGH (ref 0.44–1.00)
GFR, Est AFR Am: >60 mL/min (ref >60)
GFR, Estimated: 53 mL/min — ABNORMAL LOW (ref >60)
Glucose, Bld: 87 mg/dL (ref 70–99)
Potassium: 4 mmol/L (ref 3.5–5.1)
Sodium: 141 mmol/L (ref 135–145)
Total Bilirubin: 0.4 mg/dL (ref 0.3–1.2)
Total Protein: 6.4 g/dL — ABNORMAL LOW (ref 6.5–8.1)

## 2019-11-19 LAB — CBC WITH DIFFERENTIAL (CANCER CENTER ONLY)
Abs Immature Granulocytes: 0.04 10*3/uL (ref 0.00–0.07)
Basophils Absolute: 0 10*3/uL (ref 0.0–0.1)
Basophils Relative: 0 %
Eosinophils Absolute: 0.1 10*3/uL (ref 0.0–0.5)
Eosinophils Relative: 3 %
HCT: 29 % — ABNORMAL LOW (ref 36.0–46.0)
Hemoglobin: 8.7 g/dL — ABNORMAL LOW (ref 12.0–15.0)
Immature Granulocytes: 1 %
Lymphocytes Relative: 56 %
Lymphs Abs: 1.6 10*3/uL (ref 0.7–4.0)
MCH: 27.2 pg (ref 26.0–34.0)
MCHC: 30 g/dL (ref 30.0–36.0)
MCV: 90.6 fL (ref 80.0–100.0)
Monocytes Absolute: 0.1 10*3/uL (ref 0.1–1.0)
Monocytes Relative: 3 %
Neutro Abs: 1.1 10*3/uL — ABNORMAL LOW (ref 1.7–7.7)
Neutrophils Relative %: 37 %
Platelet Count: 274 10*3/uL (ref 150–400)
RBC: 3.2 MIL/uL — ABNORMAL LOW (ref 3.87–5.11)
RDW: 13.5 % (ref 11.5–15.5)
WBC Count: 2.9 10*3/uL — ABNORMAL LOW (ref 4.0–10.5)
nRBC: 0 % (ref 0.0–0.2)

## 2019-11-19 LAB — PHOSPHORUS: Phosphorus: 3.7 mg/dL (ref 2.5–4.6)

## 2019-11-19 LAB — MAGNESIUM: Magnesium: 1.6 mg/dL — ABNORMAL LOW (ref 1.7–2.4)

## 2019-11-19 LAB — URIC ACID: Uric Acid, Serum: 4.7 mg/dL (ref 2.5–7.1)

## 2019-11-26 ENCOUNTER — Other Ambulatory Visit: Payer: Self-pay

## 2019-11-26 ENCOUNTER — Inpatient Hospital Stay: Payer: Medicare Other

## 2019-11-26 DIAGNOSIS — C9110 Chronic lymphocytic leukemia of B-cell type not having achieved remission: Secondary | ICD-10-CM

## 2019-11-26 DIAGNOSIS — Z5112 Encounter for antineoplastic immunotherapy: Secondary | ICD-10-CM | POA: Diagnosis not present

## 2019-11-26 LAB — CMP (CANCER CENTER ONLY)
ALT: 11 U/L (ref 0–44)
AST: 15 U/L (ref 15–41)
Albumin: 3.7 g/dL (ref 3.5–5.0)
Alkaline Phosphatase: 60 U/L (ref 38–126)
Anion gap: 6 (ref 5–15)
BUN: 19 mg/dL (ref 8–23)
CO2: 28 mmol/L (ref 22–32)
Calcium: 9.6 mg/dL (ref 8.9–10.3)
Chloride: 107 mmol/L (ref 98–111)
Creatinine: 0.99 mg/dL (ref 0.44–1.00)
GFR, Est AFR Am: >60 mL/min (ref >60)
GFR, Estimated: 57 mL/min — ABNORMAL LOW (ref >60)
Glucose, Bld: 86 mg/dL (ref 70–99)
Potassium: 4.7 mmol/L (ref 3.5–5.1)
Sodium: 141 mmol/L (ref 135–145)
Total Bilirubin: 0.4 mg/dL (ref 0.3–1.2)
Total Protein: 6.5 g/dL (ref 6.5–8.1)

## 2019-11-26 LAB — MAGNESIUM: Magnesium: 1.8 mg/dL (ref 1.7–2.4)

## 2019-11-26 LAB — CBC WITH DIFFERENTIAL (CANCER CENTER ONLY)
Abs Immature Granulocytes: 0.05 10*3/uL (ref 0.00–0.07)
Basophils Absolute: 0 10*3/uL (ref 0.0–0.1)
Basophils Relative: 0 %
Eosinophils Absolute: 0.1 10*3/uL (ref 0.0–0.5)
Eosinophils Relative: 3 %
HCT: 30.8 % — ABNORMAL LOW (ref 36.0–46.0)
Hemoglobin: 9.2 g/dL — ABNORMAL LOW (ref 12.0–15.0)
Immature Granulocytes: 2 %
Lymphocytes Relative: 62 %
Lymphs Abs: 1.9 10*3/uL (ref 0.7–4.0)
MCH: 27.4 pg (ref 26.0–34.0)
MCHC: 29.9 g/dL — ABNORMAL LOW (ref 30.0–36.0)
MCV: 91.7 fL (ref 80.0–100.0)
Monocytes Absolute: 0.1 10*3/uL (ref 0.1–1.0)
Monocytes Relative: 3 %
Neutro Abs: 0.9 10*3/uL — ABNORMAL LOW (ref 1.7–7.7)
Neutrophils Relative %: 30 %
Platelet Count: 321 10*3/uL (ref 150–400)
RBC: 3.36 MIL/uL — ABNORMAL LOW (ref 3.87–5.11)
RDW: 13.8 % (ref 11.5–15.5)
WBC Count: 3 10*3/uL — ABNORMAL LOW (ref 4.0–10.5)
nRBC: 0 % (ref 0.0–0.2)

## 2019-11-26 LAB — PHOSPHORUS: Phosphorus: 3.8 mg/dL (ref 2.5–4.6)

## 2019-11-26 LAB — URIC ACID: Uric Acid, Serum: 4.7 mg/dL (ref 2.5–7.1)

## 2019-11-27 NOTE — Progress Notes (Signed)
Pharmacist Chemotherapy Monitoring - Follow Up Assessment    I verify that I have reviewed each item in the below checklist:  . Regimen for the patient is scheduled for the appropriate day and plan matches scheduled date. Marland Kitchen Appropriate non-routine labs are ordered dependent on drug ordered. . If applicable, additional medications reviewed and ordered per protocol based on lifetime cumulative doses and/or treatment regimen.   Plan for follow-up and/or issues identified: No . I-vent associated with next due treatment: No . MD and/or nursing notified: No  Britt Boozer 11/27/2019 11:35 AM

## 2019-12-02 ENCOUNTER — Other Ambulatory Visit: Payer: Self-pay | Admitting: *Deleted

## 2019-12-02 DIAGNOSIS — C9110 Chronic lymphocytic leukemia of B-cell type not having achieved remission: Secondary | ICD-10-CM

## 2019-12-02 NOTE — Progress Notes (Signed)
HEMATOLOGY/ONCOLOGY CLINIC NOTE  Date of Service: 12/03/2019  Patient Care Team: Tower, Wynelle Fanny, MD as PCP - General (Family Medicine) Galvin Proffer, [place] as Consulting Physician (Optometry) Brunetta Genera, MD as Consulting Physician (Hematology)  CHIEF COMPLAINTS/PURPOSE OF CONSULTATION:  Chronic Lymphocytic Leukemia  HISTORY OF PRESENTING ILLNESS:   Erica Carlson is a wonderful 74 y.o. female who has been referred to [place] by my colleague Dr. 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 The pt reports that she is doing well overall.   The pt reports that her SLL was initially picked up tthrough a mammogram in 2017. She notes that her CLL/SLL was stable until December 2019. She notes that she began having feelings of indigestion when she eats. She denies issues with acid reflux. The pt notes that her indigestion began making it difficult to eat, but also endorses a weakened appetite. She lost about 25 pounds, but has gained back 5 pounds. The pt had an unrevealing endoscopy on 07/18/18. She notes that she has some difficulty eating meats. She is staying well hydrated. She has experimented with eating different foods. She endorses having a "lot of gas," and puts a teaspoon of her Mylanta in her coffee every morning.  The pt denies any fevers, chills, or night sweats. She has noticed some lumps in her bilateral neck and armpits. She denies other abdominal pains as such. She denies problems passing urine, nor increased urinary frequency. She denies CP or SOB. She denies leg swelling.   Denies liver, kidney, heart, or lung problems and endorses being generally pretty healthy.  Of note prior to the patient's visit today, pt has had a PET/CT completed on 08/15/18 with results revealing Bulky lymphadenopathy in the neck, chest, abdomen, and pelvis demonstrating low level hypermetabolism throughout. 9 mm right thyroid nodule is hypermetabolic.  Thyroid ultrasound recommended to further evaluate. Mucosal thickening with air-fluid level in the left maxillary sinus. 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  Most recent lab results (09/22/18) of CBC w/diff and BMP is as follows: all values are WNL except for WBC at 16.8k, HGB at 10.9, MCHC at 29.7, ANC at 1.2k, Lymphs abs at 11.3k, Monocytes abs at 4.1k, Creatinine at 1.06.  On review of systems, pt reports frequent indigestion, weakened appetite, weight loss, weaker energy levels, bilateral neck bumps, bilateral armpit bumps, and denies CP, SOB, abdominal pain, leg swelling, fevers, chills, night sweats, lower abdominal pains, changes in bowel habits, and any other symptoms.  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 She is here for C4D1 of Gazyva. The patient's last visit with [place] was on 11/05/19. The pt reports that she is doing well overall.  The pt reports she is good. Pt started 200mg  of Ventoclax today. Pt has a sore on her butt that is slightly larger than previously. The sore also has been draining clear liquid with a little bit on blood in in. She feels that her lymph nodes have been getting smaller. Pt has had both doses of COVID19 vaccines.   Lab results today (12/03/19) of CBC w/diff and CMP is as follows: all values are WNL except for WBC at 2.6K, RBC at 3.34, Hemoglobin at 8.9, HCT at 29.6, Neutro Abs at 1.1K,  Glucose at 101, BUN at 24, Creatinine at 1.01, Total Protein at  6.2, AST at 11, GFR, Est Non Af Am at 55 12/03/19 of Phosphorus at 4.4: WNL 12/03/19 of Magnesium at 1.7: WNL 12/03/19 of Uric Acid at 5.2: WNL  On review of systems, pt reports healthy appetite, staying hydrated, sore on buttock and denies nausea, vomiting, diahrrea, fever, chills, night sweats, skin rashes, and any other symptoms.    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    Pneumonia     SURGICAL HISTORY: Past Surgical History:  Procedure Laterality Date   ABDOMINAL HYSTERECTOMY     AXILLARY LYMPH NODE BIOPSY Right 02/07/2016   back injections     BREAST SURGERY     COLONOSCOPY  2009   EYE SURGERY     retinal tear repair   KNEE SURGERY     torn ALC per pt    SOCIAL HISTORY: Social History   Socioeconomic History   Marital status: Widowed    Spouse name: n/a   Number of children: 6   Years of education: 14   Highest education level: Not on file  Occupational History   Occupation: self-employed    Employer: IT trainer    Comment: alterations and design  Tobacco Use   Smoking status: Never Smoker   Smokeless tobacco: Never Used  Substance and Sexual Activity   Alcohol use: No    Alcohol/week: 0.0 standard drinks   Drug use: No   Sexual activity: Never  Other Topics Concern   Not on file  Social History Narrative   Lives alone.   Her adult children live nearby.   Social Determinants of Health   Financial Resource Strain:    Difficulty of Paying Living Expenses:   Food Insecurity:    Worried About Charity fundraiser in the Last Year:    Arboriculturist in the Last Year:   Transportation Needs:    Film/video editor (Medical):    Lack of Transportation (Non-Medical):   Physical Activity:    Days of Exercise per Week:    Minutes of Exercise per Session:   Stress:    Feeling of Stress :   Social Connections:    Frequency of Communication with Friends and Family:    Frequency of Social Gatherings with Friends and Family:    Attends Religious Services:    Active Member of Clubs or Organizations:    Attends Music therapist:    Marital  Status:   Intimate Partner Violence:    Fear of Current or Ex-Partner:    Emotionally Abused:    Physically Abused:    Sexually Abused:     FAMILY HISTORY: Family History  Problem Relation Age of Onset   Breast cancer Sister    Hyperlipidemia Son    Schizophrenia Mother    Colon cancer Neg Hx    Colon polyps Neg Hx    Esophageal cancer Neg Hx    Rectal cancer Neg Hx    Stomach cancer Neg Hx     ALLERGIES:  is allergic to hctz [hydrochlorothiazide] and lipitor [atorvastatin].  MEDICATIONS:  Current Outpatient Medications  Medication Sig Dispense Refill   amLODipine (NORVASC) 10 MG tablet Take 1 tablet (10 mg total) by mouth daily. 30 tablet 11  Cholecalciferol (VITAMIN D-3) 5000 units TABS Take 1 tablet by mouth daily.     doxycycline (VIBRA-TABS) 100 MG tablet Take 1 tablet (100 mg total) by mouth 2 (two) times daily for 7 days. 14 tablet 0   lisinopril (ZESTRIL) 10 MG tablet Take 0.5 tablets (5 mg total) by mouth daily. 15 tablet 11   Multiple Vitamins-Minerals (PRESERVISION AREDS 2 PO) Take by mouth.     mupirocin ointment (BACTROBAN) 2 % Apply 1 application topically 2 (two) times daily. To sore on buttocks 22 g 1   ondansetron (ZOFRAN) 8 MG tablet Take 1 tablet (8 mg total) by mouth every 8 (eight) hours as needed for nausea or vomiting. 30 tablet 0   polyethylene glycol (MIRALAX / GLYCOLAX) 17 g packet Take 17 g by mouth daily.     rosuvastatin (CRESTOR) 10 MG tablet Take 1 tablet (10 mg total) by mouth daily. Please make overdue appt with Dr. Marlou Porch before anymore refills. 2nd attempt 30 tablet 11   venetoclax 100 MG TABS Take 100 mg by mouth daily. 30 tablet 1   No current facility-administered medications for this visit.   Facility-Administered Medications Ordered in Other Visits  Medication Dose Route Frequency Provider Last Rate Last Admin   obinutuzumab (GAZYVA) 1,000 mg in sodium chloride 0.9 % 250 mL (3.4483 mg/mL) chemo infusion  1,000 mg  Intravenous Once Irene Limbo, Cloria [place], MD        REVIEW OF SYSTEMS:   A 10+ POINT REVIEW OF SYSTEMS WAS OBTAINED including neurology, dermatology, psychiatry, cardiac, respiratory, lymph, extremities, GI, GU, Musculoskeletal, constitutional, breasts, reproductive, HEENT.  All pertinent positives are noted in the HPI.  All others are negative.   PHYSICAL EXAMINATION: ECOG FS:1 - Symptomatic but completely ambulatory  Vitals:   12/03/19 0904  BP: (!) 145/67  Pulse: 82  Resp: 18  Temp: 97.7 F (36.5 C)  SpO2: 100%   Wt Readings from Last 3 Encounters:  12/03/19 151 lb 14.4 oz (68.9 kg)  11/05/19 152 lb 4.8 oz (69.1 kg)  10/05/19 149 lb 6.4 oz (67.8 kg)   Body mass index is 21.19 kg/m.    GENERAL:alert, in no acute distress and comfortable SKIN: no acute rashes, no significant lesions EYES: conjunctiva are pink and non-injected, sclera anicteric OROPHARYNX: MMM, no exudates, no oropharyngeal erythema or ulceration NECK: supple, no JVD LYMPH:  no palpable lymphadenopathy in the cervical, axillary or inguinal regions LUNGS: clear to auscultation b/l with normal respiratory effort HEART: regular rate & rhythm ABDOMEN:  normoactive bowel sounds , non tender, not distended. Extremity: no pedal edema PSYCH: alert & oriented x 3 with fluent speech NEURO: no focal motor/sensory deficits  LABORATORY DATA:  I have reviewed the data as listed  . CBC Latest Ref Rng & Units 12/03/2019 11/26/2019 11/19/2019  WBC 4.0 - 10.5 K/uL 2.6(L) 3.0(L) 2.9(L)  Hemoglobin 12.0 - 15.0 g/dL 8.9(L) 9.2(L) 8.7(L)  Hematocrit 36.0 - 46.0 % 29.6(L) 30.8(L) 29.0(L)  Platelets 150 - 400 K/uL 288 321 274    . CMP Latest Ref Rng & Units 12/03/2019 11/26/2019 11/19/2019  Glucose 70 - 99 mg/dL 101(H) 86 87  BUN 8 - 23 mg/dL 24(H) 19 20  Creatinine 0.44 - 1.00 mg/dL 1.01(H) 0.99 1.04(H)  Sodium 135 - 145 mmol/L 141 141 141  Potassium 3.5 - 5.1 mmol/L 4.0 4.7 4.0  Chloride 98 - 111 mmol/L 108 107 107  CO2 22  - 32 mmol/L 24 28 25   Calcium 8.9 - 10.3 mg/dL 9.1 9.6 9.2  Total Protein 6.5 - 8.1 g/dL 6.2(L) 6.5 6.4(L)  Total Bilirubin 0.3 - 1.2 mg/dL 0.4 0.4 0.4  Alkaline Phos 38 - 126 U/L 60 60 60  AST 15 - 41 U/L 11(L) 15 13(L)  ALT 0 - 44 U/L 7 11 7     09/22/18 Right Axillary LN Biopsy:    07/31/18 Molecular Pathology:     02/21/16 Biopsy:    RADIOGRAPHIC STUDIES: I have personally reviewed the radiological images as listed and agreed with the findings in the report. No results found.  ASSESSMENT & PLAN:   74 y.o. female with  1. Chronic Lymphocytic Leukemia  02/21/16 Right Axillary LN biopsy revealed SLL, the initial diagnosis. 07/31/18 FISH CLL Prognostic Panel revealed:52.0% cells with 11q deletion Labs upon initial presentation from 09/22/18, WBC at 16.8k, HGB at 10.9, PLT at 272k. ANC at 1.2k, Lymphs at 11.3k, Monocytes at 4.1k. 09/22/18 Right Axillary LN Biopsy revealed SLL, ruling out a transformation event  08/15/18 PET/CT revealed Bulky lymphadenopathy in the neck, chest, abdomen, and pelvis demonstrating low level hypermetabolism throughout. 9 mm right thyroid nodule is hypermetabolic. Thyroid ultrasound recommended to further evaluate. Mucosal thickening with air-fluid level in the left maxillary sinus. 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  08/12/2019 PET/CT scan ([id number]) revealed disease progression in neck, chest, abdomen, pelvis and interval development of pulmonary lesions  2. At risk for tumor lysis syndrome -- no evidence of uncontrolled TLS at this time.  PLAN: -Discussed pt labwork today, 12/03/19; of CBC w/diff and CMP is as follows: all values are WNL except for WBC at 2.6K, RBC at 3.34, Hemoglobin at 8.9, HCT at 29.6, Neutro Abs at 1.1K,  Glucose at 101, BUN at 24, Creatinine at 1.01, Total Protein at 6.2, AST at 11, GFR, Est Non Af Am at 55 -Discussed 12/03/19 of Phosphorus at 4.4: WNL -Discussed 12/03/19 of Magnesium at 1.7:  WNL -Discussed 12/03/19 of Uric Acid at 5.2: WNL -Advised 2 more infusions of Gazyva then continuing pill for at least a year  -Advised any new symptoms like fevers going to ED or contacting clinic  -Advised on sore on buttock -looks like pressure sore -will prescribe antibiotic ointment and oral antibiotic  -Advised A+D ointment around sore -Advised on sitting too long -moving positions  -Advised sitting on gel pad -The pt has no prohibitive toxicities from continuing C3D1 of Gazyva at this time -Recommended that the pt continue to eat well, drink at least 2 Liters of water each day, and walk 20-30 minutes each day.  -Pt is here for C4D1 of Gazyva -Recommend stopping allopurinol  -Recommend taking OTC Vitamin B-Complex 1 pill daily  -Recommends continue taking Vitamin D -Recommends keeping 100mg  of Ventoclax instead of 200mg  -Recommends holding Ventoclax for 1 week then taking 100mg   -Will check labs again in 2 weeks  -Will get repeat scans after Gazyva infusions  -Will refer to wound care clinic  -Will see back in 2 weeks  FOLLOW UP: -Referral to wound care clinic for new pressure ulcer on buttocks -RTC with [name] with labs in 2 weeks  The total time spent in the appt was 30 minutes and more than 50% was on counseling and direct patient cares.  All of the patient's questions were answered with apparent satisfaction. The patient knows to call the clinic with any problems, questions or concerns.  Sullivan Lone MD [place] AAHIVMS [facility] Hematology/Oncology Physician [facility] [place]  (Office):       [phone] (Work cell):  [phone] (Fax):           [phone]  12/03/2019 11:49 AM  I, Dawayne Cirri am acting as a scribe for Dr. Sullivan Lone.   .I have reviewed the above documentation for accuracy and completeness, and I agree with the above. Brunetta Genera MD

## 2019-12-03 ENCOUNTER — Inpatient Hospital Stay (HOSPITAL_BASED_OUTPATIENT_CLINIC_OR_DEPARTMENT_OTHER): Payer: Medicare Other | Admitting: Hematology

## 2019-12-03 ENCOUNTER — Inpatient Hospital Stay: Payer: Medicare Other

## 2019-12-03 ENCOUNTER — Other Ambulatory Visit: Payer: Self-pay

## 2019-12-03 VITALS — BP 145/67 | HR 82 | Temp 97.7°F | Resp 18 | Ht 71.0 in | Wt 151.9 lb

## 2019-12-03 VITALS — BP 123/68 | HR 69 | Temp 97.6°F | Resp 18

## 2019-12-03 DIAGNOSIS — C9110 Chronic lymphocytic leukemia of B-cell type not having achieved remission: Secondary | ICD-10-CM

## 2019-12-03 DIAGNOSIS — Z5112 Encounter for antineoplastic immunotherapy: Secondary | ICD-10-CM | POA: Diagnosis not present

## 2019-12-03 DIAGNOSIS — L89302 Pressure ulcer of unspecified buttock, stage 2: Secondary | ICD-10-CM | POA: Diagnosis not present

## 2019-12-03 DIAGNOSIS — S71009D Unspecified open wound, unspecified hip, subsequent encounter: Secondary | ICD-10-CM | POA: Diagnosis not present

## 2019-12-03 DIAGNOSIS — D649 Anemia, unspecified: Secondary | ICD-10-CM

## 2019-12-03 DIAGNOSIS — S71109D Unspecified open wound, unspecified thigh, subsequent encounter: Secondary | ICD-10-CM

## 2019-12-03 DIAGNOSIS — Z7189 Other specified counseling: Secondary | ICD-10-CM

## 2019-12-03 LAB — CBC WITH DIFFERENTIAL (CANCER CENTER ONLY)
Abs Immature Granulocytes: 0.01 10*3/uL (ref 0.00–0.07)
Basophils Absolute: 0 10*3/uL (ref 0.0–0.1)
Basophils Relative: 0 %
Eosinophils Absolute: 0 10*3/uL (ref 0.0–0.5)
Eosinophils Relative: 1 %
HCT: 29.6 % — ABNORMAL LOW (ref 36.0–46.0)
Hemoglobin: 8.9 g/dL — ABNORMAL LOW (ref 12.0–15.0)
Immature Granulocytes: 0 %
Lymphocytes Relative: 53 %
Lymphs Abs: 1.4 10*3/uL (ref 0.7–4.0)
MCH: 26.6 pg (ref 26.0–34.0)
MCHC: 30.1 g/dL (ref 30.0–36.0)
MCV: 88.6 fL (ref 80.0–100.0)
Monocytes Absolute: 0.1 10*3/uL (ref 0.1–1.0)
Monocytes Relative: 3 %
Neutro Abs: 1.1 10*3/uL — ABNORMAL LOW (ref 1.7–7.7)
Neutrophils Relative %: 43 %
Platelet Count: 288 10*3/uL (ref 150–400)
RBC: 3.34 MIL/uL — ABNORMAL LOW (ref 3.87–5.11)
RDW: 13.7 % (ref 11.5–15.5)
WBC Count: 2.6 10*3/uL — ABNORMAL LOW (ref 4.0–10.5)
nRBC: 0 % (ref 0.0–0.2)

## 2019-12-03 LAB — CMP (CANCER CENTER ONLY)
ALT: 7 U/L (ref 0–44)
AST: 11 U/L — ABNORMAL LOW (ref 15–41)
Albumin: 3.6 g/dL (ref 3.5–5.0)
Alkaline Phosphatase: 60 U/L (ref 38–126)
Anion gap: 9 (ref 5–15)
BUN: 24 mg/dL — ABNORMAL HIGH (ref 8–23)
CO2: 24 mmol/L (ref 22–32)
Calcium: 9.1 mg/dL (ref 8.9–10.3)
Chloride: 108 mmol/L (ref 98–111)
Creatinine: 1.01 mg/dL — ABNORMAL HIGH (ref 0.44–1.00)
GFR, Est AFR Am: >60 mL/min (ref >60)
GFR, Estimated: 55 mL/min — ABNORMAL LOW (ref >60)
Glucose, Bld: 101 mg/dL — ABNORMAL HIGH (ref 70–99)
Potassium: 4 mmol/L (ref 3.5–5.1)
Sodium: 141 mmol/L (ref 135–145)
Total Bilirubin: 0.4 mg/dL (ref 0.3–1.2)
Total Protein: 6.2 g/dL — ABNORMAL LOW (ref 6.5–8.1)

## 2019-12-03 LAB — PHOSPHORUS: Phosphorus: 4.4 mg/dL (ref 2.5–4.6)

## 2019-12-03 LAB — URIC ACID: Uric Acid, Serum: 5.2 mg/dL (ref 2.5–7.1)

## 2019-12-03 LAB — MAGNESIUM: Magnesium: 1.7 mg/dL (ref 1.7–2.4)

## 2019-12-03 MED ORDER — MONTELUKAST SODIUM 10 MG PO TABS
10.0000 mg | ORAL_TABLET | Freq: Once | ORAL | Status: AC
  Administered 2019-12-03: 10 mg via ORAL

## 2019-12-03 MED ORDER — ACETAMINOPHEN 325 MG PO TABS
650.0000 mg | ORAL_TABLET | Freq: Once | ORAL | Status: AC
  Administered 2019-12-03: 650 mg via ORAL

## 2019-12-03 MED ORDER — DOXYCYCLINE HYCLATE 100 MG PO TABS: 100.0000 mg | ORAL_TABLET | Freq: Two times a day (BID) | ORAL | 0 refills | Status: AC

## 2019-12-03 MED ORDER — SODIUM CHLORIDE 0.9 % IV SOLN
1000.0000 mg | Freq: Once | INTRAVENOUS | Status: AC
  Administered 2019-12-03: 1000 mg via INTRAVENOUS
  Filled 2019-12-03: qty 40

## 2019-12-03 MED ORDER — VENETOCLAX 100 MG PO TABS: 100.0000 mg | ORAL_TABLET | Freq: Every day | ORAL | 1 refills | Status: DC

## 2019-12-03 MED ORDER — SODIUM CHLORIDE 0.9 % IV SOLN
Freq: Once | INTRAVENOUS | Status: AC
  Filled 2019-12-03: qty 250

## 2019-12-03 MED ORDER — SODIUM CHLORIDE 0.9 % IV SOLN
20.0000 mg | Freq: Once | INTRAVENOUS | Status: AC
  Administered 2019-12-03: 20 mg via INTRAVENOUS
  Filled 2019-12-03: qty 20

## 2019-12-03 MED ORDER — DIPHENHYDRAMINE HCL 50 MG/ML IJ SOLN
INTRAMUSCULAR | Status: AC
  Filled 2019-12-03: qty 1

## 2019-12-03 MED ORDER — ACETAMINOPHEN 325 MG PO TABS
ORAL_TABLET | ORAL | Status: AC
  Filled 2019-12-03: qty 2

## 2019-12-03 MED ORDER — DIPHENHYDRAMINE HCL 50 MG/ML IJ SOLN
50.0000 mg | Freq: Once | INTRAMUSCULAR | Status: AC
  Administered 2019-12-03: 50 mg via INTRAVENOUS

## 2019-12-03 MED ORDER — MUPIROCIN 2 % EX OINT: 1.0000 "application " | TOPICAL_OINTMENT | Freq: Two times a day (BID) | CUTANEOUS | 1 refills | Status: DC

## 2019-12-03 MED ORDER — MONTELUKAST SODIUM 10 MG PO TABS
ORAL_TABLET | ORAL | Status: AC
  Filled 2019-12-03: qty 1

## 2019-12-03 MED ORDER — SODIUM CHLORIDE 0.9 % IV SOLN
40.0000 mg | Freq: Once | INTRAVENOUS | Status: AC
  Administered 2019-12-03: 40 mg via INTRAVENOUS
  Filled 2019-12-03: qty 4

## 2019-12-03 NOTE — Patient Instructions (Signed)
[facility] Discharge Instructions for Patients Receiving Chemotherapy  Today you received the following chemotherapy agents: Gazyva   To help prevent nausea and vomiting after your treatment, we encourage you to take your nausea medication as directed.    If you develop nausea and vomiting that is not controlled by your nausea medication, call the clinic.   BELOW ARE SYMPTOMS THAT SHOULD BE REPORTED IMMEDIATELY:  *FEVER GREATER THAN 100.5 F  *CHILLS WITH OR WITHOUT FEVER  NAUSEA AND VOMITING THAT IS NOT CONTROLLED WITH YOUR NAUSEA MEDICATION  *UNUSUAL SHORTNESS OF BREATH  *UNUSUAL BRUISING OR BLEEDING  TENDERNESS IN MOUTH AND THROAT WITH OR WITHOUT PRESENCE OF ULCERS  *URINARY PROBLEMS  *BOWEL PROBLEMS  UNUSUAL RASH Items with * indicate a potential emergency and should be followed up as soon as possible.  Feel free to call the clinic should you have any questions or concerns. The clinic phone number is [phone].  Please show the CHEMO ALERT CARD at check-in to the Emergency Department and triage nurse.   

## 2019-12-10 ENCOUNTER — Telehealth: Payer: Self-pay | Admitting: Hematology

## 2019-12-10 ENCOUNTER — Ambulatory Visit (INDEPENDENT_AMBULATORY_CARE_PROVIDER_SITE_OTHER): Payer: Medicare Other | Admitting: Internal Medicine

## 2019-12-10 ENCOUNTER — Other Ambulatory Visit: Payer: Self-pay

## 2019-12-10 ENCOUNTER — Encounter: Payer: Self-pay | Admitting: Internal Medicine

## 2019-12-10 VITALS — BP 166/71 | HR 89 | Temp 98.0°F | Ht 61.5 in | Wt 151.0 lb

## 2019-12-10 DIAGNOSIS — L89153 Pressure ulcer of sacral region, stage 3: Secondary | ICD-10-CM

## 2019-12-10 MED ORDER — COLLAGENASE 250 UNIT/GM EX OINT: 1.0000 "application " | TOPICAL_OINTMENT | Freq: Every day | CUTANEOUS | 0 refills | Status: DC

## 2019-12-10 NOTE — Telephone Encounter (Signed)
Scheduled per 05/27 los, patient has been called and notified of upcoming appointment.

## 2019-12-10 NOTE — Patient Instructions (Addendum)
SOTHEARY GRODI It was a pleasure meeting you today.  Please follow the instructions below for your wound care  1) santyl ointment dressing changes daily 2) followed by non stick pad 3) put in place with tape  In one week please start using the dressing sent to you and stop the ointment.  You can keep the dressing    Please follow up with me in 1 weeks.  Call us at [phone] with any questions or concerns  [employer] is a medical supply company.  We have sent them an order for your supplies.  Please contact them if you do not receive your supplies in the next 72hrs.  Their number is [phone] and website is [email or web address]

## 2019-12-10 NOTE — Progress Notes (Signed)
   Subjective:     Patient ID: Erica Carlson, female    DOB: 03/23/1946, 74 y.o.   MRN: [id number]  Chief Complaint  Patient presents with  . Advice Only    for wound on buttock    HPI: The patient is a 74 y.o. female here for wound on the right buttocks  Patient states she developed a wound to the buttocks region 5 years ago.  It comes and goes and usually heals completely.  This time the wound has been present for 4 weeks and is not healing.  She is using bactroban on the wound daily with dressing changes.  She has been doing this for 2 weeks.  Prior to [place] she used an over the counter cream to the area.  She says prior to the wound development she would sit for long periods of time sewing.  She is trying to offload currently by not sitting for prolonged periods of time.    Review of Systems  All other systems reviewed and are negative.    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  has a past surgical history that includes Abdominal hysterectomy; Breast surgery; Axillary lymph node biopsy (Right, 02/07/2016); Colonoscopy (2009); Knee surgery; Eye surgery; and back injections.  reports that she has never smoked. She has never used smokeless tobacco. Objective:   Vital Signs BP (!) 166/71 (BP Location: Left Arm, Patient Position: Sitting, Cuff Size: Normal)   Pulse 89   Temp 98 F (36.7 C) (Temporal)   Ht 5' 1.5" (1.562 m)   Wt 151 lb (68.5 kg)   SpO2 100%   BMI 28.07 kg/m  Vital Signs and Nursing Note Reviewed Physical Exam  Constitutional: She is well-developed, well-nourished, and in no distress.  Skin:  Right buttocks wound measures: 4.0 x 4.0 x 0.4 cm Sloughing noted throughout the wound bed Slight erythema to the periwound  No tunneling or undermining        Assessment/Plan:     ICD-10-CM   1. Pressure ulcer of sacral region, stage 3 (HCC)  L89.153     Assessment:  Stage 3 pressure ulcer of sacral region  Patient presents with a 4 week non healing pressure ulcer.  It does not appear infected.  There was sloughing throughout and this was debrided with currette, scissors, pickups, guaze and cleanser.  A significant amount of sloughing came off.  I recommended she use santyl for the next week.  We will have close follow up.  I will eventually start hydrocolloid dressings on her.  Plan -santyl daily with dressing changes -sharp debridement in office with currette, scissors, pickups, guaze and cleanser -follow up in 1 week   Boyd Kerbs, DO 12/10/2019, 3:43 PM

## 2019-12-11 ENCOUNTER — Telehealth: Payer: Self-pay | Admitting: *Deleted

## 2019-12-11 NOTE — Telephone Encounter (Signed)
Faxed order to [facility] for supplies for the patient.  Confirmation received and copy scanned into the chart.//AB/CMA 

## 2019-12-15 ENCOUNTER — Telehealth: Payer: Self-pay | Admitting: *Deleted

## 2019-12-15 NOTE — Telephone Encounter (Signed)
Patient called - She saw Plastic surgery/Wound Care on 6/3 and was prescribed Collagenase(Santyl) to use on wound. She has not picked up Bactroban prescribed by Dr.Kale on 5/27 and wants to know if she should be using both. Dr. Irene Limbo informed. Contacted patient with Dr. Grier Mitts response: She can just use the santyl for now. She already completed po antibiotics so can hold Bactroban. Patient verbalized understanding.

## 2019-12-16 NOTE — Progress Notes (Signed)
HEMATOLOGY/ONCOLOGY CLINIC NOTE  Date of Service: 12/17/2019  Patient Care Team: Tower, Wynelle Fanny, MD as PCP - General (Family Medicine) Galvin Proffer, [place] as Consulting Physician (Optometry) Brunetta Genera, MD as Consulting Physician (Hematology)  CHIEF COMPLAINTS/PURPOSE OF CONSULTATION:  Chronic Lymphocytic Leukemia  HISTORY OF PRESENTING ILLNESS:   Erica Carlson is a wonderful 74 y.o. female who has been referred to [place] by my colleague Dr. 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 The pt reports that she is doing well overall.   The pt reports that her SLL was initially picked up tthrough a mammogram in 2017. She notes that her CLL/SLL was stable until December 2019. She notes that she began having feelings of indigestion when she eats. She denies issues with acid reflux. The pt notes that her indigestion began making it difficult to eat, but also endorses a weakened appetite. She lost about 25 pounds, but has gained back 5 pounds. The pt had an unrevealing endoscopy on 07/18/18. She notes that she has some difficulty eating meats. She is staying well hydrated. She has experimented with eating different foods. She endorses having a "lot of gas," and puts a teaspoon of her Mylanta in her coffee every morning.  The pt denies any fevers, chills, or night sweats. She has noticed some lumps in her bilateral neck and armpits. She denies other abdominal pains as such. She denies problems passing urine, nor increased urinary frequency. She denies CP or SOB. She denies leg swelling.   Denies liver, kidney, heart, or lung problems and endorses being generally pretty healthy.  Of note prior to the patient's visit today, pt has had a PET/CT completed on 08/15/18 with results revealing Bulky lymphadenopathy in the neck, chest, abdomen, and pelvis demonstrating low level hypermetabolism throughout. 9 mm right thyroid nodule is hypermetabolic.  Thyroid ultrasound recommended to further evaluate. Mucosal thickening with air-fluid level in the left maxillary sinus. 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  Most recent lab results (09/22/18) of CBC w/diff and BMP is as follows: all values are WNL except for WBC at 16.8k, HGB at 10.9, MCHC at 29.7, ANC at 1.2k, Lymphs abs at 11.3k, Monocytes abs at 4.1k, Creatinine at 1.06.  On review of systems, pt reports frequent indigestion, weakened appetite, weight loss, weaker energy levels, bilateral neck bumps, bilateral armpit bumps, and denies CP, SOB, abdominal pain, leg swelling, fevers, chills, night sweats, lower abdominal pains, changes in bowel habits, and any other symptoms.  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 The patient's last visit with [place] was on 12/03/19. The pt reports that she is doing well overall.  The pt reports she is good. Pt was seen by the wound care clinic and that has been helping. She has finished her oral antibiotics and the ulcer is looking better. Pt has been taking 1 pill of Venetoclax daily.   Lab results today (12/17/19) of CBC w/diff and CMP is as follows: all values are WNL except for WBC at 3.5K, RBC at 3.59, Hemoglobin at 9.7, HCT at 32.1, Neutro Abs at 1.1K, Total Protein at 6.1, GFR, Est Non Af Am at 59 12/17/19 of Reticulocytes is as follows: all values are WNL except for RBC at 3.52 12/17/19 of Vitamin B12 at 700: WNL 12/17/19 of Ferritin at 392 12/17/19  of Uric Acid at 4.7: WNL 12/17/19 of Iron and TIBC is as follows: all values are WNL except for TIBC at 229  On review of systems, pt reports healthy appetite, sleeping well, staying active  and denies fever, chills, night sweats, new lumps/bumps, pain along back  and any other symptoms.   MEDICAL HISTORY:  Past Medical History:    Diagnosis Date  . Allergy    spring  . Arthritis    osteo arthritis of right knee  . Cataract    forming  . Hyperlipidemia   . Hypertension   . 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 Pneumonia     SURGICAL HISTORY: Past Surgical History:  Procedure Laterality Date  . ABDOMINAL HYSTERECTOMY    . AXILLARY LYMPH NODE BIOPSY Right 02/07/2016  . back injections    . BREAST SURGERY    . COLONOSCOPY  2009  . EYE SURGERY     retinal tear repair  . KNEE SURGERY     torn ALC per pt    SOCIAL HISTORY: Social History   Socioeconomic History  . Marital status: Widowed    Spouse name: n/a  . Number of children: 6  . Years of education: 83  . Highest education level: Not on file  Occupational History  . 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 Smoking status: Never Smoker  . Smokeless tobacco: Never Used  Vaping Use  . Vaping Use: Never used  Substance and Sexual Activity  . Alcohol use: No    Alcohol/week: 0.0 standard drinks  . Drug use: No  . Sexual activity: Never  Other Topics Concern  . Not on file  Social History Narrative   Lives alone.   Her adult children live nearby.   Social Determinants of Health   Financial Resource Strain:   . Difficulty of Paying Living Expenses:   Food Insecurity:   . Worried About Charity fundraiser in the Last Year:   . Arboriculturist in the Last Year:   Transportation Needs:   . Film/video editor (Medical):   Marland Kitchen Lack of Transportation (Non-Medical):   Physical Activity:   . Days of Exercise per Week:   . Minutes of Exercise per Session:   Stress:   . Feeling of Stress :   Social Connections:   . Frequency of Communication with Friends and Family:   . Frequency of Social Gatherings with Friends and Family:   . Attends Religious Services:   . Active Member of Clubs or Organizations:   . Attends Archivist Meetings:   Marland Kitchen Marital  Status:   Intimate Partner Violence:   . Fear of Current or Ex-Partner:   . Emotionally Abused:   Marland Kitchen Physically Abused:   . Sexually Abused:     FAMILY HISTORY: Family History  Problem Relation Age of Onset  . Breast cancer Sister   . Hyperlipidemia Son   . Schizophrenia Mother   . Colon cancer Neg Hx   . Colon polyps Neg Hx   . Esophageal cancer Neg Hx   . Rectal cancer Neg Hx   . Stomach cancer Neg Hx     ALLERGIES:  is allergic to hctz [hydrochlorothiazide] and lipitor [atorvastatin].  MEDICATIONS:  Current Outpatient Medications  Medication Sig Dispense Refill  . amLODipine (NORVASC) 10 MG tablet Take 1 tablet (10 mg total) by mouth daily.  30 tablet 11  . Cholecalciferol (VITAMIN D-3) 5000 units TABS Take 1 tablet by mouth daily.    . collagenase (SANTYL) ointment Apply 1 application topically daily. 30 g 0  . lisinopril (ZESTRIL) 10 MG tablet Take 0.5 tablets (5 mg total) by mouth daily. 15 tablet 11  . Multiple Vitamins-Minerals (PRESERVISION AREDS 2 PO) Take by mouth.    . mupirocin ointment (BACTROBAN) 2 % Apply 1 application topically 2 (two) times daily. To sore on buttocks 22 g 1  . polyethylene glycol (MIRALAX / GLYCOLAX) 17 g packet Take 17 g by mouth daily.    . rosuvastatin (CRESTOR) 10 MG tablet Take 1 tablet (10 mg total) by mouth daily. Please make overdue appt with Dr. Marlou Porch before anymore refills. 2nd attempt 30 tablet 11  . venetoclax 100 MG TABS Take 100 mg by mouth daily. 30 tablet 1   No current facility-administered medications for this visit.    REVIEW OF SYSTEMS:   A 10+ POINT REVIEW OF SYSTEMS WAS OBTAINED including neurology, dermatology, psychiatry, cardiac, respiratory, lymph, extremities, GI, GU, Musculoskeletal, constitutional, breasts, reproductive, HEENT.  All pertinent positives are noted in the HPI.  All others are negative.   PHYSICAL EXAMINATION: ECOG FS:1 - Symptomatic but completely ambulatory  There were no vitals filed for this  visit. Wt Readings from Last 3 Encounters:  12/10/19 151 lb (68.5 kg)  12/03/19 151 lb 14.4 oz (68.9 kg)  11/05/19 152 lb 4.8 oz (69.1 kg)   There is no height or weight on file to calculate BMI.    GENERAL:alert, in no acute distress and comfortable SKIN: no acute rashes, no significant lesions EYES: conjunctiva are pink and non-injected, sclera anicteric OROPHARYNX: MMM, no exudates, no oropharyngeal erythema or ulceration NECK: supple, no JVD LYMPH:  no palpable lymphadenopathy in the cervical, axillary or inguinal regions LUNGS: clear to auscultation b/l with normal respiratory effort HEART: regular rate & rhythm ABDOMEN:  normoactive bowel sounds , non tender, not distended. Extremity: no pedal edema PSYCH: alert & oriented x 3 with fluent speech NEURO: no focal motor/sensory deficits  LABORATORY DATA:  I have reviewed the data as listed  . CBC Latest Ref Rng & Units 12/03/2019 11/26/2019 11/19/2019  WBC 4.0 - 10.5 K/uL 2.6(L) 3.0(L) 2.9(L)  Hemoglobin 12.0 - 15.0 g/dL 8.9(L) 9.2(L) 8.7(L)  Hematocrit 36 - 46 % 29.6(L) 30.8(L) 29.0(L)  Platelets 150 - 400 K/uL 288 321 274    . CMP Latest Ref Rng & Units 12/03/2019 11/26/2019 11/19/2019  Glucose 70 - 99 mg/dL 101(H) 86 87  BUN 8 - 23 mg/dL 24(H) 19 20  Creatinine 0.44 - 1.00 mg/dL 1.01(H) 0.99 1.04(H)  Sodium 135 - 145 mmol/L 141 141 141  Potassium 3.5 - 5.1 mmol/L 4.0 4.7 4.0  Chloride 98 - 111 mmol/L 108 107 107  CO2 22 - 32 mmol/L 24 28 25   Calcium 8.9 - 10.3 mg/dL 9.1 9.6 9.2  Total Protein 6.5 - 8.1 g/dL 6.2(L) 6.5 6.4(L)  Total Bilirubin 0.3 - 1.2 mg/dL 0.4 0.4 0.4  Alkaline Phos 38 - 126 U/L 60 60 60  AST 15 - 41 U/L 11(L) 15 13(L)  ALT 0 - 44 U/L 7 11 7     09/22/18 Right Axillary LN Biopsy:    07/31/18 Molecular Pathology:     02/21/16 Biopsy:    RADIOGRAPHIC STUDIES: I have personally reviewed the radiological images as listed and agreed with the findings in the report. No results  found.  ASSESSMENT & PLAN:  74 y.o. female with  1. Chronic Lymphocytic Leukemia  02/21/16 Right Axillary LN biopsy revealed SLL, the initial diagnosis. 07/31/18 FISH CLL Prognostic Panel revealed:52.0% cells with 11q deletion Labs upon initial presentation from 09/22/18, WBC at 16.8k, HGB at 10.9, PLT at 272k. ANC at 1.2k, Lymphs at 11.3k, Monocytes at 4.1k. 09/22/18 Right Axillary LN Biopsy revealed SLL, ruling out a transformation event  08/15/18 PET/CT revealed Bulky lymphadenopathy in the neck, chest, abdomen, and pelvis demonstrating low level hypermetabolism throughout. 9 mm right thyroid nodule is hypermetabolic. Thyroid ultrasound recommended to further evaluate. Mucosal thickening with air-fluid level in the left maxillary sinus. 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  08/12/2019 PET/CT scan ([id number]) revealed disease progression in neck, chest, abdomen, pelvis and interval development of pulmonary lesions  2. At risk for tumor lysis syndrome -- no evidence of uncontrolled TLS at this time.  PLAN: -Discussed pt labwork today, 12/17/19;  of CBC w/diff and CMP is as follows: all values are WNL except for WBC at 3.5K, RBC at 3.59, Hemoglobin at 9.7, HCT at 32.1, Neutro Abs at 1.1K, Total Protein at 6.1, GFR, Est Non Af Am at 59 -Discussed 12/17/19 of Reticulocytes is as follows: all values are WNL except for RBC at 3.52 -Discussed 12/17/19 of Vitamin B12 at 700: WNL -Discussed 12/17/19 of Ferritin at 392 -Discussed 12/17/19 of Uric Acid at 4.7: WNL -Discussed 12/17/19 of Iron and TIBC is as follows: all values are WNL except for TIBC at 229 -Advised on Pressure ulcer -advised on dressing, gel pillow for sitting  -Will repeat scans after C6 of Gazyva  -Recommends Vitamin A+D ointment around the wound as skin protectant to avoid maceration. -Advised 2 more infusions of Gazyva then continuing Venetoclax to complete1 yr of treatment.  -Advised any new symptoms  like fevers going to ED or contacting clinic  -Advised on sitting too long -moving positions  -The pt has no prohibitive toxicities from continuing C4D1 of Gazyva at this time -Recommended that the pt continue to eat well, drink at least 2 Liters of water each day, and walk 20-30 minutes each day.  -Recommend taking OTC Vitamin B-Complex 1 pill daily  -Recommends continue taking Vitamin D -Recommends keeping dose @100mg  of Ventoclax instead of 200mg  -Will get repeat scans after Gazyva infusions   FOLLOW UP: F/u as scheduled for oncology appointments on 12/31/2019  The total time spent in the appt was 30 minutes and more than 50% was on counseling and direct patient cares.  All of the patient's questions were answered with apparent satisfaction. The patient knows to call the clinic with any problems, questions or concerns.  Sullivan Lone MD [place] AAHIVMS [facility] Hematology/Oncology Physician [facility]  (Office):       [phone] (Work cell):  [phone] (Fax):           [phone]  12/17/2019 8:32 AM  I, Dawayne Cirri am acting as a Education administrator for Dr. Sullivan Lone.   .I have reviewed the above documentation for accuracy and completeness, and I agree with the above. Brunetta Genera MD

## 2019-12-17 ENCOUNTER — Ambulatory Visit (INDEPENDENT_AMBULATORY_CARE_PROVIDER_SITE_OTHER): Payer: Medicare Other | Admitting: Internal Medicine

## 2019-12-17 ENCOUNTER — Inpatient Hospital Stay (HOSPITAL_BASED_OUTPATIENT_CLINIC_OR_DEPARTMENT_OTHER): Payer: Medicare Other | Admitting: Hematology

## 2019-12-17 ENCOUNTER — Other Ambulatory Visit: Payer: Self-pay

## 2019-12-17 ENCOUNTER — Inpatient Hospital Stay: Payer: Medicare Other | Attending: Hematology

## 2019-12-17 ENCOUNTER — Encounter: Payer: Self-pay | Admitting: Internal Medicine

## 2019-12-17 VITALS — BP 166/75 | HR 75 | Temp 97.7°F | Resp 18 | Ht 61.5 in | Wt 153.9 lb

## 2019-12-17 VITALS — BP 163/71 | HR 82 | Temp 97.8°F

## 2019-12-17 DIAGNOSIS — S71009D Unspecified open wound, unspecified hip, subsequent encounter: Secondary | ICD-10-CM

## 2019-12-17 DIAGNOSIS — L89302 Pressure ulcer of unspecified buttock, stage 2: Secondary | ICD-10-CM

## 2019-12-17 DIAGNOSIS — L8990 Pressure ulcer of unspecified site, unspecified stage: Secondary | ICD-10-CM | POA: Insufficient documentation

## 2019-12-17 DIAGNOSIS — D649 Anemia, unspecified: Secondary | ICD-10-CM

## 2019-12-17 DIAGNOSIS — C9110 Chronic lymphocytic leukemia of B-cell type not having achieved remission: Secondary | ICD-10-CM | POA: Diagnosis present

## 2019-12-17 DIAGNOSIS — D709 Neutropenia, unspecified: Secondary | ICD-10-CM

## 2019-12-17 DIAGNOSIS — S71109D Unspecified open wound, unspecified thigh, subsequent encounter: Secondary | ICD-10-CM

## 2019-12-17 DIAGNOSIS — Z5112 Encounter for antineoplastic immunotherapy: Secondary | ICD-10-CM | POA: Diagnosis present

## 2019-12-17 DIAGNOSIS — L89153 Pressure ulcer of sacral region, stage 3: Secondary | ICD-10-CM | POA: Diagnosis not present

## 2019-12-17 LAB — CBC WITH DIFFERENTIAL/PLATELET
Abs Immature Granulocytes: 0.05 10*3/uL (ref 0.00–0.07)
Basophils Absolute: 0 10*3/uL (ref 0.0–0.1)
Basophils Relative: 0 %
Eosinophils Absolute: 0.1 10*3/uL (ref 0.0–0.5)
Eosinophils Relative: 3 %
HCT: 32.1 % — ABNORMAL LOW (ref 36.0–46.0)
Hemoglobin: 9.7 g/dL — ABNORMAL LOW (ref 12.0–15.0)
Immature Granulocytes: 1 %
Lymphocytes Relative: 61 %
Lymphs Abs: 2.1 10*3/uL (ref 0.7–4.0)
MCH: 27 pg (ref 26.0–34.0)
MCHC: 30.2 g/dL (ref 30.0–36.0)
MCV: 89.4 fL (ref 80.0–100.0)
Monocytes Absolute: 0.1 10*3/uL (ref 0.1–1.0)
Monocytes Relative: 4 %
Neutro Abs: 1.1 10*3/uL — ABNORMAL LOW (ref 1.7–7.7)
Neutrophils Relative %: 31 %
Platelets: 211 10*3/uL (ref 150–400)
RBC: 3.59 MIL/uL — ABNORMAL LOW (ref 3.87–5.11)
RDW: 13.5 % (ref 11.5–15.5)
WBC: 3.5 10*3/uL — ABNORMAL LOW (ref 4.0–10.5)
nRBC: 0 % (ref 0.0–0.2)

## 2019-12-17 LAB — CMP (CANCER CENTER ONLY)
ALT: 13 U/L (ref 0–44)
AST: 16 U/L (ref 15–41)
Albumin: 3.7 g/dL (ref 3.5–5.0)
Alkaline Phosphatase: 75 U/L (ref 38–126)
Anion gap: 8 (ref 5–15)
BUN: 17 mg/dL (ref 8–23)
CO2: 24 mmol/L (ref 22–32)
Calcium: 9.3 mg/dL (ref 8.9–10.3)
Chloride: 109 mmol/L (ref 98–111)
Creatinine: 0.95 mg/dL (ref 0.44–1.00)
GFR, Est AFR Am: >60 mL/min (ref >60)
GFR, Estimated: 59 mL/min — ABNORMAL LOW (ref >60)
Glucose, Bld: 89 mg/dL (ref 70–99)
Potassium: 4.3 mmol/L (ref 3.5–5.1)
Sodium: 141 mmol/L (ref 135–145)
Total Bilirubin: 0.3 mg/dL (ref 0.3–1.2)
Total Protein: 6.1 g/dL — ABNORMAL LOW (ref 6.5–8.1)

## 2019-12-17 LAB — RETICULOCYTES
Immature Retic Fract: 6.5 % (ref 2.3–15.9)
RBC.: 3.52 MIL/uL — ABNORMAL LOW (ref 3.87–5.11)
Retic Count, Absolute: 52.8 10*3/uL (ref 19.0–186.0)
Retic Ct Pct: 1.5 % (ref 0.4–3.1)

## 2019-12-17 LAB — URIC ACID: Uric Acid, Serum: 4.7 mg/dL (ref 2.5–7.1)

## 2019-12-17 LAB — IRON AND TIBC
Iron: 59 ug/dL (ref 41–142)
Saturation Ratios: 26 % (ref 21–57)
TIBC: 229 ug/dL — ABNORMAL LOW (ref 236–444)
UIBC: 170 ug/dL (ref 120–384)

## 2019-12-17 LAB — FERRITIN: Ferritin: 392 ng/mL — ABNORMAL HIGH (ref 11–307)

## 2019-12-17 LAB — VITAMIN B12: Vitamin B-12: 700 pg/mL (ref 180–914)

## 2019-12-17 NOTE — Progress Notes (Signed)
   Subjective:     Patient ID: Erica Carlson, female    DOB: 09-Jun-1946, 74 y.o.   MRN: [id number]  Chief Complaint  Patient presents with  . Follow-up    HPI: The patient is a 74 y.o. female here for follow-up of wound on the right buttocks  Patient states she has been using santyl for the past week with daily dressing changes.  She reports no issues today.  She denies acute pain, purulent drainage or increased warmth or redness to the area.  Review of Systems  All other systems reviewed and are negative.    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  has a past surgical history that includes Abdominal hysterectomy; Breast surgery; Axillary lymph node biopsy (Right, 02/07/2016); Colonoscopy (2009); Knee surgery; Eye surgery; and back injections.  reports that she has never smoked. She has never used smokeless tobacco. Objective:   Vital Signs BP (!) 163/71 (BP Location: Left Arm, Patient Position: Sitting, Cuff Size: Normal)   Pulse 82   Temp 97.8 F (36.6 C) (Temporal)   SpO2 100%  Vital Signs and Nursing Note Reviewed Physical Exam Skin:    Comments: Right buttocks wound measures:  3.5 x 4.0 x 0.4 cm Granulation tissue throughout Moderate drainage No tunneling or undermining         Assessment/Plan:     ICD-10-CM   1. Pressure ulcer of sacral region, stage 3 (HCC)  L89.153    Assessment:  Stage 3 pressure ulcer of sacral region  The wound today has improved nicely with the use of santyl daily with dressing changes.  I recommended she finish the ointment over the course of the next 2 weeks and I will see her again for follow up.  She has the hydrocolloid dressings with her today.  I will apply this dressing at the next visit upon reassessment.  Plan -santyl daily with dressing changes - follow up in 1-2 weeks  Boyd Kerbs, DO 12/17/2019, 1:57 PM

## 2019-12-17 NOTE — Patient Instructions (Signed)
Erica Carlson It was a pleasure seeing you today.  Please follow the instructions below for your wound care  1) Santyl dressing changes daily 2) followed by non stick pad 3) and tape down   Please follow up with me in 1-2 weeks.  Call us at [phone] with any questions or concerns

## 2019-12-28 ENCOUNTER — Ambulatory Visit (INDEPENDENT_AMBULATORY_CARE_PROVIDER_SITE_OTHER): Payer: Medicare Other | Admitting: Internal Medicine

## 2019-12-28 ENCOUNTER — Encounter: Payer: Self-pay | Admitting: Internal Medicine

## 2019-12-28 ENCOUNTER — Other Ambulatory Visit: Payer: Self-pay

## 2019-12-28 VITALS — BP 157/75 | HR 79 | Temp 98.0°F | Ht 62.0 in | Wt 154.0 lb

## 2019-12-28 DIAGNOSIS — L89153 Pressure ulcer of sacral region, stage 3: Secondary | ICD-10-CM

## 2019-12-28 NOTE — Progress Notes (Signed)
° °  Subjective:     Patient ID: Erica Carlson, female    DOB: [birth year], 74 y.o.   MRN: [id number]  Chief Complaint  Patient presents with   Follow-up    HPI: The patient is a 74 y.o. female here for follow-up of wound on the right buttocks  Patient states she has been using santyl daily since last visit.  She states she has about 1/4 of the ointment left.  She denies pain, drainage, increased warmth or erythema to the wound.  She has no complaints today.  Review of Systems  All other systems reviewed and are negative.    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  has a past surgical history that includes Abdominal hysterectomy; Breast surgery; Axillary lymph node biopsy (Right, 02/07/2016); Colonoscopy (2009); Knee surgery; Eye surgery; and back injections.  reports that she has never smoked. She has never used smokeless tobacco. Objective:   Vital Signs BP (!) 157/75 (BP Location: Right Arm, Patient Position: Sitting, Cuff Size: Large)    Pulse 79    Temp 98 F (36.7 C) (Temporal)    Ht 5\' 2"  (1.575 m)    Wt 154 lb (69.9 kg)    SpO2 98%    BMI 28.17 kg/m  Vital Signs and Nursing Note Reviewed Physical Exam Skin:    Comments: Right buttocks wound measures:  2.5 x 3.0 x 0.2 cm Granulation tissue throughout low drainage No tunneling or undermining         Assessment/Plan:     ICD-10-CM   1. Pressure ulcer of sacral region, stage 3 (HCC)  L89.153    Assessment: Stage 3 pressure ulcer of sacral region  The wound continues to heal nicely.  No signs of infection.  I recommended she finish the rest of santyl and then start hydrocolloid dressings.  I will see her back in two weeks.  Plan -santyl daily for one more week -start hydrocolloid dressings after stopping santyl.  Change hydrocolloid dressing every 3 days. - follow up in 2 weeks  Boyd Kerbs, DO 12/28/2019, 2:12  PM

## 2019-12-30 NOTE — Progress Notes (Signed)
HEMATOLOGY/ONCOLOGY CLINIC NOTE  Date of Service: 12/31/2019  Patient Care Team: Tower, Wynelle Fanny, MD as PCP - General (Family Medicine) Galvin Proffer, [place] as Consulting Physician (Optometry) Brunetta Genera, MD as Consulting Physician (Hematology)  CHIEF COMPLAINTS/PURPOSE OF CONSULTATION:  Chronic Lymphocytic Leukemia  HISTORY OF PRESENTING ILLNESS:   Erica Carlson is a wonderful 74 y.o. female who has been referred to [place] by my colleague Dr. 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 The pt reports that she is doing well overall.   The pt reports that her SLL was initially picked up tthrough a mammogram in 2017. She notes that her CLL/SLL was stable until December 2019. She notes that she began having feelings of indigestion when she eats. She denies issues with acid reflux. The pt notes that her indigestion began making it difficult to eat, but also endorses a weakened appetite. She lost about 25 pounds, but has gained back 5 pounds. The pt had an unrevealing endoscopy on 07/18/18. She notes that she has some difficulty eating meats. She is staying well hydrated. She has experimented with eating different foods. She endorses having a "lot of gas," and puts a teaspoon of her Mylanta in her coffee every morning.  The pt denies any fevers, chills, or night sweats. She has noticed some lumps in her bilateral neck and armpits. She denies other abdominal pains as such. She denies problems passing urine, nor increased urinary frequency. She denies CP or SOB. She denies leg swelling.   Denies liver, kidney, heart, or lung problems and endorses being generally pretty healthy.  Of note prior to the patient's visit today, pt has had a PET/CT completed on 08/15/18 with results revealing Bulky lymphadenopathy in the neck, chest, abdomen, and pelvis demonstrating low level hypermetabolism throughout. 9 mm right thyroid nodule is hypermetabolic.  Thyroid ultrasound recommended to further evaluate. Mucosal thickening with air-fluid level in the left maxillary sinus. 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  Most recent lab results (09/22/18) of CBC w/diff and BMP is as follows: all values are WNL except for WBC at 16.8k, HGB at 10.9, MCHC at 29.7, ANC at 1.2k, Lymphs abs at 11.3k, Monocytes abs at 4.1k, Creatinine at 1.06.  On review of systems, pt reports frequent indigestion, weakened appetite, weight loss, weaker energy levels, bilateral neck bumps, bilateral armpit bumps, and denies CP, SOB, abdominal pain, leg swelling, fevers, chills, night sweats, lower abdominal pains, changes in bowel habits, and any other symptoms.  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 The patient's last visit with [place] was on 12/17/19. The pt reports that she is doing well overall.  The pt reports she is good. The pt's pressure ulcer is getting better. She has a mosquito bite and it is taking longer to heal than normal. Pt also gets fatigued faster than she use to. She is not as active as she use to due to the fatigue, but will rest and continue being busy.   Lab results today (12/31/19) of CBC w/diff and CMP is as follows: all values are WNL except for WBC at 3.7K, Hemoglobin at 10.4, HCT at 34.2, Neutro Abs at 1.1K, Abs Immature Granulocytes at 0.20K, CO2 at 21, Glucose at 108, Creatinine at 1.01, Total Protein at 6.2, GFR, Est Non Af Am at 55 12/31/19  of LDH at 238  On review of systems, pt denies fever, chills, night sweats, new lumps/bumps, back pain, abdominal pain, irregular bowl movements, diarrhea and any other symptoms.    MEDICAL HISTORY:  Past Medical History:  Diagnosis Date  . Allergy    spring  . Arthritis    osteo arthritis of right knee  . Cataract    forming  .  Hyperlipidemia   . Hypertension   . 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 Pneumonia     SURGICAL HISTORY: Past Surgical History:  Procedure Laterality Date  . ABDOMINAL HYSTERECTOMY    . AXILLARY LYMPH NODE BIOPSY Right 02/07/2016  . back injections    . BREAST SURGERY    . COLONOSCOPY  2009  . EYE SURGERY     retinal tear repair  . KNEE SURGERY     torn ALC per pt    SOCIAL HISTORY: Social History   Socioeconomic History  . Marital status: Widowed    Spouse name: n/a  . Number of children: 6  . Years of education: 30  . Highest education level: Not on file  Occupational History  . 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 Smoking status: Never Smoker  . Smokeless tobacco: Never Used  Vaping Use  . Vaping Use: Never used  Substance and Sexual Activity  . Alcohol use: No    Alcohol/week: 0.0 standard drinks  . Drug use: No  . Sexual activity: Never  Other Topics Concern  . Not on file  Social History Narrative   Lives alone.   Her adult children live nearby.   Social Determinants of Health   Financial Resource Strain:   . Difficulty of Paying Living Expenses:   Food Insecurity:   . Worried About Charity fundraiser in the Last Year:   . Arboriculturist in the Last Year:   Transportation Needs:   . Film/video editor (Medical):   Marland Kitchen Lack of Transportation (Non-Medical):   Physical Activity:   . Days of Exercise per Week:   . Minutes of Exercise per Session:   Stress:   . Feeling of Stress :   Social Connections:   . Frequency of Communication with Friends and Family:   . Frequency of Social Gatherings with Friends and Family:   . Attends Religious Services:   . Active Member of Clubs or Organizations:   . Attends Archivist Meetings:   Marland Kitchen Marital Status:   Intimate Partner Violence:   . Fear of Current or Ex-Partner:   . Emotionally Abused:   Marland Kitchen Physically  Abused:   . Sexually Abused:     FAMILY HISTORY: Family History  Problem Relation Age of Onset  . Breast cancer Sister   . Hyperlipidemia Son   . Schizophrenia Mother   . Colon cancer Neg Hx   . Colon polyps Neg Hx   . Esophageal cancer Neg Hx   . Rectal cancer Neg Hx   . Stomach cancer Neg Hx     ALLERGIES:  is allergic to hctz [hydrochlorothiazide] and lipitor [atorvastatin].  MEDICATIONS:  Current Outpatient Medications  Medication Sig Dispense Refill  . amLODipine (NORVASC) 10 MG tablet Take 1 tablet (10 mg total) by mouth daily. 30 tablet 11  . Cholecalciferol (VITAMIN D-3) 5000 units TABS Take 1 tablet by mouth daily.    . collagenase (SANTYL) ointment  Apply 1 application topically daily. 30 g 0  . lisinopril (ZESTRIL) 10 MG tablet Take 0.5 tablets (5 mg total) by mouth daily. 15 tablet 11  . Multiple Vitamins-Minerals (PRESERVISION AREDS 2 PO) Take by mouth.    . mupirocin ointment (BACTROBAN) 2 % Apply 1 application topically 2 (two) times daily. To sore on buttocks (Patient not taking: Reported on 12/31/2019) 22 g 1  . polyethylene glycol (MIRALAX / GLYCOLAX) 17 g packet Take 17 g by mouth daily.    . rosuvastatin (CRESTOR) 10 MG tablet Take 1 tablet (10 mg total) by mouth daily. Please make overdue appt with Dr. Marlou Porch before anymore refills. 2nd attempt 30 tablet 11  . venetoclax 100 MG TABS Take 100 mg by mouth daily. 30 tablet 1   No current facility-administered medications for this visit.    REVIEW OF SYSTEMS:   A 10+ POINT REVIEW OF SYSTEMS WAS OBTAINED including neurology, dermatology, psychiatry, cardiac, respiratory, lymph, extremities, GI, GU, Musculoskeletal, constitutional, breasts, reproductive, HEENT.  All pertinent positives are noted in the HPI.  All others are negative.   PHYSICAL EXAMINATION: ECOG FS:1 - Symptomatic but completely ambulatory  Vitals:   12/31/19 0916  BP: (!) 166/82  Pulse: 73  Resp: 18  Temp: 97.9 F (36.6 C)  SpO2: 100%    Wt Readings from Last 3 Encounters:  12/31/19 155 lb 11.2 oz (70.6 kg)  12/28/19 154 lb (69.9 kg)  12/17/19 153 lb 14.4 oz (69.8 kg)   Body mass index is 28.48 kg/m.    GENERAL:alert, in no acute distress and comfortable SKIN: no acute rashes, no significant lesions EYES: conjunctiva are pink and non-injected, sclera anicteric OROPHARYNX: MMM, no exudates, no oropharyngeal erythema or ulceration NECK: supple, no JVD LYMPH:  no palpable lymphadenopathy in the cervical, axillary or inguinal regions LUNGS: clear to auscultation b/l with normal respiratory effort HEART: regular rate & rhythm ABDOMEN:  normoactive bowel sounds , non tender, not distended. Extremity: no pedal edema PSYCH: alert & oriented x 3 with fluent speech NEURO: no focal motor/sensory deficits  LABORATORY DATA:  I have reviewed the data as listed  . CBC Latest Ref Rng & Units 12/31/2019 12/17/2019 12/03/2019  WBC 4.0 - 10.5 K/uL 3.7(L) 3.5(L) 2.6(L)  Hemoglobin 12.0 - 15.0 g/dL 10.4(L) 9.7(L) 8.9(L)  Hematocrit 36 - 46 % 34.2(L) 32.1(L) 29.6(L)  Platelets 150 - 400 K/uL 239 211 288    . CMP Latest Ref Rng & Units 12/31/2019 12/17/2019 12/03/2019  Glucose 70 - 99 mg/dL 108(H) 89 101(H)  BUN 8 - 23 mg/dL 23 17 24(H)  Creatinine 0.44 - 1.00 mg/dL 1.01(H) 0.95 1.01(H)  Sodium 135 - 145 mmol/L 141 141 141  Potassium 3.5 - 5.1 mmol/L 3.9 4.3 4.0  Chloride 98 - 111 mmol/L 109 109 108  CO2 22 - 32 mmol/L 21(L) 24 24  Calcium 8.9 - 10.3 mg/dL 9.3 9.3 9.1  Total Protein 6.5 - 8.1 g/dL 6.2(L) 6.1(L) 6.2(L)  Total Bilirubin 0.3 - 1.2 mg/dL 0.4 0.3 0.4  Alkaline Phos 38 - 126 U/L 75 75 60  AST 15 - 41 U/L 16 16 11(L)  ALT 0 - 44 U/L 19 13 7     09/22/18 Right Axillary LN Biopsy:    07/31/18 Molecular Pathology:     02/21/16 Biopsy:    RADIOGRAPHIC STUDIES: I have personally reviewed the radiological images as listed and agreed with the findings in the report. No results found.  ASSESSMENT & PLAN:   74  y.o. female with  1. Chronic Lymphocytic Leukemia  02/21/16 Right Axillary LN biopsy revealed SLL, the initial diagnosis. 07/31/18 FISH CLL Prognostic Panel revealed:52.0% cells with 11q deletion Labs upon initial presentation from 09/22/18, WBC at 16.8k, HGB at 10.9, PLT at 272k. ANC at 1.2k, Lymphs at 11.3k, Monocytes at 4.1k. 09/22/18 Right Axillary LN Biopsy revealed SLL, ruling out a transformation event  08/15/18 PET/CT revealed Bulky lymphadenopathy in the neck, chest, abdomen, and pelvis demonstrating low level hypermetabolism throughout. 9 mm right thyroid nodule is hypermetabolic. Thyroid ultrasound recommended to further evaluate. Mucosal thickening with air-fluid level in the left maxillary sinus. 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  08/12/2019 PET/CT scan ([id number]) revealed disease progression in neck, chest, abdomen, pelvis and interval development of pulmonary lesions  2. At risk for tumor lysis syndrome -- no evidence of uncontrolled TLS at this time.  PLAN: -Discussed pt labwork today, 12/31/19; of CBC w/diff and CMP is as follows: all values are WNL except for WBC at 3.7K, Hemoglobin at 10.4, HCT at 34.2, Neutro Abs at 1.1K, Abs Immature Granulocytes at 0.20K, CO2 at 21, Glucose at 108, Creatinine at 1.01, Total Protein at 6.2, GFR, Est Non Af Am at 55 -Discussed 12/31/19 of LDH at 238 -Advised on Pressure ulcer -advised on dressing, gel pillow for sitting  -Will repeat scans after C6 of Gazyva  -Recommends Vitamin A+D ointment around the wound as skin protectant to avoid maceration. -Advised 2 more infusions of Gazyva then continuing Venetoclax to complete1 yr of treatment.  -Advised any new symptoms like fevers going to ED or contacting clinic  -Advised on sitting too long -moving positions  -The pt has no prohibitive toxicities from continuing C5D1 of Gazyva at this time -Recommended that the pt continue to eat well, drink at least 2 Liters of water  each day, and walk 20-30 minutes each day.  -Recommend taking OTC Vitamin B-Complex 1 pill daily  -Recommends continue taking Vitamin D -Recommends keeping dose @100mg  of Ventoclax  -Will get repeat scans after completion of 6 months of Gazyva infusions  -Will see back in 4 weeks    FOLLOW UP: Plz schedule next cycle of Gazyva as ordered in 4 weeks with labs and MD visit  The total time spent in the appt was 20 minutes and more than 50% was on counseling and direct patient cares.  All of the patient's questions were answered with apparent satisfaction. The patient knows to call the clinic with any problems, questions or concerns.  Sullivan Lone MD MS AAHIVMS [facility] Hematology/Oncology Physician [facility]  (Office):       [phone] (Work cell):  [phone] (Fax):           [phone]  12/31/2019 10:16 AM  I, Dawayne Cirri am acting as a Education administrator for Dr. Sullivan Lone.   .I have reviewed the above documentation for accuracy and completeness, and I agree with the above. Brunetta Genera MD

## 2019-12-31 ENCOUNTER — Inpatient Hospital Stay (HOSPITAL_BASED_OUTPATIENT_CLINIC_OR_DEPARTMENT_OTHER): Payer: Medicare Other | Admitting: Hematology

## 2019-12-31 ENCOUNTER — Other Ambulatory Visit: Payer: Self-pay

## 2019-12-31 ENCOUNTER — Inpatient Hospital Stay: Payer: Medicare Other

## 2019-12-31 VITALS — BP 138/74 | HR 63 | Temp 98.3°F | Resp 17

## 2019-12-31 VITALS — BP 166/82 | HR 73 | Temp 97.9°F | Resp 18 | Ht 62.0 in | Wt 155.7 lb

## 2019-12-31 DIAGNOSIS — C9110 Chronic lymphocytic leukemia of B-cell type not having achieved remission: Secondary | ICD-10-CM

## 2019-12-31 DIAGNOSIS — D709 Neutropenia, unspecified: Secondary | ICD-10-CM

## 2019-12-31 DIAGNOSIS — Z7189 Other specified counseling: Secondary | ICD-10-CM

## 2019-12-31 DIAGNOSIS — D649 Anemia, unspecified: Secondary | ICD-10-CM

## 2019-12-31 DIAGNOSIS — Z5112 Encounter for antineoplastic immunotherapy: Secondary | ICD-10-CM | POA: Diagnosis not present

## 2019-12-31 DIAGNOSIS — S71009D Unspecified open wound, unspecified hip, subsequent encounter: Secondary | ICD-10-CM

## 2019-12-31 LAB — CBC WITH DIFFERENTIAL/PLATELET
Abs Immature Granulocytes: 0.2 10*3/uL — ABNORMAL HIGH (ref 0.00–0.07)
Basophils Absolute: 0 10*3/uL (ref 0.0–0.1)
Basophils Relative: 1 %
Eosinophils Absolute: 0.1 10*3/uL (ref 0.0–0.5)
Eosinophils Relative: 2 %
HCT: 34.2 % — ABNORMAL LOW (ref 36.0–46.0)
Hemoglobin: 10.4 g/dL — ABNORMAL LOW (ref 12.0–15.0)
Immature Granulocytes: 5 %
Lymphocytes Relative: 58 %
Lymphs Abs: 2.2 10*3/uL (ref 0.7–4.0)
MCH: 26.9 pg (ref 26.0–34.0)
MCHC: 30.4 g/dL (ref 30.0–36.0)
MCV: 88.4 fL (ref 80.0–100.0)
Monocytes Absolute: 0.2 10*3/uL (ref 0.1–1.0)
Monocytes Relative: 5 %
Neutro Abs: 1.1 10*3/uL — ABNORMAL LOW (ref 1.7–7.7)
Neutrophils Relative %: 29 %
Platelets: 239 10*3/uL (ref 150–400)
RBC: 3.87 MIL/uL (ref 3.87–5.11)
RDW: 13.2 % (ref 11.5–15.5)
WBC: 3.7 10*3/uL — ABNORMAL LOW (ref 4.0–10.5)
nRBC: 0 % (ref 0.0–0.2)

## 2019-12-31 LAB — CMP (CANCER CENTER ONLY)
ALT: 19 U/L (ref 0–44)
AST: 16 U/L (ref 15–41)
Albumin: 3.8 g/dL (ref 3.5–5.0)
Alkaline Phosphatase: 75 U/L (ref 38–126)
Anion gap: 11 (ref 5–15)
BUN: 23 mg/dL (ref 8–23)
CO2: 21 mmol/L — ABNORMAL LOW (ref 22–32)
Calcium: 9.3 mg/dL (ref 8.9–10.3)
Chloride: 109 mmol/L (ref 98–111)
Creatinine: 1.01 mg/dL — ABNORMAL HIGH (ref 0.44–1.00)
GFR, Est AFR Am: >60 mL/min (ref >60)
GFR, Estimated: 55 mL/min — ABNORMAL LOW (ref >60)
Glucose, Bld: 108 mg/dL — ABNORMAL HIGH (ref 70–99)
Potassium: 3.9 mmol/L (ref 3.5–5.1)
Sodium: 141 mmol/L (ref 135–145)
Total Bilirubin: 0.4 mg/dL (ref 0.3–1.2)
Total Protein: 6.2 g/dL — ABNORMAL LOW (ref 6.5–8.1)

## 2019-12-31 LAB — LACTATE DEHYDROGENASE: LDH: 238 U/L — ABNORMAL HIGH (ref 98–192)

## 2019-12-31 MED ORDER — SODIUM CHLORIDE 0.9 % IV SOLN
1000.0000 mg | Freq: Once | INTRAVENOUS | Status: AC
  Administered 2019-12-31: 1000 mg via INTRAVENOUS
  Filled 2019-12-31: qty 40

## 2019-12-31 MED ORDER — MONTELUKAST SODIUM 10 MG PO TABS
10.0000 mg | ORAL_TABLET | Freq: Once | ORAL | Status: AC
  Administered 2019-12-31: 10 mg via ORAL

## 2019-12-31 MED ORDER — ACETAMINOPHEN 325 MG PO TABS
650.0000 mg | ORAL_TABLET | Freq: Once | ORAL | Status: AC
  Administered 2019-12-31: 650 mg via ORAL

## 2019-12-31 MED ORDER — MONTELUKAST SODIUM 10 MG PO TABS
ORAL_TABLET | ORAL | Status: AC
  Filled 2019-12-31: qty 1

## 2019-12-31 MED ORDER — DIPHENHYDRAMINE HCL 50 MG/ML IJ SOLN
50.0000 mg | Freq: Once | INTRAMUSCULAR | Status: AC
  Administered 2019-12-31: 50 mg via INTRAVENOUS

## 2019-12-31 MED ORDER — ACETAMINOPHEN 325 MG PO TABS
ORAL_TABLET | ORAL | Status: AC
  Filled 2019-12-31: qty 2

## 2019-12-31 MED ORDER — SODIUM CHLORIDE 0.9 % IV SOLN
Freq: Once | INTRAVENOUS | Status: AC
  Filled 2019-12-31: qty 250

## 2019-12-31 MED ORDER — SODIUM CHLORIDE 0.9 % IV SOLN
40.0000 mg | Freq: Once | INTRAVENOUS | Status: AC
  Administered 2019-12-31: 40 mg via INTRAVENOUS
  Filled 2019-12-31: qty 4

## 2019-12-31 MED ORDER — DIPHENHYDRAMINE HCL 50 MG/ML IJ SOLN
INTRAMUSCULAR | Status: AC
  Filled 2019-12-31: qty 1

## 2019-12-31 MED ORDER — SODIUM CHLORIDE 0.9 % IV SOLN
20.0000 mg | Freq: Once | INTRAVENOUS | Status: AC
  Administered 2019-12-31: 20 mg via INTRAVENOUS
  Filled 2019-12-31: qty 20

## 2019-12-31 NOTE — Patient Instructions (Signed)
Obinutuzumab injection What is this medicine? OBINUTUZUMAB (OH bi nue TOOZ ue mab) is a monoclonal antibody. It is used to treat chronic lymphocytic leukemia (CLL) and a type of non-Hodgkin lymphoma (NHL), follicular lymphoma. This medicine may be used for other purposes; ask your health care provider or pharmacist if you have questions. COMMON BRAND NAME(S): GAZYVA What should I tell my health care provider before I take this medicine? They need to know if you have any of these conditions:  infection (especially a virus infection such as hepatitis B virus)  lung or breathing disease  heart disease  take medicines that treat or prevent blood clots  an unusual or allergic reaction to obinutuzumab, other medicines, foods, dyes, or preservatives  pregnant or trying to get pregnant  breast-feeding How should I use this medicine? This medicine is for infusion into a vein. It is given by a health care professional in a hospital or clinic setting. Talk to your pediatrician regarding the use of this medicine in children. Special care may be needed. Overdosage: If you think you have taken too much of this medicine contact a poison control center or emergency room at once. NOTE: This medicine is only for you. Do not share this medicine with others. What if I miss a dose? Keep appointments for follow-up doses as directed. It is important not to miss your dose. Call your doctor or health care professional if you are unable to keep an appointment. What may interact with this medicine?  live virus vaccines This list may not describe all possible interactions. Give your health care provider a list of all the medicines, herbs, non-prescription drugs, or dietary supplements you use. Also tell them if you smoke, drink alcohol, or use illegal drugs. Some items may interact with your medicine. What should I watch for while using this medicine? Report any side effects that you notice during your  treatment right away, such as changes in your breathing, fever, chills, dizziness or lightheadedness. These effects are more common with the first dose. Visit your prescriber or health care professional for checks on your progress. You will need to have regular blood work. Report any other side effects. The side effects of this medicine can continue after you finish your treatment. Continue your course of treatment even though you feel ill unless your doctor tells you to stop. Call your doctor or health care professional for advice if you get a fever, chills or sore throat, or other symptoms of a cold or flu. Do not treat yourself. This drug decreases your body's ability to fight infections. Try to avoid being around people who are sick. This medicine may increase your risk to bruise or bleed. Call your doctor or health care professional if you notice any unusual bleeding. Do not become pregnant while taking this medicine or for 6 months after stopping it. Women should inform their doctor if they wish to become pregnant or think they might be pregnant. There is a potential for serious side effects to an unborn child. Talk to your health care professional or pharmacist for more information. Do not breast-feed an infant while taking this medicine or for 6 months after stopping it. What side effects may I notice from receiving this medicine? Side effects that you should report to your doctor or health care professional as soon as possible:  allergic reactions like skin rash, itching or hives, swelling of the face, lips, or tongue  breathing problems  changes in vision  chest pain or chest   tightness  confusion  dizziness  loss of balance or coordination  low blood counts - this medicine may decrease the number of white blood cells, red blood cells and platelets. You may be at increased risk for infections and bleeding.  signs of decreased platelets or bleeding - bruising, pinpoint red spots on  the skin, black, tarry stools, blood in the urine  signs of infection - fever or chills, cough, sore throat, pain or trouble passing urine  signs and symptoms of liver injury like dark yellow or brown urine; general ill feeling or flu-like symptoms; light-colored stools; loss of appetite; nausea; right upper belly pain; unusually weak or tired; yellowing of the eyes or skin  trouble speaking or understanding  trouble walking  vomiting Side effects that usually do not require medical attention (report to your doctor or health care professional if they continue or are bothersome):  constipation  joint pain  muscle pain This list may not describe all possible side effects. Call your doctor for medical advice about side effects. You may report side effects to FDA at 1-800-FDA-1088. Where should I keep my medicine? This drug is only given in a hospital or clinic and will not be stored at home. NOTE: This sheet is a summary. It may not cover all possible information. If you have questions about this medicine, talk to your doctor, pharmacist, or health care provider.  2020 Elsevier/Gold Standard (2018-10-07 15:34:53)  

## 2020-01-05 ENCOUNTER — Encounter: Payer: Self-pay | Admitting: Family Medicine

## 2020-01-05 ENCOUNTER — Ambulatory Visit (INDEPENDENT_AMBULATORY_CARE_PROVIDER_SITE_OTHER): Payer: Medicare Other | Admitting: Family Medicine

## 2020-01-05 ENCOUNTER — Telehealth: Payer: Self-pay

## 2020-01-05 ENCOUNTER — Other Ambulatory Visit: Payer: Self-pay

## 2020-01-05 VITALS — BP 130/58 | HR 96 | Temp 98.2°F | Wt 151.5 lb

## 2020-01-05 DIAGNOSIS — B029 Zoster without complications: Secondary | ICD-10-CM

## 2020-01-05 MED ORDER — MUPIROCIN 2 % EX OINT: 1.0000 "application " | TOPICAL_OINTMENT | Freq: Two times a day (BID) | CUTANEOUS | 0 refills | Status: DC

## 2020-01-05 MED ORDER — VALACYCLOVIR HCL 1 G PO TABS: 1000.0000 mg | ORAL_TABLET | Freq: Two times a day (BID) | ORAL | 0 refills | Status: DC

## 2020-01-05 NOTE — Telephone Encounter (Signed)
Erica Carlson has already scheduled appt with pt today with Dr Einar Pheasant at 4:20. Erica Carlson said pt was not having any difficulty breathing or throat swelling.

## 2020-01-05 NOTE — Patient Instructions (Signed)
#  Shingles - start valacyclovir - I sent in mupirocin ointment that if you don't notice the lesions continuing to improve you can start using topically

## 2020-01-05 NOTE — Telephone Encounter (Signed)
[facility] - Client TELEPHONE ADVICE RECORD [facility] Patient Name: Erica Carlson Gender: Female DOB: 1946-02-06 Age: 74 Y 3 M 7 D Return Phone Number: [phone] (Primary) Address: City/State/Zip: [name] Client [facility] - Client Client Site [place] MD Contact Type Call Who Is Calling Patient / Member / Family / Caregiver Call Type Triage / Clinical Relationship To Patient Self Return Phone Number [phone] (Primary) Chief Complaint Facial Swelling Reason for Call Symptomatic / Request for [place] states that she has swelling on the left side of her face and lips that has been going on for the past few days. She does have blisters on her chin. She used a new facial hair removal razor on friday and she noticed these symptoms on saturday. Translation No Nurse Assessment Nurse: Rock Nephew, RN, Juliann Pulse Date/Time (Eastern Time): 01/05/2020 11:49:14 AM Confirm and document reason for call. If symptomatic, describe symptoms. ---Caller stated she has swelling of her face around her mouth, lips, and chin where she used a razor to remove facial hair. She also has several small blisters on her chin. Has the patient had close contact with a person known or suspected to have the novel coronavirus illness OR traveled / lives in area with major community spread (including international travel) in the last 14 days from the onset of symptoms? * If Asymptomatic, screen for exposure and travel within the last 14 days. ---No Does the patient have any new or worsening symptoms? ---Yes Will a triage be completed? ---Yes Related visit to physician within the last 2 weeks? ---No Does the PT have any chronic conditions? (i.e. diabetes, asthma, this includes High risk factors for pregnancy, etc.) ---Yes List chronic conditions. ---cancer  treatments for Lymphoma, HTN, sees a wound care doctor for wound on her back Is this a behavioral health or substance abuse call? ---No Guidelines Guideline Title Affirmed Question Affirmed Notes Nurse Date/Time [name] Time) Face Injury Large swelling or bruise > 2 inches (5 cm) Wells, RN, Juliann Pulse 01/05/2020 11:53:30 AM PLEASE NOTE: All timestamps contained within this report are represented as [place] Standard Time. CONFIDENTIALTY NOTICE: This fax transmission is intended only for the addressee. It contains information that is legally privileged, confidential or otherwise protected from use or disclosure. If you are not the intended recipient, you are strictly prohibited from reviewing, disclosing, copying using or disseminating any of this information or taking any action in reliance on or regarding this information. If you have received this fax in error, please notify us immediately by telephone so that we can arrange for its return to [place]. Phone: [phone], Toll-Free: [phone], Fax: [phone] Page: 2 of 2 Call Id: [id number] [place]. Time [name] Time) Disposition Final User 01/05/2020 11:57:42 AM See PCP within 24 Hours Yes Rock Nephew, RN, Gara Kroner Disagree/Comply Comply Caller Understands Yes PreDisposition Call Doctor Care Advice Given Per Guideline SEE PCP WITHIN 24 HOURS: * IF OFFICE WILL BE OPEN: You need to be examined within the next 24 hours. Call your doctor (or NP/PA) when the office opens and make an appointment. USE A COLD PACK FOR PAIN, SWELLING, OR BRUISING: * Put a cold pack or an ice bag (wrapped in a moist towel) on the area for 20 minutes. * This will help decrease pain, swelling, and bruising. CALL BACK IF: * You become worse. CARE ADVICE given per Face Injury (Adult) guideline. Referrals REFERRED TO PCP OFFICE

## 2020-01-05 NOTE — Assessment & Plan Note (Signed)
Consistent with shingles. Is 48-72 hours since rash, but given swelling severity will still initiate antiviral treatment. Seems to be following V2-V3 distribution so no need for ophthalmology referral. Some of the lesions with crusting concerning for possible bacterial infection so mupirocin prescribed if no improvement in 1-2 days.

## 2020-01-05 NOTE — Progress Notes (Signed)
Subjective:     Erica Carlson is a 74 y.o. female presenting for Allergic Reaction (swelling of left side of face and lips x 4 days )     HPI   #face swelling - use a facial razor - battery operated  - did have different foods - 01/02/2020 - initially with itching and tingling which have resolved - treatment: benadryl and tylenol - swelling is about the same - breathing well - denies n/v stomach issues    Review of Systems   Social History   Tobacco Use  Smoking Status Never Smoker  Smokeless Tobacco Never Used        Objective:    BP Readings from Last 3 Encounters:  01/05/20 (!) 130/58  12/31/19 138/74  12/31/19 (!) 166/82   Wt Readings from Last 3 Encounters:  01/05/20 151 lb 8 oz (68.7 kg)  12/31/19 155 lb 11.2 oz (70.6 kg)  12/28/19 154 lb (69.9 kg)    BP (!) 130/58   Pulse 96   Temp 98.2 F (36.8 C) (Temporal)   Wt 151 lb 8 oz (68.7 kg)   SpO2 98%   BMI 27.71 kg/m    Physical Exam Constitutional:      General: She is not in acute distress.    Appearance: She is well-developed. She is not diaphoretic.  HENT:     Right Ear: Tympanic membrane, ear canal and external ear normal.     Left Ear: Tympanic membrane, ear canal and external ear normal.     Mouth/Throat:     Mouth: Mucous membranes are moist.     Comments: No oral lesions. Lower lip is swollen with mild erythema Eyes:     Conjunctiva/sclera: Conjunctivae normal.  Cardiovascular:     Rate and Rhythm: Normal rate.  Pulmonary:     Effort: Pulmonary effort is normal.  Musculoskeletal:     Cervical back: Neck supple.  Skin:    General: Skin is warm and dry.     Capillary Refill: Capillary refill takes less than 2 seconds.     Comments: Left side of face - chin and lateral to the lip with crusted lesions and erythematous base. Some vesicle like lesions present. Also with patches of swelling and erythema on the upper cheek   Neurological:     Mental Status: She is alert. Mental  status is at baseline.  Psychiatric:        Mood and Affect: Mood normal.        Behavior: Behavior normal.           Assessment & Plan:   Problem List Items Addressed This Visit      Other   Herpes zoster without complication - Primary    Consistent with shingles. Is 48-72 hours since rash, but given swelling severity will still initiate antiviral treatment. Seems to be following V2-V3 distribution so no need for ophthalmology referral. Some of the lesions with crusting concerning for possible bacterial infection so mupirocin prescribed if no improvement in 1-2 days.       Relevant Medications   valACYclovir (VALTREX) 1000 MG tablet   mupirocin ointment (BACTROBAN) 2 %       Return if symptoms worsen or fail to improve.  Lesleigh Noe, MD  This visit occurred during the SARS-CoV-2 public health emergency.  Safety protocols were in place, including screening questions prior to the visit, additional usage of staff PPE, and extensive cleaning of exam room while observing appropriate contact time  as indicated for disinfecting solutions.

## 2020-01-06 NOTE — Telephone Encounter (Signed)
See note from yesterday

## 2020-01-12 ENCOUNTER — Encounter: Payer: Self-pay | Admitting: Internal Medicine

## 2020-01-12 ENCOUNTER — Other Ambulatory Visit: Payer: Self-pay

## 2020-01-12 ENCOUNTER — Ambulatory Visit (INDEPENDENT_AMBULATORY_CARE_PROVIDER_SITE_OTHER): Payer: Medicare Other | Admitting: Internal Medicine

## 2020-01-12 VITALS — BP 174/83 | HR 85 | Temp 97.8°F

## 2020-01-12 DIAGNOSIS — L89153 Pressure ulcer of sacral region, stage 3: Secondary | ICD-10-CM | POA: Diagnosis not present

## 2020-01-12 NOTE — Progress Notes (Signed)
   Subjective:     Patient ID: Erica Carlson, female    DOB: 16-May-1946, 74 y.o.   MRN: [id number]  Chief Complaint  Patient presents with  . Follow-up of pressure ulcer    HPI: The patient is a 74 y.o. female here for follow-up of wound on the right buttocks  Patient has been using hydrocolloid dressings after finishing up santyl.  She has used about 2 dressings since last visit.  She reports improvement to the wound.  She denies pain, drainage or increased warmth or erythema to the area.    Review of Systems  All other systems reviewed and are negative.    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  has a past surgical history that includes Abdominal hysterectomy; Breast surgery; Axillary lymph node biopsy (Right, 02/07/2016); Colonoscopy (2009); Knee surgery; Eye surgery; and back injections.  reports that she has never smoked. She has never used smokeless tobacco. Objective:   Vital Signs BP (!) 174/83 (BP Location: Left Arm, Patient Position: Sitting, Cuff Size: Normal)   Pulse 85   Temp 97.8 F (36.6 C) (Temporal)   SpO2 100%  Vital Signs and Nursing Note Reviewed Physical Exam Skin:    Comments: Epithelialized right buttocks wound Surrounding tissue of wound site is soft No erythema No drainage        Assessment/Plan:     ICD-10-CM   1. Pressure ulcer of sacral region, stage 3 (HCC)  L89.153     Assessment: Stage 3 pressure ulcer of sacral region  The wound has healed well and is now closed.  I recommended continuing the hydrocolloid dressing for another week to help with the integrity of the wound healing.  I will see her back in 2 weeks.  Plan -hydrocolloid dressing every 3 days can stop in 1-2 weeks - follow up in 2 weeks  Boyd Kerbs, DO 01/12/2020, 2:17 PM

## 2020-01-18 MED FILL — VENCLEXTA 100 MG TABS: 100 | 30 days supply | Qty: 30 | Fill #1

## 2020-01-19 IMAGING — PT NM PET TUM IMG INITIAL (PI) SKULL BASE T - THIGH
1 of 8 series · 1 of 25 positions shown · non-contrast
Comparison: Chest CT scan 07/24/2018

CLINICAL DATA: Initial treatment strategy for lymphoma.

EXAM:
NUCLEAR MEDICINE PET SKULL BASE TO THIGH
TECHNIQUE: 7.4 mCi F-18 FDG was injected intravenously. Full-ring PET imaging
was performed from the skull base to thigh after the radiotracer. CT
data was obtained and used for attenuation correction and anatomic
localization.
Fasting blood glucose: 88 mg/dl

[Series 4: ct sk_thigh 5.0 b31f · axial · 5.0mm · 0.95mm/px · 1 of 209 slices shown]
[im 209/209  brain]
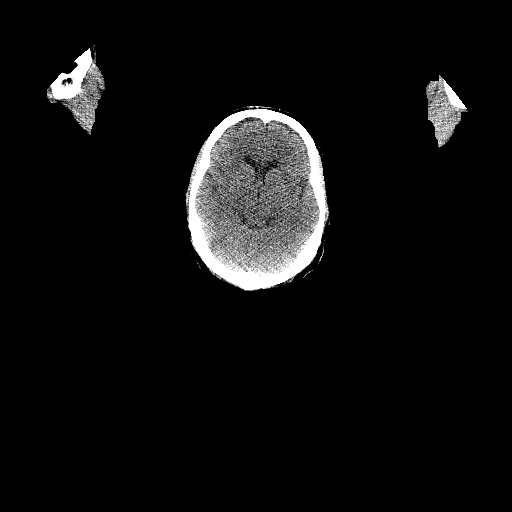

[1 of 25 positions shown; findings below may reference images not displayed]

FINDINGS: Mediastinal blood pool activity: SUV max

NECK: Relatively diffuse bilateral cervical lymphadenopathy shows
low level hypermetabolism. Index left-sided level II cervical node
demonstrates SUV max = 2.9.

Incidental CT findings: Air-fluid level in the left maxillary sinus
noted on background of mucosal thickening.

CHEST: Lymphadenopathy is seen in the supraclavicular regions,
thoracic inlet, mediastinum, bilateral axillary/subpectoral
locations.

19 mm index node right axilla (51/4) demonstrates SUV max = 2.8.

Index 16 mm short axis left axillary node (52/4) demonstrates SUV
max = 2.3.

Right thyroid nodule, not well seen on noncontrast CT today but
measuring about 9 mm on the previous diagnostic CT chest, is
hypermetabolic with SUV max = 10.

Incidental CT findings: Atherosclerotic calcification is noted in
the wall of the thoracic aorta.

ABDOMEN/PELVIS: Bulky retrocrural, retroperitoneal and mesenteric
lymphadenopathy identified in the abdomen. Bulky pelvic sidewall and
inguinal lymphadenopathy noted in the pelvis bilaterally.

17 mm short axis right retrocrural lymph node demonstrates SUV max =
2.8.

2.0 cm short axis left para-aortic lymph node (109/4) demonstrates
SUV max = 2.6.

2.8 x 3.6 cm left pelvic sidewall lymph node (149/4) demonstrates
SUV max = 2.9.

Incidental CT findings: There is abdominal aortic atherosclerosis
without aneurysm. No evidence for splenomegaly. No unexpected or
suspicious hypermetabolism in the liver or spleen. Lymphadenopathy
in the lower pelvis distorts bladder anatomy resulting in asymmetric
activity in the right pelvis.

SKELETON: No focal hypermetabolic activity to suggest skeletal
metastasis.

Incidental CT findings: none
IMPRESSION: Bulky lymphadenopathy in the neck, chest, abdomen, and pelvis
demonstrating low level hypermetabolism throughout.

9 mm right thyroid nodule is hypermetabolic. Thyroid ultrasound
recommended to further evaluate.

Mucosal thickening with air-fluid level in the left maxillary sinus.
Acute on chronic maxillary sinusitis.

Aortic Atherosclerois ([id number]-[id number])

## 2020-01-20 ENCOUNTER — Telehealth: Payer: Self-pay | Admitting: Internal Medicine

## 2020-01-20 NOTE — Telephone Encounter (Signed)
Patient called in to advise that wound seems to not be healing as well as it was at the last visit. She advised that it was doing fine and over the weekend it turned red, but no pain. She wanted to know if she should keep using the santyl or do something different. Please call patient to advise proper care.

## 2020-01-21 NOTE — Telephone Encounter (Addendum)
Returned patients call. She indicated the wound area had a couple of red spots when she took the hydrocolloid dressing off. When she removed it yesterday, the area around the wound was irritated and red. Advised her that Dr. Heber [place] suggested to stop the hydrocolloid dressing, do not use santyl, just use vasaline, tefla/nonstick pad and tape. She is aware of her follow up appointment on 01/26/20

## 2020-01-26 ENCOUNTER — Other Ambulatory Visit: Payer: Self-pay

## 2020-01-26 ENCOUNTER — Encounter: Payer: Self-pay | Admitting: Internal Medicine

## 2020-01-26 ENCOUNTER — Ambulatory Visit (INDEPENDENT_AMBULATORY_CARE_PROVIDER_SITE_OTHER): Payer: Medicare Other | Admitting: Internal Medicine

## 2020-01-26 VITALS — BP 145/69 | HR 72 | Temp 98.2°F

## 2020-01-26 DIAGNOSIS — L89153 Pressure ulcer of sacral region, stage 3: Secondary | ICD-10-CM

## 2020-01-26 NOTE — Progress Notes (Signed)
   Subjective:     Patient ID: Erica Carlson, female    DOB: 31-Jul-1945, 74 y.o.   MRN: [id number]  Chief Complaint  Patient presents with  . Follow-up of pressure ulcer    HPI: The patient is a 74 y.o. female here for follow-up of wound on the right buttocks  Patient had been using duoderm until she noticed irritation to the wound site and switched to vaseline and non stick pad.  Today she states the wound is better since she first noticed the irritation.  She currently denies pain, drainage or increased warmth or erythema to the area.      Review of Systems  All other systems reviewed and are negative.    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  has a past surgical history that includes Abdominal hysterectomy; Breast surgery; Axillary lymph node biopsy (Right, 02/07/2016); Colonoscopy (2009); Knee surgery; Eye surgery; and back injections.  reports that she has never smoked. She has never used smokeless tobacco. Objective:   Vital Signs BP (!) 145/69 (BP Location: Left Arm, Patient Position: Sitting, Cuff Size: Large)   Pulse 72   Temp 98.2 F (36.8 C) (Oral)   SpO2 100%  Vital Signs and Nursing Note Reviewed Physical Exam Skin:    Comments: Epithelialized right buttocks wound Surrounding tissue of wound site is soft No erythema No drainage         Assessment/Plan:     ICD-10-CM   1. Pressure ulcer of sacral region, stage 3 (HCC)  L89.153    Assessment: Stage 3 pressure ulcer of sacral region  The wound has healed and is closed.  The surrounding area looks well-healed with no signs of infection.  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  At this time I recommended that the patient continue to relieve pressure off the area when working and doing her ADLs. She can put vaseline and keep it covered with a non stick pad as needed.  Plan -follow up as needed     Boyd Kerbs, DO 01/26/2020, 2:58 PM

## 2020-01-27 NOTE — Progress Notes (Signed)
HEMATOLOGY/ONCOLOGY CLINIC NOTE  Date of Service: 01/28/2020  Patient Care Team: Tower, Wynelle Fanny, MD as PCP - General (Family Medicine) Galvin Proffer, [place] as Consulting Physician (Optometry) Brunetta Genera, MD as Consulting Physician (Hematology)  CHIEF COMPLAINTS/PURPOSE OF CONSULTATION:  Chronic Lymphocytic Leukemia  HISTORY OF PRESENTING ILLNESS:   Erica Carlson is a wonderful 74 y.o. female who has been referred to [place] by my colleague Dr. 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 The pt reports that she is doing well overall.   The pt reports that her SLL was initially picked up tthrough a mammogram in 2017. She notes that her CLL/SLL was stable until December 2019. She notes that she began having feelings of indigestion when she eats. She denies issues with acid reflux. The pt notes that her indigestion began making it difficult to eat, but also endorses a weakened appetite. She lost about 25 pounds, but has gained back 5 pounds. The pt had an unrevealing endoscopy on 07/18/18. She notes that she has some difficulty eating meats. She is staying well hydrated. She has experimented with eating different foods. She endorses having a "lot of gas," and puts a teaspoon of her Mylanta in her coffee every morning.  The pt denies any fevers, chills, or night sweats. She has noticed some lumps in her bilateral neck and armpits. She denies other abdominal pains as such. She denies problems passing urine, nor increased urinary frequency. She denies CP or SOB. She denies leg swelling.   Denies liver, kidney, heart, or lung problems and endorses being generally pretty healthy.  Of note prior to the patient's visit today, pt has had a PET/CT completed on 08/15/18 with results revealing Bulky lymphadenopathy in the neck, chest, abdomen, and pelvis demonstrating low level hypermetabolism throughout. 9 mm right thyroid nodule is hypermetabolic.  Thyroid ultrasound recommended to further evaluate. Mucosal thickening with air-fluid level in the left maxillary sinus. 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  Most recent lab results (09/22/18) of CBC w/diff and BMP is as follows: all values are WNL except for WBC at 16.8k, HGB at 10.9, MCHC at 29.7, ANC at 1.2k, Lymphs abs at 11.3k, Monocytes abs at 4.1k, Creatinine at 1.06.  On review of systems, pt reports frequent indigestion, weakened appetite, weight loss, weaker energy levels, bilateral neck bumps, bilateral armpit bumps, and denies CP, SOB, abdominal pain, leg swelling, fevers, chills, night sweats, lower abdominal pains, changes in bowel habits, and any other symptoms.  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 The patient's last visit with [place] was on 12/31/19. The pt reports that she is doing well overall.  The pt reports she is good. Pt has shingles on her face and she was treated for it. She was very itchy last week and had 2 welts on her back and she has put Vaseline and is almost gone. The pressure ulcer is fully healed.   Lab results today (01/28/20) of CBC w/diff and CMP is as follows: all values are WNL except for Hemoglobin at 11.6, BUN at 24, Creatinine at 1.09, GFR, Est Non Af Am at 50, GFR, Est AFR Am at 58 01/28/20 of LDH at 268  On review of systems, pt reports staying active, healthy appetite, shingles and denies fevers, chills night sweats, back pain, abdominal pain,  pedal edema and any other symptoms.   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    Pneumonia     SURGICAL HISTORY: Past Surgical History:    Procedure Laterality Date   ABDOMINAL HYSTERECTOMY     AXILLARY LYMPH NODE BIOPSY Right 02/07/2016   back injections     BREAST SURGERY     COLONOSCOPY  2009   EYE SURGERY     retinal tear repair   KNEE SURGERY     torn ALC per pt    SOCIAL HISTORY: Social History   Socioeconomic History   Marital status: Widowed    Spouse name: n/a   Number of children: 6   Years of education: 14   Highest education level: Not on file  Occupational History   Occupation: self-employed    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   Her adult children live nearby.   Social Determinants of Health   Financial Resource Strain:    Difficulty of Paying Living Expenses:   Food Insecurity:    Worried About Charity fundraiser in the Last Year:    Arboriculturist in the Last Year:   Transportation Needs:    Film/video editor (Medical):    Lack of Transportation (Non-Medical):   Physical Activity:    Days of Exercise per Week:    Minutes of Exercise per Session:   Stress:    Feeling of Stress :   Social Connections:    Frequency of Communication with Friends and Family:    Frequency of Social Gatherings with Friends and Family:    Attends Religious Services:    Active Member of Clubs or Organizations:    Attends Music therapist:    Marital Status:   Intimate Partner Violence:    Fear of Current or Ex-Partner:    Emotionally Abused:    Physically Abused:    Sexually Abused:     FAMILY HISTORY: Family History  Problem Relation Age of Onset   Breast cancer Sister    Hyperlipidemia Son     Schizophrenia Mother    Colon cancer Neg Hx    Colon polyps Neg Hx    Esophageal cancer Neg Hx    Rectal cancer Neg Hx    Stomach cancer Neg Hx     ALLERGIES:  is allergic to hctz [hydrochlorothiazide] and lipitor [atorvastatin].  MEDICATIONS:  Current Outpatient Medications  Medication Sig Dispense Refill   acyclovir (ZOVIRAX) 400 MG tablet Take 1 tablet (400 mg total) by mouth 2 (two) times daily. 60 tablet 2   amLODipine (NORVASC) 10 MG tablet Take 1 tablet (10 mg total) by mouth daily. 30 tablet 11   Cholecalciferol (VITAMIN D-3) 5000 units TABS Take 1 tablet by mouth daily.     collagenase (SANTYL) ointment Apply  1 application topically daily. 30 g 0   diphenhydrAMINE (BENADRYL) 25 mg capsule Take 25 mg by mouth every 6 (six) hours as needed for itching or allergies.     lisinopril (ZESTRIL) 10 MG tablet Take 0.5 tablets (5 mg total) by mouth daily. 15 tablet 11   Multiple Vitamins-Minerals (PRESERVISION AREDS 2 PO) Take by mouth.     mupirocin ointment (BACTROBAN) 2 % Apply 1 application topically 2 (two) times daily. 22 g 0   polyethylene glycol (MIRALAX / GLYCOLAX) 17 g packet Take 17 g by mouth daily.     rosuvastatin (CRESTOR) 10 MG tablet Take 1 tablet (10 mg total) by mouth daily. Please make overdue appt with Dr. Marlou Porch before anymore refills. 2nd attempt 30 tablet 11   venetoclax 100 MG TABS Take 100 mg by mouth daily. 30 tablet 1   No current facility-administered medications for this visit.    REVIEW OF SYSTEMS:   A 10+ POINT REVIEW OF SYSTEMS WAS OBTAINED including neurology, dermatology, psychiatry, cardiac, respiratory, lymph, extremities, GI, GU, Musculoskeletal, constitutional, breasts, reproductive, HEENT.  All pertinent positives are noted in the HPI.  All others are negative.   PHYSICAL EXAMINATION: ECOG FS:1 - Symptomatic but completely ambulatory  Vitals:   01/28/20 0931  BP: (!) 145/76  Pulse: 78  Resp: 18  Temp: (!) 97.5 F (36.4  C)  SpO2: 100%   Wt Readings from Last 3 Encounters:  01/28/20 158 lb 11.2 oz (72 kg)  01/05/20 151 lb 8 oz (68.7 kg)  12/31/19 155 lb 11.2 oz (70.6 kg)   Body mass index is 29.03 kg/m.    GENERAL:alert, in no acute distress and comfortable SKIN: no acute rashes, no significant lesions EYES: conjunctiva are pink and non-injected, sclera anicteric OROPHARYNX: MMM, no exudates, no oropharyngeal erythema or ulceration NECK: supple, no JVD LYMPH:  no palpable lymphadenopathy in the cervical, axillary or inguinal regions LUNGS: clear to auscultation b/l with normal respiratory effort HEART: regular rate & rhythm ABDOMEN:  normoactive bowel sounds , non tender, not distended. Extremity: no pedal edema PSYCH: alert & oriented x 3 with fluent speech NEURO: no focal motor/sensory deficits  LABORATORY DATA:  I have reviewed the data as listed  . CBC Latest Ref Rng & Units 01/28/2020 12/31/2019 12/17/2019  WBC 4.0 - 10.5 K/uL 5.7 3.7(L) 3.5(L)  Hemoglobin 12.0 - 15.0 g/dL 11.6(L) 10.4(L) 9.7(L)  Hematocrit 36 - 46 % 37.9 34.2(L) 32.1(L)  Platelets 150 - 400 K/uL 201 239 211    . CMP Latest Ref Rng & Units 01/28/2020 12/31/2019 12/17/2019  Glucose 70 - 99 mg/dL 71 108(H) 89  BUN 8 - 23 mg/dL 24(H) 23 17  Creatinine 0.44 - 1.00 mg/dL 1.09(H) 1.01(H) 0.95  Sodium 135 - 145 mmol/L 141 141 141  Potassium 3.5 - 5.1 mmol/L 3.7 3.9 4.3  Chloride 98 - 111 mmol/L 107 109 109  CO2 22 - 32 mmol/L 23 21(L) 24  Calcium 8.9 - 10.3 mg/dL 10.2 9.3 9.3  Total Protein 6.5 - 8.1 g/dL 6.8 6.2(L) 6.1(L)  Total Bilirubin 0.3 - 1.2 mg/dL 0.3 0.4 0.3  Alkaline Phos 38 - 126 U/L 88 75 75  AST 15 - 41 U/L 17 16 16   ALT 0 - 44 U/L 14 19 13     09/22/18 Right Axillary LN Biopsy:    07/31/18 Molecular Pathology:     02/21/16 Biopsy:    RADIOGRAPHIC STUDIES: I have personally reviewed the radiological images as listed and agreed with the findings in  the report. No results found.  ASSESSMENT & PLAN:    74 y.o. female with  1. Chronic Lymphocytic Leukemia  02/21/16 Right Axillary LN biopsy revealed SLL, the initial diagnosis. 07/31/18 FISH CLL Prognostic Panel revealed:52.0% cells with 11q deletion Labs upon initial presentation from 09/22/18, WBC at 16.8k, HGB at 10.9, PLT at 272k. ANC at 1.2k, Lymphs at 11.3k, Monocytes at 4.1k. 09/22/18 Right Axillary LN Biopsy revealed SLL, ruling out a transformation event  08/15/18 PET/CT revealed Bulky lymphadenopathy in the neck, chest, abdomen, and pelvis demonstrating low level hypermetabolism throughout. 9 mm right thyroid nodule is hypermetabolic. Thyroid ultrasound recommended to further evaluate. Mucosal thickening with air-fluid level in the left maxillary sinus. 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  08/12/2019 PET/CT scan ([id number]) revealed disease progression in neck, chest, abdomen, pelvis and interval development of pulmonary lesions  2. At risk for tumor lysis syndrome -- no evidence of uncontrolled TLS at this time.  PLAN: -Discussed pt labwork today, 01/28/20;  of CBC w/diff and CMP is as follows: all values are WNL except for Hemoglobin at 11.6, BUN at 24, Creatinine at 1.09, GFR, Est Non Af Am at 50, GFR, Est AFR Am at 58 -Discussed 01/28/20 of LDH at 268 -Advised medications she is on can increase the risk of shingles -Advised any new symptoms like fevers going to ED or contacting clinic -will hold C6D1 of Gazyva due to recent facial shingles infection. -Recommended that the pt continue to eat well, drink at least 2 Liters of water each day, and walk 20-30 minutes each day.  -Recommend taking OTC Vitamin B-Complex 1 pill daily  -Recommends continue taking Vitamin D -Recommends keeping dose @100mg  of Ventoclax  --Recommends Shingrix vaccine with PCP -Recommends acyclovir twice a day for prevention  -Will get CT chest/abd/pelvis in 7 weeks -Will see back with labs in 8 weeks  FOLLOW UP: CT  chest/abd/pelvis in 7 weeks RTC with [name] with labs in 8 weeks  The total time spent in the appt was 20 minutes and more than 50% was on counseling and direct patient cares.  All of the patient's questions were answered with apparent satisfaction. The patient knows to call the clinic with any problems, questions or concerns.  Sullivan Lone MD MS AAHIVMS [facility] Hematology/Oncology Physician [facility]  (Office):       [phone] (Work cell):  [phone] (Fax):           [phone]  01/28/2020 11:43 AM  I, Dawayne Cirri am acting as a scribe for Dr. Sullivan Lone.   .I have reviewed the above documentation for accuracy and completeness, and I agree with the above. Brunetta Genera MD

## 2020-01-28 ENCOUNTER — Inpatient Hospital Stay (HOSPITAL_BASED_OUTPATIENT_CLINIC_OR_DEPARTMENT_OTHER): Payer: Medicare Other | Admitting: Hematology

## 2020-01-28 ENCOUNTER — Other Ambulatory Visit: Payer: Self-pay

## 2020-01-28 ENCOUNTER — Inpatient Hospital Stay: Payer: Medicare Other

## 2020-01-28 ENCOUNTER — Other Ambulatory Visit: Payer: Self-pay | Admitting: *Deleted

## 2020-01-28 ENCOUNTER — Inpatient Hospital Stay: Payer: Medicare Other | Attending: Hematology

## 2020-01-28 VITALS — BP 145/76 | HR 78 | Temp 97.5°F | Resp 18 | Ht 62.0 in | Wt 158.7 lb

## 2020-01-28 DIAGNOSIS — C9110 Chronic lymphocytic leukemia of B-cell type not having achieved remission: Secondary | ICD-10-CM

## 2020-01-28 LAB — CBC WITH DIFFERENTIAL (CANCER CENTER ONLY)
Abs Immature Granulocytes: 0.06 10*3/uL (ref 0.00–0.07)
Basophils Absolute: 0 10*3/uL (ref 0.0–0.1)
Basophils Relative: 0 %
Eosinophils Absolute: 0.1 10*3/uL (ref 0.0–0.5)
Eosinophils Relative: 2 %
HCT: 37.9 % (ref 36.0–46.0)
Hemoglobin: 11.6 g/dL — ABNORMAL LOW (ref 12.0–15.0)
Immature Granulocytes: 1 %
Lymphocytes Relative: 51 %
Lymphs Abs: 2.8 10*3/uL (ref 0.7–4.0)
MCH: 26.1 pg (ref 26.0–34.0)
MCHC: 30.6 g/dL (ref 30.0–36.0)
MCV: 85.4 fL (ref 80.0–100.0)
Monocytes Absolute: 0.5 10*3/uL (ref 0.1–1.0)
Monocytes Relative: 8 %
Neutro Abs: 2.2 10*3/uL (ref 1.7–7.7)
Neutrophils Relative %: 38 %
Platelet Count: 201 10*3/uL (ref 150–400)
RBC: 4.44 MIL/uL (ref 3.87–5.11)
RDW: 13.6 % (ref 11.5–15.5)
WBC Count: 5.7 10*3/uL (ref 4.0–10.5)
nRBC: 0 % (ref 0.0–0.2)

## 2020-01-28 LAB — CMP (CANCER CENTER ONLY)
ALT: 14 U/L (ref 0–44)
AST: 17 U/L (ref 15–41)
Albumin: 3.9 g/dL (ref 3.5–5.0)
Alkaline Phosphatase: 88 U/L (ref 38–126)
Anion gap: 11 (ref 5–15)
BUN: 24 mg/dL — ABNORMAL HIGH (ref 8–23)
CO2: 23 mmol/L (ref 22–32)
Calcium: 10.2 mg/dL (ref 8.9–10.3)
Chloride: 107 mmol/L (ref 98–111)
Creatinine: 1.09 mg/dL — ABNORMAL HIGH (ref 0.44–1.00)
GFR, Est AFR Am: 58 mL/min — ABNORMAL LOW (ref >60)
GFR, Estimated: 50 mL/min — ABNORMAL LOW (ref >60)
Glucose, Bld: 71 mg/dL (ref 70–99)
Potassium: 3.7 mmol/L (ref 3.5–5.1)
Sodium: 141 mmol/L (ref 135–145)
Total Bilirubin: 0.3 mg/dL (ref 0.3–1.2)
Total Protein: 6.8 g/dL (ref 6.5–8.1)

## 2020-01-28 LAB — LACTATE DEHYDROGENASE: LDH: 268 U/L — ABNORMAL HIGH (ref 98–192)

## 2020-01-28 MED ORDER — ACYCLOVIR 400 MG PO TABS
400.0000 mg | ORAL_TABLET | Freq: Two times a day (BID) | ORAL | 2 refills | Status: DC
Start: 2020-01-28 — End: 2020-03-23

## 2020-02-04 ENCOUNTER — Telehealth: Payer: Self-pay | Admitting: Hematology

## 2020-02-04 NOTE — Telephone Encounter (Signed)
Scheduled per 07/23 los, patient has been called and notified.

## 2020-02-11 ENCOUNTER — Other Ambulatory Visit: Payer: Self-pay | Admitting: Hematology

## 2020-02-15 MED FILL — VENCLEXTA 100 MG TABS: 100 | 30 days supply | Qty: 30 | Fill #0

## 2020-02-26 IMAGING — US ULTRASOUND OF THE LYMPH NODES
1 series · 13 of 16 positions shown · non-contrast
Comparison: PET-CT-08/15/2018;

INDICATION: History of small lymphocytic lymphoma, now with bulky
lymphadenopathy.

Please perform ultrasound-guided biopsy of bulky right axillary
lymphadenopathy for tissue diagnostic purposes.
EXAM:
ULTRASOUND-GUIDED RIGHT AXILLARY LYMPH NODE BIOPSY.
TECHNIQUE: Informed written consent was obtained from the patient after a
discussion of the risks, benefits and alternatives to treatment.
Questions regarding the procedure were encouraged and answered.
Initial ultrasound scanning demonstrated multiple enlarged right
axillary lymph nodes. A dominant right axillary lymph node measuring
approximately 2.4 x 2.2 cm was targeted for biopsy given location
and sonographic window (image 12). An ultrasound image was saved for
documentation purposes. The procedure was planned. A timeout was
performed prior to the initiation of the procedure.

[Series 1: ultrasound of the lymph nodes · 13 of 26 slices shown]
[im 1/26]
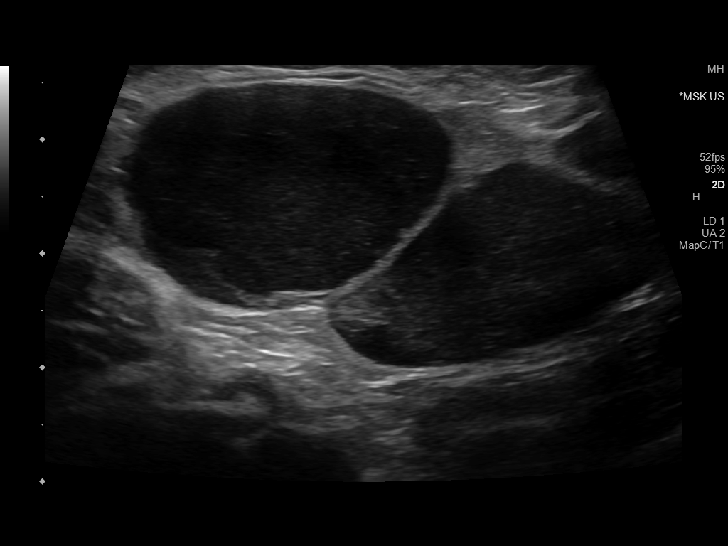
[im 2/26]
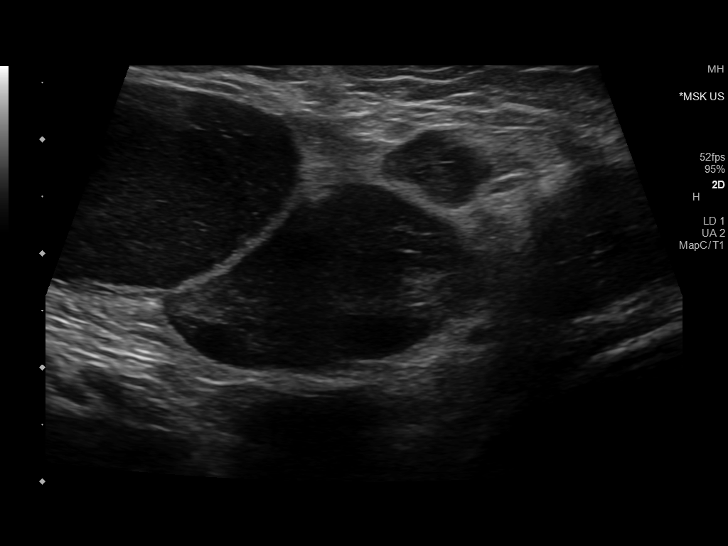
[im 6/26]
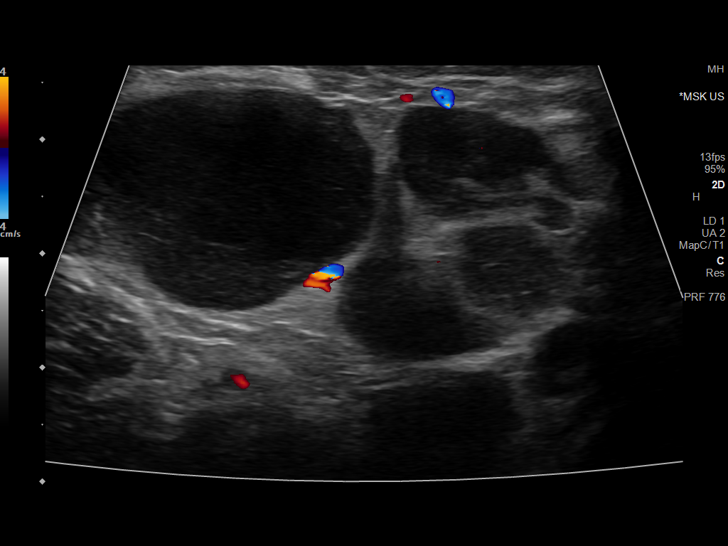
[im 7/26]
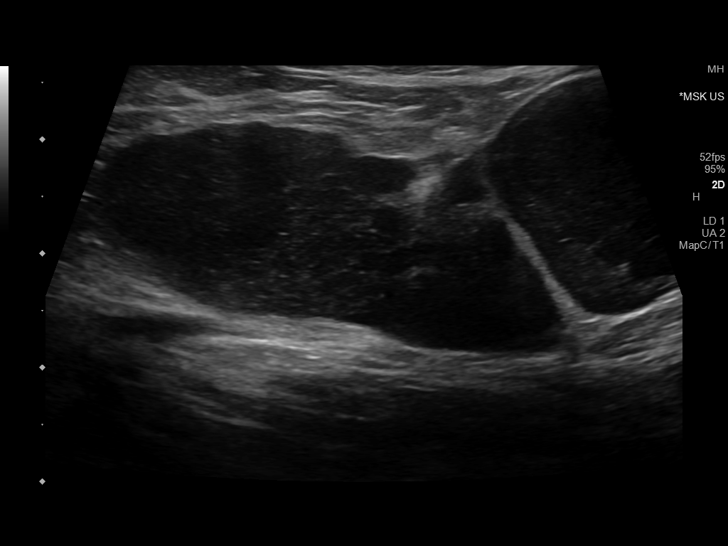
[im 9/26]
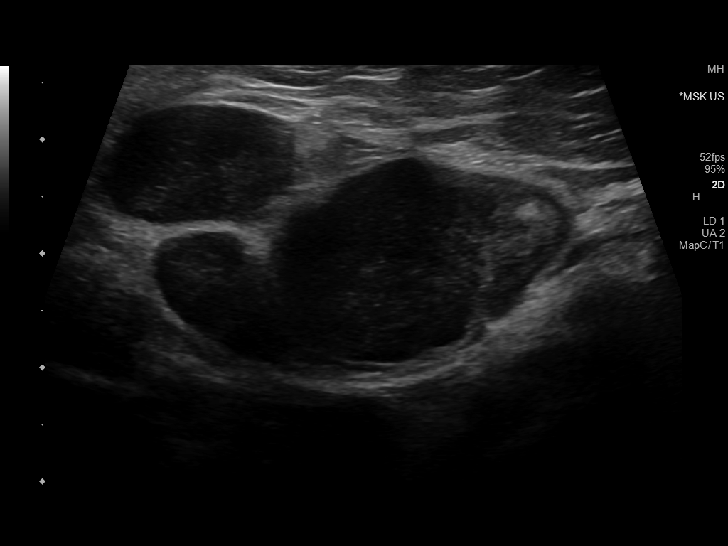
[im 11/26]
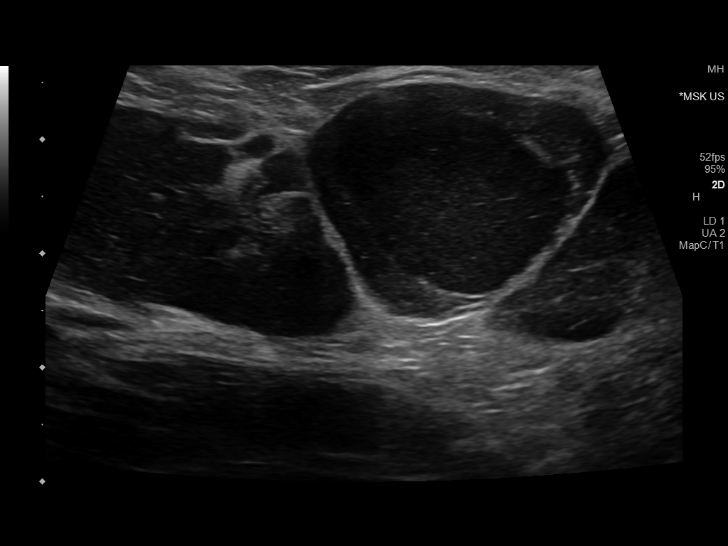
[im 14/26]
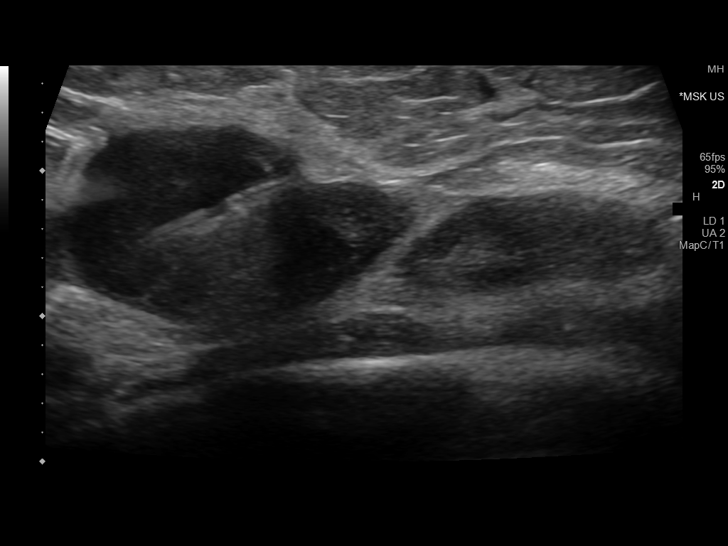
[im 16/26]
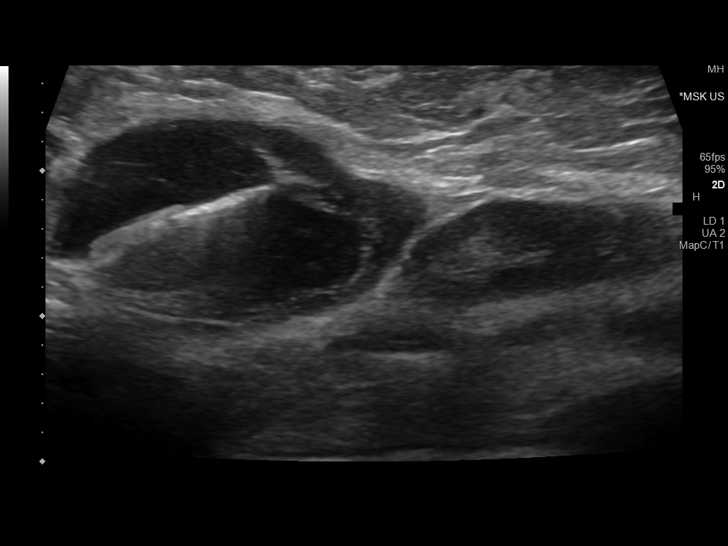
[im 17/26]
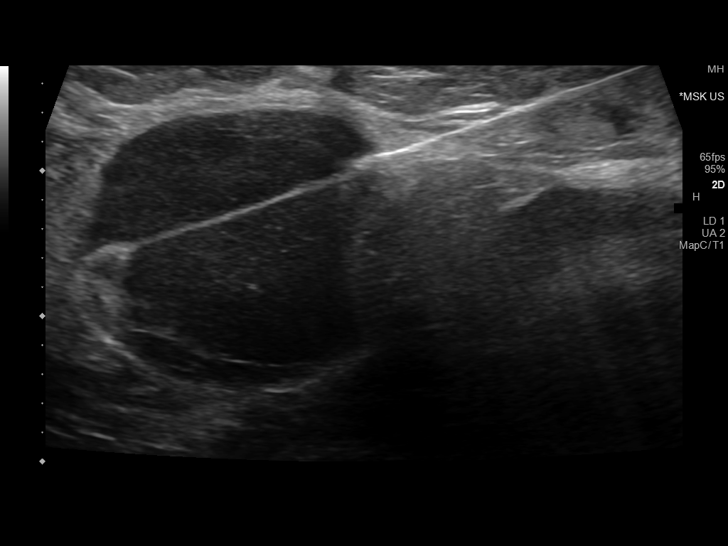
[im 19/26]
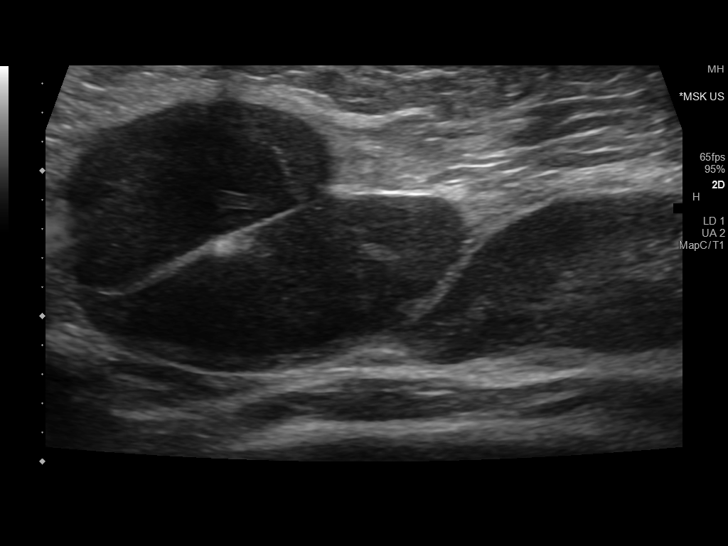
[im 21/26]
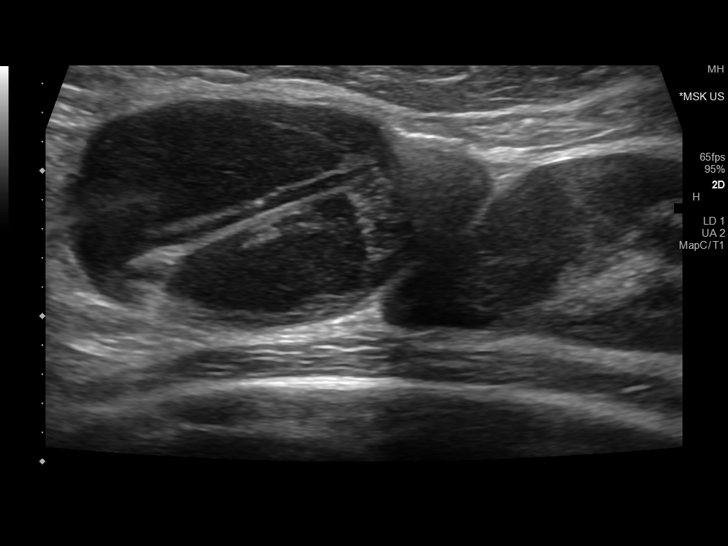
[im 24/26]
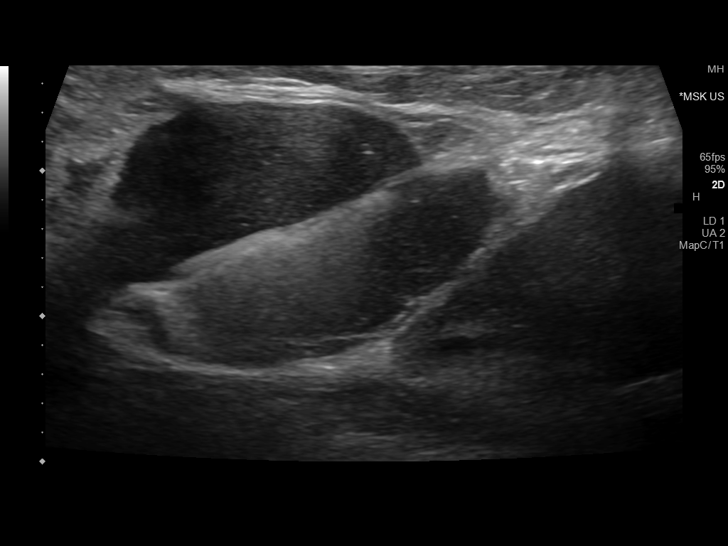
[im 26/26]
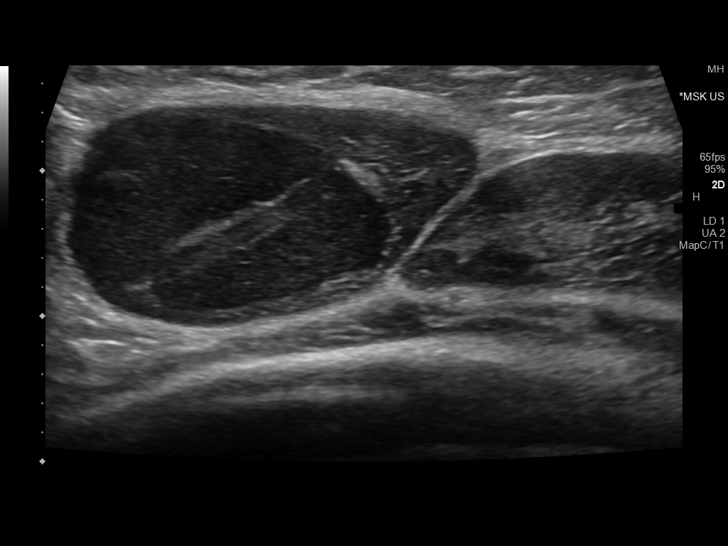

[13 of 16 positions shown; findings below may reference images not displayed]

chest CT-07/24/2018

MEDICATIONS:
None

ANESTHESIA/SEDATION:
Moderate (conscious) sedation was employed during this procedure. A
total of Versed 2 mg and Fentanyl 50 mcg was administered
intravenously.

Moderate Sedation Time: 10 minutes. The patient's level of
consciousness and vital signs were monitored continuously by
radiology nursing throughout the procedure under my direct
supervision.

COMPLICATIONS:
None immediate.
The operative was prepped and draped in the usual sterile fashion,
and a sterile drape was applied covering the operative field. A
timeout was performed prior to the initiation of the procedure.
Local anesthesia was provided with 1% lidocaine with epinephrine.

Under direct ultrasound guidance, an 18 gauge core needle device was
utilized to obtain to obtain 6 core needle biopsies of the targeted
dominant right axillary lymph node.

The samples were placed in saline and submitted to pathology. The
needle was removed and hemostasis was achieved with manual
compression. Post procedure scan was negative for significant
hematoma. A dressing was placed. The patient tolerated the procedure
well without immediate postprocedural complication.
IMPRESSION: Technically successful ultrasound guided biopsy of dominant right
axillary lymph node.

## 2020-03-15 MED FILL — VENCLEXTA 100 MG TABS: 100 | 30 days supply | Qty: 30 | Fill #1

## 2020-03-16 ENCOUNTER — Other Ambulatory Visit: Payer: Self-pay | Admitting: *Deleted

## 2020-03-16 DIAGNOSIS — C9110 Chronic lymphocytic leukemia of B-cell type not having achieved remission: Secondary | ICD-10-CM

## 2020-03-17 ENCOUNTER — Ambulatory Visit (HOSPITAL_COMMUNITY): Payer: Medicare Other

## 2020-03-23 ENCOUNTER — Ambulatory Visit (HOSPITAL_COMMUNITY)
Admission: RE | Admit: 2020-03-23 | Discharge: 2020-03-23 | Disposition: A | Discharge status: Home / Self Care | Payer: Medicare Other | Source: Ambulatory Visit | Attending: Hematology | Admitting: Hematology

## 2020-03-23 ENCOUNTER — Inpatient Hospital Stay (HOSPITAL_BASED_OUTPATIENT_CLINIC_OR_DEPARTMENT_OTHER): Payer: Medicare Other | Admitting: Hematology

## 2020-03-23 ENCOUNTER — Inpatient Hospital Stay: Payer: Medicare Other

## 2020-03-23 ENCOUNTER — Telehealth: Payer: Self-pay | Admitting: Hematology

## 2020-03-23 ENCOUNTER — Other Ambulatory Visit: Payer: Self-pay

## 2020-03-23 ENCOUNTER — Inpatient Hospital Stay: Payer: Medicare Other | Attending: Hematology

## 2020-03-23 VITALS — BP 156/71 | HR 77 | Temp 97.8°F | Resp 18 | Ht 62.0 in | Wt 166.0 lb

## 2020-03-23 DIAGNOSIS — R7401 Elevation of levels of liver transaminase levels: Secondary | ICD-10-CM | POA: Insufficient documentation

## 2020-03-23 DIAGNOSIS — C9110 Chronic lymphocytic leukemia of B-cell type not having achieved remission: Secondary | ICD-10-CM | POA: Diagnosis present

## 2020-03-23 DIAGNOSIS — Z23 Encounter for immunization: Secondary | ICD-10-CM | POA: Insufficient documentation

## 2020-03-23 LAB — CBC WITH DIFFERENTIAL/PLATELET
Abs Immature Granulocytes: 0.06 10*3/uL (ref 0.00–0.07)
Basophils Absolute: 0 10*3/uL (ref 0.0–0.1)
Basophils Relative: 1 %
Eosinophils Absolute: 0.3 10*3/uL (ref 0.0–0.5)
Eosinophils Relative: 5 %
HCT: 40.9 % (ref 36.0–46.0)
Hemoglobin: 12.8 g/dL (ref 12.0–15.0)
Immature Granulocytes: 1 %
Lymphocytes Relative: 54 %
Lymphs Abs: 3.4 10*3/uL (ref 0.7–4.0)
MCH: 26 pg (ref 26.0–34.0)
MCHC: 31.3 g/dL (ref 30.0–36.0)
MCV: 83.1 fL (ref 80.0–100.0)
Monocytes Absolute: 0.4 10*3/uL (ref 0.1–1.0)
Monocytes Relative: 7 %
Neutro Abs: 2 10*3/uL (ref 1.7–7.7)
Neutrophils Relative %: 32 %
Platelets: 213 10*3/uL (ref 150–400)
RBC: 4.92 MIL/uL (ref 3.87–5.11)
RDW: 14.5 % (ref 11.5–15.5)
WBC: 6.2 10*3/uL (ref 4.0–10.5)
nRBC: 0 % (ref 0.0–0.2)

## 2020-03-23 LAB — CMP (CANCER CENTER ONLY)
ALT: 20 U/L (ref 0–44)
AST: 20 U/L (ref 15–41)
Albumin: 3.8 g/dL (ref 3.5–5.0)
Alkaline Phosphatase: 105 U/L (ref 38–126)
Anion gap: 7 (ref 5–15)
BUN: 17 mg/dL (ref 8–23)
CO2: 25 mmol/L (ref 22–32)
Calcium: 9.4 mg/dL (ref 8.9–10.3)
Chloride: 108 mmol/L (ref 98–111)
Creatinine: 1.14 mg/dL — ABNORMAL HIGH (ref 0.44–1.00)
GFR, Est AFR Am: 55 mL/min — ABNORMAL LOW (ref >60)
GFR, Estimated: 48 mL/min — ABNORMAL LOW (ref >60)
Glucose, Bld: 65 mg/dL — ABNORMAL LOW (ref 70–99)
Potassium: 3.5 mmol/L (ref 3.5–5.1)
Sodium: 140 mmol/L (ref 135–145)
Total Bilirubin: 0.3 mg/dL (ref 0.3–1.2)
Total Protein: 6.8 g/dL (ref 6.5–8.1)

## 2020-03-23 LAB — POCT I-STAT CREATININE: Creatinine, Ser: 1.1 mg/dL — ABNORMAL HIGH (ref 0.44–1.00)

## 2020-03-23 LAB — LACTATE DEHYDROGENASE: LDH: 298 U/L — ABNORMAL HIGH (ref 98–192)

## 2020-03-23 MED ORDER — VENCLEXTA 100 MG PO TABS: 2.0000 | ORAL_TABLET | Freq: Every day | ORAL | 1 refills | Status: DC

## 2020-03-23 MED ORDER — IOHEXOL 300 MG/ML  SOLN
100.0000 mL | Freq: Once | INTRAMUSCULAR | Status: AC | PRN
  Administered 2020-03-23: 100 mL via INTRAVENOUS

## 2020-03-23 MED ORDER — ACYCLOVIR 400 MG PO TABS
400.0000 mg | ORAL_TABLET | Freq: Two times a day (BID) | ORAL | 2 refills | Status: DC
Start: 2020-03-23 — End: 2020-10-04

## 2020-03-23 MED FILL — ACYCLOVIR 400 MG TABLET: 400 | 60 days supply | Qty: 60 | Fill #0

## 2020-03-23 NOTE — Telephone Encounter (Signed)
Scheduled per 9/15 los. No avs or calendar needed to be printed. Pt is mychart active.

## 2020-03-23 NOTE — Progress Notes (Signed)
HEMATOLOGY/ONCOLOGY CLINIC NOTE  Date of Service: 03/23/2020  Patient Care Team: Tower, Wynelle Fanny, MD as PCP - General (Family Medicine) Galvin Proffer, [place] as Consulting Physician (Optometry) Brunetta Genera, MD as Consulting Physician (Hematology)  CHIEF COMPLAINTS/PURPOSE OF CONSULTATION:  Chronic Lymphocytic Leukemia  HISTORY OF PRESENTING ILLNESS:   Erica Carlson is a wonderful 74 y.o. female who has been referred to [place] by my colleague Dr. 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 The pt reports that she is doing well overall.   The pt reports that her SLL was initially picked up tthrough a mammogram in 2017. She notes that her CLL/SLL was stable until December 2019. She notes that she began having feelings of indigestion when she eats. She denies issues with acid reflux. The pt notes that her indigestion began making it difficult to eat, but also endorses a weakened appetite. She lost about 25 pounds, but has gained back 5 pounds. The pt had an unrevealing endoscopy on 07/18/18. She notes that she has some difficulty eating meats. She is staying well hydrated. She has experimented with eating different foods. She endorses having a "lot of gas," and puts a teaspoon of her Mylanta in her coffee every morning.  The pt denies any fevers, chills, or night sweats. She has noticed some lumps in her bilateral neck and armpits. She denies other abdominal pains as such. She denies problems passing urine, nor increased urinary frequency. She denies CP or SOB. She denies leg swelling.   Denies liver, kidney, heart, or lung problems and endorses being generally pretty healthy.  Of note prior to the patient's visit today, pt has had a PET/CT completed on 08/15/18 with results revealing Bulky lymphadenopathy in the neck, chest, abdomen, and pelvis demonstrating low level hypermetabolism throughout. 9 mm right thyroid nodule is hypermetabolic.  Thyroid ultrasound recommended to further evaluate. Mucosal thickening with air-fluid level in the left maxillary sinus. 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  Most recent lab results (09/22/18) of CBC w/diff and BMP is as follows: all values are WNL except for WBC at 16.8k, HGB at 10.9, MCHC at 29.7, ANC at 1.2k, Lymphs abs at 11.3k, Monocytes abs at 4.1k, Creatinine at 1.06.  On review of systems, pt reports frequent indigestion, weakened appetite, weight loss, weaker energy levels, bilateral neck bumps, bilateral armpit bumps, and denies CP, SOB, abdominal pain, leg swelling, fevers, chills, night sweats, lower abdominal pains, changes in bowel habits, and any other symptoms.  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 The patient's last visit with [place] was on 01/28/2020. The pt reports that she is doing well overall.  The pt reports that the ulcer on her bottom has healed. She is experiencing some new back soreness that she feels may be connected to her recent weight gain. She denies any current Shingles rash.  Of note since the patient's last visit, pt has had CT C/A/P ([id number]) ([id number]) completed on 03/23/2020 with results revealing "1. Significant positive response to therapy. Bilateral axillary, mediastinal, bilateral hilar, retroperitoneal, central mesenteric and bilateral pelvic lymphadenopathy has substantially decreased. Pulmonary nodules have resolved. No new or progressive disease. 2. Aortic Atherosclerosis (ICD10-I70.0)."  Lab results today (03/23/20) of CBC w/diff and CMP is as follows: all values are WNL except for Glucose at 65, Creatinine at 1.14,  GFR Est Afr Am at 55. 03/23/2020 LDH at 298  On review of systems, pt reports back soreness and denies diarrhea, unexpected weight loss, loss of appetite, fevers,  chills, night sweats and any other symptoms.    MEDICAL HISTORY:  Past Medical History:  Diagnosis Date  . Allergy    spring  . Arthritis    osteo arthritis of right knee  . Cataract    forming  . Hyperlipidemia   . Hypertension   . Lymphoma, small-cell ([place])    right axillary   . Pneumonia     SURGICAL HISTORY: Past Surgical History:  Procedure Laterality Date  . ABDOMINAL HYSTERECTOMY    . AXILLARY LYMPH NODE BIOPSY Right 02/07/2016  . back injections    . BREAST SURGERY    . COLONOSCOPY  2009  . EYE SURGERY     retinal tear repair  . KNEE SURGERY     torn ALC per pt    SOCIAL HISTORY: Social History   Socioeconomic History  . Marital status: Widowed    Spouse name: n/a  . Number of children: 6  . Years of education: 29  . Highest education level: Not on file  Occupational History  . 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 Smoking status: Never Smoker  . Smokeless tobacco: Never Used  Vaping Use  . Vaping Use: Never used  Substance and Sexual Activity  . Alcohol use: No    Alcohol/week: 0.0 standard drinks  . Drug use: No  . Sexual activity: Never  Other Topics Concern  . Not on file  Social History Narrative   Lives alone.   Her adult children live nearby.   Social Determinants of Health   Financial Resource Strain:   . Difficulty of Paying Living Expenses: Not on file  Food Insecurity:   . Worried About Charity fundraiser in the Last Year: Not on file  . Ran Out of Food in the Last Year: Not on file  Transportation Needs:   . Lack of Transportation (Medical): Not on file  . Lack of Transportation (Non-Medical): Not on file  Physical Activity:   . Days of Exercise per Week: Not on file  . Minutes of Exercise per Session: Not on file  Stress:   . Feeling of Stress : Not on file  Social Connections:   . Frequency of Communication with Friends and Family: Not on file  .  Frequency of Social Gatherings with Friends and Family: Not on file  . Attends Religious Services: Not on file  . Active Member of Clubs or Organizations: Not on file  . Attends Archivist Meetings: Not on file  . Marital Status: Not on file  Intimate Partner Violence:   . Fear of Current or Ex-Partner: Not on file  . Emotionally Abused: Not on file  . Physically Abused: Not on file  . Sexually Abused: Not on file    FAMILY HISTORY: Family History  Problem Relation Age of Onset  . Breast cancer Sister   . Hyperlipidemia Son   . Schizophrenia Mother   . Colon cancer Neg Hx   . Colon polyps Neg Hx   . Esophageal cancer Neg Hx   . Rectal cancer Neg Hx   . Stomach cancer Neg Hx     ALLERGIES:  is allergic to hctz [hydrochlorothiazide] and lipitor [atorvastatin].  MEDICATIONS:  Current Outpatient Medications  Medication  Sig Dispense Refill  . acyclovir (ZOVIRAX) 400 MG tablet Take 1 tablet (400 mg total) by mouth 2 (two) times daily. 60 tablet 2  . amLODipine (NORVASC) 10 MG tablet Take 1 tablet (10 mg total) by mouth daily. 30 tablet 11  . Cholecalciferol (VITAMIN D-3) 5000 units TABS Take 1 tablet by mouth daily.    . collagenase (SANTYL) ointment Apply 1 application topically daily. 30 g 0  . diphenhydrAMINE (BENADRYL) 25 mg capsule Take 25 mg by mouth every 6 (six) hours as needed for itching or allergies.    Marland Kitchen lisinopril (ZESTRIL) 10 MG tablet Take 0.5 tablets (5 mg total) by mouth daily. 15 tablet 11  . Multiple Vitamins-Minerals (PRESERVISION AREDS 2 PO) Take by mouth.    . mupirocin ointment (BACTROBAN) 2 % Apply 1 application topically 2 (two) times daily. 22 g 0  . polyethylene glycol (MIRALAX / GLYCOLAX) 17 g packet Take 17 g by mouth daily.    . rosuvastatin (CRESTOR) 10 MG tablet Take 1 tablet (10 mg total) by mouth daily. Please make overdue appt with Dr. Marlou Porch before anymore refills. 2nd attempt 30 tablet 11  . venetoclax (VENCLEXTA) 100 MG TABS Take 2  tablets by mouth daily. 60 tablet 1   No current facility-administered medications for this visit.    REVIEW OF SYSTEMS:   A 10+ POINT REVIEW OF SYSTEMS WAS OBTAINED including neurology, dermatology, psychiatry, cardiac, respiratory, lymph, extremities, GI, GU, Musculoskeletal, constitutional, breasts, reproductive, HEENT.  All pertinent positives are noted in the HPI.  All others are negative.   PHYSICAL EXAMINATION: ECOG FS:1 - Symptomatic but completely ambulatory  Vitals:   03/23/20 1439  BP: (!) 156/71  Pulse: 77  Resp: 18  Temp: 97.8 F (36.6 C)  SpO2: 100%   Wt Readings from Last 3 Encounters:  03/23/20 166 lb (75.3 kg)  01/28/20 158 lb 11.2 oz (72 kg)  01/05/20 151 lb 8 oz (68.7 kg)   Body mass index is 30.36 kg/m.    GENERAL:alert, in no acute distress and comfortable SKIN: no acute rashes, no significant lesions EYES: conjunctiva are pink and non-injected, sclera anicteric OROPHARYNX: MMM, no exudates, no oropharyngeal erythema or ulceration NECK: supple, no JVD LYMPH:  no palpable lymphadenopathy in the cervical, axillary or inguinal regions LUNGS: clear to auscultation b/l with normal respiratory effort HEART: regular rate & rhythm ABDOMEN:  normoactive bowel sounds , non tender, not distended. No palpable hepatosplenomegaly.  Extremity: trace pedal edema b/l PSYCH: alert & oriented x 3 with fluent speech NEURO: no focal motor/sensory deficits  LABORATORY DATA:  I have reviewed the data as listed  . CBC Latest Ref Rng & Units 03/23/2020 01/28/2020 12/31/2019  WBC 4.0 - 10.5 K/uL 6.2 5.7 3.7(L)  Hemoglobin 12.0 - 15.0 g/dL 12.8 11.6(L) 10.4(L)  Hematocrit 36 - 46 % 40.9 37.9 34.2(L)  Platelets 150 - 400 K/uL 213 201 239    . CMP Latest Ref Rng & Units 03/23/2020 03/23/2020 01/28/2020  Glucose 70 - 99 mg/dL 65(L) - 71  BUN 8 - 23 mg/dL 17 - 24(H)  Creatinine 0.44 - 1.00 mg/dL 1.14(H) 1.10(H) 1.09(H)  Sodium 135 - 145 mmol/L 140 - 141  Potassium 3.5 - 5.1  mmol/L 3.5 - 3.7  Chloride 98 - 111 mmol/L 108 - 107  CO2 22 - 32 mmol/L 25 - 23  Calcium 8.9 - 10.3 mg/dL 9.4 - 10.2  Total Protein 6.5 - 8.1 g/dL 6.8 - 6.8  Total Bilirubin 0.3 - 1.2 mg/dL 0.3 -  0.3  Alkaline Phos 38 - 126 U/L 105 - 88  AST 15 - 41 U/L 20 - 17  ALT 0 - 44 U/L 20 - 14    09/22/18 Right Axillary LN Biopsy:    07/31/18 Molecular Pathology:     02/21/16 Biopsy:    RADIOGRAPHIC STUDIES: I have personally reviewed the radiological images as listed and agreed with the findings in the report. CT Chest W Contrast  Result Date: 03/23/2020 CLINICAL DATA:  CLL, diagnosed 3 years prior. Assess response to therapy. EXAM: CT CHEST, ABDOMEN, AND PELVIS WITH CONTRAST TECHNIQUE: Multidetector CT imaging of the chest, abdomen and pelvis was performed following the standard protocol during bolus administration of intravenous contrast. CONTRAST:  150mL OMNIPAQUE IOHEXOL 300 MG/ML  SOLN COMPARISON:  08/12/2019 PET-CT.  07/24/2018 chest CT. FINDINGS: CT CHEST FINDINGS Cardiovascular: Normal heart size. No significant pericardial effusion/thickening. Left anterior descending and right coronary atherosclerosis. Atherosclerotic nonaneurysmal thoracic aorta. Normal caliber pulmonary arteries. No central pulmonary emboli. Mediastinum/Nodes: Subcentimeter hypodense right thyroid nodule is stable. Unremarkable esophagus. Bilateral axillary lymphadenopathy has substantially decreased. Representative short axis diameter 1.5 cm right axillary node (series 2/image 19), previously 3.7 cm. Representative 1.0 cm left axillary node (series 2/image 31), previously 1.8 cm. Previously visualized bulky bilateral mediastinal and bilateral hilar lymphadenopathy has resolved. No new or residual pathologically enlarged mediastinal or hilar lymph nodes. Lungs/Pleura: No pneumothorax. No pleural effusion. No acute consolidative airspace disease, lung masses or significant pulmonary nodules. Previously visualized  bilateral small pulmonary nodules have all resolved. Musculoskeletal: No aggressive appearing focal osseous lesions. Moderate thoracic spondylosis. CT ABDOMEN PELVIS FINDINGS Hepatobiliary: Normal liver with no liver mass. Normal gallbladder with no radiopaque cholelithiasis. No biliary ductal dilatation. Pancreas: Normal, with no mass or duct dilation. Spleen: Normal size. No mass. Adrenals/Urinary Tract: Normal adrenals. Mild rotated right kidney, unchanged. No hydronephrosis. Subcentimeter hypodense upper right renal cortical lesion is too small to characterize and requires no follow-up. Normal bladder. Stomach/Bowel: Normal non-distended stomach. Normal caliber small bowel with no small bowel wall thickening. Normal appendix. Oral contrast transits to the rectum. Normal large bowel with no diverticulosis, large bowel wall thickening or pericolonic fat stranding. Vascular/Lymphatic: Atherosclerotic nonaneurysmal abdominal aorta. Patent portal, splenic, hepatic and renal veins. Previously visualized bulky retroperitoneal, central mesenteric and bilateral pelvic lymphadenopathy has substantially decreased. Representative 1.5 cm precaval node (series 2/image 72), previously 2.0 cm. Representative 1.1 cm left para-aortic node (series 2/image 67), previously 4.5 cm using similar measurement technique. No residual pathologically enlarged mesenteric nodes. Representative 2.1 cm right pelvic sidewall node (series 2/image 100), previously 7.7 cm. Representative 1.2 cm left external iliac node (series 2/image 95), previously 3.9 cm. No residual pathologically enlarged inguinal lymph nodes. Reproductive: Status post hysterectomy, with no abnormal findings at the vaginal cuff. No adnexal mass. Other: No pneumoperitoneum, ascites or focal fluid collection. Musculoskeletal: No aggressive appearing focal osseous lesions. Marked lumbar spondylosis. IMPRESSION: 1. Significant positive response to therapy. Bilateral axillary,  mediastinal, bilateral hilar, retroperitoneal, central mesenteric and bilateral pelvic lymphadenopathy has substantially decreased. Pulmonary nodules have resolved. No new or progressive disease. 2. Aortic Atherosclerosis (ICD10-I70.0). Electronically Signed   By: Ilona Sorrel M.D.   On: 03/23/2020 14:00   CT Abdomen Pelvis W Contrast  Result Date: 03/23/2020 CLINICAL DATA:  CLL, diagnosed 3 years prior. Assess response to therapy. EXAM: CT CHEST, ABDOMEN, AND PELVIS WITH CONTRAST TECHNIQUE: Multidetector CT imaging of the chest, abdomen and pelvis was performed following the standard protocol during bolus administration of intravenous contrast. CONTRAST:  152mL  OMNIPAQUE IOHEXOL 300 MG/ML  SOLN COMPARISON:  08/12/2019 PET-CT.  07/24/2018 chest CT. FINDINGS: CT CHEST FINDINGS Cardiovascular: Normal heart size. No significant pericardial effusion/thickening. Left anterior descending and right coronary atherosclerosis. Atherosclerotic nonaneurysmal thoracic aorta. Normal caliber pulmonary arteries. No central pulmonary emboli. Mediastinum/Nodes: Subcentimeter hypodense right thyroid nodule is stable. Unremarkable esophagus. Bilateral axillary lymphadenopathy has substantially decreased. Representative short axis diameter 1.5 cm right axillary node (series 2/image 19), previously 3.7 cm. Representative 1.0 cm left axillary node (series 2/image 31), previously 1.8 cm. Previously visualized bulky bilateral mediastinal and bilateral hilar lymphadenopathy has resolved. No new or residual pathologically enlarged mediastinal or hilar lymph nodes. Lungs/Pleura: No pneumothorax. No pleural effusion. No acute consolidative airspace disease, lung masses or significant pulmonary nodules. Previously visualized bilateral small pulmonary nodules have all resolved. Musculoskeletal: No aggressive appearing focal osseous lesions. Moderate thoracic spondylosis. CT ABDOMEN PELVIS FINDINGS Hepatobiliary: Normal liver with no liver  mass. Normal gallbladder with no radiopaque cholelithiasis. No biliary ductal dilatation. Pancreas: Normal, with no mass or duct dilation. Spleen: Normal size. No mass. Adrenals/Urinary Tract: Normal adrenals. Mild rotated right kidney, unchanged. No hydronephrosis. Subcentimeter hypodense upper right renal cortical lesion is too small to characterize and requires no follow-up. Normal bladder. Stomach/Bowel: Normal non-distended stomach. Normal caliber small bowel with no small bowel wall thickening. Normal appendix. Oral contrast transits to the rectum. Normal large bowel with no diverticulosis, large bowel wall thickening or pericolonic fat stranding. Vascular/Lymphatic: Atherosclerotic nonaneurysmal abdominal aorta. Patent portal, splenic, hepatic and renal veins. Previously visualized bulky retroperitoneal, central mesenteric and bilateral pelvic lymphadenopathy has substantially decreased. Representative 1.5 cm precaval node (series 2/image 72), previously 2.0 cm. Representative 1.1 cm left para-aortic node (series 2/image 67), previously 4.5 cm using similar measurement technique. No residual pathologically enlarged mesenteric nodes. Representative 2.1 cm right pelvic sidewall node (series 2/image 100), previously 7.7 cm. Representative 1.2 cm left external iliac node (series 2/image 95), previously 3.9 cm. No residual pathologically enlarged inguinal lymph nodes. Reproductive: Status post hysterectomy, with no abnormal findings at the vaginal cuff. No adnexal mass. Other: No pneumoperitoneum, ascites or focal fluid collection. Musculoskeletal: No aggressive appearing focal osseous lesions. Marked lumbar spondylosis. IMPRESSION: 1. Significant positive response to therapy. Bilateral axillary, mediastinal, bilateral hilar, retroperitoneal, central mesenteric and bilateral pelvic lymphadenopathy has substantially decreased. Pulmonary nodules have resolved. No new or progressive disease. 2. Aortic Atherosclerosis  (ICD10-I70.0). Electronically Signed   By: Ilona Sorrel M.D.   On: 03/23/2020 14:00    ASSESSMENT & PLAN:   74 y.o. female with  1. Chronic Lymphocytic Leukemia  02/21/16 Right Axillary LN biopsy revealed SLL, the initial diagnosis. 07/31/18 FISH CLL Prognostic Panel revealed:52.0% cells with 11q deletion Labs upon initial presentation from 09/22/18, WBC at 16.8k, HGB at 10.9, PLT at 272k. ANC at 1.2k, Lymphs at 11.3k, Monocytes at 4.1k. 09/22/18 Right Axillary LN Biopsy revealed SLL, ruling out a transformation event  08/15/18 PET/CT revealed Bulky lymphadenopathy in the neck, chest, abdomen, and pelvis demonstrating low level hypermetabolism throughout. 9 mm right thyroid nodule is hypermetabolic. Thyroid ultrasound recommended to further evaluate. Mucosal thickening with air-fluid level in the left maxillary sinus. 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  08/12/2019 PET/CT scan ([id number]) revealed disease progression in neck, chest, abdomen, pelvis and interval development of pulmonary lesions  2. At risk for tumor lysis syndrome -- no evidence of uncontrolled TLS at this time.  PLAN: -Discussed pt labwork today, 03/23/20; neutropenia is resolved, LDH is elevated -Discussed 03/23/2020 CT C/A/P ([id number]) ([id number]) which revealed. "1.  Significant positive response to therapy. No new or progressive disease." -Will hold C6 Gazyva to prevent recurrent facial Shingles infection.  -Will increase dosage of Venetoclax to 200 mg per day. No prohibitive toxicities. Will repeat labs in 1 month for monitoring.  -Advised pt that Lymphoma can cause weight loss, so when it's treated effectively individuals can see some weight gain.  -Recommend pt wear a back brace and walk 20-30 minutes per day.  -Discussed CDC guidelines regarding the COVID19 booster. Will give in clinic today.  -Recommend pt receive her flu & Shingles vaccines with her PCP. Recommend pt wait two weeks between  vaccinations if possible.  -Recommend pt continue Acyclovir until she receives the Shingrix vaccine. -Will see back in 2 months with labs    FOLLOW UP: Covid vaccine 3rd booster dose today Labs in 4 weeks RTC with [name] with labs in 8 weeks   The total time spent in the appt was 20 minutes and more than 50% was on counseling and direct patient cares.  All of the patient's questions were answered with apparent satisfaction. The patient knows to call the clinic with any problems, questions or concerns.   Sullivan Lone MD [place] AAHIVMS [facility] Hematology/Oncology Physician [facility]  (Office):       [phone] (Work cell):  [phone] (Fax):           [phone]  03/23/2020 4:40 PM  I, Yevette Edwards, am acting as a scribe for Dr. Sullivan Lone.   .I have reviewed the above documentation for accuracy and completeness, and I agree with the above. Brunetta Genera MD

## 2020-03-24 ENCOUNTER — Inpatient Hospital Stay: Payer: Medicare Other

## 2020-03-24 ENCOUNTER — Other Ambulatory Visit: Payer: Self-pay

## 2020-03-24 DIAGNOSIS — Z23 Encounter for immunization: Secondary | ICD-10-CM

## 2020-03-24 NOTE — Progress Notes (Signed)
   Covid-19 Vaccination Clinic  Name:  KARMAH POTOCKI    MRN: [id number] DOB: Nov 17, 1945  03/24/2020  Ms. Pica was observed post Covid-19 immunization for 15 minutes without incident. She was provided with Vaccine Information Sheet and instruction to access the V-Safe system.   Ms. Manfre was instructed to call 911 with any severe reactions post vaccine: Marland Kitchen Difficulty breathing  . Swelling of face and throat  . A fast heartbeat  . A bad rash all over body  . Dizziness and weakness

## 2020-04-07 MED FILL — VENCLEXTA 100 MG TABS: 100 | 30 days supply | Qty: 60 | Fill #0

## 2020-04-19 ENCOUNTER — Other Ambulatory Visit: Payer: Self-pay | Admitting: *Deleted

## 2020-04-19 DIAGNOSIS — C9110 Chronic lymphocytic leukemia of B-cell type not having achieved remission: Secondary | ICD-10-CM

## 2020-04-20 ENCOUNTER — Other Ambulatory Visit: Payer: Self-pay

## 2020-04-20 ENCOUNTER — Inpatient Hospital Stay: Payer: Medicare Other | Attending: Hematology

## 2020-04-20 DIAGNOSIS — C9110 Chronic lymphocytic leukemia of B-cell type not having achieved remission: Secondary | ICD-10-CM | POA: Insufficient documentation

## 2020-04-20 LAB — CMP (CANCER CENTER ONLY)
ALT: 17 U/L (ref 0–44)
AST: 16 U/L (ref 15–41)
Albumin: 3.8 g/dL (ref 3.5–5.0)
Alkaline Phosphatase: 88 U/L (ref 38–126)
Anion gap: 5 (ref 5–15)
BUN: 23 mg/dL (ref 8–23)
CO2: 30 mmol/L (ref 22–32)
Calcium: 9.8 mg/dL (ref 8.9–10.3)
Chloride: 106 mmol/L (ref 98–111)
Creatinine: 1.07 mg/dL — ABNORMAL HIGH (ref 0.44–1.00)
GFR, Estimated: 51 mL/min — ABNORMAL LOW (ref >60)
Glucose, Bld: 86 mg/dL (ref 70–99)
Potassium: 4.3 mmol/L (ref 3.5–5.1)
Sodium: 141 mmol/L (ref 135–145)
Total Bilirubin: 0.4 mg/dL (ref 0.3–1.2)
Total Protein: 6.6 g/dL (ref 6.5–8.1)

## 2020-04-20 LAB — CBC WITH DIFFERENTIAL (CANCER CENTER ONLY)
Abs Immature Granulocytes: 0.01 10*3/uL (ref 0.00–0.07)
Basophils Absolute: 0 10*3/uL (ref 0.0–0.1)
Basophils Relative: 1 %
Eosinophils Absolute: 0.1 10*3/uL (ref 0.0–0.5)
Eosinophils Relative: 1 %
HCT: 39.8 % (ref 36.0–46.0)
Hemoglobin: 12.2 g/dL (ref 12.0–15.0)
Immature Granulocytes: 0 %
Lymphocytes Relative: 68 %
Lymphs Abs: 2.4 10*3/uL (ref 0.7–4.0)
MCH: 25.7 pg — ABNORMAL LOW (ref 26.0–34.0)
MCHC: 30.7 g/dL (ref 30.0–36.0)
MCV: 83.8 fL (ref 80.0–100.0)
Monocytes Absolute: 0.3 10*3/uL (ref 0.1–1.0)
Monocytes Relative: 9 %
Neutro Abs: 0.8 10*3/uL — ABNORMAL LOW (ref 1.7–7.7)
Neutrophils Relative %: 21 %
Platelet Count: 187 10*3/uL (ref 150–400)
RBC: 4.75 MIL/uL (ref 3.87–5.11)
RDW: 14.8 % (ref 11.5–15.5)
WBC Count: 3.6 10*3/uL — ABNORMAL LOW (ref 4.0–10.5)
nRBC: 0 % (ref 0.0–0.2)

## 2020-05-04 MED FILL — VENCLEXTA 100 MG TABS: 100 | 30 days supply | Qty: 60 | Fill #1

## 2020-05-17 ENCOUNTER — Other Ambulatory Visit: Payer: Self-pay

## 2020-05-17 DIAGNOSIS — C9110 Chronic lymphocytic leukemia of B-cell type not having achieved remission: Secondary | ICD-10-CM

## 2020-05-17 NOTE — Progress Notes (Signed)
HEMATOLOGY/ONCOLOGY CLINIC NOTE  Date of Service: 05/18/2020  Patient Care Team: Tower, Wynelle Fanny, MD as PCP - General (Family Medicine) Galvin Proffer, [place] as Consulting Physician (Optometry) Brunetta Genera, MD as Consulting Physician (Hematology)  CHIEF COMPLAINTS/PURPOSE OF CONSULTATION:  Chronic Lymphocytic Leukemia  HISTORY OF PRESENTING ILLNESS:   Erica Carlson is a wonderful 74 y.o. female who has been referred to [place] by my colleague Dr. 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 The pt reports that she is doing well overall.   The pt reports that her SLL was initially picked up tthrough a mammogram in 2017. She notes that her CLL/SLL was stable until December 2019. She notes that she began having feelings of indigestion when she eats. She denies issues with acid reflux. The pt notes that her indigestion began making it difficult to eat, but also endorses a weakened appetite. She lost about 25 pounds, but has gained back 5 pounds. The pt had an unrevealing endoscopy on 07/18/18. She notes that she has some difficulty eating meats. She is staying well hydrated. She has experimented with eating different foods. She endorses having a "lot of gas," and puts a teaspoon of her Mylanta in her coffee every morning.  The pt denies any fevers, chills, or night sweats. She has noticed some lumps in her bilateral neck and armpits. She denies other abdominal pains as such. She denies problems passing urine, nor increased urinary frequency. She denies CP or SOB. She denies leg swelling.   Denies liver, kidney, heart, or lung problems and endorses being generally pretty healthy.  Of note prior to the patient's visit today, pt has had a PET/CT completed on 08/15/18 with results revealing Bulky lymphadenopathy in the neck, chest, abdomen, and pelvis demonstrating low level hypermetabolism throughout. 9 mm right thyroid nodule is hypermetabolic.  Thyroid ultrasound recommended to further evaluate. Mucosal thickening with air-fluid level in the left maxillary sinus. 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  Most recent lab results (09/22/18) of CBC w/diff and BMP is as follows: all values are WNL except for WBC at 16.8k, HGB at 10.9, MCHC at 29.7, ANC at 1.2k, Lymphs abs at 11.3k, Monocytes abs at 4.1k, Creatinine at 1.06.  On review of systems, pt reports frequent indigestion, weakened appetite, weight loss, weaker energy levels, bilateral neck bumps, bilateral armpit bumps, and denies CP, SOB, abdominal pain, leg swelling, fevers, chills, night sweats, lower abdominal pains, changes in bowel habits, and any other symptoms.  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 The patient's last visit with [place] was on 03/23/2020. The pt reports that she is doing well overall.  The pt reports that she is having difficulty sleeping well through the night. She wakes up to urinate occasionally and is bothered by back pain through the night. She also endorses nausea and bloating. Pt denies any constipation. Pt has noticeable foot swelling that is improved with rest. The wound on her buttock continues to heal. She continues taking Acyclovir as she has not been able to receive the Shingles vaccine with her PCP yet. She is taking 5000 IU Vitamin D daily.   Lab results today (05/18/20) of CBC w/diff and CMP is as follows: all values are WNL except for Abs Immature Granulocytes at 0.09K, Creatinine at 1.09, GFR Est at 54.  On review of systems, pt reports nausea, bloating, back pain, sleeplessness, tingling in fingers, foot swelling and denies constipation, anxiety, rash and any other symptoms.    MEDICAL HISTORY:  Past Medical History:  Diagnosis Date  . Allergy    spring  . Arthritis    osteo  arthritis of right knee  . Cataract    forming  . Hyperlipidemia   . Hypertension   . 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 Pneumonia     SURGICAL HISTORY: Past Surgical History:  Procedure Laterality Date  . ABDOMINAL HYSTERECTOMY    . AXILLARY LYMPH NODE BIOPSY Right 02/07/2016  . back injections    . BREAST SURGERY    . COLONOSCOPY  2009  . EYE SURGERY     retinal tear repair  . KNEE SURGERY     torn ALC per pt    SOCIAL HISTORY: Social History   Socioeconomic History  . Marital status: Widowed    Spouse name: n/a  . Number of children: 6  . Years of education: 59  . Highest education level: Not on file  Occupational History  . 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 Smoking status: Never Smoker  . Smokeless tobacco: Never Used  Vaping Use  . Vaping Use: Never used  Substance and Sexual Activity  . Alcohol use: No    Alcohol/week: 0.0 standard drinks  . Drug use: No  . Sexual activity: Never  Other Topics Concern  . Not on file  Social History Narrative   Lives alone.   Her adult children live nearby.   Social Determinants of Health   Financial Resource Strain:   . Difficulty of Paying Living Expenses: Not on file  Food Insecurity:   . Worried About Charity fundraiser in the Last Year: Not on file  . Ran Out of Food in the Last Year: Not on file  Transportation Needs:   . Lack of Transportation (Medical): Not on file  . Lack of Transportation (Non-Medical): Not on file  Physical Activity:   . Days of Exercise per Week: Not on file  . Minutes of Exercise per Session: Not on file  Stress:   . Feeling of Stress : Not on file  Social Connections:   . Frequency of Communication with Friends and Family: Not on file  . Frequency of Social Gatherings with Friends and Family: Not on file  . Attends Religious Services: Not on file  . Active Member of Clubs or Organizations:  Not on file  . Attends Archivist Meetings: Not on file  . Marital Status: Not on file  Intimate Partner Violence:   . Fear of Current or Ex-Partner: Not on file  . Emotionally Abused: Not on file  . Physically Abused: Not on file  . Sexually Abused: Not on file    FAMILY HISTORY: Family History  Problem Relation Age of Onset  . Breast cancer Sister   . Hyperlipidemia Son   . Schizophrenia Mother   . Colon cancer Neg Hx   . Colon polyps Neg Hx   . Esophageal cancer Neg Hx   . Rectal cancer Neg Hx   . Stomach cancer Neg Hx     ALLERGIES:  is allergic to hctz [hydrochlorothiazide] and lipitor [atorvastatin].  MEDICATIONS:  Current Outpatient Medications  Medication Sig Dispense Refill  . acyclovir (ZOVIRAX) 400 MG tablet Take  1 tablet (400 mg total) by mouth 2 (two) times daily. 60 tablet 2  . amLODipine (NORVASC) 10 MG tablet Take 1 tablet (10 mg total) by mouth daily. 30 tablet 11  . Cholecalciferol (VITAMIN D-3) 5000 units TABS Take 1 tablet by mouth daily.    . collagenase (SANTYL) ointment Apply 1 application topically daily. 30 g 0  . diphenhydrAMINE (BENADRYL) 25 mg capsule Take 25 mg by mouth every 6 (six) hours as needed for itching or allergies.    Marland Kitchen lisinopril (ZESTRIL) 10 MG tablet Take 0.5 tablets (5 mg total) by mouth daily. 15 tablet 11  . Multiple Vitamins-Minerals (PRESERVISION AREDS 2 PO) Take by mouth.    . mupirocin ointment (BACTROBAN) 2 % Apply 1 application topically 2 (two) times daily. 22 g 0  . polyethylene glycol (MIRALAX / GLYCOLAX) 17 g packet Take 17 g by mouth daily.    . rosuvastatin (CRESTOR) 10 MG tablet Take 1 tablet (10 mg total) by mouth daily. Please make overdue appt with Dr. Marlou Porch before anymore refills. 2nd attempt 30 tablet 11  . venetoclax (VENCLEXTA) 100 MG TABS Take 2 tablets by mouth daily. 60 tablet 1   No current facility-administered medications for this visit.    REVIEW OF SYSTEMS:   A 10+ POINT REVIEW OF SYSTEMS  WAS OBTAINED including neurology, dermatology, psychiatry, cardiac, respiratory, lymph, extremities, GI, GU, Musculoskeletal, constitutional, breasts, reproductive, HEENT.  All pertinent positives are noted in the HPI.  All others are negative.   PHYSICAL EXAMINATION: ECOG FS:1 - Symptomatic but completely ambulatory  Vitals:   05/18/20 1055  BP: (!) 163/86  Pulse: 78  Resp: 18  Temp: (!) 97.1 F (36.2 C)  SpO2: 99%   Wt Readings from Last 3 Encounters:  05/18/20 167 lb 1.6 oz (75.8 kg)  03/23/20 166 lb (75.3 kg)  01/28/20 158 lb 11.2 oz (72 kg)   Body mass index is 30.56 kg/m.    GENERAL:alert, in no acute distress and comfortable SKIN: no acute rashes, no significant lesions EYES: conjunctiva are pink and non-injected, sclera anicteric OROPHARYNX: MMM, no exudates, no oropharyngeal erythema or ulceration NECK: supple, no JVD LYMPH:  no palpable lymphadenopathy in the cervical, axillary or inguinal regions LUNGS: clear to auscultation b/l with normal respiratory effort HEART: regular rate & rhythm ABDOMEN:  normoactive bowel sounds , non tender, not distended. No palpable hepatosplenomegaly.  Extremity: no pedal edema PSYCH: alert & oriented x 3 with fluent speech NEURO: no focal motor/sensory deficits  LABORATORY DATA:  I have reviewed the data as listed  . CBC Latest Ref Rng & Units 05/18/2020 04/20/2020 03/23/2020  WBC 4.0 - 10.5 K/uL 5.3 3.6(L) 6.2  Hemoglobin 12.0 - 15.0 g/dL 12.5 12.2 12.8  Hematocrit 36 - 46 % 40.1 39.8 40.9  Platelets 150 - 400 K/uL 270 187 213    . CMP Latest Ref Rng & Units 05/18/2020 04/20/2020 03/23/2020  Glucose 70 - 99 mg/dL 90 86 65(L)  BUN 8 - 23 mg/dL 21 23 17   Creatinine 0.44 - 1.00 mg/dL 1.09(H) 1.07(H) 1.14(H)  Sodium 135 - 145 mmol/L 141 141 140  Potassium 3.5 - 5.1 mmol/L 4.0 4.3 3.5  Chloride 98 - 111 mmol/L 105 106 108  CO2 22 - 32 mmol/L 28 30 25   Calcium 8.9 - 10.3 mg/dL 9.9 9.8 9.4  Total Protein 6.5 - 8.1 g/dL 7.3 6.6  6.8  Total Bilirubin 0.3 - 1.2 mg/dL 0.4 0.4 0.3  Alkaline Phos 38 - 126 U/L 89 88  105  AST 15 - 41 U/L 19 16 20   ALT 0 - 44 U/L 19 17 20     09/22/18 Right Axillary LN Biopsy:    07/31/18 Molecular Pathology:     02/21/16 Biopsy:    RADIOGRAPHIC STUDIES: I have personally reviewed the radiological images as listed and agreed with the findings in the report. No results found.  ASSESSMENT & PLAN:   74 y.o. female with  1. Chronic Lymphocytic Leukemia  02/21/16 Right Axillary LN biopsy revealed SLL, the initial diagnosis. 07/31/18 FISH CLL Prognostic Panel revealed:52.0% cells with 11q deletion Labs upon initial presentation from 09/22/18, WBC at 16.8k, HGB at 10.9, PLT at 272k. ANC at 1.2k, Lymphs at 11.3k, Monocytes at 4.1k. 09/22/18 Right Axillary LN Biopsy revealed SLL, ruling out a transformation event  08/15/18 PET/CT revealed Bulky lymphadenopathy in the neck, chest, abdomen, and pelvis demonstrating low level hypermetabolism throughout. 9 mm right thyroid nodule is hypermetabolic. Thyroid ultrasound recommended to further evaluate. Mucosal thickening with air-fluid level in the left maxillary sinus. 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  08/12/2019 PET/CT scan ([id number]) revealed disease progression in neck, chest, abdomen, pelvis and interval development of pulmonary lesions  03/23/2020 CT C/A/P ([id number]) ([id number]) revealed. "1. Significant positive response to therapy. No new or progressive disease."  2. At risk for tumor lysis syndrome -- no evidence of uncontrolled TLS at this time.  PLAN: -Discussed pt labwork today, 05/18/20; blood counts and chemistries look good, neutropenia has resolved. -The pt has no prohibitive toxicities from continuing 200 mg Venetoclax daily at this time. May increase dose at future date.  -Recommended that the pt continue to eat well, drink at least 48-64 oz of water each day, and walk 20-30 minutes each day. It  is also recommended that pt de-stress, eat more fruits and veggies, and cut down on red meats and processed foods.  -Recommend reduction in dietary sodium to improve foot swelling.  -Recommend pt continue Acyclovir until she receives the Shingrix vaccine. -Recommend pt f/u with PCP for Shingles vaccine.  -Will give annual flu vaccine in clinic today.  -Will see back in 2 months with labs   FOLLOW UP: Flu shot today RTC with [name] with labs in 2 months   The total time spent in the appt was 20 minutes and more than 50% was on counseling and direct patient cares.  All of the patient's questions were answered with apparent satisfaction. The patient knows to call the clinic with any problems, questions or concerns.   Sullivan Lone MD [place] AAHIVMS [facility] Hematology/Oncology Physician [facility]  (Office):       [phone] (Work cell):  [phone] (Fax):           [phone]  05/18/2020 11:11 AM  I, Yevette Edwards, am acting as a scribe for Dr. Sullivan Lone.   .I have reviewed the above documentation for accuracy and completeness, and I agree with the above. Brunetta Genera MD

## 2020-05-18 ENCOUNTER — Inpatient Hospital Stay: Payer: Medicare Other | Attending: Hematology | Admitting: Hematology

## 2020-05-18 ENCOUNTER — Inpatient Hospital Stay: Payer: Medicare Other

## 2020-05-18 ENCOUNTER — Other Ambulatory Visit: Payer: Self-pay

## 2020-05-18 VITALS — BP 163/86 | HR 78 | Temp 97.1°F | Resp 18 | Ht 62.0 in | Wt 167.1 lb

## 2020-05-18 DIAGNOSIS — Z23 Encounter for immunization: Secondary | ICD-10-CM | POA: Diagnosis not present

## 2020-05-18 DIAGNOSIS — C9110 Chronic lymphocytic leukemia of B-cell type not having achieved remission: Secondary | ICD-10-CM | POA: Diagnosis present

## 2020-05-18 LAB — CBC WITH DIFFERENTIAL (CANCER CENTER ONLY)
Abs Immature Granulocytes: 0.09 10*3/uL — ABNORMAL HIGH (ref 0.00–0.07)
Basophils Absolute: 0 10*3/uL (ref 0.0–0.1)
Basophils Relative: 0 %
Eosinophils Absolute: 0 10*3/uL (ref 0.0–0.5)
Eosinophils Relative: 1 %
HCT: 40.1 % (ref 36.0–46.0)
Hemoglobin: 12.5 g/dL (ref 12.0–15.0)
Immature Granulocytes: 2 %
Lymphocytes Relative: 52 %
Lymphs Abs: 2.8 10*3/uL (ref 0.7–4.0)
MCH: 26.6 pg (ref 26.0–34.0)
MCHC: 31.2 g/dL (ref 30.0–36.0)
MCV: 85.3 fL (ref 80.0–100.0)
Monocytes Absolute: 0.5 10*3/uL (ref 0.1–1.0)
Monocytes Relative: 9 %
Neutro Abs: 1.9 10*3/uL (ref 1.7–7.7)
Neutrophils Relative %: 36 %
Platelet Count: 270 10*3/uL (ref 150–400)
RBC: 4.7 MIL/uL (ref 3.87–5.11)
RDW: 14.7 % (ref 11.5–15.5)
WBC Count: 5.3 10*3/uL (ref 4.0–10.5)
nRBC: 0 % (ref 0.0–0.2)

## 2020-05-18 LAB — CMP (CANCER CENTER ONLY)
ALT: 19 U/L (ref 0–44)
AST: 19 U/L (ref 15–41)
Albumin: 4.1 g/dL (ref 3.5–5.0)
Alkaline Phosphatase: 89 U/L (ref 38–126)
Anion gap: 8 (ref 5–15)
BUN: 21 mg/dL (ref 8–23)
CO2: 28 mmol/L (ref 22–32)
Calcium: 9.9 mg/dL (ref 8.9–10.3)
Chloride: 105 mmol/L (ref 98–111)
Creatinine: 1.09 mg/dL — ABNORMAL HIGH (ref 0.44–1.00)
GFR, Estimated: 54 mL/min — ABNORMAL LOW (ref >60)
Glucose, Bld: 90 mg/dL (ref 70–99)
Potassium: 4 mmol/L (ref 3.5–5.1)
Sodium: 141 mmol/L (ref 135–145)
Total Bilirubin: 0.4 mg/dL (ref 0.3–1.2)
Total Protein: 7.3 g/dL (ref 6.5–8.1)

## 2020-05-18 MED ORDER — INFLUENZA VAC A&B SA ADJ QUAD 0.5 ML IM PRSY
0.5000 mL | PREFILLED_SYRINGE | Freq: Once | INTRAMUSCULAR | Status: AC
  Administered 2020-05-18: 0.5 mL via INTRAMUSCULAR

## 2020-05-18 MED ORDER — INFLUENZA VAC A&B SA ADJ QUAD 0.5 ML IM PRSY
PREFILLED_SYRINGE | INTRAMUSCULAR | Status: AC
  Filled 2020-05-18: qty 0.5

## 2020-06-03 ENCOUNTER — Telehealth: Payer: Self-pay | Admitting: *Deleted

## 2020-06-03 ENCOUNTER — Other Ambulatory Visit: Payer: Self-pay | Admitting: Hematology

## 2020-06-03 NOTE — Telephone Encounter (Signed)
Opened in error

## 2020-06-08 MED FILL — VENCLEXTA 100 MG TABS: 100 | 30 days supply | Qty: 60 | Fill #0

## 2020-07-06 MED FILL — VENCLEXTA 100 MG TABS: 100 | 30 days supply | Qty: 60 | Fill #1

## 2020-07-14 ENCOUNTER — Telehealth: Payer: Self-pay | Admitting: Hematology

## 2020-07-14 NOTE — Telephone Encounter (Signed)
Called patient regarding upcoming scheduled appointment, patient is notified.

## 2020-07-22 ENCOUNTER — Other Ambulatory Visit: Payer: Self-pay

## 2020-07-22 ENCOUNTER — Inpatient Hospital Stay: Payer: Medicare PPO | Attending: Hematology

## 2020-07-22 ENCOUNTER — Inpatient Hospital Stay (HOSPITAL_BASED_OUTPATIENT_CLINIC_OR_DEPARTMENT_OTHER): Payer: Medicare PPO | Admitting: Hematology

## 2020-07-22 ENCOUNTER — Other Ambulatory Visit: Payer: Self-pay | Admitting: Hematology

## 2020-07-22 VITALS — BP 159/86 | HR 63 | Temp 97.1°F | Resp 17 | Ht 62.0 in | Wt 166.8 lb

## 2020-07-22 DIAGNOSIS — C9110 Chronic lymphocytic leukemia of B-cell type not having achieved remission: Secondary | ICD-10-CM

## 2020-07-22 LAB — CBC WITH DIFFERENTIAL/PLATELET
Abs Immature Granulocytes: 0.05 10*3/uL (ref 0.00–0.07)
Basophils Absolute: 0 10*3/uL (ref 0.0–0.1)
Basophils Relative: 0 %
Eosinophils Absolute: 0 10*3/uL (ref 0.0–0.5)
Eosinophils Relative: 0 %
HCT: 39.2 % (ref 36.0–46.0)
Hemoglobin: 12.7 g/dL (ref 12.0–15.0)
Immature Granulocytes: 1 %
Lymphocytes Relative: 59 %
Lymphs Abs: 2.8 10*3/uL (ref 0.7–4.0)
MCH: 28 pg (ref 26.0–34.0)
MCHC: 32.4 g/dL (ref 30.0–36.0)
MCV: 86.5 fL (ref 80.0–100.0)
Monocytes Absolute: 0.4 10*3/uL (ref 0.1–1.0)
Monocytes Relative: 9 %
Neutro Abs: 1.5 10*3/uL — ABNORMAL LOW (ref 1.7–7.7)
Neutrophils Relative %: 31 %
Platelets: 223 10*3/uL (ref 150–400)
RBC: 4.53 MIL/uL (ref 3.87–5.11)
RDW: 13.9 % (ref 11.5–15.5)
WBC: 4.8 10*3/uL (ref 4.0–10.5)
nRBC: 0 % (ref 0.0–0.2)

## 2020-07-22 LAB — CMP (CANCER CENTER ONLY)
ALT: 19 U/L (ref 0–44)
AST: 21 U/L (ref 15–41)
Albumin: 4.1 g/dL (ref 3.5–5.0)
Alkaline Phosphatase: 86 U/L (ref 38–126)
Anion gap: 8 (ref 5–15)
BUN: 20 mg/dL (ref 8–23)
CO2: 24 mmol/L (ref 22–32)
Calcium: 9.8 mg/dL (ref 8.9–10.3)
Chloride: 107 mmol/L (ref 98–111)
Creatinine: 1.14 mg/dL — ABNORMAL HIGH (ref 0.44–1.00)
GFR, Estimated: 51 mL/min — ABNORMAL LOW (ref >60)
Glucose, Bld: 99 mg/dL (ref 70–99)
Potassium: 4 mmol/L (ref 3.5–5.1)
Sodium: 139 mmol/L (ref 135–145)
Total Bilirubin: 0.5 mg/dL (ref 0.3–1.2)
Total Protein: 7.2 g/dL (ref 6.5–8.1)

## 2020-07-22 LAB — LACTATE DEHYDROGENASE: LDH: 263 U/L — ABNORMAL HIGH (ref 98–192)

## 2020-07-22 MED ORDER — VENETOCLAX 100 MG PO TABS
200.0000 mg | ORAL_TABLET | Freq: Every day | ORAL | 2 refills | Status: DC
Start: 2020-07-22 — End: 2020-07-22

## 2020-07-22 NOTE — Progress Notes (Signed)
HEMATOLOGY/ONCOLOGY CLINIC NOTE  Date of Service: 07/22/2020  Patient Care Team: Tower, Wynelle Fanny, MD as PCP - General (Family Medicine) Galvin Proffer, [place] as Consulting Physician (Optometry) Brunetta Genera, MD as Consulting Physician (Hematology)  CHIEF COMPLAINTS/PURPOSE OF CONSULTATION:  Chronic Lymphocytic Leukemia  HISTORY OF PRESENTING ILLNESS:   Erica Carlson is a wonderful 75 y.o. female who has been referred to [place] by my colleague Dr. 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 The pt reports that she is doing well overall.   The pt reports that her SLL was initially picked up tthrough a mammogram in 2017. She notes that her CLL/SLL was stable until December 2019. She notes that she began having feelings of indigestion when she eats. She denies issues with acid reflux. The pt notes that her indigestion began making it difficult to eat, but also endorses a weakened appetite. She lost about 25 pounds, but has gained back 5 pounds. The pt had an unrevealing endoscopy on 07/18/18. She notes that she has some difficulty eating meats. She is staying well hydrated. She has experimented with eating different foods. She endorses having a "lot of gas," and puts a teaspoon of her Mylanta in her coffee every morning.  The pt denies any fevers, chills, or night sweats. She has noticed some lumps in her bilateral neck and armpits. She denies other abdominal pains as such. She denies problems passing urine, nor increased urinary frequency. She denies CP or SOB. She denies leg swelling.   Denies liver, kidney, heart, or lung problems and endorses being generally pretty healthy.  Of note prior to the patient's visit today, pt has had a PET/CT completed on 08/15/18 with results revealing Bulky lymphadenopathy in the neck, chest, abdomen, and pelvis demonstrating low level hypermetabolism throughout. 9 mm right thyroid nodule is hypermetabolic.  Thyroid ultrasound recommended to further evaluate. Mucosal thickening with air-fluid level in the left maxillary sinus. 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  Most recent lab results (09/22/18) of CBC w/diff and BMP is as follows: all values are WNL except for WBC at 16.8k, HGB at 10.9, MCHC at 29.7, ANC at 1.2k, Lymphs abs at 11.3k, Monocytes abs at 4.1k, Creatinine at 1.06.  On review of systems, pt reports frequent indigestion, weakened appetite, weight loss, weaker energy levels, bilateral neck bumps, bilateral armpit bumps, and denies CP, SOB, abdominal pain, leg swelling, fevers, chills, night sweats, lower abdominal pains, changes in bowel habits, and any other symptoms.  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 The patient's last visit with [place] was on 05/18/2020. The pt reports that she is doing well overall.  The pt reports no acute new concerns.  No fevers/chills/night sweat/rash/un expected weight loss. NO diarrhea.  Tolerating Venetoclax without any issues.  Lab results today (07/22/20) of CBC w/diff and CMP stable.   MEDICAL HISTORY:  Past Medical History:  Diagnosis Date  . Allergy    spring  . Arthritis    osteo arthritis of right knee  . Cataract    forming  . Hyperlipidemia   . Hypertension   . 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 Pneumonia     SURGICAL HISTORY: Past Surgical History:  Procedure Laterality Date  . ABDOMINAL HYSTERECTOMY    . AXILLARY LYMPH NODE  BIOPSY Right 02/07/2016  . back injections    . BREAST SURGERY    . COLONOSCOPY  2009  . EYE SURGERY     retinal tear repair  . KNEE SURGERY     torn ALC per pt    SOCIAL HISTORY: Social History   Socioeconomic History  . Marital status: Widowed    Spouse name: n/a  . Number of children: 6  . Years of  education: 69  . Highest education level: Not on file  Occupational History  . 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 Smoking status: Never Smoker  . Smokeless tobacco: Never Used  Vaping Use  . Vaping Use: Never used  Substance and Sexual Activity  . Alcohol use: No    Alcohol/week: 0.0 standard drinks  . Drug use: No  . Sexual activity: Never  Other Topics Concern  . Not on file  Social History Narrative   Lives alone.   Her adult children live nearby.   Social Determinants of Health   Financial Resource Strain: Not on file  Food Insecurity: Not on file  Transportation Needs: Not on file  Physical Activity: Not on file  Stress: Not on file  Social Connections: Not on file  Intimate Partner Violence: Not on file    FAMILY HISTORY: Family History  Problem Relation Age of Onset  . Breast cancer Sister   . Hyperlipidemia Son   . Schizophrenia Mother   . Colon cancer Neg Hx   . Colon polyps Neg Hx   . Esophageal cancer Neg Hx   . Rectal cancer Neg Hx   . Stomach cancer Neg Hx     ALLERGIES:  is allergic to hctz [hydrochlorothiazide] and lipitor [atorvastatin].  MEDICATIONS:  Current Outpatient Medications  Medication Sig Dispense Refill  . acyclovir (ZOVIRAX) 400 MG tablet Take 1 tablet (400 mg total) by mouth 2 (two) times daily. 60 tablet 2  . amLODipine (NORVASC) 10 MG tablet Take 1 tablet (10 mg total) by mouth daily. 30 tablet 11  . Cholecalciferol (VITAMIN D-3) 5000 units TABS Take 1 tablet by mouth daily.    . collagenase (SANTYL) ointment Apply 1 application topically daily. 30 g 0  . diphenhydrAMINE (BENADRYL) 25 mg capsule Take 25 mg by mouth every 6 (six) hours as needed for itching or allergies.    Marland Kitchen lisinopril (ZESTRIL) 10 MG tablet Take 0.5 tablets (5 mg total) by mouth daily. 15 tablet 11  . Multiple Vitamins-Minerals (PRESERVISION AREDS 2 PO) Take by mouth.    . mupirocin  ointment (BACTROBAN) 2 % Apply 1 application topically 2 (two) times daily. 22 g 0  . polyethylene glycol (MIRALAX / GLYCOLAX) 17 g packet Take 17 g by mouth daily.    . rosuvastatin (CRESTOR) 10 MG tablet Take 1 tablet (10 mg total) by mouth daily. Please make overdue appt with Dr. Marlou Porch before anymore refills. 2nd attempt 30 tablet 11  . VENCLEXTA 100 MG tablet TAKE 2 TABLETS BY MOUTH DAILY. 60 tablet 1   No current facility-administered medications for this visit.    REVIEW OF SYSTEMS:   A 10+ POINT REVIEW OF SYSTEMS WAS OBTAINED including neurology, dermatology, psychiatry, cardiac, respiratory, lymph, extremities, GI, GU, Musculoskeletal, constitutional, breasts, reproductive, HEENT.  All pertinent positives are noted in the HPI.  All others are negative.   PHYSICAL EXAMINATION: ECOG FS:1 - Symptomatic but completely ambulatory  There were no vitals filed  for this visit. Wt Readings from Last 3 Encounters:  05/18/20 167 lb 1.6 oz (75.8 kg)  03/23/20 166 lb (75.3 kg)  01/28/20 158 lb 11.2 oz (72 kg)   There is no height or weight on file to calculate BMI.    NAD GENERAL:alert, in no acute distress and comfortable SKIN: no acute rashes, no significant lesions EYES: conjunctiva are pink and non-injected, sclera anicteric OROPHARYNX: MMM, no exudates, no oropharyngeal erythema or ulceration NECK: supple, no JVD LYMPH:  no palpable lymphadenopathy in the cervical, axillary or inguinal regions LUNGS: clear to auscultation b/l with normal respiratory effort HEART: regular rate & rhythm ABDOMEN:  normoactive bowel sounds , non tender, not distended. No palpable hepatosplenomegaly.  Extremity: no pedal edema PSYCH: alert & oriented x 3 with fluent speech NEURO: no focal motor/sensory deficits  LABORATORY DATA:  I have reviewed the data as listed  . CBC Latest Ref Rng & Units 05/18/2020 04/20/2020 03/23/2020  WBC 4.0 - 10.5 K/uL 5.3 3.6(L) 6.2  Hemoglobin 12.0 - 15.0 g/dL 12.5  12.2 12.8  Hematocrit 36.0 - 46.0 % 40.1 39.8 40.9  Platelets 150 - 400 K/uL 270 187 213    . CMP Latest Ref Rng & Units 05/18/2020 04/20/2020 03/23/2020  Glucose 70 - 99 mg/dL 90 86 65(L)  BUN 8 - 23 mg/dL 21 23 17   Creatinine 0.44 - 1.00 mg/dL 1.09(H) 1.07(H) 1.14(H)  Sodium 135 - 145 mmol/L 141 141 140  Potassium 3.5 - 5.1 mmol/L 4.0 4.3 3.5  Chloride 98 - 111 mmol/L 105 106 108  CO2 22 - 32 mmol/L 28 30 25   Calcium 8.9 - 10.3 mg/dL 9.9 9.8 9.4  Total Protein 6.5 - 8.1 g/dL 7.3 6.6 6.8  Total Bilirubin 0.3 - 1.2 mg/dL 0.4 0.4 0.3  Alkaline Phos 38 - 126 U/L 89 88 105  AST 15 - 41 U/L 19 16 20   ALT 0 - 44 U/L 19 17 20     09/22/18 Right Axillary LN Biopsy:    07/31/18 Molecular Pathology:     02/21/16 Biopsy:    RADIOGRAPHIC STUDIES: I have personally reviewed the radiological images as listed and agreed with the findings in the report. No results found.  ASSESSMENT & PLAN:   75 y.o. female with  1. Chronic Lymphocytic Leukemia  02/21/16 Right Axillary LN biopsy revealed SLL, the initial diagnosis. 07/31/18 FISH CLL Prognostic Panel revealed:52.0% cells with 11q deletion Labs upon initial presentation from 09/22/18, WBC at 16.8k, HGB at 10.9, PLT at 272k. ANC at 1.2k, Lymphs at 11.3k, Monocytes at 4.1k. 09/22/18 Right Axillary LN Biopsy revealed SLL, ruling out a transformation event  08/15/18 PET/CT revealed Bulky lymphadenopathy in the neck, chest, abdomen, and pelvis demonstrating low level hypermetabolism throughout. 9 mm right thyroid nodule is hypermetabolic. Thyroid ultrasound recommended to further evaluate. Mucosal thickening with air-fluid level in the left maxillary sinus. 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  08/12/2019 PET/CT scan ([id number]) revealed disease progression in neck, chest, abdomen, pelvis and interval development of pulmonary lesions  03/23/2020 CT C/A/P ([id number]) ([id number]) revealed. "1. Significant positive  response to therapy. No new or progressive disease."  2. At risk for tumor lysis syndrome -- no evidence of uncontrolled TLS at this time.  PLAN: Labs stable. No clinical or lab evidence of CLL progression at this time -The pt has no prohibitive toxicities from continuing 200 mg Venetoclax daily at this time. -Recommend pt continue Acyclovir    FOLLOW UP: Ct chest/abd/pelvis in 11 weeks RTC with Dr  Artia Singley with labs in 12 weeks      The total time spent in the appt was 20 minutes and more than 50% was on counseling and direct patient cares.  All of the patient's questions were answered with apparent satisfaction. The patient knows to call the clinic with any problems, questions or concerns.   Sullivan Lone MD [place] AAHIVMS [facility] Hematology/Oncology Physician [facility]  (Office):       [phone] (Work cell):  [phone] (Fax):           [phone]  07/22/2020 2:53 AM  I, Yevette Edwards, am acting as a scribe for Dr. Sullivan Lone.   .I have reviewed the above documentation for accuracy and completeness, and I agree with the above. Brunetta Genera MD

## 2020-08-03 MED FILL — VENCLEXTA 100 MG TABS: 100 | 30 days supply | Qty: 60 | Fill #0

## 2020-08-16 ENCOUNTER — Other Ambulatory Visit: Payer: Self-pay | Admitting: Family Medicine

## 2020-08-16 NOTE — Telephone Encounter (Signed)
Pharmacy requests refill on: Amlodipine 10 mg   LAST REFILL: 07/21/2020 (Q-30, R-11) LAST OV: 01/05/2020 NEXT OV: Not Scheduled  PHARMACY: [facility] [place], [place]

## 2020-08-31 ENCOUNTER — Other Ambulatory Visit: Payer: Self-pay | Admitting: Family Medicine

## 2020-08-31 MED FILL — VENCLEXTA 100 MG TABS: 100 | 30 days supply | Qty: 60 | Fill #1

## 2020-09-26 ENCOUNTER — Telehealth: Payer: Self-pay | Admitting: Family Medicine

## 2020-09-26 DIAGNOSIS — I10 Essential (primary) hypertension: Secondary | ICD-10-CM

## 2020-09-26 DIAGNOSIS — E782 Mixed hyperlipidemia: Secondary | ICD-10-CM

## 2020-09-26 NOTE — Telephone Encounter (Signed)
-----   Message from Cloyd Stagers, RT sent at 09/12/2020  2:47 PM EST ----- Regarding: Lab Orders for Tuesday 3.22.2022 Please place lab orders for Tuesday 3.22.2022, office visit for physical on Monday 3.28.2022 Thank you, Dyke Maes RT(R)

## 2020-09-27 ENCOUNTER — Other Ambulatory Visit: Payer: Self-pay

## 2020-09-27 ENCOUNTER — Ambulatory Visit (INDEPENDENT_AMBULATORY_CARE_PROVIDER_SITE_OTHER): Payer: Medicare PPO

## 2020-09-27 ENCOUNTER — Other Ambulatory Visit (INDEPENDENT_AMBULATORY_CARE_PROVIDER_SITE_OTHER): Payer: Medicare PPO

## 2020-09-27 DIAGNOSIS — Z0000 Encounter for general adult medical examination without abnormal findings: Secondary | ICD-10-CM

## 2020-09-27 DIAGNOSIS — E782 Mixed hyperlipidemia: Secondary | ICD-10-CM | POA: Diagnosis not present

## 2020-09-27 DIAGNOSIS — I10 Essential (primary) hypertension: Secondary | ICD-10-CM

## 2020-09-27 LAB — COMPREHENSIVE METABOLIC PANEL
ALT: 21 U/L (ref 0–35)
AST: 20 U/L (ref 0–37)
Albumin: 4.5 g/dL (ref 3.5–5.2)
Alkaline Phosphatase: 84 U/L (ref 39–117)
BUN: 18 mg/dL (ref 6–23)
CO2: 27 mEq/L (ref 19–32)
Calcium: 10 mg/dL (ref 8.4–10.5)
Chloride: 104 mEq/L (ref 96–112)
Creatinine, Ser: 0.99 mg/dL (ref 0.40–1.20)
GFR: 56.37 mL/min — ABNORMAL LOW (ref >60.00)
Glucose, Bld: 96 mg/dL (ref 70–99)
Potassium: 3.9 mEq/L (ref 3.5–5.1)
Sodium: 139 mEq/L (ref 135–145)
Total Bilirubin: 0.4 mg/dL (ref 0.2–1.2)
Total Protein: 6.5 g/dL (ref 6.0–8.3)

## 2020-09-27 LAB — CBC WITH DIFFERENTIAL/PLATELET
Basophils Absolute: 0.1 10*3/uL (ref 0.0–0.1)
Basophils Relative: 1.5 % (ref 0.0–3.0)
Eosinophils Absolute: 0.1 10*3/uL (ref 0.0–0.7)
Eosinophils Relative: 1.6 % (ref 0.0–5.0)
HCT: 38.1 % (ref 36.0–46.0)
Hemoglobin: 12.5 g/dL (ref 12.0–15.0)
Lymphocytes Relative: 56.8 % — ABNORMAL HIGH (ref 12.0–46.0)
Lymphs Abs: 2.7 10*3/uL (ref 0.7–4.0)
MCHC: 32.8 g/dL (ref 30.0–36.0)
MCV: 84.8 fl (ref 78.0–100.0)
Monocytes Absolute: 0.4 10*3/uL (ref 0.1–1.0)
Monocytes Relative: 9 % (ref 3.0–12.0)
Neutro Abs: 1.5 10*3/uL (ref 1.4–7.7)
Neutrophils Relative %: 31.1 % — ABNORMAL LOW (ref 43.0–77.0)
Platelets: 208 10*3/uL (ref 150.0–400.0)
RBC: 4.5 Mil/uL (ref 3.87–5.11)
RDW: 14 % (ref 11.5–15.5)
WBC: 4.8 10*3/uL (ref 4.0–10.5)

## 2020-09-27 LAB — LIPID PANEL
Cholesterol: 373 mg/dL — ABNORMAL HIGH (ref 0–200)
HDL: 58.7 mg/dL (ref >39.00)
NonHDL: 314.75
Total CHOL/HDL Ratio: 6
Triglycerides: 261 mg/dL — ABNORMAL HIGH (ref 0.0–149.0)
VLDL: 52.2 mg/dL — ABNORMAL HIGH (ref 0.0–40.0)

## 2020-09-27 LAB — TSH: TSH: 2.44 u[IU]/mL (ref 0.35–4.50)

## 2020-09-27 LAB — LDL CHOLESTEROL, DIRECT: Direct LDL: 204 mg/dL

## 2020-09-27 NOTE — Patient Instructions (Signed)
[name] , Thank you for taking time to come for your Medicare Wellness Visit. I appreciate your ongoing commitment to your health goals. Please review the following plan we discussed and let me know if I can assist you in the future.   Screening recommendations/referrals: Colonoscopy: Up to date, completed 04/14/2018, due 04/2028 Mammogram: due, will discuss with provider at physical  Bone Density: due, will discuss with provider at physical  Recommended yearly ophthalmology/optometry visit for glaucoma screening and checkup Recommended yearly dental visit for hygiene and checkup  Vaccinations: Influenza vaccine: Up to date, completed 05/18/2020, due 02/2021 Pneumococcal vaccine: Completed series Tdap vaccine: Up to date, completed 08/25/2014, due 08/2024 Shingles vaccine: due, check with your insurance regarding coverage if interested    Covid-19:Completed series  Advanced directives: Please bring a copy of your POA (Power of [place]) and/or Living Will to your next appointment.   Conditions/risks identified: hypertension, hyperlipidemia   Next appointment: Follow up in one year for your annual wellness visit    Preventive Care 74 Years and Older, Female Preventive care refers to lifestyle choices and visits with your health care provider that can promote health and wellness. What does preventive care include?  A yearly physical exam. This is also called an annual well check.  Dental exams once or twice a year.  Routine eye exams. Ask your health care provider how often you should have your eyes checked.  Personal lifestyle choices, including:  Daily care of your teeth and gums.  Regular physical activity.  Eating a healthy diet.  Avoiding tobacco and drug use.  Limiting alcohol use.  Practicing safe sex.  Taking low-dose aspirin every day.  Taking vitamin and mineral supplements as recommended by your health care provider. What happens during an annual well  check? The services and screenings done by your health care provider during your annual well check will depend on your age, overall health, lifestyle risk factors, and family history of disease. Counseling  Your health care provider may ask you questions about your:  Alcohol use.  Tobacco use.  Drug use.  Emotional well-being.  Home and relationship well-being.  Sexual activity.  Eating habits.  History of falls.  Memory and ability to understand (cognition).  Work and work Statistician.  Reproductive health. Screening  You may have the following tests or measurements:  Height, weight, and BMI.  Blood pressure.  Lipid and cholesterol levels. These may be checked every 5 years, or more frequently if you are over 61 years old.  Skin check.  Lung cancer screening. You may have this screening every year starting at age 77 if you have a 30-pack-year history of smoking and currently smoke or have quit within the past 15 years.  Fecal occult blood test (FOBT) of the stool. You may have this test every year starting at age 35.  Flexible sigmoidoscopy or colonoscopy. You may have a sigmoidoscopy every 5 years or a colonoscopy every 10 years starting at age 37.  Hepatitis C blood test.  Hepatitis B blood test.  Sexually transmitted disease (STD) testing.  Diabetes screening. This is done by checking your blood sugar (glucose) after you have not eaten for a while (fasting). You may have this done every 1-3 years.  Bone density scan. This is done to screen for osteoporosis. You may have this done starting at age 56.  Mammogram. This may be done every 1-2 years. Talk to your health care provider about how often you should have regular mammograms. Talk with  your health care provider about your test results, treatment options, and if necessary, the need for more tests. Vaccines  Your health care provider may recommend certain vaccines, such as:  Influenza vaccine. This is  recommended every year.  Tetanus, diphtheria, and acellular pertussis (Tdap, Td) vaccine. You may need a Td booster every 10 years.  Zoster vaccine. You may need this after age 37.  Pneumococcal 13-valent conjugate (PCV13) vaccine. One dose is recommended after age 53.  Pneumococcal polysaccharide (PPSV23) vaccine. One dose is recommended after age 58. Talk to your health care provider about which screenings and vaccines you need and how often you need them. This information is not intended to replace advice given to you by your health care provider. Make sure you discuss any questions you have with your health care provider. Document Released: 07/22/2015 Document Revised: 03/14/2016 Document Reviewed: 04/26/2015 Elsevier Interactive Patient Education  2017 [employer] Prevention in the Home Falls can cause injuries. They can happen to people of all ages. There are many things you can do to make your home safe and to help prevent falls. What can I do on the outside of my home?  Regularly fix the edges of walkways and driveways and fix any cracks.  Remove anything that might make you trip as you walk through a door, such as a raised step or threshold.  Trim any bushes or trees on the path to your home.  Use bright outdoor lighting.  Clear any walking paths of anything that might make someone trip, such as rocks or tools.  Regularly check to see if handrails are loose or broken. Make sure that both sides of any steps have handrails.  Any raised decks and porches should have guardrails on the edges.  Have any leaves, snow, or ice cleared regularly.  Use sand or salt on walking paths during winter.  Clean up any spills in your garage right away. This includes oil or grease spills. What can I do in the bathroom?  Use night lights.  Install grab bars by the toilet and in the tub and shower. Do not use towel bars as grab bars.  Use non-skid mats or decals in the tub or  shower.  If you need to sit down in the shower, use a plastic, non-slip stool.  Keep the floor dry. Clean up any water that spills on the floor as soon as it happens.  Remove soap buildup in the tub or shower regularly.  Attach bath mats securely with double-sided non-slip rug tape.  Do not have throw rugs and other things on the floor that can make you trip. What can I do in the bedroom?  Use night lights.  Make sure that you have a light by your bed that is easy to reach.  Do not use any sheets or blankets that are too big for your bed. They should not hang down onto the floor.  Have a firm chair that has side arms. You can use this for support while you get dressed.  Do not have throw rugs and other things on the floor that can make you trip. What can I do in the kitchen?  Clean up any spills right away.  Avoid walking on wet floors.  Keep items that you use a lot in easy-to-reach places.  If you need to reach something above you, use a strong step stool that has a grab bar.  Keep electrical cords out of the way.  Do not  use floor polish or wax that makes floors slippery. If you must use wax, use non-skid floor wax.  Do not have throw rugs and other things on the floor that can make you trip. What can I do with my stairs?  Do not leave any items on the stairs.  Make sure that there are handrails on both sides of the stairs and use them. Fix handrails that are broken or loose. Make sure that handrails are as long as the stairways.  Check any carpeting to make sure that it is firmly attached to the stairs. Fix any carpet that is loose or worn.  Avoid having throw rugs at the top or bottom of the stairs. If you do have throw rugs, attach them to the floor with carpet tape.  Make sure that you have a light switch at the top of the stairs and the bottom of the stairs. If you do not have them, ask someone to add them for you. What else can I do to help prevent  falls?  Wear shoes that:  Do not have high heels.  Have rubber bottoms.  Are comfortable and fit you well.  Are closed at the toe. Do not wear sandals.  If you use a stepladder:  Make sure that it is fully opened. Do not climb a closed stepladder.  Make sure that both sides of the stepladder are locked into place.  Ask someone to hold it for you, if possible.  Clearly mark and make sure that you can see:  Any grab bars or handrails.  First and last steps.  Where the edge of each step is.  Use tools that help you move around (mobility aids) if they are needed. These include:  Canes.  Walkers.  Scooters.  Crutches.  Turn on the lights when you go into a dark area. Replace any light bulbs as soon as they burn out.  Set up your furniture so you have a clear path. Avoid moving your furniture around.  If any of your floors are uneven, fix them.  If there are any pets around you, be aware of where they are.  Review your medicines with your doctor. Some medicines can make you feel dizzy. This can increase your chance of falling. Ask your doctor what other things that you can do to help prevent falls. This information is not intended to replace advice given to you by your health care provider. Make sure you discuss any questions you have with your health care provider. Document Released: 04/21/2009 Document Revised: 12/01/2015 Document Reviewed: 07/30/2014 Elsevier Interactive Patient Education  2017 [employer].

## 2020-09-27 NOTE — Progress Notes (Signed)
PCP notes:  Health Maintenance: Mammogram- due Dexa- due   Abnormal Screenings: none   Patient concerns: Low back pain  Tingling in both hands   Nurse concerns: none   Next PCP appt.: 10/03/2020 @ 10 am

## 2020-09-27 NOTE — Progress Notes (Signed)
Subjective:   Erica Carlson is a 75 y.o. female who presents for Medicare Annual (Subsequent) preventive examination.  Review of Systems: N/A      I connected with the patient today by telephone and verified that I am speaking with the correct person using two identifiers. Location patient: home Location nurse: work Persons participating in the telephone visit: patient, nurse.   I discussed the limitations, risks, security and privacy concerns of performing an evaluation and management service by telephone and the availability of in person appointments. I also discussed with the patient that there may be a patient responsible charge related to this service. The patient expressed understanding and verbally consented to this telephonic visit.        Cardiac Risk Factors include: advanced age (>40men, >6 women);hypertension;Other (see comment), Risk factor comments: hyperlipidemia     Objective:    Today's Vitals   09/27/20 1440  PainSc: 6    There is no height or weight on file to calculate BMI.  Advanced Directives 09/27/2020 01/28/2020 12/17/2019 11/05/2019 09/14/2019 08/25/2019 07/01/2019  Does Patient Have a Medical Advance Directive? Yes No No No No No No  Type of Paramedic of [place];Living will - - - - - -  Copy of [place] in Chart? No - copy requested - - - - - -  Would patient like information on creating a medical advance directive? - No - Patient declined No - Patient declined No - Patient declined No - Patient declined No - Patient declined -    Current Medications (verified) Outpatient Encounter Medications as of 09/27/2020  Medication Sig  . acyclovir (ZOVIRAX) 400 MG tablet Take 1 tablet (400 mg total) by mouth 2 (two) times daily.  Marland Kitchen amLODipine (NORVASC) 10 MG tablet TAKE 1 TABLET(10 MG) BY MOUTH DAILY  . Cholecalciferol (VITAMIN D-3) 5000 units TABS Take 1 tablet by mouth daily.  . collagenase (SANTYL) ointment Apply  1 application topically daily.  . diphenhydrAMINE (BENADRYL) 25 mg capsule Take 25 mg by mouth every 6 (six) hours as needed for itching or allergies.  Marland Kitchen lisinopril (ZESTRIL) 10 MG tablet Take 0.5 tablets (5 mg total) by mouth daily.  . Multiple Vitamins-Minerals (PRESERVISION AREDS 2 PO) Take by mouth.  . mupirocin ointment (BACTROBAN) 2 % Apply 1 application topically 2 (two) times daily.  . polyethylene glycol (MIRALAX / GLYCOLAX) 17 g packet Take 17 g by mouth daily.  . rosuvastatin (CRESTOR) 10 MG tablet Take 1 tablet (10 mg total) by mouth daily. Please make overdue appt with Dr. Marlou Porch before anymore refills. 2nd attempt  . venetoclax (VENCLEXTA) 100 MG tablet Take 2 tablets (200 mg total) by mouth daily.   No facility-administered encounter medications on file as of 09/27/2020.    Allergies (verified) Hctz [hydrochlorothiazide] and Lipitor [atorvastatin]   History: Past Medical History:  Diagnosis Date  . Allergy    spring  . Arthritis    osteo arthritis of right knee  . Cataract    forming  . Hyperlipidemia   . Hypertension   . 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 Pneumonia    Past Surgical History:  Procedure Laterality Date  . ABDOMINAL HYSTERECTOMY    . AXILLARY LYMPH NODE BIOPSY Right 02/07/2016  . back injections    . BREAST SURGERY    . COLONOSCOPY  2009  . EYE SURGERY     retinal tear repair  . KNEE SURGERY  torn ALC per pt   Family History  Problem Relation Age of Onset  . Breast cancer Sister   . Hyperlipidemia Son   . Schizophrenia Mother   . Colon cancer Neg Hx   . Colon polyps Neg Hx   . Esophageal cancer Neg Hx   . Rectal cancer Neg Hx   . Stomach cancer Neg Hx    Social History   Socioeconomic History  . Marital status: Widowed    Spouse name: n/a  . Number of children: 6  . Years of education: 30  . Highest education level: Not on file  Occupational History  . 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 Smoking status: Never Smoker  . Smokeless tobacco: Never Used  Vaping Use  . Vaping Use: Never used  Substance and Sexual Activity  . Alcohol use: No    Alcohol/week: 0.0 standard drinks  . Drug use: No  . Sexual activity: Never  Other Topics Concern  . Not on file  Social History Narrative   Lives alone.   Her adult children live nearby.   Social Determinants of Health   Financial Resource Strain: Low Risk   . Difficulty of Paying Living Expenses: Not hard at all  Food Insecurity: No Food Insecurity  . Worried About Charity fundraiser in the Last Year: Never true  . Ran Out of Food in the Last Year: Never true  Transportation Needs: No Transportation Needs  . Lack of Transportation (Medical): No  . Lack of Transportation (Non-Medical): No  Physical Activity: Inactive  . Days of Exercise per Week: 0 days  . Minutes of Exercise per Session: 0 min  Stress: No Stress Concern Present  . Feeling of Stress : Not at all  Social Connections: Not on file    Tobacco Counseling Counseling given: Not Answered   Clinical Intake:  Pre-visit preparation completed: Yes  Pain : 0-10 Pain Score: 6  Pain Type: Chronic pain Pain Location: Back Pain Orientation: Lower Pain Descriptors / Indicators: Aching Pain Onset: More than a month ago Pain Frequency: Intermittent     Nutritional Risks: None Diabetes: No  How often do you need to have someone help you when you read instructions, pamphlets, or other written materials from your doctor or pharmacy?: 1 - Never What is the last grade level you completed in school?: 2 years of college  Diabetic: No Nutrition Risk Assessment:  Has the patient had any N/V/D within the last 2 months?  No  Does the patient have any non-healing wounds?  No  Has the patient had any unintentional weight loss or weight gain?  No   Diabetes:  Is the patient diabetic?  No  If diabetic, was a CBG  obtained today?  N/A Did the patient bring in their glucometer from home?  N/A How often do you monitor your CBG's? N/A.   Financial Strains and Diabetes Management:  Are you having any financial strains with the device, your supplies or your medication? N/A.  Does the patient want to be seen by Chronic Care Management for management of their diabetes?  N/A Would the patient like to be referred to a Nutritionist or for Diabetic Management?  N/A  Interpreter Needed?: No  Information entered by :: CJohnson, LPN   Activities of Daily Living In your present state of health, do you have any difficulty performing the following activities: 09/27/2020  Hearing?  N  Vision? N  Difficulty concentrating or making decisions? N  Walking or climbing stairs? N  Dressing or bathing? N  Doing errands, shopping? N  Preparing Food and eating ? N  Using the Toilet? N  In the past six months, have you accidently leaked urine? N  Do you have problems with loss of bowel control? N  Managing your Medications? N  Managing your Finances? N  Housekeeping or managing your Housekeeping? N  Some recent data might be hidden    Patient Care Team: Tower, Wynelle Fanny, MD as PCP - General (Family Medicine) Galvin Proffer, OD as Consulting Physician (Optometry) Brunetta Genera, MD as Consulting Physician (Hematology)  Indicate any recent Medical Services you may have received from other than [facility] providers in the past year (date may be approximate).     Assessment:   This is a routine wellness examination for Tahlia.  Hearing/Vision screen  Hearing Screening   125Hz  250Hz  500Hz  1000Hz  2000Hz  3000Hz  4000Hz  6000Hz  8000Hz   Right ear:           Left ear:           Vision Screening Comments: Patient gets annual eye exams   Dietary issues and exercise activities discussed: Current Exercise Habits: The patient does not participate in regular exercise at present, Exercise limited by: None  identified  Goals    . Increase physical activity     Starting 06/27/16, I will continue to exercise at least 60 min 3-4 days per week.     . Patient Stated     09/27/2020, I will maintain and continue medications as prescribed.       Depression Screen PHQ 2/9 Scores 09/27/2020 07/22/2019 07/14/2018 07/08/2017 06/27/2016 02/26/2015 09/30/2013  PHQ - 2 Score 0 0 0 1 0 0 0  PHQ- 9 Score 0 - - - - - -    Fall Risk Fall Risk  09/27/2020 07/22/2019 07/14/2018 07/08/2017 06/27/2016  Falls in the past year? 0 0 0 No No  Number falls in past yr: 0 0 0 - -  Injury with Fall? 0 - - - -  Risk for fall due to : Medication side effect - - - -  Follow up Falls evaluation completed;Falls prevention discussed Falls evaluation completed - - -    FALL RISK PREVENTION PERTAINING TO THE HOME:  Any stairs in or around the home? Yes  If so, are there any without handrails? No  Home free of loose throw rugs in walkways, pet beds, electrical cords, etc? Yes  Adequate lighting in your home to reduce risk of falls? Yes   ASSISTIVE DEVICES UTILIZED TO PREVENT FALLS:  Life alert? No  Use of a cane, walker or w/c? No  Grab bars in the bathroom? No  Shower chair or bench in shower? No  Elevated toilet seat or a handicapped toilet? No   TIMED UP AND GO:  Was the test performed? N/A telephone visit.  Cognitive Function: MMSE - Mini Mental State Exam 09/27/2020 06/27/2016  Orientation to time 5 5  Orientation to Place 5 5  Registration 3 3  Attention/ Calculation 5 0  Recall 3 3  Language- name 2 objects - 0  Language- repeat 1 1  Language- follow 3 step command - 3  Language- read & follow direction - 0  Write a sentence - 0  Copy design - 0  Total score - 20  Mini Cog  Mini-Cog screen was completed. Maximum score is 22.  A value of 0 denotes this part of the MMSE was not completed or the patient failed this part of the Mini-Cog screening.       Immunizations Immunization History  Administered  Date(s) Administered  . Fluad Quad(high Dose 65+) 05/18/2020  . Influenza Split 04/23/2012  . Influenza,inj,Quad PF,6+ Mos 04/29/2013, 07/04/2016, 07/08/2017, 04/21/2019  . PFIZER(Purple Top)SARS-COV-2 Vaccination 08/30/2019, 09/23/2019, 03/24/2020  . Pneumococcal Conjugate-13 07/04/2016  . Pneumococcal Polysaccharide-23 12/15/2010, 07/08/2017  . Td 03/03/2009  . Tdap 08/25/2014    TDAP status: Up to date  Flu Vaccine status: Up to date  Pneumococcal vaccine status: Up to date  Covid-19 vaccine status: Completed vaccines  Qualifies for Shingles Vaccine? Yes   Zostavax completed No   Shingrix Completed?: No.    Education has been provided regarding the importance of this vaccine. Patient has been advised to call insurance company to determine out of pocket expense if they have not yet received this vaccine. Advised may also receive vaccine at local pharmacy or Health Dept. Verbalized acceptance and understanding.  Screening Tests Health Maintenance  Topic Date Due  . DEXA SCAN  Never done  . MAMMOGRAM  10/10/2018  . COVID-19 Vaccine (4 - Booster for Pfizer series) 09/21/2020  . TETANUS/TDAP  08/25/2024  . COLONOSCOPY (Pts 45-79yrs Insurance coverage will need to be confirmed)  04/14/2028  . INFLUENZA VACCINE  Completed  . Hepatitis C Screening  Completed  . PNA vac Low Risk Adult  Completed  . HPV VACCINES  Aged Out    Health Maintenance  Health Maintenance Due  Topic Date Due  . DEXA SCAN  Never done  . MAMMOGRAM  10/10/2018  . COVID-19 Vaccine (4 - Booster for Pfizer series) 09/21/2020    Colorectal cancer screening: Type of screening: Colonoscopy. Completed 04/14/2018. Repeat every 10 years  Mammogram status: due, will discuss with provider at physical   Bone Density status: due, will discuss with provider at physical   Lung Cancer Screening: (Low Dose CT Chest recommended if Age 42-80 years, 30 pack-year currently smoking OR have quit w/in 15 years.) does not  qualify.   Additional Screening:  Hepatitis C Screening: does qualify; Completed 06/27/2016  Vision Screening: Recommended annual ophthalmology exams for early detection of glaucoma and other disorders of the eye. Is the patient up to date with their annual eye exam?  Yes  Who is the provider or what is the name of the office in which the patient attends annual eye exams? [facility]  If pt is not established with a provider, would they like to be referred to a provider to establish care? No .   Dental Screening: Recommended annual dental exams for proper oral hygiene  Community Resource Referral / Chronic Care Management: CRR required this visit?  No   CCM required this visit?  No      Plan:     I have personally reviewed and noted the following in the patient's chart:   . Medical and social history . Use of alcohol, tobacco or illicit drugs  . Current medications and supplements . Functional ability and status . Nutritional status . Physical activity . Advanced directives . List of other physicians . Hospitalizations, surgeries, and ER visits in previous 12 months . Vitals . Screenings to include cognitive, depression, and falls . Referrals and appointments  In addition, I have reviewed and discussed with patient certain preventive protocols, quality metrics, and best practice recommendations. A written personalized care plan for preventive services as well  as general preventive health recommendations were provided to patient.   Due to this being a telephonic visit, the after visit summary with patients personalized plan was offered to patient via office or my-chart. Patient preferred to pick up at office at next visit or via mychart.   Andrez Grime, LPN   [id number]

## 2020-09-28 MED FILL — VENCLEXTA 100 MG TABS: 100 | 30 days supply | Qty: 60 | Fill #2

## 2020-10-03 ENCOUNTER — Encounter: Payer: Medicare PPO | Admitting: Family Medicine

## 2020-10-04 ENCOUNTER — Other Ambulatory Visit: Payer: Self-pay

## 2020-10-04 ENCOUNTER — Encounter: Payer: Self-pay | Admitting: Family Medicine

## 2020-10-04 ENCOUNTER — Ambulatory Visit (INDEPENDENT_AMBULATORY_CARE_PROVIDER_SITE_OTHER): Payer: Medicare PPO | Admitting: Family Medicine

## 2020-10-04 VITALS — BP 132/80 | HR 83 | Temp 97.0°F | Ht 61.0 in | Wt 172.2 lb

## 2020-10-04 DIAGNOSIS — E2839 Other primary ovarian failure: Secondary | ICD-10-CM

## 2020-10-04 DIAGNOSIS — Z0000 Encounter for general adult medical examination without abnormal findings: Secondary | ICD-10-CM | POA: Diagnosis not present

## 2020-10-04 DIAGNOSIS — M79642 Pain in left hand: Secondary | ICD-10-CM

## 2020-10-04 DIAGNOSIS — E782 Mixed hyperlipidemia: Secondary | ICD-10-CM

## 2020-10-04 DIAGNOSIS — C8300 Small cell B-cell lymphoma, unspecified site: Secondary | ICD-10-CM | POA: Diagnosis not present

## 2020-10-04 DIAGNOSIS — M79641 Pain in right hand: Secondary | ICD-10-CM

## 2020-10-04 DIAGNOSIS — I10 Essential (primary) hypertension: Secondary | ICD-10-CM | POA: Diagnosis not present

## 2020-10-04 DIAGNOSIS — C9110 Chronic lymphocytic leukemia of B-cell type not having achieved remission: Secondary | ICD-10-CM

## 2020-10-04 DIAGNOSIS — M79643 Pain in unspecified hand: Secondary | ICD-10-CM | POA: Insufficient documentation

## 2020-10-04 MED ORDER — AMLODIPINE BESYLATE 10 MG PO TABS: ORAL_TABLET | ORAL | 3 refills | Status: DC

## 2020-10-04 MED ORDER — ROSUVASTATIN CALCIUM 10 MG PO TABS
10.0000 mg | ORAL_TABLET | Freq: Every day | ORAL | 3 refills | Status: DC
Start: 2020-10-04 — End: 2021-03-21

## 2020-10-04 MED ORDER — LISINOPRIL 10 MG PO TABS: 5.0000 mg | ORAL_TABLET | Freq: Every day | ORAL | 3 refills | Status: DC

## 2020-10-04 NOTE — Assessment & Plan Note (Signed)
Referral made for screening dexa  Takes vit D No falls or fractures

## 2020-10-04 NOTE — Assessment & Plan Note (Signed)
Disc goals for lipids and reasons to control them Rev last labs with pt Rev low sat fat diet in detail Plans to re start generic crestor 10 mg daily as LDL is up greatly despite good diet  Will re assess

## 2020-10-04 NOTE — Progress Notes (Signed)
Subjective:    Patient ID: Erica Carlson, female    DOB: [birth year], 75 y.o.   MRN: [id number]  This visit occurred during the SARS-CoV-2 public health emergency.  Safety protocols were in place, including screening questions prior to the visit, additional usage of staff PPE, and extensive cleaning of exam room while observing appropriate contact time as indicated for disinfecting solutions.    HPI Here for health maintenance exam and to review chronic medical problems    Wt Readings from Last 3 Encounters:  10/04/20 172 lb 4 oz (78.1 kg)  07/22/20 166 lb 12.8 oz (75.7 kg)  05/18/20 167 lb 1.6 oz (75.8 kg)   32.55 kg/m  amw was 3/22 Noted mammogram and dexa are due   Seasonal allergies are bothering her   covid vaccinated-? If needs another booster due to immune status  Zoster status -wants imm, had the dz twice   Mammogram 4/19- due for it/will schedule  Self breast exam - no nodes under arms or breast lumps   Colonoscopy 10/19   dexa- wants to schedule  Falls/fractures-none Supplements-takes vit D Exercise -not a lot but walks quite a bit during the day  Light exercise at home 1-2 times per week dep on how she feels   HTN bp is stable today  No cp or palpitations or headaches or edema  No side effects to medicines  BP Readings from Last 3 Encounters:  10/04/20 132/80  07/22/20 (!) 159/86  05/18/20 (!) 163/86    Amlodipine 10 mg daily  Lisinopril 10 mg daily  Has not taken medicines today yet  (takes at 9 am)    CLL Seeing oncology venetoclax tx-will come off of next month   Hyperlipidemia Lab Results  Component Value Date   CHOL 373 (H) 09/27/2020   CHOL 219 (H) 07/15/2019   CHOL 266 (H) 07/10/2018   Lab Results  Component Value Date   HDL 58.70 09/27/2020   HDL 52.60 07/15/2019   HDL 48.10 07/10/2018   Lab Results  Component Value Date   LDLCALC 147 (H) 07/15/2019   LDLCALC 189 (H) 07/10/2018   LDLCALC 163 (H) 09/06/2017   Lab Results   Component Value Date   TRIG 261.0 (H) 09/27/2020   TRIG 98.0 07/15/2019   TRIG 142.0 07/10/2018   Lab Results  Component Value Date   CHOLHDL 6 09/27/2020   CHOLHDL 4 07/15/2019   CHOLHDL 6 07/10/2018   Lab Results  Component Value Date   LDLDIRECT 204.0 09/27/2020   LDLDIRECT 204.9 02/23/2014   LDLDIRECT 249.3 04/23/2012   Off rosuvastatin -it was expensive for her  She watches diet very closely   Other labs Results for orders placed or performed in visit on 09/27/20  TSH  Result Value Ref Range   TSH 2.44 0.35 - 4.50 uIU/mL  Lipid panel  Result Value Ref Range   Cholesterol 373 (H) 0 - 200 mg/dL   Triglycerides 261.0 (H) 0.0 - 149.0 mg/dL   HDL 58.70 >39.00 mg/dL   VLDL 52.2 (H) 0.0 - 40.0 mg/dL   Total CHOL/HDL Ratio 6    NonHDL 314.75   Comprehensive metabolic panel  Result Value Ref Range   Sodium 139 135 - 145 mEq/L   Potassium 3.9 3.5 - 5.1 mEq/L   Chloride 104 96 - 112 mEq/L   CO2 27 19 - 32 mEq/L   Glucose, Bld 96 70 - 99 mg/dL   BUN 18 6 - 23 mg/dL   Creatinine, Ser  0.99 0.40 - 1.20 mg/dL   Total Bilirubin 0.4 0.2 - 1.2 mg/dL   Alkaline Phosphatase 84 39 - 117 U/L   AST 20 0 - 37 U/L   ALT 21 0 - 35 U/L   Total Protein 6.5 6.0 - 8.3 g/dL   Albumin 4.5 3.5 - 5.2 g/dL   GFR 56.37 (L) >60.00 mL/min   Calcium 10.0 8.4 - 10.5 mg/dL  CBC with Differential/Platelet  Result Value Ref Range   WBC 4.8 4.0 - 10.5 K/uL   RBC 4.50 3.87 - 5.11 Mil/uL   Hemoglobin 12.5 12.0 - 15.0 g/dL   HCT 38.1 36.0 - 46.0 %   MCV 84.8 78.0 - 100.0 fl   MCHC 32.8 30.0 - 36.0 g/dL   RDW 14.0 11.5 - 15.5 %   Platelets 208.0 150.0 - 400.0 K/uL   Neutrophils Relative % 31.1 (L) 43.0 - 77.0 %   Lymphocytes Relative 56.8 (H) 12.0 - 46.0 %   Monocytes Relative 9.0 3.0 - 12.0 %   Eosinophils Relative 1.6 0.0 - 5.0 %   Basophils Relative 1.5 0.0 - 3.0 %   Neutro Abs 1.5 1.4 - 7.7 K/uL   Lymphs Abs 2.7 0.7 - 4.0 K/uL   Monocytes Absolute 0.4 0.1 - 1.0 K/uL   Eosinophils  Absolute 0.1 0.0 - 0.7 K/uL   Basophils Absolute 0.1 0.0 - 0.1 K/uL  LDL cholesterol, direct  Result Value Ref Range   Direct LDL 204.0 mg/dL    Hands bother her  ? Trigger fingers-both 3,4th digits at times   Patient Active Problem List   Diagnosis Date Noted  . Hand pain 10/04/2020  . CLL (chronic lymphocytic leukemia) ([place]) 08/31/2019  . Counseling regarding advance care planning and goals of care 08/31/2019  . Need for hepatitis C screening test 06/26/2016  . 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 Encounter for Medicare annual wellness exam 09/30/2013  . Estrogen deficiency 09/17/2012  . Herpes zoster without complication 18/84/1660  . Special screening for malignant neoplasms, colon 12/15/2010  . Other screening mammogram 12/15/2010  . Routine general medical examination at a health care facility 12/08/2010  . Low back pain 09/06/2010  . OSTEOARTHRITIS, KNEE, RIGHT 10/11/2008  . Internal hemorrhoids 09/02/2008  . Hypertension 04/28/2008  . PNEUMONIA, HX OF 03/03/2008  . Hyperlipidemia 02/26/2008   Past Medical History:  Diagnosis Date  . Allergy    spring  . Arthritis    osteo arthritis of right knee  . Cataract    forming  . Hyperlipidemia   . Hypertension   . Lymphoma, small-cell ([place])    right axillary   . Pneumonia    Past Surgical History:  Procedure Laterality Date  . ABDOMINAL HYSTERECTOMY    . AXILLARY LYMPH NODE BIOPSY Right 02/07/2016  . back injections    . BREAST SURGERY    . COLONOSCOPY  2009  . EYE SURGERY     retinal tear repair  . KNEE SURGERY     torn ALC per pt   Social History   Tobacco Use  . Smoking status: Never Smoker  . Smokeless tobacco: Never Used  Vaping Use  . Vaping Use: Never used  Substance Use Topics  . Alcohol use: No    Alcohol/week: 0.0 standard drinks  . Drug use: No   Family History  Problem Relation Age of Onset  . Breast cancer Sister   . Hyperlipidemia Son   . Schizophrenia Mother   . Colon  cancer Neg Hx   .  Colon polyps Neg Hx   . Esophageal cancer Neg Hx   . Rectal cancer Neg Hx   . Stomach cancer Neg Hx    Allergies  Allergen Reactions  . Hctz [Hydrochlorothiazide] Nausea And Vomiting    Nausea and vomiting   . Lipitor [Atorvastatin] Other (See Comments)    Ache in back and legs   Current Outpatient Medications on File Prior to Visit  Medication Sig Dispense Refill  . Cholecalciferol (VITAMIN D-3) 5000 units TABS Take 1 tablet by mouth daily.    . diphenhydrAMINE (BENADRYL) 25 mg capsule Take 25 mg by mouth every 6 (six) hours as needed for itching or allergies.    . Multiple Vitamins-Minerals (PRESERVISION AREDS 2 PO) Take by mouth.    . polyethylene glycol (MIRALAX / GLYCOLAX) 17 g packet Take 17 g by mouth daily.    Marland Kitchen venetoclax (VENCLEXTA) 100 MG tablet Take 2 tablets (200 mg total) by mouth daily. 60 tablet 2   No current facility-administered medications on file prior to visit.     Review of Systems  Constitutional: Positive for fatigue. Negative for activity change, appetite change, fever and unexpected weight change.  HENT: Negative for congestion, ear pain, rhinorrhea, sinus pressure and sore throat.   Eyes: Negative for pain, redness and visual disturbance.  Respiratory: Negative for cough, shortness of breath and wheezing.   Cardiovascular: Negative for chest pain and palpitations.  Gastrointestinal: Negative for abdominal pain, blood in stool, constipation and diarrhea.       Bloating from current medicine  Endocrine: Negative for polydipsia and polyuria.  Genitourinary: Negative for dysuria, frequency and urgency.  Musculoskeletal: Positive for arthralgias and back pain. Negative for myalgias.       Trigger finger?  Skin: Negative for pallor and rash.  Allergic/Immunologic: Negative for environmental allergies.  Neurological: Negative for dizziness, syncope and headaches.  Hematological: Negative for adenopathy. Does not bruise/bleed easily.   Psychiatric/Behavioral: Negative for decreased concentration and dysphoric mood. The patient is not nervous/anxious.        Objective:   Physical Exam Constitutional:      General: She is not in acute distress.    Appearance: Normal appearance. She is well-developed. She is not ill-appearing or diaphoretic.  HENT:     Head: Normocephalic and atraumatic.     Right Ear: Tympanic membrane, ear canal and external ear normal.     Left Ear: Tympanic membrane, ear canal and external ear normal.     Nose: Nose normal. No congestion.     Mouth/Throat:     Mouth: Mucous membranes are moist.     Pharynx: Oropharynx is clear. No posterior oropharyngeal erythema.  Eyes:     General: No scleral icterus.    Extraocular Movements: Extraocular movements intact.     Conjunctiva/sclera: Conjunctivae normal.     Pupils: Pupils are equal, round, and reactive to light.  Neck:     Thyroid: No thyromegaly.     Vascular: No carotid bruit or JVD.  Cardiovascular:     Rate and Rhythm: Normal rate and regular rhythm.     Pulses: Normal pulses.     Heart sounds: Murmur heard.  No gallop.   Pulmonary:     Effort: Pulmonary effort is normal. No respiratory distress.     Breath sounds: Normal breath sounds. No wheezing.     Comments: Good air exch Chest:     Chest wall: No tenderness.  Abdominal:     General: Bowel sounds are normal. There  is no distension or abdominal bruit.     Palpations: Abdomen is soft. There is no mass.     Tenderness: There is no abdominal tenderness.     Hernia: No hernia is present.  Genitourinary:    Comments: Breast exam: No mass, nodules, thickening, tenderness, bulging, retraction, inflamation, nipple discharge or skin changes noted.  No axillary or clavicular LA.     Musculoskeletal:        General: No tenderness. Normal range of motion.     Cervical back: Normal range of motion and neck supple. No rigidity. No muscular tenderness.     Right lower leg: No edema.      Left lower leg: No edema.     Comments: No kyphosis   Lymphadenopathy:     Cervical: No cervical adenopathy.  Skin:    General: Skin is warm and dry.     Coloration: Skin is not pale.     Findings: No erythema or rash.     Comments: Lentigines on forearms  Neurological:     Mental Status: She is alert. Mental status is at baseline.     Cranial Nerves: No cranial nerve deficit.     Motor: No abnormal muscle tone.     Coordination: Coordination normal.     Gait: Gait normal.     Deep Tendon Reflexes: Reflexes are normal and symmetric. Reflexes normal.  Psychiatric:        Mood and Affect: Mood normal.        Cognition and Memory: Cognition and memory normal.           Assessment & Plan:   Problem List Items Addressed This Visit      Cardiovascular and Mediastinum   Hypertension    bp in fair control at this time  BP Readings from Last 1 Encounters:  10/04/20 132/80   No changes needed Most recent labs reviewed  Disc lifstyle change with low sodium diet and exercise  Plan to continue amlodipine 10 mg and lisinopril 10 mg daily       Relevant Medications   rosuvastatin (CRESTOR) 10 MG tablet   amLODipine (NORVASC) 10 MG tablet   lisinopril (ZESTRIL) 10 MG tablet     Other   Hyperlipidemia    Disc goals for lipids and reasons to control them Rev last labs with pt Rev low sat fat diet in detail Plans to re start generic crestor 10 mg daily as LDL is up greatly despite good diet  Will re assess      Relevant Medications   rosuvastatin (CRESTOR) 10 MG tablet   amLODipine (NORVASC) 10 MG tablet   lisinopril (ZESTRIL) 10 MG tablet   Routine general medical examination at a health care facility - Primary    Reviewed health habits including diet and exercise and skin cancer prevention Reviewed appropriate screening tests for age  Also reviewed health mt list, fam hx and immunization status , as well as social and family history   See HPI Labs reviewed  Plans to  schedule mammogram, reminder given Will get a dexa at the same time for screening Colonoscopy utd Under care for CLL Disc covid booster and shingrix once safe to do so Plans to f/u with Dr Lorelei Pont for hand pain/trigger finger as well  Cholesterol is high- plans to re start crestor 10 mg daily  Commended on good diet       Estrogen deficiency    Referral made for screening dexa  Takes vit  D No falls or fractures      Relevant Orders   DG Bone Density   Lymphoma, small lymphocytic ([place])    Continues oncology care Taking venetoclax and plans to stop it next month  Some side effects-bloating/wt gain per pt  Once off this may be able to get shingrix, covid booster        CLL (chronic lymphocytic leukemia) ([facility])   Hand pain

## 2020-10-04 NOTE — Assessment & Plan Note (Signed)
bp in fair control at this time  BP Readings from Last 1 Encounters:  10/04/20 132/80   No changes needed Most recent labs reviewed  Disc lifstyle change with low sodium diet and exercise  Plan to continue amlodipine 10 mg and lisinopril 10 mg daily

## 2020-10-04 NOTE — Assessment & Plan Note (Signed)
Reviewed health habits including diet and exercise and skin cancer prevention Reviewed appropriate screening tests for age  Also reviewed health mt list, fam hx and immunization status , as well as social and family history   See HPI Labs reviewed  Plans to schedule mammogram, reminder given Will get a dexa at the same time for screening Colonoscopy utd Under care for CLL Disc covid booster and shingrix once safe to do so Plans to f/u with Dr Lorelei Pont for hand pain/trigger finger as well  Cholesterol is high- plans to re start crestor 10 mg daily  Commended on good diet

## 2020-10-04 NOTE — Assessment & Plan Note (Signed)
Continues oncology care Taking venetoclax and plans to stop it next month  Some side effects-bloating/wt gain per pt  Once off this may be able to get shingrix, covid booster

## 2020-10-04 NOTE — Patient Instructions (Addendum)
Ask your oncologist if you could take claritin or allegra or zyrtec  They are otc for allergies   If you are interested in the new shingles vaccine (Shingrix) - call your local pharmacy to check on coverage and availability  If affordable, get on a wait list at your pharmacy to get the vaccine. Check with your oncologist before you get it   Don't forget to schedule your mammogram and bone density test   I want you to re start rosuvastatin (crestor) when you can afford it   Make an appt with Dr Lorelei Pont for your hands (trigger finger) once you are off the oncology medication

## 2020-10-06 ENCOUNTER — Other Ambulatory Visit (HOSPITAL_COMMUNITY): Payer: Self-pay

## 2020-10-07 ENCOUNTER — Ambulatory Visit (HOSPITAL_COMMUNITY)
Admission: RE | Admit: 2020-10-07 | Discharge: 2020-10-07 | Disposition: A | Discharge status: Home / Self Care | Payer: Medicare PPO | Source: Ambulatory Visit | Attending: Hematology | Admitting: Hematology

## 2020-10-07 ENCOUNTER — Encounter (HOSPITAL_COMMUNITY): Payer: Self-pay

## 2020-10-07 ENCOUNTER — Other Ambulatory Visit: Payer: Self-pay

## 2020-10-07 DIAGNOSIS — Q632 Ectopic kidney: Secondary | ICD-10-CM | POA: Diagnosis not present

## 2020-10-07 DIAGNOSIS — C9110 Chronic lymphocytic leukemia of B-cell type not having achieved remission: Secondary | ICD-10-CM | POA: Insufficient documentation

## 2020-10-07 DIAGNOSIS — J9811 Atelectasis: Secondary | ICD-10-CM | POA: Diagnosis not present

## 2020-10-07 DIAGNOSIS — I700 Atherosclerosis of aorta: Secondary | ICD-10-CM | POA: Diagnosis not present

## 2020-10-07 DIAGNOSIS — M2578 Osteophyte, vertebrae: Secondary | ICD-10-CM | POA: Diagnosis not present

## 2020-10-07 MED ORDER — IOHEXOL 300 MG/ML  SOLN
100.0000 mL | Freq: Once | INTRAMUSCULAR | Status: AC | PRN
  Administered 2020-10-07: 100 mL via INTRAVENOUS

## 2020-10-13 NOTE — Progress Notes (Signed)
HEMATOLOGY/ONCOLOGY CLINIC NOTE  Date of Service: 10/14/2020  Patient Care Team: Tower, Wynelle Fanny, MD as PCP - General (Family Medicine) Galvin Proffer, [place] as Consulting Physician (Optometry) Brunetta Genera, MD as Consulting Physician (Hematology)  CHIEF COMPLAINTS/PURPOSE OF CONSULTATION:  Chronic Lymphocytic Leukemia  HISTORY OF PRESENTING ILLNESS:   Erica Carlson is a wonderful 75 y.o. female who has been referred to [place] by my colleague Dr. 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 The pt reports that she is doing well overall.   The pt reports that her SLL was initially picked up tthrough a mammogram in 2017. She notes that her CLL/SLL was stable until December 2019. She notes that she began having feelings of indigestion when she eats. She denies issues with acid reflux. The pt notes that her indigestion began making it difficult to eat, but also endorses a weakened appetite. She lost about 25 pounds, but has gained back 5 pounds. The pt had an unrevealing endoscopy on 07/18/18. She notes that she has some difficulty eating meats. She is staying well hydrated. She has experimented with eating different foods. She endorses having a "lot of gas," and puts a teaspoon of her Mylanta in her coffee every morning.  The pt denies any fevers, chills, or night sweats. She has noticed some lumps in her bilateral neck and armpits. She denies other abdominal pains as such. She denies problems passing urine, nor increased urinary frequency. She denies CP or SOB. She denies leg swelling.   Denies liver, kidney, heart, or lung problems and endorses being generally pretty healthy.  Of note prior to the patient's visit today, pt has had a PET/CT completed on 08/15/18 with results revealing Bulky lymphadenopathy in the neck, chest, abdomen, and pelvis demonstrating low level hypermetabolism throughout. 9 mm right thyroid nodule is hypermetabolic.  Thyroid ultrasound recommended to further evaluate. Mucosal thickening with air-fluid level in the left maxillary sinus. 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  Most recent lab results (09/22/18) of CBC w/diff and BMP is as follows: all values are WNL except for WBC at 16.8k, HGB at 10.9, MCHC at 29.7, ANC at 1.2k, Lymphs abs at 11.3k, Monocytes abs at 4.1k, Creatinine at 1.06.  On review of systems, pt reports frequent indigestion, weakened appetite, weight loss, weaker energy levels, bilateral neck bumps, bilateral armpit bumps, and denies CP, SOB, abdominal pain, leg swelling, fevers, chills, night sweats, lower abdominal pains, changes in bowel habits, and any other symptoms.  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 The patient's last visit with [place] was on 07/22/2020 The pt reports that she is doing well overall.  The pt reports that she has been having some arthritis in her fingers, but no new issues or concerns. The pt notes that her PCP has put her on a medication for he high cholesterol, but notes no other medication changes.  Of note since the patient's last visit, pt has had CT C/A/P on 10/07/2020, which revealed "Chest Impression: 1. Decrease in size of prominent axillary lymph nodes. 2. No mediastinal lymphadenopathy. 3. No evidence of disease progression.  Abdomen / Pelvis Impression: 1. Reduction in volume is pelvic iliac adenopathy. 2. No new adenopathy in the abdomen pelvis. 3. Normal spleen."  Lab results today 10/14/2020 of CBC w/diff and CMP is as  follows: all values are WNL except for Creatinine of 1.06, GFR est of 55. 10/14/2020 LDH of 308.  On review of systems, pt denies new lumps/bumps, fevers, chills, night sweats, sudden weight loss, SOB, chest pain, diarrhea, abdominal pain, skin ulcers, leg  swelling, and any other symptoms.  MEDICAL HISTORY:  Past Medical History:  Diagnosis Date  . Allergy    spring  . Arthritis    osteo arthritis of right knee  . Cataract    forming  . Hyperlipidemia   . Hypertension   . 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 Pneumonia     SURGICAL HISTORY: Past Surgical History:  Procedure Laterality Date  . ABDOMINAL HYSTERECTOMY    . AXILLARY LYMPH NODE BIOPSY Right 02/07/2016  . back injections    . BREAST SURGERY    . COLONOSCOPY  2009  . EYE SURGERY     retinal tear repair  . KNEE SURGERY     torn ALC per pt    SOCIAL HISTORY: Social History   Socioeconomic History  . Marital status: Widowed    Spouse name: n/a  . Number of children: 6  . Years of education: 59  . Highest education level: Not on file  Occupational History  . 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 Smoking status: Never Smoker  . Smokeless tobacco: Never Used  Vaping Use  . Vaping Use: Never used  Substance and Sexual Activity  . Alcohol use: No    Alcohol/week: 0.0 standard drinks  . Drug use: No  . Sexual activity: Never  Other Topics Concern  . Not on file  Social History Narrative   Lives alone.   Her adult children live nearby.   Social Determinants of Health   Financial Resource Strain: Low Risk   . Difficulty of Paying Living Expenses: Not hard at all  Food Insecurity: No Food Insecurity  . Worried About Charity fundraiser in the Last Year: Never true  . Ran Out of Food in the Last Year: Never true  Transportation Needs: No Transportation Needs  . Lack of Transportation (Medical): No  . Lack of Transportation (Non-Medical): No  Physical Activity: Inactive  . Days of Exercise per Week: 0 days  . Minutes of Exercise per Session: 0 min  Stress: No Stress Concern Present  . Feeling of Stress : Not at all  Social Connections: Not on file  Intimate Partner  Violence: Not At Risk  . Fear of Current or Ex-Partner: No  . Emotionally Abused: No  . Physically Abused: No  . Sexually Abused: No    FAMILY HISTORY: Family History  Problem Relation Age of Onset  . Breast cancer Sister   . Hyperlipidemia Son   . Schizophrenia Mother   . Colon cancer Neg Hx   . Colon polyps Neg Hx   . Esophageal cancer Neg Hx   . Rectal cancer Neg Hx   . Stomach cancer Neg Hx     ALLERGIES:  is allergic to hctz [hydrochlorothiazide] and lipitor [atorvastatin].  MEDICATIONS:  Current Outpatient Medications  Medication Sig Dispense Refill  . amLODipine (NORVASC) 10 MG tablet TAKE 1 TABLET(10 MG) BY MOUTH DAILY 90 tablet 3  . Cholecalciferol (VITAMIN D-3) 5000 units TABS Take 1 tablet by mouth daily.    . diphenhydrAMINE (BENADRYL) 25 mg capsule Take 25 mg by mouth every 6 (six) hours as  needed for itching or allergies.    . Multiple Vitamins-Minerals (PRESERVISION AREDS 2 PO) Take by mouth.    . polyethylene glycol (MIRALAX / GLYCOLAX) 17 g packet Take 17 g by mouth daily.    . rosuvastatin (CRESTOR) 10 MG tablet Take 1 tablet (10 mg total) by mouth daily. 90 tablet 3  . lisinopril (ZESTRIL) 10 MG tablet Take 0.5 tablets (5 mg total) by mouth daily. (Patient not taking: Reported on 10/14/2020) 45 tablet 3   No current facility-administered medications for this visit.    REVIEW OF SYSTEMS:   10 Point review of Systems was done is negative except as noted above.  PHYSICAL EXAMINATION: ECOG FS:1 - Symptomatic but completely ambulatory  Vitals:   10/14/20 1213  BP: (!) 149/72  Pulse: 76  Resp: 18  Temp: 97.7 F (36.5 C)  SpO2: 99%   Wt Readings from Last 3 Encounters:  10/14/20 173 lb 3.2 oz (78.6 kg)  10/04/20 172 lb 4 oz (78.1 kg)  07/22/20 166 lb 12.8 oz (75.7 kg)   Body mass index is 32.73 kg/m.    NAD. GENERAL:alert, in no acute distress and comfortable SKIN: no acute rashes, no significant lesions EYES: conjunctiva are pink and  non-injected, sclera anicteric OROPHARYNX: MMM, no exudates, no oropharyngeal erythema or ulceration NECK: supple, no JVD LYMPH:  no palpable lymphadenopathy in the cervical, axillary or inguinal regions LUNGS: clear to auscultation b/l with normal respiratory effort HEART: regular rate & rhythm ABDOMEN:  normoactive bowel sounds , non tender, not distended. Extremity: no pedal edema PSYCH: alert & oriented x 3 with fluent speech NEURO: no focal motor/sensory deficits  LABORATORY DATA:  I have reviewed the data as listed  . CBC Latest Ref Rng & Units 10/14/2020 09/27/2020 07/22/2020  WBC 4.0 - 10.5 K/uL 4.5 4.8 4.8  Hemoglobin 12.0 - 15.0 g/dL 12.0 12.5 12.7  Hematocrit 36.0 - 46.0 % 38.9 38.1 39.2  Platelets 150 - 400 K/uL 279 208.0 223    . CMP Latest Ref Rng & Units 10/14/2020 09/27/2020 07/22/2020  Glucose 70 - 99 mg/dL 97 96 99  BUN 8 - 23 mg/dL 17 18 20   Creatinine 0.44 - 1.00 mg/dL 1.06(H) 0.99 1.14(H)  Sodium 135 - 145 mmol/L 144 139 139  Potassium 3.5 - 5.1 mmol/L 4.0 3.9 4.0  Chloride 98 - 111 mmol/L 107 104 107  CO2 22 - 32 mmol/L 23 27 24   Calcium 8.9 - 10.3 mg/dL 9.6 10.0 9.8  Total Protein 6.5 - 8.1 g/dL 7.4 6.5 7.2  Total Bilirubin 0.3 - 1.2 mg/dL 0.3 0.4 0.5  Alkaline Phos 38 - 126 U/L 86 84 86  AST 15 - 41 U/L 20 20 21   ALT 0 - 44 U/L 22 21 19     09/22/18 Right Axillary LN Biopsy:    07/31/18 Molecular Pathology:     02/21/16 Biopsy:    RADIOGRAPHIC STUDIES: I have personally reviewed the radiological images as listed and agreed with the findings in the report. CT CHEST ABDOMEN PELVIS W CONTRAST  Result Date: 10/07/2020 CLINICAL DATA:  Chronic lymphocytic leukemia. Chemotherapy in progress. EXAM: CT CHEST, ABDOMEN, AND PELVIS WITH CONTRAST TECHNIQUE: Multidetector CT imaging of the chest, abdomen and pelvis was performed following the standard protocol during bolus administration of intravenous contrast. CONTRAST:  147mL OMNIPAQUE IOHEXOL 300 MG/ML  SOLN  COMPARISON:  CT 03/23/2020 FINDINGS: CT CHEST FINDINGS Cardiovascular: No significant vascular findings. Normal heart size. No pericardial effusion. Mediastinum/Nodes: A prominent bilateral axillary lymph nodes again  noted lymph nodes are similar to slightly decreased in size. For example RIGHT axial lymph node measuring 11 mm (image 10/series 2) compares to 14 mm. LEFT axillary node along the margin of the pectoralis muscle measures 6 mm (image 17/2 compared to 8 mm. No mediastinal lymphadenopathy or hilar lymphadenopathy. Lungs/Pleura: No suspicious pulmonary nodularity. Mild linear atelectasis in the lingula. Musculoskeletal: No aggressive osseous lesion. CT ABDOMEN AND PELVIS FINDINGS Hepatobiliary: No focal hepatic lesion. No biliary ductal dilatation. Gallbladder is normal. Common bile duct is normal. Pancreas: Pancreas is normal. No ductal dilatation. No pancreatic inflammation. Spleen: Normal spleen Adrenals/urinary tract: Adrenal glands normal. Malrotation of the RIGHT kidney. No obstruction. Ureters and bladder normal. Stomach/Bowel: Stomach, small bowel, appendix, and cecum are normal. The colon and rectosigmoid colon are normal. Vascular/Lymphatic: Intimal calcification of the aorta. No periaortic lymphadenopathy. Reduction size iliac lymphadenopathy. For example RIGHT external iliac example RIGHT operator node measures 12 mm (image 95/2 compared to 21 mm. Is LEFT operator node measures 7 mm compared with 11 mm. No new adenopathy. Reproductive: Post hysterectomy.  Adnexa unremarkable Other: No peritoneal disease Musculoskeletal: No aggressive osseous lesion. Degenerative osteophytosis of the spine. IMPRESSION: Chest Impression: 1. Decrease in size of prominent axillary lymph nodes. 2. No mediastinal lymphadenopathy. 3. No evidence of disease progression. Abdomen / Pelvis Impression: 1. Reduction in volume is pelvic iliac adenopathy. 2. No new adenopathy in the abdomen pelvis. 3. Normal spleen.  Electronically Signed   By: Suzy Bouchard M.D.   On: 10/07/2020 13:13    ASSESSMENT & PLAN:   75 y.o. female with  1. Chronic Lymphocytic Leukemia  02/21/16 Right Axillary LN biopsy revealed SLL, the initial diagnosis. 07/31/18 FISH CLL Prognostic Panel revealed:52.0% cells with 11q deletion Labs upon initial presentation from 09/22/18, WBC at 16.8k, HGB at 10.9, PLT at 272k. ANC at 1.2k, Lymphs at 11.3k, Monocytes at 4.1k. 09/22/18 Right Axillary LN Biopsy revealed SLL, ruling out a transformation event  08/15/18 PET/CT revealed Bulky lymphadenopathy in the neck, chest, abdomen, and pelvis demonstrating low level hypermetabolism throughout. 9 mm right thyroid nodule is hypermetabolic. Thyroid ultrasound recommended to further evaluate. Mucosal thickening with air-fluid level in the left maxillary sinus. 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  08/12/2019 PET/CT scan ([id number]) revealed disease progression in neck, chest, abdomen, pelvis and interval development of pulmonary lesions  03/23/2020 CT C/A/P ([id number]) ([id number]) revealed. "1. Significant positive response to therapy. No new or progressive disease." 2. At risk for tumor lysis syndrome -- no evidence of uncontrolled TLS at this time.  PLAN: -Discussed pt labwork today,10/14/2020; blood counts completely normal, chemistries stable. -Discussed pt CT C/A/P on 10/07/2020; everything looks good. Most lymph nodes resolved. -No clinical or lab evidence of CLL progression at this time -The pt has no prohibitive toxicities from continuing 200 mg Venetoclax daily at this time.  -Advised pt she can now stop the Venetoclax at the end of this months prescription. -Recommended pt receive second booster for COVID. She can get it anytime as her last booster was 03/24/2020. -Discussed Evusheld and pt's eligibility. Will send referral. -Will see back in 4 months with labs.    FOLLOW UP: Ambulatory referral for  Evusheld RTC with [name] with labs in 4 months    The total time spent in the appointment was 20 minutes and more than 50% was on counseling and direct patient cares.   All of the patient's questions were answered with apparent satisfaction. The patient knows to call the clinic with any problems,  questions or concerns.   Sullivan Lone MD [place] AAHIVMS [facility] Hematology/Oncology Physician [facility]  (Office):       [phone] (Work cell):  [phone] (Fax):           [phone]  10/14/2020 1:09 PM  I, Reinaldo Raddle, am acting as scribe for Dr. Sullivan Lone, MD.     .I have reviewed the above documentation for accuracy and completeness, and I agree with the above. Brunetta Genera MD

## 2020-10-14 ENCOUNTER — Other Ambulatory Visit: Payer: Self-pay

## 2020-10-14 ENCOUNTER — Inpatient Hospital Stay: Payer: Medicare PPO | Attending: Hematology

## 2020-10-14 ENCOUNTER — Inpatient Hospital Stay: Payer: Medicare PPO | Admitting: Hematology

## 2020-10-14 VITALS — BP 149/72 | HR 76 | Temp 97.7°F | Resp 18 | Ht 61.0 in | Wt 173.2 lb

## 2020-10-14 DIAGNOSIS — C9110 Chronic lymphocytic leukemia of B-cell type not having achieved remission: Secondary | ICD-10-CM

## 2020-10-14 LAB — CMP (CANCER CENTER ONLY)
ALT: 22 U/L (ref 0–44)
AST: 20 U/L (ref 15–41)
Albumin: 4.1 g/dL (ref 3.5–5.0)
Alkaline Phosphatase: 86 U/L (ref 38–126)
Anion gap: 14 (ref 5–15)
BUN: 17 mg/dL (ref 8–23)
CO2: 23 mmol/L (ref 22–32)
Calcium: 9.6 mg/dL (ref 8.9–10.3)
Chloride: 107 mmol/L (ref 98–111)
Creatinine: 1.06 mg/dL — ABNORMAL HIGH (ref 0.44–1.00)
GFR, Estimated: 55 mL/min — ABNORMAL LOW (ref >60)
Glucose, Bld: 97 mg/dL (ref 70–99)
Potassium: 4 mmol/L (ref 3.5–5.1)
Sodium: 144 mmol/L (ref 135–145)
Total Bilirubin: 0.3 mg/dL (ref 0.3–1.2)
Total Protein: 7.4 g/dL (ref 6.5–8.1)

## 2020-10-14 LAB — CBC WITH DIFFERENTIAL/PLATELET
Abs Immature Granulocytes: 0.04 10*3/uL (ref 0.00–0.07)
Basophils Absolute: 0 10*3/uL (ref 0.0–0.1)
Basophils Relative: 0 %
Eosinophils Absolute: 0 10*3/uL (ref 0.0–0.5)
Eosinophils Relative: 1 %
HCT: 38.9 % (ref 36.0–46.0)
Hemoglobin: 12 g/dL (ref 12.0–15.0)
Immature Granulocytes: 1 %
Lymphocytes Relative: 52 %
Lymphs Abs: 2.3 10*3/uL (ref 0.7–4.0)
MCH: 26.8 pg (ref 26.0–34.0)
MCHC: 30.8 g/dL (ref 30.0–36.0)
MCV: 87 fL (ref 80.0–100.0)
Monocytes Absolute: 0.3 10*3/uL (ref 0.1–1.0)
Monocytes Relative: 7 %
Neutro Abs: 1.8 10*3/uL (ref 1.7–7.7)
Neutrophils Relative %: 39 %
Platelets: 279 10*3/uL (ref 150–400)
RBC: 4.47 MIL/uL (ref 3.87–5.11)
RDW: 13.3 % (ref 11.5–15.5)
WBC: 4.5 10*3/uL (ref 4.0–10.5)
nRBC: 0 % (ref 0.0–0.2)

## 2020-10-14 LAB — LACTATE DEHYDROGENASE: LDH: 308 U/L — ABNORMAL HIGH (ref 98–192)

## 2020-10-16 ENCOUNTER — Telehealth: Payer: Self-pay | Admitting: Physician Assistant

## 2020-10-16 NOTE — Telephone Encounter (Signed)
Called pt about Evusheld (tixagevimab co-packaged with cilgavimab) for pre-exposure prophylaxis for prevention of coronavirus disease 2019 (COVID-19) caused by the SARS-CoV-2 virus. The patient is a candidate for this therapy given increased risk for severe disease caused by immunosuppression.    Pt declines treatment for now. She will call us back or let Dr. Irene Limbo know if she changes her mind in the future  Angelena Form PA-C  [facility]

## 2020-10-28 ENCOUNTER — Other Ambulatory Visit (HOSPITAL_COMMUNITY): Payer: Self-pay

## 2021-02-09 NOTE — Progress Notes (Signed)
HEMATOLOGY/ONCOLOGY CLINIC NOTE  Date of Service: .02/10/2021   Patient Care Team: Tower, Wynelle Fanny, MD as PCP - General (Family Medicine) Galvin Proffer, [place] as Consulting Physician (Optometry) Brunetta Genera, MD as Consulting Physician (Hematology)  CHIEF COMPLAINTS/PURPOSE OF CONSULTATION:  Chronic Lymphocytic Leukemia  HISTORY OF PRESENTING ILLNESS:   Erica Carlson is a wonderful 75 y.o. female who has been referred to [place] by my colleague Dr. 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 The pt reports that she is doing well overall.   The pt reports that her SLL was initially picked up tthrough a mammogram in 2017. She notes that her CLL/SLL was stable until December 2019. She notes that she began having feelings of indigestion when she eats. She denies issues with acid reflux. The pt notes that her indigestion began making it difficult to eat, but also endorses a weakened appetite. She lost about 25 pounds, but has gained back 5 pounds. The pt had an unrevealing endoscopy on 07/18/18. She notes that she has some difficulty eating meats. She is staying well hydrated. She has experimented with eating different foods. She endorses having a "lot of gas," and puts a teaspoon of her Mylanta in her coffee every morning.  The pt denies any fevers, chills, or night sweats. She has noticed some lumps in her bilateral neck and armpits. She denies other abdominal pains as such. She denies problems passing urine, nor increased urinary frequency. She denies CP or SOB. She denies leg swelling.   Denies liver, kidney, heart, or lung problems and endorses being generally pretty healthy.  Of note prior to the patient's visit today, pt has had a PET/CT completed on 08/15/18 with results revealing Bulky lymphadenopathy in the neck, chest, abdomen, and pelvis demonstrating low level hypermetabolism throughout. 9 mm right thyroid nodule is hypermetabolic.  Thyroid ultrasound recommended to further evaluate. Mucosal thickening with air-fluid level in the left maxillary sinus. 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  Most recent lab results (09/22/18) of CBC w/diff and BMP is as follows: all values are WNL except for WBC at 16.8k, HGB at 10.9, MCHC at 29.7, ANC at 1.2k, Lymphs abs at 11.3k, Monocytes abs at 4.1k, Creatinine at 1.06.  On review of systems, pt reports frequent indigestion, weakened appetite, weight loss, weaker energy levels, bilateral neck bumps, bilateral armpit bumps, and denies CP, SOB, abdominal pain, leg swelling, fevers, chills, night sweats, lower abdominal pains, changes in bowel habits, and any other symptoms.  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 The patient's last visit with [place] was on 10/14/2020. The pt reports that she is doing well overall.  The pt reports no acute new symptoms. No constitutional symptoms. No new lumps or bumps. Happy about upcoming nuptials. [place] declined after counseling  Lab results today 02/10/2021 of CBC w/diff and CMP are unremarkable. LDH remains elevated and does not appear to corelate with her CLL acitivity.  On review of systems, pt reports no other symptoms.   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  Pneumonia     SURGICAL HISTORY: Past Surgical History:  Procedure Laterality Date   ABDOMINAL HYSTERECTOMY     AXILLARY LYMPH NODE BIOPSY Right 02/07/2016   back injections     BREAST SURGERY     COLONOSCOPY  2009   EYE SURGERY     retinal tear repair   KNEE SURGERY     torn ALC per pt    SOCIAL HISTORY: Social  History   Socioeconomic History   Marital status: Widowed    Spouse name: n/a   Number of children: 6   Years of education: 14   Highest education level: Not on file  Occupational History   Occupation: self-employed    Employer: IT trainer    Comment: alterations and design  Tobacco Use   Smoking status: Never   Smokeless tobacco: Never  Vaping Use   Vaping Use: Never used  Substance and Sexual Activity   Alcohol use: No    Alcohol/week: 0.0 standard drinks   Drug use: No   Sexual activity: Never  Other Topics Concern   Not on file  Social History Narrative   Lives alone.   Her adult children live nearby.   Social Determinants of Health   Financial Resource Strain: Low Risk    Difficulty of Paying Living Expenses: Not hard at all  Food Insecurity: No Food Insecurity   Worried About Charity fundraiser in the Last Year: Never true   [place] in the Last Year: Never true  Transportation Needs: No Transportation Needs   Lack of Transportation (Medical): No   Lack of Transportation (Non-Medical): No  Physical Activity: Inactive   Days of Exercise per Week: 0 days   Minutes of Exercise per Session: 0 min  Stress: No Stress Concern Present   Feeling of Stress : Not at all  Social Connections: Not on file  Intimate Partner Violence: Not At Risk   Fear of Current or Ex-Partner: No   Emotionally Abused: No   Physically Abused: No   Sexually Abused: No    FAMILY HISTORY: Family History  Problem Relation Age of Onset   Breast cancer Sister    Hyperlipidemia Son    Schizophrenia Mother    Colon cancer Neg Hx    Colon polyps Neg Hx    Esophageal cancer Neg Hx    Rectal cancer Neg Hx    Stomach cancer Neg Hx     ALLERGIES:  is allergic to hctz [hydrochlorothiazide] and lipitor [atorvastatin].  MEDICATIONS:  Current Outpatient Medications  Medication Sig Dispense Refill   amLODipine (NORVASC) 10 MG tablet TAKE 1 TABLET(10 MG) BY MOUTH DAILY 90  tablet 3   Cholecalciferol (VITAMIN D-3) 5000 units TABS Take 1 tablet by mouth daily.     diphenhydrAMINE (BENADRYL) 25 mg capsule Take 25 mg by mouth every 6 (six) hours as needed for itching or allergies.     lisinopril (ZESTRIL) 10 MG tablet Take 0.5 tablets (5 mg total) by mouth daily. (Patient not taking: Reported on 10/14/2020) 45 tablet 3   Multiple Vitamins-Minerals (PRESERVISION AREDS 2 PO) Take by mouth.     polyethylene glycol (MIRALAX / GLYCOLAX) 17 g packet Take 17 g by mouth daily.     rosuvastatin (CRESTOR) 10 MG tablet Take 1 tablet (10 mg total) by mouth daily. 90 tablet 3   No current facility-administered medications for this visit.    REVIEW OF SYSTEMS:   10 Point review of Systems was done  is negative except as noted above.  PHYSICAL EXAMINATION: ECOG FS:1 - Symptomatic but completely ambulatory  Vitals:   02/10/21 1203  BP: (!) 174/84  Pulse: 64  Resp: 17  Temp: 98.4 F (36.9 C)  SpO2: 100%    Wt Readings from Last 3 Encounters:  10/14/20 173 lb 3.2 oz (78.6 kg)  10/04/20 172 lb 4 oz (78.1 kg)  07/22/20 166 lb 12.8 oz (75.7 kg)   Body mass index is 32.97 kg/m.   NAD GENERAL:alert, in no acute distress and comfortable SKIN: no acute rashes, no significant lesions EYES: conjunctiva are pink and non-injected, sclera anicteric OROPHARYNX: MMM, no exudates, no oropharyngeal erythema or ulceration NECK: supple, no JVD LYMPH:  no palpable lymphadenopathy in the cervical, axillary or inguinal regions LUNGS: clear to auscultation b/l with normal respiratory effort HEART: regular rate & rhythm ABDOMEN:  normoactive bowel sounds , non tender, not distended. Extremity: no pedal edema PSYCH: alert & oriented x 3 with fluent speech NEURO: no focal motor/sensory deficits  LABORATORY DATA:  I have reviewed the data as listed  . CBC Latest Ref Rng & Units 02/10/2021 10/14/2020 09/27/2020  WBC 4.0 - 10.5 K/uL 5.1 4.5 4.8  Hemoglobin 12.0 - 15.0 g/dL 11.9(L) 12.0  12.5  Hematocrit 36.0 - 46.0 % 38.2 38.9 38.1  Platelets 150 - 400 K/uL 200 279 208.0    . CMP Latest Ref Rng & Units 02/10/2021 10/14/2020 09/27/2020  Glucose 70 - 99 mg/dL 92 97 96  BUN 8 - 23 mg/dL '23 17 18  '$ Creatinine 0.44 - 1.00 mg/dL 1.14(H) 1.06(H) 0.99  Sodium 135 - 145 mmol/L 140 144 139  Potassium 3.5 - 5.1 mmol/L 4.0 4.0 3.9  Chloride 98 - 111 mmol/L 109 107 104  CO2 22 - 32 mmol/L '22 23 27  '$ Calcium 8.9 - 10.3 mg/dL 9.5 9.6 10.0  Total Protein 6.5 - 8.1 g/dL 6.6 7.4 6.5  Total Bilirubin 0.3 - 1.2 mg/dL 0.3 0.3 0.4  Alkaline Phos 38 - 126 U/L 95 86 84  AST 15 - 41 U/L '17 20 20  '$ ALT 0 - 44 U/L '15 22 21    '$ 09/22/18 Right Axillary LN Biopsy:    07/31/18 Molecular Pathology:     02/21/16 Biopsy:    RADIOGRAPHIC STUDIES: I have personally reviewed the radiological images as listed and agreed with the findings in the report. No results found.   ASSESSMENT & PLAN:   75 y.o. female with  1. Chronic Lymphocytic Leukemia  02/21/16 Right Axillary LN biopsy revealed SLL, the initial diagnosis. 07/31/18 FISH CLL Prognostic Panel revealed:52.0% cells with 11q deletion Labs upon initial presentation from 09/22/18, WBC at 16.8k, HGB at 10.9, PLT at 272k. ANC at 1.2k, Lymphs at 11.3k, Monocytes at 4.1k. 09/22/18 Right Axillary LN Biopsy revealed SLL, ruling out a transformation event  08/15/18 PET/CT revealed Bulky lymphadenopathy in the neck, chest, abdomen, and pelvis demonstrating low level hypermetabolism throughout. 9 mm right thyroid nodule is hypermetabolic. Thyroid ultrasound recommended to further evaluate. Mucosal thickening with air-fluid level in the left maxillary sinus. 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  08/12/2019 PET/CT scan ([id number]) revealed disease progression in neck, chest, abdomen, pelvis and interval development of pulmonary lesions  03/23/2020 CT C/A/P ([id number]) ([id number]) revealed. "1. Significant positive response to  therapy. No new or progressive disease."   PLAN: -Discussed pt labwork today, 02/10/2021; ccb/diff, cmp -- unremarkable Ldh chronically elevated --does not appear to corelate with CLL activity or clinically evidence hemolysis. -No clinical or  lab evidence of CLL progression at this time -no indication for additional treatment of patients CLL at this time.  FOLLOW UP: RTC with [name] with labs in 4 months    The total time spent in the appointment was 20 minutes and more than 50% was on counseling and direct patient cares.   All of the patient's questions were answered with apparent satisfaction. The patient knows to call the clinic with any problems, questions or concerns.   Sullivan Lone MD [place] AAHIVMS [facility] Hematology/Oncology Physician [facility]  (Office):       [phone] (Work cell):  [phone] (Fax):           [phone]  02/09/2021 9:05 PM  I, Reinaldo Raddle, am acting as scribe for Dr. Sullivan Lone, MD.   .I have reviewed the above documentation for accuracy and completeness, and I agree with the above. Brunetta Genera MD

## 2021-02-10 ENCOUNTER — Inpatient Hospital Stay: Payer: Medicare PPO | Attending: Hematology

## 2021-02-10 ENCOUNTER — Other Ambulatory Visit: Payer: Self-pay

## 2021-02-10 ENCOUNTER — Inpatient Hospital Stay: Payer: Medicare PPO | Admitting: Hematology

## 2021-02-10 VITALS — BP 174/84 | HR 64 | Temp 98.4°F | Resp 17 | Wt 174.5 lb

## 2021-02-10 DIAGNOSIS — C9110 Chronic lymphocytic leukemia of B-cell type not having achieved remission: Secondary | ICD-10-CM | POA: Insufficient documentation

## 2021-02-10 LAB — CMP (CANCER CENTER ONLY)
ALT: 15 U/L (ref 0–44)
AST: 17 U/L (ref 15–41)
Albumin: 3.9 g/dL (ref 3.5–5.0)
Alkaline Phosphatase: 95 U/L (ref 38–126)
Anion gap: 9 (ref 5–15)
BUN: 23 mg/dL (ref 8–23)
CO2: 22 mmol/L (ref 22–32)
Calcium: 9.5 mg/dL (ref 8.9–10.3)
Chloride: 109 mmol/L (ref 98–111)
Creatinine: 1.14 mg/dL — ABNORMAL HIGH (ref 0.44–1.00)
GFR, Estimated: 51 mL/min — ABNORMAL LOW (ref >60)
Glucose, Bld: 92 mg/dL (ref 70–99)
Potassium: 4 mmol/L (ref 3.5–5.1)
Sodium: 140 mmol/L (ref 135–145)
Total Bilirubin: 0.3 mg/dL (ref 0.3–1.2)
Total Protein: 6.6 g/dL (ref 6.5–8.1)

## 2021-02-10 LAB — CBC WITH DIFFERENTIAL/PLATELET
Abs Immature Granulocytes: 0.08 10*3/uL — ABNORMAL HIGH (ref 0.00–0.07)
Basophils Absolute: 0 10*3/uL (ref 0.0–0.1)
Basophils Relative: 0 %
Eosinophils Absolute: 0.2 10*3/uL (ref 0.0–0.5)
Eosinophils Relative: 5 %
HCT: 38.2 % (ref 36.0–46.0)
Hemoglobin: 11.9 g/dL — ABNORMAL LOW (ref 12.0–15.0)
Immature Granulocytes: 2 %
Lymphocytes Relative: 52 %
Lymphs Abs: 2.7 10*3/uL (ref 0.7–4.0)
MCH: 26.8 pg (ref 26.0–34.0)
MCHC: 31.2 g/dL (ref 30.0–36.0)
MCV: 86 fL (ref 80.0–100.0)
Monocytes Absolute: 0.4 10*3/uL (ref 0.1–1.0)
Monocytes Relative: 7 %
Neutro Abs: 1.7 10*3/uL (ref 1.7–7.7)
Neutrophils Relative %: 34 %
Platelets: 200 10*3/uL (ref 150–400)
RBC: 4.44 MIL/uL (ref 3.87–5.11)
RDW: 13.3 % (ref 11.5–15.5)
WBC: 5.1 10*3/uL (ref 4.0–10.5)
nRBC: 0 % (ref 0.0–0.2)

## 2021-02-10 LAB — LACTATE DEHYDROGENASE: LDH: 378 U/L — ABNORMAL HIGH (ref 98–192)

## 2021-02-13 ENCOUNTER — Telehealth: Payer: Self-pay | Admitting: Hematology

## 2021-02-13 NOTE — Telephone Encounter (Signed)
Scheduled appts per 8/5 los. Pt aware.

## 2021-02-16 ENCOUNTER — Encounter: Payer: Self-pay | Admitting: Hematology

## 2021-03-20 ENCOUNTER — Ambulatory Visit: Payer: Medicare PPO | Admitting: Family Medicine

## 2021-03-21 ENCOUNTER — Encounter: Payer: Self-pay | Admitting: Family Medicine

## 2021-03-21 ENCOUNTER — Other Ambulatory Visit: Payer: Self-pay

## 2021-03-21 ENCOUNTER — Ambulatory Visit (INDEPENDENT_AMBULATORY_CARE_PROVIDER_SITE_OTHER)
Admission: RE | Admit: 2021-03-21 | Discharge: 2021-03-21 | Disposition: A | Discharge status: Home / Self Care | Payer: Medicare PPO | Source: Ambulatory Visit | Attending: Family Medicine | Admitting: Family Medicine

## 2021-03-21 ENCOUNTER — Ambulatory Visit: Payer: Medicare PPO | Admitting: Family Medicine

## 2021-03-21 DIAGNOSIS — M25562 Pain in left knee: Secondary | ICD-10-CM | POA: Insufficient documentation

## 2021-03-21 DIAGNOSIS — M25462 Effusion, left knee: Secondary | ICD-10-CM | POA: Diagnosis not present

## 2021-03-21 NOTE — Progress Notes (Signed)
Subjective:    Patient ID: Erica Carlson, female    DOB: [birth year], 75 y.o.   MRN: [id number]  This visit occurred during the SARS-CoV-2 public health emergency.  Safety protocols were in place, including screening questions prior to the visit, additional usage of staff PPE, and extensive cleaning of exam room while observing appropriate contact time as indicated for disinfecting solutions.   HPI Pt presents with knee pain   Wt Readings from Last 3 Encounters:  03/21/21 174 lb (78.9 kg)  02/10/21 174 lb 8 oz (79.2 kg)  10/14/20 173 lb 3.2 oz (78.6 kg)   32.88 kg/m  Left knee pain on/off 3 months  Occ both knees (R knee problem is old) Worse on Wednesday  Medial  Sharp and dull  It grabs her and suddenly she cannot walk on it  Like a catch  That lasts about 15-20 min when it happens Improves after rest  Worried about falling No swelling  No redness or warmth   No pain at night or at rest  Some stiffness  No popping or crackling  Avoids stairs/they bother her (both sides)   She rubs knee with alcohol  Tylenol does not help  No nsaids  Some ice-helps   She has h/o R knee OA  H/o knee surgery in R knee/tear of ACL That bothers her  She has sciatica on R side also  Gait is different -may have bothered the L knee  Also went back to the gym recently  Leg exercises , including bands and weight  Treadmill (not a lot) and some laps outside/fast walking  Low impact jumping jacks  Some squats   Knee xray 2013 DG Knee Bilateral Standing AP (Accession [id number]) (Order [id number]) Imaging Date: 10/17/2011 Department: [place] Released By: Ellamae Sia Authorizing: Owens Loffler, MD   Exam Status  Status  Final [99]   PACS Intelerad Image Link   Show images for DG Knee Bilateral Standing AP  Study Result  Narrative  *RADIOLOGY REPORT*   Clinical Data: Right knee pain.  Right knee osteoarthrosis.   BILATERAL KNEES STANDING  - 1 VIEW   Comparison: None.   Findings: Bilateral medial compartment predominant osteoarthritis  is present.  Marginal osteophytes are present bilaterally in the  medial compartment.  Lateral compartment joint space is preserved.  No destructive osseous lesions identified.  Mild varus deformity of  the right knee.   IMPRESSION:  Bilateral right greater than left medial compartment mild to  moderate osteoarthritis.   Original Report Authenticated By: Dereck Ligas, M.D.   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   Encounter for Medicare annual wellness exam 09/30/2013   Estrogen deficiency 09/17/2012   Herpes zoster without complication 123456   Special screening for malignant neoplasms, colon 12/15/2010   Other screening mammogram 12/15/2010   Routine general medical examination at a health care facility 12/08/2010   Low back pain 09/06/2010   OSTEOARTHRITIS, KNEE, RIGHT 10/11/2008   Internal hemorrhoids 09/02/2008   Hypertension 04/28/2008   PNEUMONIA, HX OF 03/03/2008   Hyperlipidemia 02/26/2008   Past Medical History:  Diagnosis Date   Allergy    spring   Arthritis  osteo arthritis of right knee   Cataract    forming   Hyperlipidemia    Hypertension    Lymphoma, 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    Pneumonia    Past Surgical History:  Procedure Laterality Date   ABDOMINAL HYSTERECTOMY     AXILLARY LYMPH NODE BIOPSY Right 02/07/2016   back injections     BREAST SURGERY     COLONOSCOPY  2009   EYE SURGERY     retinal tear repair   KNEE SURGERY     torn ALC per pt   Social History   Tobacco Use   Smoking status: Never   Smokeless tobacco: Never  Vaping Use    Vaping Use: Never used  Substance Use Topics   Alcohol use: No    Alcohol/week: 0.0 standard drinks   Drug use: No   Family History  Problem Relation Age of Onset   Breast cancer Sister    Hyperlipidemia Son    Schizophrenia Mother    Colon cancer Neg Hx    Colon polyps Neg Hx    Esophageal cancer Neg Hx    Rectal cancer Neg Hx    Stomach cancer Neg Hx    Allergies  Allergen Reactions   Hctz [Hydrochlorothiazide] Nausea And Vomiting    Nausea and vomiting    Lipitor [Atorvastatin] Other (See Comments)    Ache in back and legs   Current Outpatient Medications on File Prior to Visit  Medication Sig Dispense Refill   amLODipine (NORVASC) 10 MG tablet TAKE 1 TABLET(10 MG) BY MOUTH DAILY 90 tablet 3   Cholecalciferol (VITAMIN D-3) 5000 units TABS Take 1 tablet by mouth daily.     diphenhydrAMINE (BENADRYL) 25 mg capsule Take 25 mg by mouth every 6 (six) hours as needed for itching or allergies.     lisinopril (ZESTRIL) 10 MG tablet Take 0.5 tablets (5 mg total) by mouth daily. 45 tablet 3   Multiple Vitamins-Minerals (PRESERVISION AREDS 2 PO) Take by mouth.     polyethylene glycol (MIRALAX / GLYCOLAX) 17 g packet Take 17 g by mouth daily.     No current facility-administered medications on file prior to visit.    Review of Systems  Constitutional:  Negative for activity change, appetite change, fatigue, fever and unexpected weight change.  HENT:  Negative for congestion, ear pain, rhinorrhea, sinus pressure and sore throat.   Eyes:  Negative for pain, redness and visual disturbance.  Respiratory:  Negative for cough, shortness of breath and wheezing.   Cardiovascular:  Negative for chest pain and palpitations.  Gastrointestinal:  Negative for abdominal pain, blood in stool, constipation and diarrhea.  Endocrine: Negative for polydipsia and polyuria.  Genitourinary:  Negative for dysuria, frequency and urgency.  Musculoskeletal:  Positive for arthralgias. Negative for back pain  and myalgias.       Knee pain worse on the L  Skin:  Negative for pallor and rash.  Allergic/Immunologic: Negative for environmental allergies.  Neurological:  Negative for dizziness, syncope and headaches.  Hematological:  Negative for adenopathy. Does not bruise/bleed easily.  Psychiatric/Behavioral:  Negative for decreased concentration and dysphoric mood. The patient is not nervous/anxious.       Objective:   Physical Exam Constitutional:      General: She is not in acute distress.    Appearance: Normal appearance. She is obese. She is not ill-appearing.  Eyes:     Conjunctiva/sclera: Conjunctivae normal.     Pupils: Pupils are equal, round,  and reactive to light.  Cardiovascular:     Rate and Rhythm: Normal rate and regular rhythm.     Pulses: Normal pulses.  Pulmonary:     Effort: Pulmonary effort is normal. No respiratory distress.     Breath sounds: Normal breath sounds.  Musculoskeletal:     Cervical back: Neck supple.     Right knee: No swelling, effusion or ecchymosis. Normal range of motion. No tenderness. No medial joint line tenderness.     Left knee: No swelling, deformity, effusion, erythema, ecchymosis, bony tenderness or crepitus. Normal range of motion. Tenderness present over the medial joint line. No lateral joint line or patellar tendon tenderness. Normal alignment, normal meniscus and normal patellar mobility. Normal pulse.     Instability Tests: Anterior drawer test negative. Posterior drawer test negative. Anterior Lachman test negative. Medial McMurray test negative and lateral McMurray test negative.     Right lower leg: No edema.     Left lower leg: No edema.     Comments: L knee: Nl mc murray exam  Nl rom  Was stiff after sitting  No warmth or skin change   Lymphadenopathy:     Cervical: No cervical adenopathy.  Skin:    Findings: No bruising, erythema or rash.  Neurological:     Mental Status: She is alert.     Sensory: No sensory deficit.      Motor: No weakness.  Psychiatric:        Mood and Affect: Mood normal.          Assessment & Plan:   Problem List Items Addressed This Visit       Other   Left knee pain    Medial and dull /at other times a sharp catching sensation  H/o ACL repair in other knee H/o OA bilat in the past  Xray today  Adv trial of voltaren gel qid topically Ice prn Compression prn  Avoid high impact activities/favor exercise bike Handout given Suspect OA plus/minus strain      Relevant Orders   DG Knee 4 Views W/Patella Left

## 2021-03-21 NOTE — Patient Instructions (Addendum)
Keep using ice when you can  10 minutes at a time  Elevate when you can   A compression brace or sleeve during the day is fine if it helps  Get voltaren gel 1% over the counter (store brand is fine) and use it up to 4 times daily  Tylenol is ok also   We will get xray report back and make a plan  Avoid high impact exercise   The exercise bike is a good option if it does not hurt to use

## 2021-03-21 NOTE — Assessment & Plan Note (Signed)
Medial and dull /at other times a sharp catching sensation  H/o ACL repair in other knee H/o OA bilat in the past  Xray today  Adv trial of voltaren gel qid topically Ice prn Compression prn  Avoid high impact activities/favor exercise bike Handout given Suspect OA plus/minus strain

## 2021-03-23 DIAGNOSIS — H43813 Vitreous degeneration, bilateral: Secondary | ICD-10-CM | POA: Diagnosis not present

## 2021-03-23 DIAGNOSIS — H33311 Horseshoe tear of retina without detachment, right eye: Secondary | ICD-10-CM | POA: Diagnosis not present

## 2021-03-23 DIAGNOSIS — H2513 Age-related nuclear cataract, bilateral: Secondary | ICD-10-CM | POA: Diagnosis not present

## 2021-03-27 ENCOUNTER — Telehealth: Payer: Self-pay | Admitting: Family Medicine

## 2021-03-27 DIAGNOSIS — M25562 Pain in left knee: Secondary | ICD-10-CM

## 2021-03-27 NOTE — Telephone Encounter (Signed)
-----   Message from Tammi Sou, [place] sent at 03/27/2021  3:17 PM EDT ----- Pt notified of xray results and Dr. Marliss Coots comments. Pt does want to proceed with Ortho referral. She would like to see someone in [place] if possible. I advised pt PCP will put referral in and our [facility] or the ortho office will call her to get appt scheduled

## 2021-04-03 DIAGNOSIS — H40013 Open angle with borderline findings, low risk, bilateral: Secondary | ICD-10-CM | POA: Diagnosis not present

## 2021-04-03 DIAGNOSIS — H2513 Age-related nuclear cataract, bilateral: Secondary | ICD-10-CM | POA: Diagnosis not present

## 2021-04-05 ENCOUNTER — Other Ambulatory Visit: Payer: Self-pay

## 2021-04-05 ENCOUNTER — Ambulatory Visit: Payer: Medicare PPO | Admitting: Orthopaedic Surgery

## 2021-04-05 ENCOUNTER — Encounter: Payer: Self-pay | Admitting: Orthopaedic Surgery

## 2021-04-05 VITALS — Ht 61.0 in | Wt 172.0 lb

## 2021-04-05 DIAGNOSIS — M1711 Unilateral primary osteoarthritis, right knee: Secondary | ICD-10-CM | POA: Diagnosis not present

## 2021-04-05 DIAGNOSIS — M1712 Unilateral primary osteoarthritis, left knee: Secondary | ICD-10-CM

## 2021-04-05 DIAGNOSIS — M170 Bilateral primary osteoarthritis of knee: Secondary | ICD-10-CM | POA: Diagnosis not present

## 2021-04-05 NOTE — Progress Notes (Signed)
Office Visit Note   Patient: Erica Carlson           Date of Birth: 08/03/1945           MRN: [id number] Visit Date: 04/05/2021              Requested by: Tower, Erica Fanny, MD [place],  [place] [address] PCP: Abner Greenspan, MD   Assessment & Plan: Visit Diagnoses:  1. Primary osteoarthritis of right knee   2. Primary osteoarthritis of left knee     Plan: Impression is bilateral knee osteoarthritis.  Overall pain is well controlled.  She will continue with Voltaren gel and oral medications as needed.  Currently not interested in any injections.  She has my card if she needs anything.  Otherwise follow-up as needed.  Follow-Up Instructions: Return if symptoms worsen or fail to improve.   Orders:  No orders of the defined types were placed in this encounter.  No orders of the defined types were placed in this encounter.     Procedures: No procedures performed   Clinical Data: No additional findings.   Subjective: Chief Complaint  Patient presents with   Left Knee - New Patient (Initial Visit)    HPI  [name] is a 75 year old female who comes in for evaluation of bilateral knee pain due to known OA.  Her left knee has actually stopped hurting.  The original referral was for the knee pain.  She has had a prior right knee arthroscopy for torn medial meniscus.  She feels that she has increased pain when she is been walking and standing on it.  She denies any swelling or mechanical symptoms.  Review of Systems   Objective: Vital Signs: Ht 5\' 1"  (1.549 m)   Wt 172 lb (78 kg)   BMI 32.50 kg/m   Physical Exam  Ortho Exam  Right knee examination shows trace effusion.  Mild patellofemoral crepitus with range of motion.  Collaterals and cruciates are stable.  Left knee exam is unremarkable.  Specialty Comments:  No specialty comments available.  Imaging: No results found.   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   Encounter for Medicare annual wellness exam 09/30/2013   Estrogen deficiency 09/17/2012   Herpes zoster without complication 77/82/4235   Special screening for malignant neoplasms, colon 12/15/2010   Other screening mammogram 12/15/2010   Routine general medical examination at a health care facility 12/08/2010   Low back pain 09/06/2010   OSTEOARTHRITIS, KNEE, RIGHT 10/11/2008   Internal hemorrhoids 09/02/2008   Hypertension 04/28/2008   PNEUMONIA, HX OF 03/03/2008   Hyperlipidemia 02/26/2008   Past Medical History:  Diagnosis Date   Allergy    spring   Arthritis    osteo arthritis of right knee   Cataract    forming   Hyperlipidemia    Hypertension    Lymphoma, 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    Pneumonia     Family History  Problem Relation Age of Onset   Breast cancer Sister    Hyperlipidemia Son    Schizophrenia Mother    Colon cancer Neg Hx    Colon polyps Neg  Hx    Esophageal cancer Neg Hx    Rectal cancer Neg Hx    Stomach cancer Neg Hx     Past Surgical History:  Procedure Laterality Date   ABDOMINAL HYSTERECTOMY     AXILLARY LYMPH NODE BIOPSY Right 02/07/2016   back injections     BREAST SURGERY     COLONOSCOPY  2009   EYE SURGERY     retinal tear repair   KNEE SURGERY     torn ALC per pt   Social History   Occupational History   Occupation: self-employed    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

## 2021-05-11 DIAGNOSIS — H2512 Age-related nuclear cataract, left eye: Secondary | ICD-10-CM | POA: Diagnosis not present

## 2021-05-11 DIAGNOSIS — H52222 Regular astigmatism, left eye: Secondary | ICD-10-CM | POA: Diagnosis not present

## 2021-05-12 DIAGNOSIS — H2511 Age-related nuclear cataract, right eye: Secondary | ICD-10-CM | POA: Diagnosis not present

## 2021-05-25 DIAGNOSIS — H2511 Age-related nuclear cataract, right eye: Secondary | ICD-10-CM | POA: Diagnosis not present

## 2021-06-09 ENCOUNTER — Other Ambulatory Visit: Payer: Self-pay

## 2021-06-09 ENCOUNTER — Ambulatory Visit (INDEPENDENT_AMBULATORY_CARE_PROVIDER_SITE_OTHER): Payer: Medicare PPO

## 2021-06-09 DIAGNOSIS — Z23 Encounter for immunization: Secondary | ICD-10-CM | POA: Diagnosis not present

## 2021-06-12 ENCOUNTER — Telehealth: Payer: Self-pay | Admitting: Hematology

## 2021-06-12 NOTE — Telephone Encounter (Signed)
Scheduled per sch msg. Called and spoke with patient. Confirmed appt. Will call back if test results for covid are positive

## 2021-06-13 ENCOUNTER — Other Ambulatory Visit: Payer: Medicare PPO

## 2021-06-13 ENCOUNTER — Ambulatory Visit: Payer: Medicare PPO | Admitting: Hematology

## 2021-06-14 ENCOUNTER — Other Ambulatory Visit: Payer: Self-pay

## 2021-06-14 ENCOUNTER — Encounter: Payer: Self-pay | Admitting: Family Medicine

## 2021-06-14 ENCOUNTER — Telehealth (INDEPENDENT_AMBULATORY_CARE_PROVIDER_SITE_OTHER): Payer: Medicare PPO | Admitting: Family Medicine

## 2021-06-14 DIAGNOSIS — U071 COVID-19: Secondary | ICD-10-CM | POA: Diagnosis not present

## 2021-06-14 MED ORDER — MOLNUPIRAVIR EUA 200MG CAPSULE: 4.0000 | ORAL_CAPSULE | Freq: Two times a day (BID) | ORAL | 0 refills | Status: AC

## 2021-06-14 NOTE — Patient Instructions (Signed)
Drink fluids and rest  Try delsym for the cough over the counter Continue tylenol as needed for pain or fever   Take the molnupiravir as directed for covid  If any side effects or problems let us know   If symptoms become severe or you have trouble breathing please go to the ER  Update if not starting to improve in a week or if worsening   Continue to isolate until symptoms are better (a minimum of 5 days)

## 2021-06-14 NOTE — Progress Notes (Signed)
Virtual Visit via Video Note  I connected with [name] on 06/14/21 at 10:30 AM EST by a video enabled telemedicine application and verified that I am speaking with the correct person using two identifiers.  Location: Patient: home Provider: office   I discussed the limitations of evaluation and management by telemedicine and the availability of in person appointments. The patient expressed understanding and agreed to proceed.  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)  Started symptoms on Sunday  A little dizzy  Also diarrhea and nausea, not vomiting  Then a slight headache-top of her head  Sore throat  Body aches  No chills   Cough - is not too bad , makes throat sore  No phlegm , dry cough  No sob or wheezing  Not congested but runny nose  No appetite Taste/smell is fine  Covid test was positive on Monday   Temp 99.1 -max today   Had cataract surgery 2 wk ago   Otc: Tylenol  Drinking fluids -more than 64oz daily   Is covid vaccinated    Most recent onc labs were stable Lab Results  Component Value Date   WBC 5.1 02/10/2021   HGB 11.9 (L) 02/10/2021   HCT 38.2 02/10/2021   MCV 86.0 02/10/2021   PLT 200 02/10/2021   Lab Results  Component Value Date   CREATININE 1.14 (H) 02/10/2021   BUN 23 02/10/2021   NA 140 02/10/2021   K 4.0 02/10/2021   CL 109 02/10/2021   CO2 22 02/10/2021   Patient Active Problem List   Diagnosis Date Noted   Left knee pain 03/21/2021   Hand pain 10/04/2020   CLL (chronic lymphocytic leukemia) ([place]) 08/31/2019   Counseling regarding advance care planning and goals of care 08/31/2019   Need for hepatitis C screening test 06/26/2016   Lymphoma, 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   Encounter for Medicare  annual wellness exam 09/30/2013   Estrogen deficiency 09/17/2012   Herpes zoster without complication 58/85/0277   Special screening for malignant neoplasms, colon 12/15/2010   Other screening mammogram 12/15/2010   Routine general medical examination at a health care facility 12/08/2010   Low back pain 09/06/2010   OSTEOARTHRITIS, KNEE, RIGHT 10/11/2008   Internal hemorrhoids 09/02/2008   Hypertension 04/28/2008   PNEUMONIA, HX OF 03/03/2008   Hyperlipidemia 02/26/2008   Past Medical History:  Diagnosis Date   Allergy    spring   Arthritis    osteo arthritis of right knee   Cataract    forming   Hyperlipidemia    Hypertension    Lymphoma, small-cell ([place])    right axillary    Pneumonia    Past Surgical History:  Procedure Laterality Date   ABDOMINAL HYSTERECTOMY     AXILLARY LYMPH NODE BIOPSY Right 02/07/2016   back injections     BREAST SURGERY     COLONOSCOPY  2009   EYE SURGERY     retinal tear repair   KNEE SURGERY     torn [facility] per pt   Social History   Tobacco Use   Smoking status: Never   Smokeless tobacco: Never  Vaping Use   Vaping Use: Never used  Substance Use Topics   Alcohol use: No  Alcohol/week: 0.0 standard drinks   Drug use: No   Family History  Problem Relation Age of Onset   Breast cancer Sister    Hyperlipidemia Son    Schizophrenia Mother    Colon cancer Neg Hx    Colon polyps Neg Hx    Esophageal cancer Neg Hx    Rectal cancer Neg Hx    Stomach cancer Neg Hx    Allergies  Allergen Reactions   Hctz [Hydrochlorothiazide] Nausea And Vomiting    Nausea and vomiting    Lipitor [Atorvastatin] Other (See Comments)    Ache in back and legs   Current Outpatient Medications on File Prior to Visit  Medication Sig Dispense Refill   amLODipine (NORVASC) 10 MG tablet TAKE 1 TABLET(10 MG) BY MOUTH DAILY 90 tablet 3   Cholecalciferol (VITAMIN D-3) 5000 units TABS Take 1 tablet by mouth daily.     diphenhydrAMINE (BENADRYL) 25 mg capsule  Take 25 mg by mouth every 6 (six) hours as needed for itching or allergies.     lisinopril (ZESTRIL) 10 MG tablet Take 0.5 tablets (5 mg total) by mouth daily. 45 tablet 3   polyethylene glycol (MIRALAX / GLYCOLAX) 17 g packet Take 17 g by mouth daily as needed.     No current facility-administered medications on file prior to visit.   Review of Systems  Constitutional:  Positive for fever and malaise/fatigue. Negative for chills.  HENT:  Negative for congestion, ear pain, sinus pain and sore throat.   Eyes:  Negative for blurred vision, discharge and redness.  Respiratory:  Positive for cough. Negative for sputum production, shortness of breath, wheezing and stridor.   Cardiovascular:  Negative for chest pain, palpitations and leg swelling.  Gastrointestinal:  Negative for abdominal pain, diarrhea, nausea and vomiting.  Musculoskeletal:  Negative for myalgias.  Skin:  Negative for rash.  Neurological:  Positive for dizziness and headaches.     Observations/Objective: Patient appears well, in no distress Weight is baseline  No facial swelling or asymmetry Normal voice-not hoarse and no slurred speech No obvious tremor or mobility impairment Moving neck and UEs normally Able to hear the call well  No cough or shortness of breath during interview  Talkative and mentally sharp with no cognitive changes No skin changes on face or neck , no rash or pallor Affect is normal    Assessment and Plan: Problem List Items Addressed This Visit       Other   COVID-19    In pt with h/o CLL (not currently treated)  Mild to mod symptoms /no breathing difficulties  Discussed symptom care ER precautions rev  tx with molnupiravir in light of risk factors  Disc poss side eff  inst to call if any problems  Update if not starting to improve in a week or if worsening   inst to isolate until symptoms are better/min of 5 d       Relevant Medications   molnupiravir EUA (LAGEVRIO) 200 mg CAPS  capsule     Follow Up Instructions: Drink fluids and rest  Try delsym for the cough over the counter Continue tylenol as needed for pain or fever   Take the molnupiravir as directed for covid  If any side effects or problems let us know   If symptoms become severe or you have trouble breathing please go to the ER  Update if not starting to improve in a week or if worsening   Continue to isolate until symptoms are  better (a minimum of 5 days)    I discussed the assessment and treatment plan with the patient. The patient was provided an opportunity to ask questions and all were answered. The patient agreed with the plan and demonstrated an understanding of the instructions.   The patient was advised to call back or seek an in-person evaluation if the symptoms worsen or if the condition fails to improve as anticipated.  I provided 17 minutes of non-face-to-face time during this encounter.   Loura Pardon, MD

## 2021-06-14 NOTE — Assessment & Plan Note (Signed)
In pt with h/o CLL (not currently treated)  Mild to mod symptoms /no breathing difficulties  Discussed symptom care ER precautions rev  tx with molnupiravir in light of risk factors  Disc poss side eff  inst to call if any problems  Update if not starting to improve in a week or if worsening   inst to isolate until symptoms are better/min of 5 d

## 2021-06-15 ENCOUNTER — Other Ambulatory Visit: Payer: Medicare PPO

## 2021-06-15 ENCOUNTER — Ambulatory Visit: Payer: Medicare PPO | Admitting: Hematology

## 2021-07-06 ENCOUNTER — Inpatient Hospital Stay: Payer: Medicare PPO | Admitting: Hematology

## 2021-07-06 ENCOUNTER — Inpatient Hospital Stay: Payer: Medicare PPO | Attending: Hematology

## 2021-07-06 ENCOUNTER — Other Ambulatory Visit: Payer: Self-pay

## 2021-07-06 VITALS — BP 145/73 | HR 77 | Temp 97.9°F | Resp 15 | Wt 181.2 lb

## 2021-07-06 DIAGNOSIS — C9110 Chronic lymphocytic leukemia of B-cell type not having achieved remission: Secondary | ICD-10-CM

## 2021-07-06 LAB — CBC WITH DIFFERENTIAL/PLATELET
Abs Immature Granulocytes: 0.02 10*3/uL (ref 0.00–0.07)
Basophils Absolute: 0 10*3/uL (ref 0.0–0.1)
Basophils Relative: 0 %
Eosinophils Absolute: 0.3 10*3/uL (ref 0.0–0.5)
Eosinophils Relative: 5 %
HCT: 36.5 % (ref 36.0–46.0)
Hemoglobin: 11.3 g/dL — ABNORMAL LOW (ref 12.0–15.0)
Immature Granulocytes: 0 %
Lymphocytes Relative: 41 %
Lymphs Abs: 2.7 10*3/uL (ref 0.7–4.0)
MCH: 26.5 pg (ref 26.0–34.0)
MCHC: 31 g/dL (ref 30.0–36.0)
MCV: 85.5 fL (ref 80.0–100.0)
Monocytes Absolute: 0.6 10*3/uL (ref 0.1–1.0)
Monocytes Relative: 9 %
Neutro Abs: 2.9 10*3/uL (ref 1.7–7.7)
Neutrophils Relative %: 45 %
Platelets: 208 10*3/uL (ref 150–400)
RBC: 4.27 MIL/uL (ref 3.87–5.11)
RDW: 13.7 % (ref 11.5–15.5)
WBC: 6.5 10*3/uL (ref 4.0–10.5)
nRBC: 0 % (ref 0.0–0.2)

## 2021-07-06 LAB — CMP (CANCER CENTER ONLY)
ALT: 11 U/L (ref 0–44)
AST: 12 U/L — ABNORMAL LOW (ref 15–41)
Albumin: 4.1 g/dL (ref 3.5–5.0)
Alkaline Phosphatase: 78 U/L (ref 38–126)
Anion gap: 7 (ref 5–15)
BUN: 30 mg/dL — ABNORMAL HIGH (ref 8–23)
CO2: 25 mmol/L (ref 22–32)
Calcium: 9.3 mg/dL (ref 8.9–10.3)
Chloride: 107 mmol/L (ref 98–111)
Creatinine: 1.32 mg/dL — ABNORMAL HIGH (ref 0.44–1.00)
GFR, Estimated: 42 mL/min — ABNORMAL LOW (ref >60)
Glucose, Bld: 106 mg/dL — ABNORMAL HIGH (ref 70–99)
Potassium: 3.8 mmol/L (ref 3.5–5.1)
Sodium: 139 mmol/L (ref 135–145)
Total Bilirubin: 0.3 mg/dL (ref 0.3–1.2)
Total Protein: 6.9 g/dL (ref 6.5–8.1)

## 2021-07-06 LAB — LACTATE DEHYDROGENASE: LDH: 174 U/L (ref 98–192)

## 2021-07-07 ENCOUNTER — Telehealth: Payer: Self-pay | Admitting: Hematology

## 2021-07-07 NOTE — Telephone Encounter (Signed)
Scheduled follow-up appointment per 12/29 los. Patient is aware.

## 2021-07-12 ENCOUNTER — Encounter: Payer: Self-pay | Admitting: Hematology

## 2021-07-12 NOTE — Progress Notes (Signed)
HEMATOLOGY/ONCOLOGY CLINIC NOTE  Date of Service: .07/06/2021   Patient Care Team: Tower, Wynelle Fanny, MD as PCP - General (Family Medicine) Galvin Proffer, [place] as Consulting Physician (Optometry) Irene Limbo, Cloria [place], MD as Consulting Physician (Hematology)  CHIEF COMPLAINTS/PURPOSE OF CONSULTATION:  Follow-up for continued management of CLL  HISTORY OF PRESENTING ILLNESS:  Please see previous notes for details on initial presentation.  INTERVAL HISTORY:   Erica Carlson is here for follow-up for continued evaluation and management of her CLL/SLL. She notes no acute new symptoms since her last clinic visit.  No fevers no chills no night sweats.  No unexpected weight loss.  No new fatigue. No new lumps or bumps. No new abdominal pain or distention. Labs done today 07/06/2021 were reviewed with the patient in details.  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[place])    right axillary    Pneumonia     SURGICAL HISTORY: Past Surgical History:  Procedure Laterality Date   ABDOMINAL HYSTERECTOMY     AXILLARY LYMPH NODE BIOPSY Right 02/07/2016   back injections     BREAST SURGERY     COLONOSCOPY  2009   EYE SURGERY     retinal tear repair   KNEE SURGERY     torn [facility] per pt    SOCIAL HISTORY: Social History   Socioeconomic History   Marital status: Widowed    Spouse name: n/a   Number of children: 6   Years of education: 14   Highest education level: Not on file  Occupational History   Occupation: self-employed    Employer: IT trainer    Comment: alterations and design  Tobacco Use   Smoking status: Never   Smokeless tobacco: Never  Vaping Use   Vaping Use: Never used  Substance and Sexual Activity   Alcohol use: No    Alcohol/week: 0.0 standard drinks   Drug use: No    Sexual activity: Never  Other Topics Concern   Not on file  Social History Narrative   Lives alone.   Her adult children live nearby.   Social Determinants of Health   Financial Resource Strain: Low Risk    Difficulty of Paying Living Expenses: Not hard at all  Food Insecurity: No Food Insecurity   Worried About Charity fundraiser in the Last Year: Never true   [place] in the Last Year: Never true  Transportation Needs: No Transportation Needs   Lack of Transportation (Medical): No   Lack of Transportation (Non-Medical): No  Physical Activity: Inactive   Days of Exercise per Week: 0 days   Minutes of Exercise per Session: 0 min  Stress: No Stress Concern Present   Feeling of Stress : Not at all  Social Connections: Not on file  Intimate Partner Violence: Not At Risk   Fear of Current or Ex-Partner: No   Emotionally Abused: No   Physically Abused: No   Sexually Abused: No    FAMILY HISTORY: Family History  Problem Relation Age of Onset   Breast cancer Sister    Hyperlipidemia Son    Schizophrenia Mother    Colon cancer Neg Hx    Colon polyps Neg Hx    Esophageal cancer Neg Hx  Rectal cancer Neg Hx    Stomach cancer Neg Hx     ALLERGIES:  is allergic to hctz [hydrochlorothiazide] and lipitor [atorvastatin].  MEDICATIONS:  Current Outpatient Medications  Medication Sig Dispense Refill   amLODipine (NORVASC) 10 MG tablet TAKE 1 TABLET(10 MG) BY MOUTH DAILY 90 tablet 3   Cholecalciferol (VITAMIN D-3) 5000 units TABS Take 1 tablet by mouth daily.     diphenhydrAMINE (BENADRYL) 25 mg capsule Take 25 mg by mouth every 6 (six) hours as needed for itching or allergies.     lisinopril (ZESTRIL) 10 MG tablet Take 0.5 tablets (5 mg total) by mouth daily. 45 tablet 3   polyethylene glycol (MIRALAX / GLYCOLAX) 17 g packet Take 17 g by mouth daily as needed.     No current facility-administered medications for this visit.    REVIEW OF SYSTEMS:   .10 Point review  of Systems was done is negative except as noted above.   PHYSICAL EXAMINATION: ECOG FS:1 - Symptomatic but completely ambulatory  Vitals:   07/06/21 0920  BP: (!) 145/73  Pulse: 77  Resp: 15  Temp: 97.9 F (36.6 C)  SpO2: 100%    Wt Readings from Last 3 Encounters:  07/06/21 181 lb 3.2 oz (82.2 kg)  04/05/21 172 lb (78 kg)  03/21/21 174 lb (78.9 kg)   Body mass index is 34.24 kg/m.   Marland Kitchen GENERAL:alert, in no acute distress and comfortable SKIN: no acute rashes, no significant lesions EYES: conjunctiva are pink and non-injected, sclera anicteric OROPHARYNX: MMM, no exudates, no oropharyngeal erythema or ulceration NECK: supple, no JVD LYMPH:  no palpable lymphadenopathy in the cervical, axillary or inguinal regions LUNGS: clear to auscultation b/l with normal respiratory effort HEART: regular rate & rhythm ABDOMEN:  normoactive bowel sounds , non tender, not distended. Extremity: no pedal edema PSYCH: alert & oriented x 3 with fluent speech NEURO: no focal motor/sensory deficits   LABORATORY DATA:  I have reviewed the data as listed  . CBC Latest Ref Rng & Units 07/06/2021 02/10/2021 10/14/2020  WBC 4.0 - 10.5 K/uL 6.5 5.1 4.5  Hemoglobin 12.0 - 15.0 g/dL 11.3(L) 11.9(L) 12.0  Hematocrit 36.0 - 46.0 % 36.5 38.2 38.9  Platelets 150 - 400 K/uL 208 200 279    . CMP Latest Ref Rng & Units 07/06/2021 02/10/2021 10/14/2020  Glucose 70 - 99 mg/dL 106(H) 92 97  BUN 8 - 23 mg/dL 30(H) 23 17  Creatinine 0.44 - 1.00 mg/dL 1.32(H) 1.14(H) 1.06(H)  Sodium 135 - 145 mmol/L 139 140 144  Potassium 3.5 - 5.1 mmol/L 3.8 4.0 4.0  Chloride 98 - 111 mmol/L 107 109 107  CO2 22 - 32 mmol/L 25 22 23   Calcium 8.9 - 10.3 mg/dL 9.3 9.5 9.6  Total Protein 6.5 - 8.1 g/dL 6.9 6.6 7.4  Total Bilirubin 0.3 - 1.2 mg/dL 0.3 0.3 0.3  Alkaline Phos 38 - 126 U/L 78 95 86  AST 15 - 41 U/L 12(L) 17 20  ALT 0 - 44 U/L 11 15 22    . Lab Results  Component Value Date   LDH 174 07/06/2021      09/22/18 Right Axillary LN Biopsy:    07/31/18 Molecular Pathology:     02/21/16 Biopsy:    RADIOGRAPHIC STUDIES: I have personally reviewed the radiological images as listed and agreed with the findings in the report. No results found.   ASSESSMENT & PLAN:   76 y.o. female with  1. Chronic Lymphocytic Leukemia  02/21/16 Right  Axillary LN biopsy revealed SLL, the initial diagnosis. 07/31/18 FISH CLL Prognostic Panel revealed:52.0% cells with 11q deletion Labs upon initial presentation from 09/22/18, WBC at 16.8k, HGB at 10.9, PLT at 272k. ANC at 1.2k, Lymphs at 11.3k, Monocytes at 4.1k. 09/22/18 Right Axillary LN Biopsy revealed SLL, ruling out a transformation event  08/15/18 PET/CT revealed Bulky lymphadenopathy in the neck, chest, abdomen, and pelvis demonstrating low level hypermetabolism throughout. 9 mm right thyroid nodule is hypermetabolic. Thyroid ultrasound recommended to further evaluate. Mucosal thickening with air-fluid level in the left maxillary sinus. 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  08/12/2019 PET/CT scan ([id number]) revealed disease progression in neck, chest, abdomen, pelvis and interval development of pulmonary lesions  03/23/2020 CT C/A/P ([id number]) ([id number]) revealed. "1. Significant positive response to therapy. No new or progressive disease."   PLAN: -I discussed her lab results from today, CBC stable with hemoglobin of 11.3 normal WBC count and platelets. CMP within normal limits creatinine 1.3 with slight elevation of BUN.  Patient appears little dehydrated and was recommended to drink more water. LDH within normal limits Patient has no clinical or lab evidence of CLL/SLL progression at this time. No indication for additional treatment of her CLL at this time. We will continue active surveillance with labs and H&P.  FOLLOW UP: Return to clinic with Dr. Irene Limbo with labs in 4 months  All of the patient's questions  were answered with apparent satisfaction. The patient knows to call the clinic with any problems, questions or concerns.   Sullivan Lone MD [place] AAHIVMS [facility] Hematology/Oncology Physician [facility]

## 2021-09-28 ENCOUNTER — Telehealth: Payer: Self-pay | Admitting: Family Medicine

## 2021-09-28 ENCOUNTER — Ambulatory Visit: Payer: Medicare PPO

## 2021-09-28 DIAGNOSIS — I10 Essential (primary) hypertension: Secondary | ICD-10-CM

## 2021-09-28 DIAGNOSIS — E782 Mixed hyperlipidemia: Secondary | ICD-10-CM

## 2021-09-28 NOTE — Telephone Encounter (Signed)
-----   Message from Ellamae Sia sent at 09/28/2021  1:39 PM EDT ----- ?Regarding: Lab orders for Friday, 3.24.23 ?Patient is scheduled for CPX labs, please order future labs, Thanks , Terri ? ? ?

## 2021-10-02 ENCOUNTER — Other Ambulatory Visit: Payer: Self-pay

## 2021-10-02 ENCOUNTER — Other Ambulatory Visit (INDEPENDENT_AMBULATORY_CARE_PROVIDER_SITE_OTHER): Payer: Medicare PPO

## 2021-10-02 DIAGNOSIS — E782 Mixed hyperlipidemia: Secondary | ICD-10-CM

## 2021-10-02 DIAGNOSIS — I10 Essential (primary) hypertension: Secondary | ICD-10-CM | POA: Diagnosis not present

## 2021-10-02 LAB — COMPREHENSIVE METABOLIC PANEL
ALT: 13 U/L (ref 0–35)
AST: 14 U/L (ref 0–37)
Albumin: 4.4 g/dL (ref 3.5–5.2)
Alkaline Phosphatase: 67 U/L (ref 39–117)
BUN: 24 mg/dL — ABNORMAL HIGH (ref 6–23)
CO2: 27 mEq/L (ref 19–32)
Calcium: 10 mg/dL (ref 8.4–10.5)
Chloride: 108 mEq/L (ref 96–112)
Creatinine, Ser: 1.36 mg/dL — ABNORMAL HIGH (ref 0.40–1.20)
GFR: 38.24 mL/min — ABNORMAL LOW (ref >60.00)
Glucose, Bld: 91 mg/dL (ref 70–99)
Potassium: 4.1 mEq/L (ref 3.5–5.1)
Sodium: 142 mEq/L (ref 135–145)
Total Bilirubin: 0.4 mg/dL (ref 0.2–1.2)
Total Protein: 6.5 g/dL (ref 6.0–8.3)

## 2021-10-02 LAB — CBC WITH DIFFERENTIAL/PLATELET
Basophils Absolute: 0 10*3/uL (ref 0.0–0.1)
Basophils Relative: 0.4 % (ref 0.0–3.0)
Eosinophils Absolute: 0.2 10*3/uL (ref 0.0–0.7)
Eosinophils Relative: 3.9 % (ref 0.0–5.0)
HCT: 37 % (ref 36.0–46.0)
Hemoglobin: 11.9 g/dL — ABNORMAL LOW (ref 12.0–15.0)
Lymphocytes Relative: 53.1 % — ABNORMAL HIGH (ref 12.0–46.0)
Lymphs Abs: 3 10*3/uL (ref 0.7–4.0)
MCHC: 32.2 g/dL (ref 30.0–36.0)
MCV: 84 fl (ref 78.0–100.0)
Monocytes Absolute: 0.4 10*3/uL (ref 0.1–1.0)
Monocytes Relative: 7.8 % (ref 3.0–12.0)
Neutro Abs: 2 10*3/uL (ref 1.4–7.7)
Neutrophils Relative %: 34.8 % — ABNORMAL LOW (ref 43.0–77.0)
Platelets: 224 10*3/uL (ref 150.0–400.0)
RBC: 4.41 Mil/uL (ref 3.87–5.11)
RDW: 14.2 % (ref 11.5–15.5)
WBC: 5.7 10*3/uL (ref 4.0–10.5)

## 2021-10-02 LAB — LIPID PANEL
Cholesterol: 319 mg/dL — ABNORMAL HIGH (ref 0–200)
HDL: 57.4 mg/dL (ref >39.00)
LDL Cholesterol: 232 mg/dL — ABNORMAL HIGH (ref 0–99)
NonHDL: 262.05
Total CHOL/HDL Ratio: 6
Triglycerides: 148 mg/dL (ref 0.0–149.0)
VLDL: 29.6 mg/dL (ref 0.0–40.0)

## 2021-10-03 ENCOUNTER — Encounter: Payer: Self-pay | Admitting: Hematology

## 2021-10-03 LAB — TSH: TSH: 1.37 u[IU]/mL (ref 0.35–5.50)

## 2021-10-10 ENCOUNTER — Encounter: Payer: Self-pay | Admitting: Family Medicine

## 2021-10-10 ENCOUNTER — Ambulatory Visit (INDEPENDENT_AMBULATORY_CARE_PROVIDER_SITE_OTHER): Payer: Medicare PPO | Admitting: Family Medicine

## 2021-10-10 VITALS — BP 162/86 | HR 74 | Temp 97.4°F | Ht 60.5 in | Wt 176.4 lb

## 2021-10-10 DIAGNOSIS — E2839 Other primary ovarian failure: Secondary | ICD-10-CM

## 2021-10-10 DIAGNOSIS — R7989 Other specified abnormal findings of blood chemistry: Secondary | ICD-10-CM

## 2021-10-10 DIAGNOSIS — Z0000 Encounter for general adult medical examination without abnormal findings: Secondary | ICD-10-CM

## 2021-10-10 DIAGNOSIS — E782 Mixed hyperlipidemia: Secondary | ICD-10-CM | POA: Diagnosis not present

## 2021-10-10 DIAGNOSIS — C9110 Chronic lymphocytic leukemia of B-cell type not having achieved remission: Secondary | ICD-10-CM | POA: Diagnosis not present

## 2021-10-10 DIAGNOSIS — I10 Essential (primary) hypertension: Secondary | ICD-10-CM

## 2021-10-10 DIAGNOSIS — Z1231 Encounter for screening mammogram for malignant neoplasm of breast: Secondary | ICD-10-CM | POA: Insufficient documentation

## 2021-10-10 MED ORDER — LISINOPRIL 10 MG PO TABS: 5.0000 mg | ORAL_TABLET | Freq: Every day | ORAL | 3 refills | Status: DC

## 2021-10-10 MED ORDER — AMLODIPINE BESYLATE 10 MG PO TABS: ORAL_TABLET | ORAL | 3 refills | Status: DC

## 2021-10-10 MED ORDER — ROSUVASTATIN CALCIUM 10 MG PO TABS: 10.0000 mg | ORAL_TABLET | Freq: Every day | ORAL | 3 refills | Status: DC

## 2021-10-10 NOTE — Assessment & Plan Note (Addendum)
This is noted here and at her oncology office ?Last creatinine of 1.36 ?Patient has made a good effort to start drinking at least 64 ounces of water daily plus other liquids ?Counseled her on avoiding NSAIDs and herbal medications and less checking with [place] first ?We will continue to follow ?Also stressed importance of blood pressure control ?

## 2021-10-10 NOTE — Assessment & Plan Note (Signed)
Continues care with oncology ?No treatment currently, in observation ?

## 2021-10-10 NOTE — Progress Notes (Signed)
? ?Subjective:  ? ? Patient ID: Erica Carlson, female    DOB: Dec 05, 1945, 76 y.o.   MRN: [id number] ? ?HPI ?Pt presents for amw and health mt visit  ? ?I have personally reviewed the Medicare Annual Wellness questionnaire and have noted ?1. The patient's medical and social history ?2. Their use of alcohol, tobacco or illicit drugs ?3. Their current medications and supplements ?4. The patient's functional ability including ADL's, fall risks, home safety risks and hearing or visual ?            impairment. ?5. Diet and physical activities ?6. Evidence for depression or mood disorders ? ?The patients weight, height, BMI have been recorded in the chart and visual acuity is per eye clinic.  ?I have made referrals, counseling and provided education to the patient based review of the above and I have provided the pt with a written personalized care plan for preventive services. ?Reviewed and updated provider list, see scanned forms. ? ?See scanned forms.  Routine anticipatory guidance given to patient.  See health maintenance. ?Colon cancer screening: colonoscopy 04/2018  ?Breast cancer screening  mammogram 10/2017 - needs to get that scheduled  ?Self breast exam: nothing new /has had LN under arms from lymphoma in the past  ?Daughter died of breast cancer  ?Flu vaccine 06/2021 ?Tetanus vaccine  08/2014 ?Pneumovax up to date  ?Zoster vaccine:  had shingles in the past  ?Dexa-declined in the past , is open to it now at the breast center  ?Falls: none  ?Fractures: none  ?Supplements: 5000 iu daily of vit D ?Exercise : 3 days per week, cardio and weight lifting at the gym  ?It helps stress also  ?Also works out side  ? ? ?Advance directive: has it up to date  ?Cognitive function addressed- see scanned forms- and if abnormal then additional documentation follows.  ? ?No problems  ?Handles own affairs and finances  ?Planning a wedding  ? ? ?PMH and SH reviewed ? ?Meds, vitals, and allergies reviewed.  ? ?ROS: See HPI.   Otherwise negative.   ? ?Weight : ?Wt Readings from Last 3 Encounters:  ?10/10/21 176 lb 6 oz (80 kg)  ?07/06/21 181 lb 3.2 oz (82.2 kg)  ?04/05/21 172 lb (78 kg)  ? ?33.88 kg/m? ?Trying to work on diet  ?Occ stress eats ?Wants to loose weight  ?Working with a Clinical research associate  ? ? ?Hearing/vision: ?Hearing Screening  ? '500Hz'$  '1000Hz'$  '2000Hz'$  '4000Hz'$   ?Right ear 40 40 40 40  ?Left ear 40 40 40 40  ?Vision Screening - Comments:: Eye exam in Feb 2023 at [facility] center ? ? ?PHQ: ? ?  10/10/2021  ?  3:02 PM 09/27/2020  ?  2:50 PM 07/22/2019  ?  9:41 AM 07/14/2018  ? 10:17 AM 07/08/2017  ? 11:35 AM  ?Depression screen PHQ 2/9  ?Decreased Interest 0 0 0 0 0  ?Down, Depressed, Hopeless 0 0 0 0 1  ?PHQ - 2 Score 0 0 0 0 1  ?Altered sleeping  0     ?Tired, decreased energy  0     ?Change in appetite  0     ?Feeling bad or failure about yourself   0     ?Trouble concentrating  0     ?Moving slowly or fidgety/restless  0     ?Suicidal thoughts  0     ?PHQ-9 Score  0     ?Difficult doing work/chores  Not difficult at all     ? ?  Stress ?Planning her wedding  ?April 22 - so excited to get that over with  ? ?ADLs: no help needed  ? ?Functionality:excellent  ? ?Care team : ?Jozey Janco-pcp ?Irene Limbo- oncology ?Xu-ortho  ?Nandigam- GI ? ?HTN ?bp is up, ran out of bp medicine  ?No cp or palpitations or headaches or edema  ?No side effects to medicines  ?BP Readings from Last 3 Encounters:  ?10/10/21 (!) 162/86  ?07/06/21 (!) 145/73  ?03/21/21 138/89  ? ? ? ?Amlodipine 10 mg daily ?Lisinopril 10 mg daily ? ?Hyperlipidemia  ? ?Lab Results  ?Component Value Date  ? CHOL 319 (H) 10/02/2021  ? CHOL 373 (H) 09/27/2020  ? CHOL 219 (H) 07/15/2019  ? ?Lab Results  ?Component Value Date  ? HDL 57.40 10/02/2021  ? HDL 58.70 09/27/2020  ? HDL 52.60 07/15/2019  ? ?Lab Results  ?Component Value Date  ? LDLCALC 232 (H) 10/02/2021  ? [place] 147 (H) 07/15/2019  ? LDLCALC 189 (H) 07/10/2018  ? ?Lab Results  ?Component Value Date  ? TRIG 148.0 10/02/2021  ? TRIG 261.0 (H)  09/27/2020  ? TRIG 98.0 07/15/2019  ? ?Lab Results  ?Component Value Date  ? CHOLHDL 6 10/02/2021  ? CHOLHDL 6 09/27/2020  ? CHOLHDL 4 07/15/2019  ? ?Lab Results  ?Component Value Date  ? LDLDIRECT 204.0 09/27/2020  ? LDLDIRECT 204.9 02/23/2014  ? LDLDIRECT 249.3 04/23/2012  ? ?Crestor 10 mg daily for a while  ?Was going to lipid clinic ?Should not get the pcyk9 covered  ? ?She does watch out for fatty foods in her diet  ?Also for sugar  ? ? ?Lymphoma / CLL-sees Dr Kale/oncology ?Monitoring presently/ no treatment  ? ?Other labs ?Lab Results  ?Component Value Date  ? WBC 5.7 10/02/2021  ? HGB 11.9 (L) 10/02/2021  ? HCT 37.0 10/02/2021  ? MCV 84.0 10/02/2021  ? PLT 224.0 10/02/2021  ? ?Lab Results  ?Component Value Date  ? TSH 1.37 10/02/2021  ? ?Lab Results  ?Component Value Date  ? CREATININE 1.36 (H) 10/02/2021  ? BUN 24 (H) 10/02/2021  ? NA 142 10/02/2021  ? K 4.1 10/02/2021  ? CL 108 10/02/2021  ? CO2 27 10/02/2021  ? ?At leas 64 oz water daily plus other liquids  ?Renal numbers are creeping up  ? ?Avoids advil and aleve  ? ? ? ?Lab Results  ?Component Value Date  ? ALT 13 10/02/2021  ? AST 14 10/02/2021  ? ALKPHOS 67 10/02/2021  ? BILITOT 0.4 10/02/2021  ?  ? ?Patient Active Problem List  ? Diagnosis Date Noted  ? Encounter for screening mammogram for breast cancer 10/10/2021  ? Elevated serum creatinine 10/10/2021  ? Hand pain 10/04/2020  ? 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 Counseling regarding advance care planning and goals of care 08/31/2019  ? Need for hepatitis C screening test 06/26/2016  ? 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 Encounter for Medicare annual wellness exam 09/30/2013  ? Estrogen deficiency 09/17/2012  ? Herpes zoster without complication 12/30/7626  ? Special screening for malignant neoplasms, colon 12/15/2010  ? Routine general medical examination at a health care facility 12/08/2010  ? OSTEOARTHRITIS, KNEE, RIGHT 10/11/2008  ? Internal hemorrhoids  09/02/2008  ? Hypertension 04/28/2008  ? PNEUMONIA, HX OF 03/03/2008  ? Hyperlipidemia 02/26/2008  ? ?Past Medical History:  ?Diagnosis Date  ? Allergy   ? spring  ? Arthritis   ? osteo arthritis of right knee  ? Cataract   ?  forming  ? Hyperlipidemia   ? Hypertension   ? Lymphoma, small-cell ([place])   ? right axillary   ? Pneumonia   ? ?Past Surgical History:  ?Procedure Laterality Date  ? ABDOMINAL HYSTERECTOMY    ? AXILLARY LYMPH NODE BIOPSY Right 02/07/2016  ? back injections    ? BREAST SURGERY    ? COLONOSCOPY  2009  ? EYE SURGERY    ? retinal tear repair  ? KNEE SURGERY    ? torn ALC per pt  ? ?Social History  ? ?Tobacco Use  ? Smoking status: Never  ? Smokeless tobacco: Never  ?Vaping Use  ? Vaping Use: Never used  ?Substance Use Topics  ? Alcohol use: No  ?  Alcohol/week: 0.0 standard drinks  ? Drug use: No  ? ?Family History  ?Problem Relation Age of Onset  ? Breast cancer Sister   ? Hyperlipidemia Son   ? Schizophrenia Mother   ? Colon cancer Neg Hx   ? Colon polyps Neg Hx   ? Esophageal cancer Neg Hx   ? Rectal cancer Neg Hx   ? Stomach cancer Neg Hx   ? ?Allergies  ?Allergen Reactions  ? Hctz [Hydrochlorothiazide] Nausea And Vomiting  ?  Nausea and vomiting   ? Lipitor [Atorvastatin] Other (See Comments)  ?  Ache in back and legs  ? ?Current Outpatient Medications on File Prior to Visit  ?Medication Sig Dispense Refill  ? Cholecalciferol (VITAMIN D-3) 5000 units TABS Take 1 tablet by mouth daily.    ? diphenhydrAMINE (BENADRYL) 25 mg capsule Take 25 mg by mouth every 6 (six) hours as needed for itching or allergies.    ? polyethylene glycol (MIRALAX / GLYCOLAX) 17 g packet Take 17 g by mouth daily as needed.    ? ?No current facility-administered medications on file prior to visit.  ?  ? ?Review of Systems  ?Constitutional:  Positive for fatigue. Negative for activity change, appetite change, fever and unexpected weight change.  ?HENT:  Negative for congestion, ear pain, rhinorrhea, sinus pressure and  sore throat.   ?Eyes:  Negative for pain, redness and visual disturbance.  ?Respiratory:  Negative for cough, shortness of breath and wheezing.   ?Cardiovascular:  Negative for chest pain and palpitations.  ?Gastrointestin

## 2021-10-10 NOTE — Assessment & Plan Note (Signed)
bp in fair control at this time  ?BP Readings from Last 1 Encounters:  ?10/10/21 (!) 162/86  ? ?Patient took her lisinopril 5 mg today but missed her amlodipine dose ?We will plan to get back on track with medications and follow-up in a month for recheck ?Most recent labs reviewed  ?Disc lifstyle change with low sodium diet and exercise  ? ?

## 2021-10-10 NOTE — Assessment & Plan Note (Signed)
Reviewed health habits including diet and exercise and skin cancer prevention ?Reviewed appropriate screening tests for age  ?Also reviewed health mt list, fam hx and immunization status , as well as social and family history   ?See HPI ? labs reviewed ?Colonoscopy is up-to-date from 10 of 2019 ?Mammogram is due, last one was April 2019, this was ordered and patient will call to schedule at the [facility] ?Patient is interested in the Shingrix vaccine and plans to call the pharmacy to check on coverage ?DEXA was ordered at the breast center along with a mammogram, patient was given the number to schedule ?No falls or fractures, takes 5000 units of vitamin D daily and exercises ?Advanced directive and directive is up-to-date ?No cognitive concerns ?Normal hearing screen today and vision/eye care are both up-to-date ?PHQ score is 0 ?No help needed with ADLs, functionality is excellent  ?

## 2021-10-10 NOTE — Assessment & Plan Note (Signed)
Reviewed health habits including diet and exercise and skin cancer prevention ?Reviewed appropriate screening tests for age  ?Also reviewed health mt list, fam hx and immunization status , as well as social and family history   ?See HPI ? labs reviewed ?Colonoscopy is up-to-date from 10 of 2019 ?Mammogram is due, last one was April 2019, this was ordered and patient will call to schedule at the [place] [facility] ?Patient is interested in the Shingrix vaccine and plans to call the pharmacy to check on coverage ?DEXA was ordered at the breast center along with a mammogram, patient was given the number to schedule ?No falls or fractures, takes 5000 units of vitamin D daily and exercises ?Advanced directive and directive is up-to-date ?No cognitive concerns ?Normal hearing screen today and vision/eye care are both up-to-date ?PHQ score is 0 ?No help needed with ADLs, functionality is excellent  ? ?

## 2021-10-10 NOTE — Patient Instructions (Addendum)
If you are interested in the new shingles vaccine (Shingrix) - call your local pharmacy to check on coverage and availability  ?If affordable, get on a wait list at your pharmacy to get the vaccine. ? ?  ?Follow up in 1 month to re check blood pressure on your medicine  ? ?For kidney function - drink lots of water /keep it up  ?Avoid anti inflammatory medicine- too hard on the kidney  ? ?Get back on crestor for your cholesterol  ? ?Please schedule your mammogram and dexa at the breast center ? ?Please call the location of your choice from the menu below to schedule your Mammogram and/or Bone Density appointment.   ? ?[place]  ? ?[facility]                ?      Phone:  [phone] ?[address]                               ?[place], [place] [address]                                                             ?Services: Traditional and 3D Mammogram, Bone Density  ? ?[place] [facility]           ?      Phone: [phone] ?[address]                                                       ?[place], [place] [address]    ?Service: Bone Density ONLY  ? *this site does NOT perform mammograms ? ?[place]                       ? Phone:  [phone] ?[address]                                  ?[place], [place] [address]                                            ?Services:  3D Mammogram and Bone Density  ? ? ?[place] ? ?[facility] [facility]   ?Phone:  [phone]   ?[place],  [address]                                            ?  Services: 3D Mammogram and Bone Density ? ?[place] at [facility])  ?Phone:  [phone]   ?[address]                        ?[place], [place] [address]                                               ?Services:  3D Mammogram and Bone Density ? ?

## 2021-10-10 NOTE — Assessment & Plan Note (Signed)
Order for mammogram done today ?Patient was given the number to call and schedule at the breast center ?

## 2021-10-26 ENCOUNTER — Inpatient Hospital Stay: Payer: Medicare PPO | Attending: Hematology | Admitting: Hematology

## 2021-10-26 ENCOUNTER — Other Ambulatory Visit: Payer: Self-pay

## 2021-10-26 ENCOUNTER — Inpatient Hospital Stay: Payer: Medicare PPO

## 2021-10-26 VITALS — BP 142/69 | HR 72 | Temp 97.9°F | Resp 18 | Wt 176.9 lb

## 2021-10-26 DIAGNOSIS — C9110 Chronic lymphocytic leukemia of B-cell type not having achieved remission: Secondary | ICD-10-CM | POA: Insufficient documentation

## 2021-10-26 LAB — CBC WITH DIFFERENTIAL/PLATELET
Abs Immature Granulocytes: 0.01 10*3/uL (ref 0.00–0.07)
Basophils Absolute: 0 10*3/uL (ref 0.0–0.1)
Basophils Relative: 0 %
Eosinophils Absolute: 0.3 10*3/uL (ref 0.0–0.5)
Eosinophils Relative: 7 %
HCT: 36.6 % (ref 36.0–46.0)
Hemoglobin: 11.2 g/dL — ABNORMAL LOW (ref 12.0–15.0)
Immature Granulocytes: 0 %
Lymphocytes Relative: 54 %
Lymphs Abs: 2.6 10*3/uL (ref 0.7–4.0)
MCH: 26.3 pg (ref 26.0–34.0)
MCHC: 30.6 g/dL (ref 30.0–36.0)
MCV: 85.9 fL (ref 80.0–100.0)
Monocytes Absolute: 0.4 10*3/uL (ref 0.1–1.0)
Monocytes Relative: 8 %
Neutro Abs: 1.5 10*3/uL — ABNORMAL LOW (ref 1.7–7.7)
Neutrophils Relative %: 31 %
Platelets: 213 10*3/uL (ref 150–400)
RBC: 4.26 MIL/uL (ref 3.87–5.11)
RDW: 13.7 % (ref 11.5–15.5)
WBC: 4.9 10*3/uL (ref 4.0–10.5)
nRBC: 0 % (ref 0.0–0.2)

## 2021-10-26 LAB — CMP (CANCER CENTER ONLY)
ALT: 13 U/L (ref 0–44)
AST: 14 U/L — ABNORMAL LOW (ref 15–41)
Albumin: 4 g/dL (ref 3.5–5.0)
Alkaline Phosphatase: 70 U/L (ref 38–126)
Anion gap: 3 — ABNORMAL LOW (ref 5–15)
BUN: 29 mg/dL — ABNORMAL HIGH (ref 8–23)
CO2: 28 mmol/L (ref 22–32)
Calcium: 9.3 mg/dL (ref 8.9–10.3)
Chloride: 108 mmol/L (ref 98–111)
Creatinine: 1.5 mg/dL — ABNORMAL HIGH (ref 0.44–1.00)
GFR, Estimated: 36 mL/min — ABNORMAL LOW (ref >60)
Glucose, Bld: 99 mg/dL (ref 70–99)
Potassium: 3.9 mmol/L (ref 3.5–5.1)
Sodium: 139 mmol/L (ref 135–145)
Total Bilirubin: 0.3 mg/dL (ref 0.3–1.2)
Total Protein: 6.7 g/dL (ref 6.5–8.1)

## 2021-10-26 LAB — LACTATE DEHYDROGENASE: LDH: 187 U/L (ref 98–192)

## 2021-10-26 NOTE — Progress Notes (Signed)
? ? ?HEMATOLOGY/ONCOLOGY CLINIC NOTE ? ?Date of Service: 10/26/2021 ? ? ?Patient Care Team: ?Tower, Wynelle Fanny, MD as PCP - General (Family Medicine) ?Galvin Proffer, OD as Consulting Physician (Optometry) ?Brunetta Genera, MD as Consulting Physician (Hematology) ? ?CHIEF COMPLAINTS/PURPOSE OF CONSULTATION:  ?Follow-up for continued evaluation and management of CLL ? ?HISTORY OF PRESENTING ILLNESS:  ?Please see previous notes for details on initial presentation. ? ?INTERVAL HISTORY:  ? ?Erica Carlson is a 76 y.o. female here for follow-up for continued evaluation and management of her CLL/SLL. She reports that she is feeling stressed. She further elaborated that it is due to her getting married 10/29/2021.  ? ?She reports that she has had a eye cataract surgery recently and reports improvement in vision. ? ?She notes some night sweats. ? ?She notes some inconsistent arthritic inflammation in right knee. ? ?No fatigue. ?No leg swelling. ?No fever, chills. ?No new lumps, bumps, or lesions/rashes. ?No abdominal pain, distension or change in bowel habits. ?No new or unexpected weight loss. ?No SOB or chest pain. ?No other new or acute focal symptoms. ? ?Labs done today 10/26/2021 were reviewed with the patient in details. ?CBC WNL except for decreased Hgb of 11.2 g/dL and ANC of 1.5. ?CMP WNL except for creatinine of 1.5 mg/dL, GFR est of 36, and AST of 14 all consist with chronic kidney disease. ? ?MEDICAL HISTORY:  ?Past Medical History:  ?Diagnosis Date  ? Allergy   ? spring  ? Arthritis   ? osteo arthritis of right knee  ? Cataract   ? forming  ? Hyperlipidemia   ? Hypertension   ? 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 right axillary   ? Pneumonia   ? ? ?SURGICAL HISTORY: ?Past Surgical History:  ?Procedure Laterality Date  ? ABDOMINAL HYSTERECTOMY    ? AXILLARY LYMPH NODE BIOPSY Right 02/07/2016  ? back injections    ? BREAST SURGERY    ? COLONOSCOPY  2009  ? EYE SURGERY    ? retinal tear repair  ? KNEE  SURGERY    ? torn ALC per pt  ? ? ?SOCIAL HISTORY: ?Social History  ? ?Socioeconomic History  ? Marital status: Widowed  ?  Spouse name: n/a  ? Number of children: 6  ? Years of education: 17  ? Highest education level: Not on file  ?Occupational History  ? Occupation: self-employed  ?  Employer: [employer]  ?  Comment: alterations and design  ?Tobacco Use  ? Smoking status: Never  ? Smokeless tobacco: Never  ?Vaping Use  ? Vaping Use: Never used  ?Substance and Sexual Activity  ? Alcohol use: No  ?  Alcohol/week: 0.0 standard drinks  ? Drug use: No  ? Sexual activity: Never  ?Other Topics Concern  ? Not on file  ?Social History Narrative  ? Lives alone.  ? Her adult children live nearby.  ? ?Social Determinants of Health  ? ?Financial Resource Strain: Not on file  ?Food Insecurity: Not on file  ?Transportation Needs: Not on file  ?Physical Activity: Not on file  ?Stress: Not on file  ?Social Connections: Not on file  ?Intimate Partner Violence: Not on file  ? ? ?FAMILY HISTORY: ?Family History  ?Problem Relation Age of Onset  ? Breast cancer Sister   ? Hyperlipidemia Son   ? Schizophrenia Mother   ? Colon cancer Neg Hx   ? Colon polyps Neg Hx   ? Esophageal cancer Neg Hx   ? Rectal  cancer Neg Hx   ? Stomach cancer Neg Hx   ? ? ?ALLERGIES:  is allergic to hctz [hydrochlorothiazide] and lipitor [atorvastatin]. ? ?MEDICATIONS:  ?Current Outpatient Medications  ?Medication Sig Dispense Refill  ? amLODipine (NORVASC) 10 MG tablet TAKE 1 TABLET(10 MG) BY MOUTH DAILY 90 tablet 3  ? Cholecalciferol (VITAMIN D-3) 5000 units TABS Take 1 tablet by mouth daily.    ? diphenhydrAMINE (BENADRYL) 25 mg capsule Take 25 mg by mouth every 6 (six) hours as needed for itching or allergies.    ? lisinopril (ZESTRIL) 10 MG tablet Take 0.5 tablets (5 mg total) by mouth daily. 45 tablet 3  ? polyethylene glycol (MIRALAX / GLYCOLAX) 17 g packet Take 17 g by mouth daily as needed.    ? rosuvastatin (CRESTOR) 10 MG tablet Take 1 tablet  (10 mg total) by mouth daily. 90 tablet 3  ? ?No current facility-administered medications for this visit.  ? ? ?REVIEW OF SYSTEMS:   ?.10 Point review of Systems was done is negative except as noted above. ? ? ?PHYSICAL EXAMINATION: ?ECOG FS:1 - Symptomatic but completely ambulatory ? ?Vitals:  ? 10/26/21 1357  ?BP: (!) 142/69  ?Pulse: 72  ?Resp: 18  ?Temp: 97.9 ?F (36.6 ?C)  ?SpO2: 100%  ? ? ?Wt Readings from Last 3 Encounters:  ?10/26/21 176 lb 14.4 oz (80.2 kg)  ?10/10/21 176 lb 6 oz (80 kg)  ?07/06/21 181 lb 3.2 oz (82.2 kg)  ? ?Body mass index is 33.98 kg/m?Marland Kitchen   ?NAD ?GENERAL:alert, in no acute distress and comfortable ?SKIN: no acute rashes, no significant lesions ?EYES: conjunctiva are pink and non-injected, sclera anicteric ?NECK: supple, no JVD ?LYMPH:  no palpable lymphadenopathy in the cervical, axillary or inguinal regions ?LUNGS: clear to auscultation b/l with normal respiratory effort ?HEART: regular rate & rhythm ?ABDOMEN:  normoactive bowel sounds , non tender, not distended. ?Extremity: no pedal edema ?PSYCH: alert & oriented x 3 with fluent speech ?NEURO: no focal motor/sensory deficits ? ?LABORATORY DATA:  ?I have reviewed the data as listed ? ?. ? ?  Latest Ref Rng & Units 10/26/2021  ?  1:30 PM 10/02/2021  ?  1:16 PM 07/06/2021  ?  9:06 AM  ?CBC  ?WBC 4.0 - 10.5 K/uL 4.9   5.7   6.5    ?Hemoglobin 12.0 - 15.0 g/dL 11.2   11.9   11.3    ?Hematocrit 36.0 - 46.0 % 36.6   37.0   36.5    ?Platelets 150 - 400 K/uL 213   224.0   208    ? ? ?. ? ?  Latest Ref Rng & Units 10/26/2021  ?  1:30 PM 10/02/2021  ?  1:16 PM 07/06/2021  ?  9:06 AM  ?CMP  ?Glucose 70 - 99 mg/dL 99   91   106    ?BUN 8 - 23 mg/dL '29   24   30    '$ ?Creatinine 0.44 - 1.00 mg/dL 1.50   1.36   1.32    ?Sodium 135 - 145 mmol/L 139   142   139    ?Potassium 3.5 - 5.1 mmol/L 3.9   4.1   3.8    ?Chloride 98 - 111 mmol/L 108   108   107    ?CO2 22 - 32 mmol/L '28   27   25    '$ ?Calcium 8.9 - 10.3 mg/dL 9.3   10.0   9.3    ?Total Protein 6.5 - 8.1  g/dL 6.7   6.5   6.9    ?Total Bilirubin 0.3 - 1.2 mg/dL 0.3   0.4   0.3    ?Alkaline Phos 38 - 126 U/L 70   67   78    ?AST 15 - 41 U/L '14   14   12    '$ ?ALT 0 - 44 U/L '13   13   11    '$ ? ?. ?Lab Results  ?Component Value Date  ? LDH 174 07/06/2021  ? ? ? ?09/22/18 Right Axillary LN Biopsy: ? ? ? ?07/31/18 Molecular Pathology: ? ? ? ? ?02/21/16 Biopsy: ? ? ? ?RADIOGRAPHIC STUDIES: ?I have personally reviewed the radiological images as listed and agreed with the findings in the report. ?No results found. ? ? ?ASSESSMENT & PLAN:  ? ?76 y.o. female with ? ?1. Chronic Lymphocytic Leukemia ? ?02/21/16 Right Axillary LN biopsy revealed SLL, the initial diagnosis. ?07/31/18 FISH CLL Prognostic Panel revealed:52.0% cells with 11q deletion ?Labs upon initial presentation from 09/22/18, WBC at 16.8k, HGB at 10.9, PLT at 272k. ANC at 1.2k, Lymphs at 11.3k, Monocytes at 4.1k. ?09/22/18 Right Axillary LN Biopsy revealed SLL, ruling out a transformation event ? ?08/15/18 PET/CT revealed Bulky lymphadenopathy in the neck, chest, abdomen, and pelvis demonstrating low level hypermetabolism throughout. 9 mm right thyroid nodule is hypermetabolic. Thyroid ultrasound recommended to further evaluate. Mucosal thickening with air-fluid level in the left maxillary sinus. 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 ? ?08/12/2019 PET/CT scan ([id number]) revealed disease progression in neck, chest, abdomen, pelvis and interval development of pulmonary lesions ? ?03/23/2020 CT C/A/P ([id number]) ([id number]) revealed. "1. Significant positive response to therapy. No new or progressive disease." ? ? ?PLAN: ?-Labs done today 10/26/2021 were reviewed with the patient in details. ?CBC WNL except for decreased Hgb of 11.2 g/dL and ANC of 1.5. ?CMP WNL except for creatinine of 1.5 mg/dL, GFR est of 36, and AST of 14 all consist with chronic kidney disease. ?Slight elevation of BUN.  Patient appears little dehydrated and was recommended to  drink 48-64 oz of water per day. ?LDH within normal limits @ 187 ?-Patient has no clinical or lab evidence of CLL/SLL progression at this time. ?-No indication for additional treatment of her CLL at th

## 2021-10-27 ENCOUNTER — Telehealth: Payer: Self-pay | Admitting: Hematology

## 2021-10-27 NOTE — Telephone Encounter (Signed)
Unable to leave message with follow-up appointment per 4/20 los. Mailed calendar. ?

## 2021-10-28 ENCOUNTER — Encounter: Payer: Self-pay | Admitting: Hematology

## 2021-11-10 ENCOUNTER — Ambulatory Visit: Payer: Medicare PPO | Admitting: Family Medicine

## 2021-11-14 ENCOUNTER — Encounter: Payer: Self-pay | Admitting: Family Medicine

## 2021-11-14 ENCOUNTER — Ambulatory Visit: Payer: Medicare PPO | Admitting: Family Medicine

## 2021-11-14 VITALS — BP 140/80 | HR 65 | Temp 97.8°F | Ht 60.5 in | Wt 180.1 lb

## 2021-11-14 DIAGNOSIS — R7989 Other specified abnormal findings of blood chemistry: Secondary | ICD-10-CM | POA: Diagnosis not present

## 2021-11-14 DIAGNOSIS — N1832 Chronic kidney disease, stage 3b: Secondary | ICD-10-CM

## 2021-11-14 DIAGNOSIS — I10 Essential (primary) hypertension: Secondary | ICD-10-CM

## 2021-11-14 DIAGNOSIS — E782 Mixed hyperlipidemia: Secondary | ICD-10-CM | POA: Diagnosis not present

## 2021-11-14 LAB — POC URINALSYSI DIPSTICK (AUTOMATED)
Bilirubin, UA: NEGATIVE
Blood, UA: NEGATIVE
Glucose, UA: NEGATIVE
Ketones, UA: NEGATIVE
Leukocytes, UA: NEGATIVE
Nitrite, UA: NEGATIVE
Protein, UA: NEGATIVE
Spec Grav, UA: 1.01 (ref 1.010–1.025)
Urobilinogen, UA: 0.2 E.U./dL
pH, UA: 6 (ref 5.0–8.0)

## 2021-11-14 NOTE — Patient Instructions (Addendum)
Keep drinking at least 64 oz of fluids daily (mostly water)  ?Take care of yourself  ?Continue the exercise and add more walking when you can ? ? ?Urinalysis today  ?Repeat kidney labs today  ? ?I may refer you to a kidney specialist  ? ?Keep watching your blood pressure at  home  ?Alert [place] if it stays over 140 top or 90 on the bottom  ?Continue current medicines  ? ? ?Avoid nsaids (aspirin, aleve or ibuprofen)  ?Tylenol is ok  ? ? ?

## 2021-11-14 NOTE — Progress Notes (Signed)
? ?Subjective:  ? ? Patient ID: Erica Carlson, female    DOB: 21-Nov-1945, 76 y.o.   MRN: [id number] ? ?HPI ?Pt presents for f/u of HTN ? ?Wt Readings from Last 3 Encounters:  ?11/14/21 180 lb 2 oz (81.7 kg)  ?10/26/21 176 lb 14.4 oz (80.2 kg)  ?10/10/21 176 lb 6 oz (80 kg)  ? ?34.60 kg/m? ?Gained weight on honeymoon  ?Did stress eat a little also  ?Husb is moving in with her  ? ?Last visit, bp was up because she ran out of medicine  ?bp is stable today  ?No cp or palpitations or headaches or edema  ?No side effects to medicines  ?BP Readings from Last 3 Encounters:  ?11/14/21 (!) 152/84  ?10/26/21 (!) 142/69  ?10/10/21 (!) 162/86  ?   ?Now on  ?Amlodiine 10 mg daily  ?Lisnopril 5 mg  ?Tolerates well  ? ?Checks bp at home  ?135/80 usually  ? ? ? ?Is eating some better  ?Stress is less ?Is moving however  ? ? ?Lab Results  ?Component Value Date  ? CREATININE 1.50 (H) 10/26/2021  ? BUN 29 (H) 10/26/2021  ? NA 139 10/26/2021  ? K 3.9 10/26/2021  ? CL 108 10/26/2021  ? CO2 28 10/26/2021  ? ?Cr was elevated morso at the onc office ?Recommended she inc her fluids  ?GFR 36 ? ?64 oz per water plus more fluids  ? ?No burning with urination  ?No blood in urine ?No new voiding changes  ? ?Ua today  ?Results for orders placed or performed in visit on 11/14/21  ?POCT Urinalysis Dipstick (Automated)  ?Result Value Ref Range  ? Color, UA Light Yellow   ? Clarity, UA Clear   ? Glucose, UA Negative Negative  ? Bilirubin, UA Negative   ? Ketones, UA Negative   ? Spec Grav, UA 1.010 1.010 - 1.025  ? Blood, UA Negative   ? pH, UA 6.0 5.0 - 8.0  ? Protein, UA Negative Negative  ? Urobilinogen, UA 0.2 0.2 or 1.0 E.U./dL  ? Nitrite, UA Negative   ? Leukocytes, UA Negative Negative  ?  ?Entirely clear  ? ?Some back trouble ?As a rule does not take nsaids  ? ?No family history of kidney issues  ? ? ?Hyperlipidemia ?Lab Results  ?Component Value Date  ? CHOL 319 (H) 10/02/2021  ? HDL 57.40 10/02/2021  ? LDLCALC 232 (H) 10/02/2021  ?  LDLDIRECT 204.0 09/27/2020  ? TRIG 148.0 10/02/2021  ? CHOLHDL 6 10/02/2021  ? ? ?Went to lipid clinic and could not get pcyk9 med covered ?Tried crestor again  ?It gave her muscle pain  ?Trying to eat better ? ?Patient Active Problem List  ? Diagnosis Date Noted  ? Encounter for screening mammogram for breast cancer 10/10/2021  ? Elevated serum creatinine 10/10/2021  ? Hand pain 10/04/2020  ? 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 Counseling regarding advance care planning and goals of care 08/31/2019  ? Need for hepatitis C screening test 06/26/2016  ? 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 Encounter for Medicare annual wellness exam 09/30/2013  ? Estrogen deficiency 09/17/2012  ? Herpes zoster without complication 62/69/4854  ? Special screening for malignant neoplasms, colon 12/15/2010  ? Routine general medical examination at a health care facility 12/08/2010  ? OSTEOARTHRITIS, KNEE, RIGHT 10/11/2008  ? Internal hemorrhoids 09/02/2008  ? Hypertension 04/28/2008  ? PNEUMONIA, HX OF 03/03/2008  ? Hyperlipidemia 02/26/2008  ? ?Past Medical History:  ?  Diagnosis Date  ? Allergy   ? spring  ? Arthritis   ? osteo arthritis of right knee  ? Cataract   ? forming  ? Hyperlipidemia   ? Hypertension   ? Lymphoma, small-cell ([place])   ? right axillary   ? Pneumonia   ? ?Past Surgical History:  ?Procedure Laterality Date  ? ABDOMINAL HYSTERECTOMY    ? AXILLARY LYMPH NODE BIOPSY Right 02/07/2016  ? back injections    ? BREAST SURGERY    ? COLONOSCOPY  2009  ? EYE SURGERY    ? retinal tear repair  ? KNEE SURGERY    ? torn ALC per pt  ? ?Social History  ? ?Tobacco Use  ? Smoking status: Never  ? Smokeless tobacco: Never  ?Vaping Use  ? Vaping Use: Never used  ?Substance Use Topics  ? Alcohol use: No  ?  Alcohol/week: 0.0 standard drinks  ? Drug use: No  ? ?Family History  ?Problem Relation Age of Onset  ? Breast cancer Sister   ? Hyperlipidemia Son   ? Schizophrenia Mother   ? Colon cancer Neg Hx   ?  Colon polyps Neg Hx   ? Esophageal cancer Neg Hx   ? Rectal cancer Neg Hx   ? Stomach cancer Neg Hx   ? ?Allergies  ?Allergen Reactions  ? Crestor [Rosuvastatin] Nausea Only  ?  Muscle pain also  ? Hctz [Hydrochlorothiazide] Nausea And Vomiting  ?  Nausea and vomiting   ? Lipitor [Atorvastatin] Other (See Comments)  ?  Ache in back and legs  ? ?Current Outpatient Medications on File Prior to Visit  ?Medication Sig Dispense Refill  ? amLODipine (NORVASC) 10 MG tablet TAKE 1 TABLET(10 MG) BY MOUTH DAILY 90 tablet 3  ? Cholecalciferol (VITAMIN D-3) 5000 units TABS Take 1 tablet by mouth daily.    ? diphenhydrAMINE (BENADRYL) 25 mg capsule Take 25 mg by mouth every 6 (six) hours as needed for itching or allergies.    ? lisinopril (ZESTRIL) 10 MG tablet Take 0.5 tablets (5 mg total) by mouth daily. 45 tablet 3  ? polyethylene glycol (MIRALAX / GLYCOLAX) 17 g packet Take 17 g by mouth daily as needed.    ? ?No current facility-administered medications on file prior to visit.  ?  ? ? ?Review of Systems  ?Constitutional:  Negative for activity change, appetite change, fatigue, fever and unexpected weight change.  ?HENT:  Negative for congestion, ear pain, rhinorrhea, sinus pressure and sore throat.   ?Eyes:  Negative for pain, redness and visual disturbance.  ?Respiratory:  Negative for cough, shortness of breath and wheezing.   ?Cardiovascular:  Negative for chest pain and palpitations.  ?Gastrointestinal:  Negative for abdominal pain, blood in stool, constipation and diarrhea.  ?Endocrine: Negative for polydipsia and polyuria.  ?Genitourinary:  Negative for dysuria, frequency, hematuria and urgency.  ?Musculoskeletal:  Negative for arthralgias, back pain and myalgias.  ?Skin:  Negative for pallor and rash.  ?Allergic/Immunologic: Negative for environmental allergies.  ?Neurological:  Negative for dizziness, syncope and headaches.  ?Hematological:  Negative for adenopathy. Does not bruise/bleed easily.   ?Psychiatric/Behavioral:  Negative for decreased concentration and dysphoric mood. The patient is not nervous/anxious.   ? ?   ?Objective:  ? Physical Exam ?Constitutional:   ?   General: She is not in acute distress. ?   Appearance: Normal appearance. She is well-developed. She is obese. She is not ill-appearing or diaphoretic.  ?HENT:  ?   Head: Normocephalic  and atraumatic.  ?Eyes:  ?   Conjunctiva/sclera: Conjunctivae normal.  ?   Pupils: Pupils are equal, round, and reactive to light.  ?Neck:  ?   Thyroid: No thyromegaly.  ?   Vascular: No carotid bruit or JVD.  ?Cardiovascular:  ?   Rate and Rhythm: Normal rate and regular rhythm.  ?   Heart sounds: Normal heart sounds.  ?  No gallop.  ?Pulmonary:  ?   Effort: Pulmonary effort is normal. No respiratory distress.  ?   Breath sounds: Normal breath sounds. No wheezing or rales.  ?Abdominal:  ?   General: There is no distension or abdominal bruit.  ?   Palpations: Abdomen is soft.  ?   Tenderness: There is no right CVA tenderness or left CVA tenderness.  ?   Comments: No suprapubic tenderness or fullness  ?  ?Musculoskeletal:  ?   Cervical back: Normal range of motion and neck supple.  ?   Right lower leg: No edema.  ?   Left lower leg: No edema.  ?Lymphadenopathy:  ?   Cervical: No cervical adenopathy.  ?Skin: ?   General: Skin is warm and dry.  ?   Coloration: Skin is not jaundiced or pale.  ?   Findings: No erythema or rash.  ?Neurological:  ?   Mental Status: She is alert.  ?   Cranial Nerves: No cranial nerve deficit.  ?   Coordination: Coordination normal.  ?   Deep Tendon Reflexes: Reflexes are normal and symmetric. Reflexes normal.  ?Psychiatric:     ?   Mood and Affect: Mood normal.  ? ? ? ? ? ?   ?Assessment & Plan:  ? ?Problem List Items Addressed This Visit   ? ?  ? Cardiovascular and Mediastinum  ? Hypertension - Primary  ?  bp is improved with re start of meds ? ?BP: 140/80  ? ?Lisinopril 5 mg daily  (apprehensive to inc this due to change in renal  status)  ?Amlodipine 10 mg daily  ?Good habits ?bp at home avg 135/80 as well  ?Some renal lab changes ?Erica Carlson /lab today ? ?  ?  ? Relevant Orders  ? POCT Urinalysis Dipstick (Automated)  ? Basic metabolic panel  ?  ? Other  ? [facility]

## 2021-11-14 NOTE — Assessment & Plan Note (Signed)
Disc goals for lipids and reasons to control them ?Rev last labs with pt ?Rev low sat fat diet in detail ? ?LDL very high at 232 ?Unfortunately could not afford pcyk9 and did not qualify for aid  ?Intol of crestor /muscle pain  ?Is trying to watch her diet  ?

## 2021-11-14 NOTE — Assessment & Plan Note (Addendum)
This was up further to 1.5 at her last onc f/u on 4/20  ?At that time was already drinking 64 oz water daily plus other liquids ?No nsaids  ? ?H/o HTN and CLL and hyperlipidemia ?ua today is entirely clear ?Repeat bmet  ?Consider nephrology referral  ?

## 2021-11-14 NOTE — Assessment & Plan Note (Signed)
bp is improved with re start of meds ? ?BP: 140/80  ? ?Lisinopril 5 mg daily  (apprehensive to inc this due to change in renal status)  ?Amlodipine 10 mg daily  ?Good habits ?bp at home avg 135/80 as well  ?Some renal lab changes ?Johnnye Lana /lab today ?

## 2021-11-15 ENCOUNTER — Encounter: Payer: Self-pay | Admitting: Hematology

## 2021-11-15 LAB — BASIC METABOLIC PANEL
BUN: 26 mg/dL — ABNORMAL HIGH (ref 6–23)
CO2: 27 mEq/L (ref 19–32)
Calcium: 9.6 mg/dL (ref 8.4–10.5)
Chloride: 104 mEq/L (ref 96–112)
Creatinine, Ser: 1.44 mg/dL — ABNORMAL HIGH (ref 0.40–1.20)
GFR: 35.67 mL/min — ABNORMAL LOW (ref >60.00)
Glucose, Bld: 85 mg/dL (ref 70–99)
Potassium: 4 mEq/L (ref 3.5–5.1)
Sodium: 139 mEq/L (ref 135–145)

## 2021-11-16 ENCOUNTER — Telehealth: Payer: Self-pay | Admitting: *Deleted

## 2021-11-16 NOTE — Telephone Encounter (Signed)
-----   Message from Abner Greenspan, MD sent at 11/15/2021  4:55 PM EDT ----- ?Your kidney numbers are still elevated (a little improved) ?I would like to refer you to a nephrologist as we discussed ?Do you prefer [facility] or [place] ? ?

## 2021-11-16 NOTE — Telephone Encounter (Signed)
Called pt and no answer and no VM box set up ?

## 2021-11-20 DIAGNOSIS — M9902 Segmental and somatic dysfunction of thoracic region: Secondary | ICD-10-CM | POA: Diagnosis not present

## 2021-11-20 DIAGNOSIS — M5137 Other intervertebral disc degeneration, lumbosacral region: Secondary | ICD-10-CM | POA: Diagnosis not present

## 2021-11-20 DIAGNOSIS — M9903 Segmental and somatic dysfunction of lumbar region: Secondary | ICD-10-CM | POA: Diagnosis not present

## 2021-11-20 DIAGNOSIS — M9905 Segmental and somatic dysfunction of pelvic region: Secondary | ICD-10-CM | POA: Diagnosis not present

## 2021-11-20 DIAGNOSIS — M47814 Spondylosis without myelopathy or radiculopathy, thoracic region: Secondary | ICD-10-CM | POA: Diagnosis not present

## 2021-11-20 DIAGNOSIS — M5136 Other intervertebral disc degeneration, lumbar region: Secondary | ICD-10-CM | POA: Diagnosis not present

## 2021-11-23 DIAGNOSIS — N183 Chronic kidney disease, stage 3 unspecified: Secondary | ICD-10-CM | POA: Insufficient documentation

## 2021-11-23 NOTE — Addendum Note (Signed)
Addended by: Loura Pardon A on: 11/23/2021 04:21 PM   Modules accepted: Orders

## 2021-11-23 NOTE — Telephone Encounter (Signed)
Addressed through result notes  

## 2021-11-24 DIAGNOSIS — M9905 Segmental and somatic dysfunction of pelvic region: Secondary | ICD-10-CM | POA: Diagnosis not present

## 2021-11-24 DIAGNOSIS — M9902 Segmental and somatic dysfunction of thoracic region: Secondary | ICD-10-CM | POA: Diagnosis not present

## 2021-11-24 DIAGNOSIS — M47814 Spondylosis without myelopathy or radiculopathy, thoracic region: Secondary | ICD-10-CM | POA: Diagnosis not present

## 2021-11-24 DIAGNOSIS — M5137 Other intervertebral disc degeneration, lumbosacral region: Secondary | ICD-10-CM | POA: Diagnosis not present

## 2021-11-24 DIAGNOSIS — M9903 Segmental and somatic dysfunction of lumbar region: Secondary | ICD-10-CM | POA: Diagnosis not present

## 2021-11-24 DIAGNOSIS — M5136 Other intervertebral disc degeneration, lumbar region: Secondary | ICD-10-CM | POA: Diagnosis not present

## 2021-11-27 DIAGNOSIS — M5137 Other intervertebral disc degeneration, lumbosacral region: Secondary | ICD-10-CM | POA: Diagnosis not present

## 2021-11-27 DIAGNOSIS — M5136 Other intervertebral disc degeneration, lumbar region: Secondary | ICD-10-CM | POA: Diagnosis not present

## 2021-11-27 DIAGNOSIS — M9902 Segmental and somatic dysfunction of thoracic region: Secondary | ICD-10-CM | POA: Diagnosis not present

## 2021-11-27 DIAGNOSIS — M47814 Spondylosis without myelopathy or radiculopathy, thoracic region: Secondary | ICD-10-CM | POA: Diagnosis not present

## 2021-11-27 DIAGNOSIS — M9905 Segmental and somatic dysfunction of pelvic region: Secondary | ICD-10-CM | POA: Diagnosis not present

## 2021-11-27 DIAGNOSIS — M9903 Segmental and somatic dysfunction of lumbar region: Secondary | ICD-10-CM | POA: Diagnosis not present

## 2021-12-01 DIAGNOSIS — M9902 Segmental and somatic dysfunction of thoracic region: Secondary | ICD-10-CM | POA: Diagnosis not present

## 2021-12-01 DIAGNOSIS — S138XXA Sprain of joints and ligaments of other parts of neck, initial encounter: Secondary | ICD-10-CM | POA: Diagnosis not present

## 2021-12-01 DIAGNOSIS — M47814 Spondylosis without myelopathy or radiculopathy, thoracic region: Secondary | ICD-10-CM | POA: Diagnosis not present

## 2021-12-01 DIAGNOSIS — M9903 Segmental and somatic dysfunction of lumbar region: Secondary | ICD-10-CM | POA: Diagnosis not present

## 2021-12-01 DIAGNOSIS — M9901 Segmental and somatic dysfunction of cervical region: Secondary | ICD-10-CM | POA: Diagnosis not present

## 2021-12-01 DIAGNOSIS — M5137 Other intervertebral disc degeneration, lumbosacral region: Secondary | ICD-10-CM | POA: Diagnosis not present

## 2021-12-01 DIAGNOSIS — M9905 Segmental and somatic dysfunction of pelvic region: Secondary | ICD-10-CM | POA: Diagnosis not present

## 2021-12-01 DIAGNOSIS — M5136 Other intervertebral disc degeneration, lumbar region: Secondary | ICD-10-CM | POA: Diagnosis not present

## 2021-12-05 DIAGNOSIS — S138XXA Sprain of joints and ligaments of other parts of neck, initial encounter: Secondary | ICD-10-CM | POA: Diagnosis not present

## 2021-12-05 DIAGNOSIS — M9902 Segmental and somatic dysfunction of thoracic region: Secondary | ICD-10-CM | POA: Diagnosis not present

## 2021-12-05 DIAGNOSIS — M5137 Other intervertebral disc degeneration, lumbosacral region: Secondary | ICD-10-CM | POA: Diagnosis not present

## 2021-12-05 DIAGNOSIS — M9905 Segmental and somatic dysfunction of pelvic region: Secondary | ICD-10-CM | POA: Diagnosis not present

## 2021-12-05 DIAGNOSIS — M9903 Segmental and somatic dysfunction of lumbar region: Secondary | ICD-10-CM | POA: Diagnosis not present

## 2021-12-05 DIAGNOSIS — M5136 Other intervertebral disc degeneration, lumbar region: Secondary | ICD-10-CM | POA: Diagnosis not present

## 2021-12-05 DIAGNOSIS — M47814 Spondylosis without myelopathy or radiculopathy, thoracic region: Secondary | ICD-10-CM | POA: Diagnosis not present

## 2021-12-05 DIAGNOSIS — M9901 Segmental and somatic dysfunction of cervical region: Secondary | ICD-10-CM | POA: Diagnosis not present

## 2021-12-08 DIAGNOSIS — M5136 Other intervertebral disc degeneration, lumbar region: Secondary | ICD-10-CM | POA: Diagnosis not present

## 2021-12-08 DIAGNOSIS — M47814 Spondylosis without myelopathy or radiculopathy, thoracic region: Secondary | ICD-10-CM | POA: Diagnosis not present

## 2021-12-08 DIAGNOSIS — M9902 Segmental and somatic dysfunction of thoracic region: Secondary | ICD-10-CM | POA: Diagnosis not present

## 2021-12-08 DIAGNOSIS — M9901 Segmental and somatic dysfunction of cervical region: Secondary | ICD-10-CM | POA: Diagnosis not present

## 2021-12-08 DIAGNOSIS — M9903 Segmental and somatic dysfunction of lumbar region: Secondary | ICD-10-CM | POA: Diagnosis not present

## 2021-12-08 DIAGNOSIS — M9905 Segmental and somatic dysfunction of pelvic region: Secondary | ICD-10-CM | POA: Diagnosis not present

## 2021-12-08 DIAGNOSIS — M5137 Other intervertebral disc degeneration, lumbosacral region: Secondary | ICD-10-CM | POA: Diagnosis not present

## 2021-12-08 DIAGNOSIS — S138XXA Sprain of joints and ligaments of other parts of neck, initial encounter: Secondary | ICD-10-CM | POA: Diagnosis not present

## 2021-12-11 DIAGNOSIS — M47814 Spondylosis without myelopathy or radiculopathy, thoracic region: Secondary | ICD-10-CM | POA: Diagnosis not present

## 2021-12-11 DIAGNOSIS — M9903 Segmental and somatic dysfunction of lumbar region: Secondary | ICD-10-CM | POA: Diagnosis not present

## 2021-12-11 DIAGNOSIS — M9902 Segmental and somatic dysfunction of thoracic region: Secondary | ICD-10-CM | POA: Diagnosis not present

## 2021-12-11 DIAGNOSIS — S138XXA Sprain of joints and ligaments of other parts of neck, initial encounter: Secondary | ICD-10-CM | POA: Diagnosis not present

## 2021-12-11 DIAGNOSIS — M5136 Other intervertebral disc degeneration, lumbar region: Secondary | ICD-10-CM | POA: Diagnosis not present

## 2021-12-11 DIAGNOSIS — M5137 Other intervertebral disc degeneration, lumbosacral region: Secondary | ICD-10-CM | POA: Diagnosis not present

## 2021-12-11 DIAGNOSIS — M9901 Segmental and somatic dysfunction of cervical region: Secondary | ICD-10-CM | POA: Diagnosis not present

## 2021-12-11 DIAGNOSIS — M9905 Segmental and somatic dysfunction of pelvic region: Secondary | ICD-10-CM | POA: Diagnosis not present

## 2021-12-13 ENCOUNTER — Other Ambulatory Visit: Payer: Self-pay | Admitting: Oncology

## 2021-12-15 DIAGNOSIS — M47814 Spondylosis without myelopathy or radiculopathy, thoracic region: Secondary | ICD-10-CM | POA: Diagnosis not present

## 2021-12-15 DIAGNOSIS — M9905 Segmental and somatic dysfunction of pelvic region: Secondary | ICD-10-CM | POA: Diagnosis not present

## 2021-12-15 DIAGNOSIS — M5136 Other intervertebral disc degeneration, lumbar region: Secondary | ICD-10-CM | POA: Diagnosis not present

## 2021-12-15 DIAGNOSIS — S138XXA Sprain of joints and ligaments of other parts of neck, initial encounter: Secondary | ICD-10-CM | POA: Diagnosis not present

## 2021-12-15 DIAGNOSIS — M9902 Segmental and somatic dysfunction of thoracic region: Secondary | ICD-10-CM | POA: Diagnosis not present

## 2021-12-15 DIAGNOSIS — M5137 Other intervertebral disc degeneration, lumbosacral region: Secondary | ICD-10-CM | POA: Diagnosis not present

## 2021-12-15 DIAGNOSIS — M9903 Segmental and somatic dysfunction of lumbar region: Secondary | ICD-10-CM | POA: Diagnosis not present

## 2021-12-15 DIAGNOSIS — M9901 Segmental and somatic dysfunction of cervical region: Secondary | ICD-10-CM | POA: Diagnosis not present

## 2021-12-18 DIAGNOSIS — M5136 Other intervertebral disc degeneration, lumbar region: Secondary | ICD-10-CM | POA: Diagnosis not present

## 2021-12-18 DIAGNOSIS — M9903 Segmental and somatic dysfunction of lumbar region: Secondary | ICD-10-CM | POA: Diagnosis not present

## 2021-12-18 DIAGNOSIS — M47814 Spondylosis without myelopathy or radiculopathy, thoracic region: Secondary | ICD-10-CM | POA: Diagnosis not present

## 2021-12-18 DIAGNOSIS — M9902 Segmental and somatic dysfunction of thoracic region: Secondary | ICD-10-CM | POA: Diagnosis not present

## 2021-12-18 DIAGNOSIS — M9905 Segmental and somatic dysfunction of pelvic region: Secondary | ICD-10-CM | POA: Diagnosis not present

## 2021-12-18 DIAGNOSIS — S138XXA Sprain of joints and ligaments of other parts of neck, initial encounter: Secondary | ICD-10-CM | POA: Diagnosis not present

## 2021-12-18 DIAGNOSIS — M5137 Other intervertebral disc degeneration, lumbosacral region: Secondary | ICD-10-CM | POA: Diagnosis not present

## 2021-12-18 DIAGNOSIS — M9901 Segmental and somatic dysfunction of cervical region: Secondary | ICD-10-CM | POA: Diagnosis not present

## 2021-12-25 DIAGNOSIS — M9902 Segmental and somatic dysfunction of thoracic region: Secondary | ICD-10-CM | POA: Diagnosis not present

## 2021-12-25 DIAGNOSIS — M9901 Segmental and somatic dysfunction of cervical region: Secondary | ICD-10-CM | POA: Diagnosis not present

## 2021-12-25 DIAGNOSIS — M9905 Segmental and somatic dysfunction of pelvic region: Secondary | ICD-10-CM | POA: Diagnosis not present

## 2021-12-25 DIAGNOSIS — M5136 Other intervertebral disc degeneration, lumbar region: Secondary | ICD-10-CM | POA: Diagnosis not present

## 2021-12-25 DIAGNOSIS — M5137 Other intervertebral disc degeneration, lumbosacral region: Secondary | ICD-10-CM | POA: Diagnosis not present

## 2021-12-25 DIAGNOSIS — M47814 Spondylosis without myelopathy or radiculopathy, thoracic region: Secondary | ICD-10-CM | POA: Diagnosis not present

## 2021-12-25 DIAGNOSIS — M9903 Segmental and somatic dysfunction of lumbar region: Secondary | ICD-10-CM | POA: Diagnosis not present

## 2021-12-25 DIAGNOSIS — S138XXA Sprain of joints and ligaments of other parts of neck, initial encounter: Secondary | ICD-10-CM | POA: Diagnosis not present

## 2022-01-01 DIAGNOSIS — M9905 Segmental and somatic dysfunction of pelvic region: Secondary | ICD-10-CM | POA: Diagnosis not present

## 2022-01-01 DIAGNOSIS — M5137 Other intervertebral disc degeneration, lumbosacral region: Secondary | ICD-10-CM | POA: Diagnosis not present

## 2022-01-01 DIAGNOSIS — M9901 Segmental and somatic dysfunction of cervical region: Secondary | ICD-10-CM | POA: Diagnosis not present

## 2022-01-01 DIAGNOSIS — M5136 Other intervertebral disc degeneration, lumbar region: Secondary | ICD-10-CM | POA: Diagnosis not present

## 2022-01-01 DIAGNOSIS — M9902 Segmental and somatic dysfunction of thoracic region: Secondary | ICD-10-CM | POA: Diagnosis not present

## 2022-01-01 DIAGNOSIS — S138XXA Sprain of joints and ligaments of other parts of neck, initial encounter: Secondary | ICD-10-CM | POA: Diagnosis not present

## 2022-01-01 DIAGNOSIS — M9903 Segmental and somatic dysfunction of lumbar region: Secondary | ICD-10-CM | POA: Diagnosis not present

## 2022-01-01 DIAGNOSIS — M47814 Spondylosis without myelopathy or radiculopathy, thoracic region: Secondary | ICD-10-CM | POA: Diagnosis not present

## 2022-01-08 DIAGNOSIS — M9901 Segmental and somatic dysfunction of cervical region: Secondary | ICD-10-CM | POA: Diagnosis not present

## 2022-01-08 DIAGNOSIS — S138XXA Sprain of joints and ligaments of other parts of neck, initial encounter: Secondary | ICD-10-CM | POA: Diagnosis not present

## 2022-01-08 DIAGNOSIS — M9902 Segmental and somatic dysfunction of thoracic region: Secondary | ICD-10-CM | POA: Diagnosis not present

## 2022-01-08 DIAGNOSIS — M9903 Segmental and somatic dysfunction of lumbar region: Secondary | ICD-10-CM | POA: Diagnosis not present

## 2022-01-08 DIAGNOSIS — M47814 Spondylosis without myelopathy or radiculopathy, thoracic region: Secondary | ICD-10-CM | POA: Diagnosis not present

## 2022-01-08 DIAGNOSIS — M5136 Other intervertebral disc degeneration, lumbar region: Secondary | ICD-10-CM | POA: Diagnosis not present

## 2022-01-08 DIAGNOSIS — M9905 Segmental and somatic dysfunction of pelvic region: Secondary | ICD-10-CM | POA: Diagnosis not present

## 2022-01-08 DIAGNOSIS — M5137 Other intervertebral disc degeneration, lumbosacral region: Secondary | ICD-10-CM | POA: Diagnosis not present

## 2022-01-25 DIAGNOSIS — M9901 Segmental and somatic dysfunction of cervical region: Secondary | ICD-10-CM | POA: Diagnosis not present

## 2022-01-25 DIAGNOSIS — S138XXA Sprain of joints and ligaments of other parts of neck, initial encounter: Secondary | ICD-10-CM | POA: Diagnosis not present

## 2022-01-25 DIAGNOSIS — M5137 Other intervertebral disc degeneration, lumbosacral region: Secondary | ICD-10-CM | POA: Diagnosis not present

## 2022-01-25 DIAGNOSIS — M9903 Segmental and somatic dysfunction of lumbar region: Secondary | ICD-10-CM | POA: Diagnosis not present

## 2022-01-25 DIAGNOSIS — M9905 Segmental and somatic dysfunction of pelvic region: Secondary | ICD-10-CM | POA: Diagnosis not present

## 2022-01-25 DIAGNOSIS — M47814 Spondylosis without myelopathy or radiculopathy, thoracic region: Secondary | ICD-10-CM | POA: Diagnosis not present

## 2022-01-25 DIAGNOSIS — M9902 Segmental and somatic dysfunction of thoracic region: Secondary | ICD-10-CM | POA: Diagnosis not present

## 2022-01-25 DIAGNOSIS — M5136 Other intervertebral disc degeneration, lumbar region: Secondary | ICD-10-CM | POA: Diagnosis not present

## 2022-02-08 DIAGNOSIS — M5137 Other intervertebral disc degeneration, lumbosacral region: Secondary | ICD-10-CM | POA: Diagnosis not present

## 2022-02-08 DIAGNOSIS — M9903 Segmental and somatic dysfunction of lumbar region: Secondary | ICD-10-CM | POA: Diagnosis not present

## 2022-02-08 DIAGNOSIS — M9901 Segmental and somatic dysfunction of cervical region: Secondary | ICD-10-CM | POA: Diagnosis not present

## 2022-02-08 DIAGNOSIS — M47814 Spondylosis without myelopathy or radiculopathy, thoracic region: Secondary | ICD-10-CM | POA: Diagnosis not present

## 2022-02-08 DIAGNOSIS — S138XXA Sprain of joints and ligaments of other parts of neck, initial encounter: Secondary | ICD-10-CM | POA: Diagnosis not present

## 2022-02-08 DIAGNOSIS — M9902 Segmental and somatic dysfunction of thoracic region: Secondary | ICD-10-CM | POA: Diagnosis not present

## 2022-02-08 DIAGNOSIS — M5136 Other intervertebral disc degeneration, lumbar region: Secondary | ICD-10-CM | POA: Diagnosis not present

## 2022-02-08 DIAGNOSIS — M9905 Segmental and somatic dysfunction of pelvic region: Secondary | ICD-10-CM | POA: Diagnosis not present

## 2022-02-22 DIAGNOSIS — M47814 Spondylosis without myelopathy or radiculopathy, thoracic region: Secondary | ICD-10-CM | POA: Diagnosis not present

## 2022-02-22 DIAGNOSIS — M9901 Segmental and somatic dysfunction of cervical region: Secondary | ICD-10-CM | POA: Diagnosis not present

## 2022-02-22 DIAGNOSIS — M5137 Other intervertebral disc degeneration, lumbosacral region: Secondary | ICD-10-CM | POA: Diagnosis not present

## 2022-02-22 DIAGNOSIS — M9902 Segmental and somatic dysfunction of thoracic region: Secondary | ICD-10-CM | POA: Diagnosis not present

## 2022-02-22 DIAGNOSIS — M5136 Other intervertebral disc degeneration, lumbar region: Secondary | ICD-10-CM | POA: Diagnosis not present

## 2022-02-22 DIAGNOSIS — S138XXA Sprain of joints and ligaments of other parts of neck, initial encounter: Secondary | ICD-10-CM | POA: Diagnosis not present

## 2022-02-22 DIAGNOSIS — M9905 Segmental and somatic dysfunction of pelvic region: Secondary | ICD-10-CM | POA: Diagnosis not present

## 2022-02-22 DIAGNOSIS — M9903 Segmental and somatic dysfunction of lumbar region: Secondary | ICD-10-CM | POA: Diagnosis not present

## 2022-03-02 ENCOUNTER — Other Ambulatory Visit: Payer: Self-pay

## 2022-03-02 NOTE — Patient Outreach (Signed)
[name]) Care Management  03/02/2022  Erica Carlson 12-Apr-1946 [id number]   Telephone Screen    Outreach call to patient to introduce [facility] services and assess care needs as part of benefit of PCP office and insurance plan. Spoke with patient. She declines any RN CM needs or concerns at this time.    Main healthcare issue/concern today: None reported. Patient states he was "doing great." She has no issues regarding her meds. She is able to drive herself to medical appts. She remains independent with ADLs/IADLs.   Health Maintenance/Care Gaps Addressed & Education Provided:   -Last AWV: 10/10/21 per [place] with patient-she gets at PCP office and plans to do so in Dec.      Talmadge Ganas, RN,BSN,CCM [place] Management Telephonic Care Management Coordinator Direct Phone: [phone] Toll Free: [phone] Fax: [phone]

## 2022-03-09 DIAGNOSIS — S138XXA Sprain of joints and ligaments of other parts of neck, initial encounter: Secondary | ICD-10-CM | POA: Diagnosis not present

## 2022-03-09 DIAGNOSIS — M9902 Segmental and somatic dysfunction of thoracic region: Secondary | ICD-10-CM | POA: Diagnosis not present

## 2022-03-09 DIAGNOSIS — M9901 Segmental and somatic dysfunction of cervical region: Secondary | ICD-10-CM | POA: Diagnosis not present

## 2022-03-09 DIAGNOSIS — M9905 Segmental and somatic dysfunction of pelvic region: Secondary | ICD-10-CM | POA: Diagnosis not present

## 2022-03-09 DIAGNOSIS — M47814 Spondylosis without myelopathy or radiculopathy, thoracic region: Secondary | ICD-10-CM | POA: Diagnosis not present

## 2022-03-09 DIAGNOSIS — M5136 Other intervertebral disc degeneration, lumbar region: Secondary | ICD-10-CM | POA: Diagnosis not present

## 2022-03-09 DIAGNOSIS — M9903 Segmental and somatic dysfunction of lumbar region: Secondary | ICD-10-CM | POA: Diagnosis not present

## 2022-03-09 DIAGNOSIS — M5137 Other intervertebral disc degeneration, lumbosacral region: Secondary | ICD-10-CM | POA: Diagnosis not present

## 2022-03-27 DIAGNOSIS — C9110 Chronic lymphocytic leukemia of B-cell type not having achieved remission: Secondary | ICD-10-CM | POA: Diagnosis not present

## 2022-03-27 DIAGNOSIS — N1832 Chronic kidney disease, stage 3b: Secondary | ICD-10-CM | POA: Diagnosis not present

## 2022-03-27 DIAGNOSIS — I129 Hypertensive chronic kidney disease with stage 1 through stage 4 chronic kidney disease, or unspecified chronic kidney disease: Secondary | ICD-10-CM | POA: Diagnosis not present

## 2022-03-27 DIAGNOSIS — E559 Vitamin D deficiency, unspecified: Secondary | ICD-10-CM | POA: Diagnosis not present

## 2022-03-30 ENCOUNTER — Other Ambulatory Visit: Payer: Self-pay | Admitting: Nephrology

## 2022-03-30 DIAGNOSIS — N1832 Chronic kidney disease, stage 3b: Secondary | ICD-10-CM

## 2022-03-30 DIAGNOSIS — E559 Vitamin D deficiency, unspecified: Secondary | ICD-10-CM

## 2022-03-30 DIAGNOSIS — C9110 Chronic lymphocytic leukemia of B-cell type not having achieved remission: Secondary | ICD-10-CM

## 2022-03-30 DIAGNOSIS — I129 Hypertensive chronic kidney disease with stage 1 through stage 4 chronic kidney disease, or unspecified chronic kidney disease: Secondary | ICD-10-CM

## 2022-04-05 ENCOUNTER — Ambulatory Visit
Admission: RE | Admit: 2022-04-05 | Discharge: 2022-04-05 | Disposition: A | Discharge status: Home / Self Care | Payer: Medicare PPO | Source: Ambulatory Visit | Attending: Nephrology | Admitting: Nephrology

## 2022-04-05 DIAGNOSIS — Q632 Ectopic kidney: Secondary | ICD-10-CM | POA: Diagnosis not present

## 2022-04-05 DIAGNOSIS — I129 Hypertensive chronic kidney disease with stage 1 through stage 4 chronic kidney disease, or unspecified chronic kidney disease: Secondary | ICD-10-CM

## 2022-04-05 DIAGNOSIS — N189 Chronic kidney disease, unspecified: Secondary | ICD-10-CM | POA: Diagnosis not present

## 2022-04-05 DIAGNOSIS — C9110 Chronic lymphocytic leukemia of B-cell type not having achieved remission: Secondary | ICD-10-CM

## 2022-04-05 DIAGNOSIS — N1832 Chronic kidney disease, stage 3b: Secondary | ICD-10-CM

## 2022-04-05 DIAGNOSIS — E559 Vitamin D deficiency, unspecified: Secondary | ICD-10-CM

## 2022-04-09 DIAGNOSIS — S138XXA Sprain of joints and ligaments of other parts of neck, initial encounter: Secondary | ICD-10-CM | POA: Diagnosis not present

## 2022-04-09 DIAGNOSIS — M9902 Segmental and somatic dysfunction of thoracic region: Secondary | ICD-10-CM | POA: Diagnosis not present

## 2022-04-09 DIAGNOSIS — M9903 Segmental and somatic dysfunction of lumbar region: Secondary | ICD-10-CM | POA: Diagnosis not present

## 2022-04-09 DIAGNOSIS — M5137 Other intervertebral disc degeneration, lumbosacral region: Secondary | ICD-10-CM | POA: Diagnosis not present

## 2022-04-09 DIAGNOSIS — M9901 Segmental and somatic dysfunction of cervical region: Secondary | ICD-10-CM | POA: Diagnosis not present

## 2022-04-09 DIAGNOSIS — M47814 Spondylosis without myelopathy or radiculopathy, thoracic region: Secondary | ICD-10-CM | POA: Diagnosis not present

## 2022-04-09 DIAGNOSIS — M5136 Other intervertebral disc degeneration, lumbar region: Secondary | ICD-10-CM | POA: Diagnosis not present

## 2022-04-09 DIAGNOSIS — M9905 Segmental and somatic dysfunction of pelvic region: Secondary | ICD-10-CM | POA: Diagnosis not present

## 2022-04-11 DIAGNOSIS — H59811 Chorioretinal scars after surgery for detachment, right eye: Secondary | ICD-10-CM | POA: Diagnosis not present

## 2022-04-11 DIAGNOSIS — H353131 Nonexudative age-related macular degeneration, bilateral, early dry stage: Secondary | ICD-10-CM | POA: Diagnosis not present

## 2022-04-11 DIAGNOSIS — Z961 Presence of intraocular lens: Secondary | ICD-10-CM | POA: Diagnosis not present

## 2022-04-11 DIAGNOSIS — H35033 Hypertensive retinopathy, bilateral: Secondary | ICD-10-CM | POA: Diagnosis not present

## 2022-04-11 DIAGNOSIS — H43813 Vitreous degeneration, bilateral: Secondary | ICD-10-CM | POA: Diagnosis not present

## 2022-04-16 ENCOUNTER — Encounter: Payer: Self-pay | Admitting: Hematology

## 2022-04-18 ENCOUNTER — Telehealth: Payer: Self-pay | Admitting: Hematology

## 2022-04-18 NOTE — Telephone Encounter (Signed)
Rescheduled upcoming appointment due to provider's PAL. Patient is aware of changes. ?

## 2022-04-27 ENCOUNTER — Ambulatory Visit: Payer: Medicare PPO | Admitting: Hematology

## 2022-04-27 ENCOUNTER — Other Ambulatory Visit: Payer: Medicare PPO

## 2022-05-09 ENCOUNTER — Inpatient Hospital Stay: Payer: Medicare PPO | Attending: Hematology

## 2022-05-09 ENCOUNTER — Inpatient Hospital Stay: Payer: Medicare PPO | Admitting: Hematology

## 2022-05-09 VITALS — BP 159/89 | HR 71 | Temp 98.3°F | Resp 17 | Ht 60.5 in | Wt 177.9 lb

## 2022-05-09 DIAGNOSIS — Z79899 Other long term (current) drug therapy: Secondary | ICD-10-CM | POA: Insufficient documentation

## 2022-05-09 DIAGNOSIS — C9110 Chronic lymphocytic leukemia of B-cell type not having achieved remission: Secondary | ICD-10-CM | POA: Diagnosis not present

## 2022-05-09 DIAGNOSIS — R1900 Intra-abdominal and pelvic swelling, mass and lump, unspecified site: Secondary | ICD-10-CM | POA: Diagnosis not present

## 2022-05-09 LAB — CBC WITH DIFFERENTIAL/PLATELET
Abs Immature Granulocytes: 0.02 10*3/uL (ref 0.00–0.07)
Basophils Absolute: 0 10*3/uL (ref 0.0–0.1)
Basophils Relative: 0 %
Eosinophils Absolute: 0.3 10*3/uL (ref 0.0–0.5)
Eosinophils Relative: 4 %
HCT: 39 % (ref 36.0–46.0)
Hemoglobin: 12.3 g/dL (ref 12.0–15.0)
Immature Granulocytes: 0 %
Lymphocytes Relative: 53 %
Lymphs Abs: 2.9 10*3/uL (ref 0.7–4.0)
MCH: 26.5 pg (ref 26.0–34.0)
MCHC: 31.5 g/dL (ref 30.0–36.0)
MCV: 84.1 fL (ref 80.0–100.0)
Monocytes Absolute: 0.5 10*3/uL (ref 0.1–1.0)
Monocytes Relative: 8 %
Neutro Abs: 2 10*3/uL (ref 1.7–7.7)
Neutrophils Relative %: 35 %
Platelets: 232 10*3/uL (ref 150–400)
RBC: 4.64 MIL/uL (ref 3.87–5.11)
RDW: 13.9 % (ref 11.5–15.5)
WBC: 5.7 10*3/uL (ref 4.0–10.5)
nRBC: 0 % (ref 0.0–0.2)

## 2022-05-09 LAB — CMP (CANCER CENTER ONLY)
ALT: 11 U/L (ref 0–44)
AST: 14 U/L — ABNORMAL LOW (ref 15–41)
Albumin: 4.3 g/dL (ref 3.5–5.0)
Alkaline Phosphatase: 68 U/L (ref 38–126)
Anion gap: 3 — ABNORMAL LOW (ref 5–15)
BUN: 22 mg/dL (ref 8–23)
CO2: 29 mmol/L (ref 22–32)
Calcium: 9.8 mg/dL (ref 8.9–10.3)
Chloride: 106 mmol/L (ref 98–111)
Creatinine: 1.16 mg/dL — ABNORMAL HIGH (ref 0.44–1.00)
GFR, Estimated: 49 mL/min — ABNORMAL LOW (ref >60)
Glucose, Bld: 90 mg/dL (ref 70–99)
Potassium: 4 mmol/L (ref 3.5–5.1)
Sodium: 138 mmol/L (ref 135–145)
Total Bilirubin: 0.4 mg/dL (ref 0.3–1.2)
Total Protein: 7.3 g/dL (ref 6.5–8.1)

## 2022-05-09 LAB — LACTATE DEHYDROGENASE: LDH: 213 U/L — ABNORMAL HIGH (ref 98–192)

## 2022-05-11 DIAGNOSIS — H35033 Hypertensive retinopathy, bilateral: Secondary | ICD-10-CM | POA: Diagnosis not present

## 2022-05-11 DIAGNOSIS — I10 Essential (primary) hypertension: Secondary | ICD-10-CM | POA: Diagnosis not present

## 2022-05-11 DIAGNOSIS — Z961 Presence of intraocular lens: Secondary | ICD-10-CM | POA: Diagnosis not present

## 2022-05-11 DIAGNOSIS — H353131 Nonexudative age-related macular degeneration, bilateral, early dry stage: Secondary | ICD-10-CM | POA: Diagnosis not present

## 2022-05-15 NOTE — Progress Notes (Signed)
HEMATOLOGY/ONCOLOGY CLINIC NOTE  Date of Service: .05/09/2022  Patient Care Team: Tower, Wynelle Fanny, MD as PCP - General (Family Medicine) Galvin Proffer, [employer] as Consulting Physician (Optometry) Irene Limbo, Cloria Spring, MD as Consulting Physician (Hematology)  CHIEF COMPLAINTS/PURPOSE OF CONSULTATION:  Follow-up for continued evaluation and management of CLL  HISTORY OF PRESENTING ILLNESS:  Please see previous notes for details on initial presentation.  INTERVAL HISTORY:   Erica Carlson is a 76 y.o. female who is here for a 77-monthfollow-up for continued evaluation and management of CLL. She notes no acute new symptoms since her last clinic visit. Has been enjoying married life. No fevers no chills no night sweats no unexpected weight loss.  No new lumps or bumps. Labs done today were reviewed in detail with the patient.  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[place]    right axillary    Pneumonia     SURGICAL HISTORY: Past Surgical History:  Procedure Laterality Date   ABDOMINAL HYSTERECTOMY     AXILLARY LYMPH NODE BIOPSY Right 02/07/2016   back injections     BREAST SURGERY     COLONOSCOPY  2009   EYE SURGERY     retinal tear repair   KNEE SURGERY     torn ALC per pt    SOCIAL HISTORY: Social History   Socioeconomic History   Marital status: Widowed    Spouse name: n/a   Number of children: 6   Years of education: 14   Highest education level: Not on file  Occupational History   Occupation: self-employed    Employer: [employer]   Comment: alterations and design  Tobacco Use   Smoking status: Never   Smokeless tobacco: Never  Vaping Use   Vaping Use: Never used  Substance and Sexual Activity   Alcohol use: No    Alcohol/week: 0.0 standard drinks   Drug use: No    Sexual activity: Never  Other Topics Concern   Not on file  Social History Narrative   Lives alone.   Her adult children live nearby.   Social Determinants of Health   Financial Resource Strain: Not on file  Food Insecurity: Not on file  Transportation Needs: Not on file  Physical Activity: Not on file  Stress: Not on file  Social Connections: Not on file  Intimate Partner Violence: Not on file    FAMILY HISTORY: Family History  Problem Relation Age of Onset   Breast cancer Sister    Hyperlipidemia Son    Schizophrenia Mother    Colon cancer Neg Hx    Colon polyps Neg Hx    Esophageal cancer Neg Hx    Rectal cancer Neg Hx    Stomach cancer Neg Hx     ALLERGIES:  is allergic to hctz [hydrochlorothiazide] and lipitor [atorvastatin].  MEDICATIONS:  Current Outpatient Medications  Medication Sig Dispense Refill   amLODipine (NORVASC) 10 MG tablet TAKE 1 TABLET(10 MG) BY MOUTH DAILY 90 tablet 3   Cholecalciferol (VITAMIN D-3) 5000 units TABS Take 1 tablet by mouth daily.     diphenhydrAMINE (BENADRYL) 25 mg capsule Take 25 mg by mouth every 6 (six) hours as needed for itching or allergies.     lisinopril (ZESTRIL) 10  MG tablet Take 0.5 tablets (5 mg total) by mouth daily. 45 tablet 3   polyethylene glycol (MIRALAX / GLYCOLAX) 17 g packet Take 17 g by mouth daily as needed.     rosuvastatin (CRESTOR) 10 MG tablet Take 1 tablet (10 mg total) by mouth daily. 90 tablet 3   No current facility-administered medications for this visit.    REVIEW OF SYSTEMS:   10 Point review of Systems was done is negative except as noted above.   PHYSICAL EXAMINATION: ECOG FS:1 - Symptomatic but completely ambulatory  Vitals:   10/26/21 1357  BP: (!) 142/69  Pulse: 72  Resp: 18  Temp: 97.9 F (36.6 C)  SpO2: 100%    Wt Readings from Last 3 Encounters:  10/26/21 176 lb 14.4 oz (80.2 kg)  10/10/21 176 lb 6 oz (80 kg)  07/06/21 181 lb 3.2 oz (82.2 kg)   Body mass index is 33.98  kg/m.   NAD GENERAL:alert, in no acute distress and comfortable SKIN: no acute rashes, no significant lesions EYES: conjunctiva are pink and non-injected, sclera anicteric OROPHARYNX: MMM, no exudates, no oropharyngeal erythema or ulceration NECK: supple, no JVD LYMPH:  no palpable lymphadenopathy in the cervical, axillary or inguinal regions LUNGS: clear to auscultation b/l with normal respiratory effort HEART: regular rate & rhythm ABDOMEN:  normoactive bowel sounds , non tender, not distended. Extremity: no pedal edema PSYCH: alert & oriented x 3 with fluent speech NEURO: no focal motor/sensory deficits   LABORATORY DATA:  I have reviewed the data as listed  .    Latest Ref Rng & Units 05/09/2022   12:56 PM 10/26/2021    1:30 PM 10/02/2021    1:16 PM  CBC  WBC 4.0 - 10.5 K/uL 5.7  4.9  5.7   Hemoglobin 12.0 - 15.0 g/dL 12.3  11.2  11.9   Hematocrit 36.0 - 46.0 % 39.0  36.6  37.0   Platelets 150 - 400 K/uL 232  213  224.0     .    Latest Ref Rng & Units 05/09/2022   12:56 PM 11/14/2021    2:56 PM 10/26/2021    1:30 PM  CMP  Glucose 70 - 99 mg/dL 90  85  99   BUN 8 - 23 mg/dL '22  26  29   '$ Creatinine 0.44 - 1.00 mg/dL 1.16  1.44  1.50   Sodium 135 - 145 mmol/L 138  139  139   Potassium 3.5 - 5.1 mmol/L 4.0  4.0  3.9   Chloride 98 - 111 mmol/L 106  104  108   CO2 22 - 32 mmol/L '29  27  28   '$ Calcium 8.9 - 10.3 mg/dL 9.8  9.6  9.3   Total Protein 6.5 - 8.1 g/dL 7.3   6.7   Total Bilirubin 0.3 - 1.2 mg/dL 0.4   0.3   Alkaline Phos 38 - 126 U/L 68   70   AST 15 - 41 U/L 14   14   ALT 0 - 44 U/L 11   13    . Lab Results  Component Value Date   LDH 213 (H) 05/09/2022     09/22/18 Right Axillary LN Biopsy:    07/31/18 Molecular Pathology:     02/21/16 Biopsy:    RADIOGRAPHIC STUDIES: I have personally reviewed the radiological images as listed and agreed with the findings in the report. No results found.   ASSESSMENT & PLAN:   76 y.o. female with  1.  Chronic Lymphocytic Leukemia  02/21/16 Right Axillary LN biopsy revealed SLL, the initial diagnosis. 07/31/18 FISH CLL Prognostic Panel revealed:52.0% cells with 11q deletion Labs upon initial presentation from 09/22/18, WBC at 16.8k, HGB at 10.9, PLT at 272k. ANC at 1.2k, Lymphs at 11.3k, Monocytes at 4.1k. 09/22/18 Right Axillary LN Biopsy revealed SLL, ruling out a transformation event  08/15/18 PET/CT revealed Bulky lymphadenopathy in the neck, chest, abdomen, and pelvis demonstrating low level hypermetabolism throughout. 9 mm right thyroid nodule is hypermetabolic. Thyroid ultrasound recommended to further evaluate. Mucosal thickening with air-fluid level in the left maxillary sinus. 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  08/12/2019 PET/CT scan ([id number]) revealed disease progression in neck, chest, abdomen, pelvis and interval development of pulmonary lesions  03/23/2020 CT C/A/P ([id number]) ([id number]) revealed. "1. Significant positive response to therapy. No new or progressive disease."   PLAN: Patient's labs done today were discussed with her in detail CBC within normal limits without any new lymphocytosis no anemia no thrombocytopenia. CMP stable LDH borderline. No obvious lab or clinical symptoms suggestive of CLL progression. Patient had a renal ultrasound done by Dr. Carolin Sicks on 04/05/2022 which reported possible pelvic mass.  Discussed with patient and ordered CT chest abdomen pelvis to evaluate possible abdominal pelvic mass. No constitutional symptoms. No indication for additional CLL treatment at this time. Discussed importance of staying up-to-date with her vaccinations including the flu shot new COVID-19 booster, RSV and other age-appropriate vaccinations.  FOLLOW UP:    Check-out note: CT chest abd pelvis without IV contrast in 1 week Phone visit with [name] in 2 weeks   The total time spent in the appointment was 20 minutes*.  All of the  patient's questions were answered with apparent satisfaction. The patient knows to call the clinic with any problems, questions or concerns.   Sullivan Lone MD MS AAHIVMS [facility] Hematology/Oncology Physician [facility]  .*Total Encounter Time as defined by the Centers for Medicare and Medicaid Services includes, in addition to the face-to-face time of a patient visit (documented in the note above) non-face-to-face time: obtaining and reviewing outside history, ordering and reviewing medications, tests or procedures, care coordination (communications with other health care professionals or caregivers) and documentation in the medical record.

## 2022-05-16 ENCOUNTER — Ambulatory Visit (HOSPITAL_COMMUNITY)
Admission: RE | Admit: 2022-05-16 | Discharge: 2022-05-16 | Disposition: A | Discharge status: Home / Self Care | Payer: Medicare PPO | Source: Ambulatory Visit | Attending: Hematology | Admitting: Hematology

## 2022-05-16 DIAGNOSIS — J984 Other disorders of lung: Secondary | ICD-10-CM | POA: Diagnosis not present

## 2022-05-16 DIAGNOSIS — R1909 Other intra-abdominal and pelvic swelling, mass and lump: Secondary | ICD-10-CM | POA: Diagnosis not present

## 2022-05-16 DIAGNOSIS — R1900 Intra-abdominal and pelvic swelling, mass and lump, unspecified site: Secondary | ICD-10-CM | POA: Insufficient documentation

## 2022-05-22 ENCOUNTER — Inpatient Hospital Stay (HOSPITAL_BASED_OUTPATIENT_CLINIC_OR_DEPARTMENT_OTHER): Payer: Medicare PPO | Admitting: Hematology

## 2022-05-22 DIAGNOSIS — C9110 Chronic lymphocytic leukemia of B-cell type not having achieved remission: Secondary | ICD-10-CM | POA: Diagnosis not present

## 2022-05-22 NOTE — Progress Notes (Signed)
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  INTERVAL HISTORY:   Erica Carlson is a 76 y.o. 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 .I connected with Erica Carlson on 05/22/2022 at  3:30 PM EST by telephone visit and verified that I am speaking with the correct person using two identifiers.   Patient was last seen by me on 05/09/2022 and was doing well without any new medical concerns.   She reports she has been doing well since our last visit. She denies abnormal bowl moments, fatigue, headache, and abdominal pain.   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 After going over the treatment options, she is interested in starting Acalabrutinib. She is traveling during Christmas, so she wants to start Acalabrutinib in January 2024.   I discussed the limitations, risks, security and privacy concerns of performing an evaluation and management service by telemedicine and the availability of in-person appointments. I also discussed with the patient that there may be a patient responsible charge related to this service. The patient expressed understanding and agreed to proceed.   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    Pneumonia     SURGICAL HISTORY: Past Surgical History:  Procedure Laterality Date   ABDOMINAL HYSTERECTOMY     AXILLARY LYMPH NODE BIOPSY Right 02/07/2016   back injections     BREAST SURGERY     COLONOSCOPY  2009   EYE SURGERY     retinal tear repair   KNEE SURGERY     torn ALC per pt    SOCIAL HISTORY: Social History   Socioeconomic History   Marital status: Widowed    Spouse name: n/a   Number of children: 6   Years of education: 14   Highest education level: Not on file  Occupational History   Occupation: self-employed    Employer: IT trainer    Comment: alterations and design  Tobacco Use   Smoking status: Never   Smokeless tobacco: Never  Vaping Use   Vaping Use: Never used  Substance and Sexual Activity   Alcohol use: No    Alcohol/week: 0.0 standard drinks of alcohol   Drug use: No   Sexual activity: Never  Other Topics Concern   Not on file  Social History Narrative   Lives alone.   Her adult children live nearby.   Social Determinants of Health   Financial Resource Strain: Low Risk  (09/27/2020)   Overall Financial Resource Strain (CARDIA)    Difficulty of Paying Living Expenses: Not hard at all  Food Insecurity: No Food Insecurity (09/27/2020)  Hunger Vital Sign    Worried About Running Out of Food in the Last Year: Never true    Ran Out of Food in the Last Year: Never true  Transportation Needs: No Transportation Needs (09/27/2020)   PRAPARE - Hydrologist (Medical): No    Lack of Transportation (Non-Medical): No  Physical Activity: Inactive (09/27/2020)   Exercise Vital Sign    Days of Exercise per Week: 0 days    Minutes of Exercise per Session: 0 min  Stress: No Stress Concern Present (09/27/2020)   [place]    Feeling of Stress : Not at all  Social Connections: Not on file  Intimate Partner Violence: Not At Risk (09/27/2020)   Humiliation, Afraid, Rape, and Kick questionnaire    Fear of Current or Ex-Partner: No    Emotionally Abused: No    Physically Abused: No    Sexually Abused: No    FAMILY HISTORY: Family History  Problem Relation Age of Onset   Breast cancer Sister    Hyperlipidemia Son    Schizophrenia Mother    Colon cancer Neg Hx    Colon polyps Neg Hx    Esophageal cancer Neg Hx    Rectal cancer Neg Hx    Stomach cancer Neg Hx     ALLERGIES:  is allergic to crestor [rosuvastatin], hctz [hydrochlorothiazide], and lipitor [atorvastatin].  MEDICATIONS:  Current Outpatient Medications  Medication Sig Dispense Refill   amLODipine (NORVASC) 10 MG tablet TAKE 1 TABLET(10 MG) BY MOUTH DAILY 90 tablet 3   Cholecalciferol (VITAMIN D-3) 5000 units TABS Take 1 tablet by mouth daily.     diphenhydrAMINE (BENADRYL) 25 mg capsule Take 25 mg by mouth every 6 (six) hours as needed for itching or allergies.     lisinopril (ZESTRIL) 10 MG tablet Take 0.5 tablets (5 mg total) by mouth daily. 45 tablet 3   polyethylene glycol (MIRALAX / GLYCOLAX) 17 g packet Take 17 g by mouth daily as needed.     No current facility-administered medications for this visit.    REVIEW OF SYSTEMS:   10 Point review of Systems was done is negative except as noted above.   PHYSICAL EXAMINATION: Telemedicine visit  LABORATORY DATA:  I have reviewed the data as listed  .    Latest Ref Rng & Units 05/09/2022   12:56 PM 10/26/2021    1:30 PM 10/02/2021    1:16 PM  CBC  WBC 4.0 - 10.5 K/uL 5.7  4.9  5.7   Hemoglobin 12.0 - 15.0 g/dL 12.3  11.2  11.9   Hematocrit 36.0 - 46.0 % 39.0  36.6  37.0   Platelets 150 - 400 K/uL 232  213  224.0     .    Latest Ref Rng & Units 05/09/2022   12:56 PM 11/14/2021    2:56 PM 10/26/2021    1:30 PM  CMP  Glucose 70 - 99 mg/dL 90  85  99   BUN 8 - 23  mg/dL '22  26  29   '$ Creatinine 0.44 - 1.00 mg/dL 1.16  1.44  1.50   Sodium 135 - 145 mmol/L 138  139  139   Potassium 3.5 - 5.1 mmol/L 4.0  4.0  3.9   Chloride 98 - 111 mmol/L 106  104  108   CO2 22 - 32 mmol/L '29  27  28   '$ Calcium 8.9 - 10.3 mg/dL 9.8  9.6  9.3   Total Protein 6.5 - 8.1 g/dL 7.3   6.7   Total Bilirubin 0.3 - 1.2 mg/dL 0.4   0.3   Alkaline Phos 38 - 126 U/L 68   70   AST 15 - 41 U/L 14   14   ALT 0 - 44 U/L 11   13    . Lab Results  Component Value Date   LDH 213 (H) 05/09/2022     09/22/18 Right Axillary LN Biopsy:    07/31/18 Molecular Pathology:     02/21/16 Biopsy:    RADIOGRAPHIC STUDIES: I have personally reviewed the radiological images as listed and agreed with the findings in the report. CT CHEST ABDOMEN PELVIS WO CONTRAST  Result Date: 05/18/2022 CLINICAL DATA:  Right pelvic mass noted on renal ultrasound. Worsening renal insufficiency. History of chronic lymphocytic leukemia. * Tracking Code: BO * EXAM: CT CHEST, ABDOMEN AND PELVIS WITHOUT CONTRAST TECHNIQUE: Multidetector CT imaging of the chest, abdomen and pelvis was performed following the standard protocol without IV contrast. RADIATION DOSE REDUCTION: This exam was performed according to the departmental dose-optimization program which includes automated exposure control, adjustment of the mA and/or kV according to patient size and/or use of iterative reconstruction technique. COMPARISON:  04/05/2022 renal ultrasound.  Prior CT of 10/07/2020 FINDINGS: CT CHEST FINDINGS Cardiovascular: Aortic atherosclerosis. Tortuous thoracic aorta. Mild cardiomegaly, without pericardial effusion. Lad and right coronary artery calcification. Aortic valve calcification. Mediastinum/Nodes: Small low jugular and supraclavicular nodes are suspicious based on multiplicity. Progressive bilateral axillary and developing subpectoral adenopathy. Right axillary nodes of up to 3.0 x 1.8 cm on 11/02. This node measured 1.2 x  1.0 cm on the prior. Developing mediastinal adenopathy, with an index right paratracheal node measuring 1.1 cm on 23/2 versus approximately 4 mm on the prior. Hilar regions poorly evaluated without intravenous contrast. Lungs/Pleura: No pleural fluid. Lower lobe predominant bronchial wall thickening. Lingular scarring. Musculoskeletal: No acute osseous abnormality. CT ABDOMEN PELVIS FINDINGS Hepatobiliary: Normal liver. Normal gallbladder, without biliary ductal dilatation. Pancreas: Normal, without mass or ductal dilatation. Spleen: Normal in size, without focal abnormality. Adrenals/Urinary Tract: Normal adrenal glands. 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 No renal calculi. No hydronephrosis. No bladder calculi. Stomach/Bowel: Normal stomach, without wall thickening. Normal colon, appendix, and terminal ileum. Normal small bowel. Vascular/Lymphatic: Aortic atherosclerosis. Progressive abdominal retroperitoneal adenopathy. 1.0 cm aortocaval node on 69/2 measured 5 mm on the prior Right external iliac nodal mass which likely accounted for the ultrasound abnormality, measures 6.8 x 3.3 cm on 97/2 versus 2.9 x 1.2 cm on the prior. Developing adenopathy within the posterior abdominal retroperitoneum bilaterally, including on image 70 and 72 of series 2. Reproductive: Uterine atrophy or partial hysterectomy. No adnexal mass. Other: No significant free fluid. No free intraperitoneal air. Mild pelvic floor laxity. No evidence of omental or peritoneal disease. Musculoskeletal: Lumbosacral spondylosis. IMPRESSION: 1. Significant progression of leukemia/lymphoma as evidenced by progressive adenopathy within the chest, abdomen, and pelvis. A dominant right external iliac nodal mass likely accounted for the ultrasound abnormality. 2. Incidental findings, including: Coronary artery atherosclerosis. Aortic Atherosclerosis (ICD10-I70.0). 3. Aortic valvular calcifications. Consider echocardiography to evaluate for  valvular dysfunction. Electronically Signed   By: Abigail Miyamoto M.D.   On: 05/18/2022 18:04     ASSESSMENT & PLAN:   76 y.o. female with  1. Chronic Lymphocytic Leukemia  02/21/16 Right Axillary LN biopsy revealed SLL, the initial diagnosis. 07/31/18 FISH CLL Prognostic Panel revealed:52.0% cells with 11q deletion Labs upon  initial presentation from 09/22/18, WBC at 16.8k, HGB at 10.9, PLT at 272k. ANC at 1.2k, Lymphs at 11.3k, Monocytes at 4.1k. 09/22/18 Right Axillary LN Biopsy revealed SLL, ruling out a transformation event  08/15/18 PET/CT revealed Bulky lymphadenopathy in the neck, chest, abdomen, and pelvis demonstrating low level hypermetabolism throughout. 9 mm right thyroid nodule is hypermetabolic. Thyroid ultrasound recommended to further evaluate. Mucosal thickening with air-fluid level in the left maxillary sinus. 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  08/12/2019 PET/CT scan ([id number]) revealed disease progression in neck, chest, abdomen, pelvis and interval development of pulmonary lesions  03/23/2020 CT C/A/P ([id number]) ([id number]) revealed. "1. Significant positive response to therapy. No new or progressive disease."   PLAN: -Discussed lab results from 05/09/2022. Labs showed stable CBC and CMP. -Discussed CT scan results which showed enlarged lymph nodes near right side of her pelvis, armpit, and near chest. Largest lymph node is in the right pelvis.  -Discussed treatment options for her progressive CLL: 1. Combination of chemotherapy/Immunotherapy sucha as Bendamustine/Rituxan. 2. Take daily oral medication called Acalabrutinib. -Educated the patient on Acalabrutinib.   -Patient is interested in starting Acalabrutinib in January 2024 after holidays due to traveling.  -Recommended getting the influenza vaccine, COVID-19 booster, RSV vaccine, and other age-appropriate vaccines.   FOLLOW UP: RTC with [name] with labs in last week of Jan 2024  The  total time spent in the appointment was 23 minutes* .  All of the patient's questions were answered with apparent satisfaction. The patient knows to call the clinic with any problems, questions or concerns.   Zettie Cooley, am acting as a scribe for Sullivan Lone, MD.  Sullivan Lone MD [place] AAHIVMS [facility] Hematology/Oncology Physician [facility]  .*Total Encounter Time as defined by the Centers for Medicare and Medicaid Services includes, in addition to the face-to-face time of a patient visit (documented in the note above) non-face-to-face time: obtaining and reviewing outside history, ordering and reviewing medications, tests or procedures, care coordination (communications with other health care professionals or caregivers) and documentation in the medical record.

## 2022-05-28 MED ORDER — ACALABRUTINIB 100 MG PO CAPS
100.0000 mg | ORAL_CAPSULE | Freq: Two times a day (BID) | ORAL | 1 refills | Status: DC
  Filled 2022-05-28: qty 60, 30d supply, fill #0

## 2022-05-29 ENCOUNTER — Other Ambulatory Visit (HOSPITAL_COMMUNITY): Payer: Self-pay

## 2022-05-29 ENCOUNTER — Telehealth: Payer: Self-pay | Admitting: Pharmacy Technician

## 2022-05-29 ENCOUNTER — Telehealth: Payer: Self-pay | Admitting: Pharmacist

## 2022-05-29 DIAGNOSIS — C9110 Chronic lymphocytic leukemia of B-cell type not having achieved remission: Secondary | ICD-10-CM

## 2022-05-29 MED ORDER — CALQUENCE 100 MG PO TABS
100.0000 mg | ORAL_TABLET | Freq: Two times a day (BID) | ORAL | 1 refills | Status: DC
  Filled 2022-05-29 – 2022-07-04 (×2): qty 60, 30d supply, fill #0
  Filled 2022-07-24: qty 60, 30d supply, fill #1

## 2022-05-29 NOTE — Telephone Encounter (Signed)
Oral Oncology Patient Advocate Encounter  After completing a benefits investigation, prior authorization for Calquence is not required at this time through [facility].  Patient's copay is $100.     Lady Deutscher, CPhT-Adv Oncology Pharmacy Patient [place] Direct Number: [phone]  Fax: [phone]

## 2022-05-29 NOTE — Telephone Encounter (Addendum)
Oral Oncology Pharmacist Encounter  Received new prescription for Calquence (acalabrutinib) for the treatment of CLL, planned duration until disease progression or unacceptable drug toxicity. Per MD note patient does not want to start therapy until January 2024.   CBC w/ Diff, CMP and LDH from 05/09/22 assessed, noted pt with Scr of 1.16 mg/dL (CrCL ~53.4 ml/min). Patient with LDH at 213 U/L. No baseline dose adjustments required. Prescription dose and frequency assessed for appropriateness.   Current medication list in Epic reviewed, no relevant/significant DDIs with Calquence identified.  Evaluated chart and no patient barriers to medication adherence noted.   Patient agreement for treatment documented in MD note on 05/22/22.  Prescription has been e-scribed to the [facility] for benefits analysis and approval.  Oral Oncology Clinic will continue to follow for insurance authorization, copayment issues, initial counseling and start date.  Leron Croak, PharmD, BCPS, BCOP Hematology/Oncology Clinical Pharmacist [facility] and [place] [phone] 05/29/2022 8:16 AM

## 2022-06-07 DIAGNOSIS — M5136 Other intervertebral disc degeneration, lumbar region: Secondary | ICD-10-CM | POA: Diagnosis not present

## 2022-06-07 DIAGNOSIS — M9903 Segmental and somatic dysfunction of lumbar region: Secondary | ICD-10-CM | POA: Diagnosis not present

## 2022-06-07 DIAGNOSIS — M47814 Spondylosis without myelopathy or radiculopathy, thoracic region: Secondary | ICD-10-CM | POA: Diagnosis not present

## 2022-06-07 DIAGNOSIS — M9901 Segmental and somatic dysfunction of cervical region: Secondary | ICD-10-CM | POA: Diagnosis not present

## 2022-06-07 DIAGNOSIS — M9902 Segmental and somatic dysfunction of thoracic region: Secondary | ICD-10-CM | POA: Diagnosis not present

## 2022-06-07 DIAGNOSIS — M5137 Other intervertebral disc degeneration, lumbosacral region: Secondary | ICD-10-CM | POA: Diagnosis not present

## 2022-06-07 DIAGNOSIS — M9905 Segmental and somatic dysfunction of pelvic region: Secondary | ICD-10-CM | POA: Diagnosis not present

## 2022-06-07 DIAGNOSIS — S138XXA Sprain of joints and ligaments of other parts of neck, initial encounter: Secondary | ICD-10-CM | POA: Diagnosis not present

## 2022-06-13 ENCOUNTER — Ambulatory Visit (INDEPENDENT_AMBULATORY_CARE_PROVIDER_SITE_OTHER): Payer: Medicare PPO

## 2022-06-13 DIAGNOSIS — Z23 Encounter for immunization: Secondary | ICD-10-CM

## 2022-06-14 DIAGNOSIS — M47814 Spondylosis without myelopathy or radiculopathy, thoracic region: Secondary | ICD-10-CM | POA: Diagnosis not present

## 2022-06-14 DIAGNOSIS — M9901 Segmental and somatic dysfunction of cervical region: Secondary | ICD-10-CM | POA: Diagnosis not present

## 2022-06-14 DIAGNOSIS — M9903 Segmental and somatic dysfunction of lumbar region: Secondary | ICD-10-CM | POA: Diagnosis not present

## 2022-06-14 DIAGNOSIS — M9905 Segmental and somatic dysfunction of pelvic region: Secondary | ICD-10-CM | POA: Diagnosis not present

## 2022-06-14 DIAGNOSIS — M5137 Other intervertebral disc degeneration, lumbosacral region: Secondary | ICD-10-CM | POA: Diagnosis not present

## 2022-06-14 DIAGNOSIS — M5136 Other intervertebral disc degeneration, lumbar region: Secondary | ICD-10-CM | POA: Diagnosis not present

## 2022-06-14 DIAGNOSIS — M9902 Segmental and somatic dysfunction of thoracic region: Secondary | ICD-10-CM | POA: Diagnosis not present

## 2022-06-14 DIAGNOSIS — S138XXA Sprain of joints and ligaments of other parts of neck, initial encounter: Secondary | ICD-10-CM | POA: Diagnosis not present

## 2022-06-28 ENCOUNTER — Telehealth: Payer: Self-pay | Admitting: Pharmacy Technician

## 2022-06-28 ENCOUNTER — Other Ambulatory Visit (HOSPITAL_COMMUNITY): Payer: Self-pay

## 2022-06-28 NOTE — Telephone Encounter (Addendum)
Oral Oncology Patient Advocate Encounter  Was successful in securing patient a $8,000 grant from [facility] to provide copayment coverage for Calquence.  This will keep the out of pocket expense at $0.     [employer] ID: [id number]  I have spoken with the patient.   The billing information is as follows and has been shared with [facility].    RxBin: [id number] PCN: [id number] Member ID: [id number] Group ID: [id number] Dates of Eligibility: 05/29/22 through 05/29/23  Fund:  [place], [place] [facility] Patient [place] Direct Number: [phone]  Fax: [phone]

## 2022-06-29 DIAGNOSIS — M5136 Other intervertebral disc degeneration, lumbar region: Secondary | ICD-10-CM | POA: Diagnosis not present

## 2022-06-29 DIAGNOSIS — M9903 Segmental and somatic dysfunction of lumbar region: Secondary | ICD-10-CM | POA: Diagnosis not present

## 2022-06-29 DIAGNOSIS — M9905 Segmental and somatic dysfunction of pelvic region: Secondary | ICD-10-CM | POA: Diagnosis not present

## 2022-06-29 DIAGNOSIS — S138XXA Sprain of joints and ligaments of other parts of neck, initial encounter: Secondary | ICD-10-CM | POA: Diagnosis not present

## 2022-06-29 DIAGNOSIS — M9901 Segmental and somatic dysfunction of cervical region: Secondary | ICD-10-CM | POA: Diagnosis not present

## 2022-06-29 DIAGNOSIS — M9902 Segmental and somatic dysfunction of thoracic region: Secondary | ICD-10-CM | POA: Diagnosis not present

## 2022-06-29 DIAGNOSIS — M5137 Other intervertebral disc degeneration, lumbosacral region: Secondary | ICD-10-CM | POA: Diagnosis not present

## 2022-06-29 DIAGNOSIS — M47814 Spondylosis without myelopathy or radiculopathy, thoracic region: Secondary | ICD-10-CM | POA: Diagnosis not present

## 2022-07-04 ENCOUNTER — Other Ambulatory Visit: Payer: Self-pay

## 2022-07-04 ENCOUNTER — Other Ambulatory Visit (HOSPITAL_COMMUNITY): Payer: Self-pay

## 2022-07-04 NOTE — Telephone Encounter (Signed)
Oral Chemotherapy Pharmacist Encounter  I spoke with patient for overview of: Calquence (acalabrutinib) for the treatment of CLL, planned duration until disease progression or unacceptable toxicity.   Counseled patient on administration, dosing, side effects, monitoring, drug-food interactions, safe handling, storage, and disposal.  Patient will take Calquence '100mg'$  tablets, 1 tablet by mouth approximately 12 hours apart, with or with out food, with a glass of water.  Calquence start date: 07/16/22  Adverse effects include but are not limited to: headache, diarrhea, fatigue, rash, muscle pain, decreased blood counts.   Headache: Patient informed she can utilize APAP PRN for headache relief. Also discussed use that headaches caused by Calquence typically are responsive to caffeine, and this can be used as needed as well.  Diarrhea: Patient will obtain Imodium (loperamide) to have on hand if they experience diarrhea. Patient knows to alert the office of 4 or more loose stools above baseline.  Reviewed with patient importance of keeping a medication schedule and plan for any missed doses. No barriers to medication adherence identified.  Medication reconciliation performed and medication/allergy list updated.  All questions answered.  Erica Carlson voiced understanding and appreciation.   Medication education handout placed in mail for patient. Patient knows to call the office with questions or concerns. Oral Chemotherapy Clinic phone number provided to patient.   Leron Croak, PharmD, BCPS, BCOP Hematology/Oncology Clinical Pharmacist [facility] and [place] [phone] 07/04/2022 11:05 AM

## 2022-07-10 ENCOUNTER — Other Ambulatory Visit (HOSPITAL_COMMUNITY): Payer: Self-pay

## 2022-07-12 ENCOUNTER — Telehealth: Payer: Self-pay | Admitting: Pharmacy Technician

## 2022-07-12 ENCOUNTER — Other Ambulatory Visit (HOSPITAL_COMMUNITY): Payer: Self-pay

## 2022-07-12 NOTE — Telephone Encounter (Signed)
Oral Oncology Patient Advocate Encounter   Was successful in securing patient an $3,250 grant from Patient [place] [facility]) to provide copayment coverage for Calquence.  This will keep the out of pocket expense at $0.     I have spoken with the patient.    The billing information is as follows and has been shared with [place].   Member ID: [id number] [place]: [id number] Dates of Eligibility: 04/13/22 through 07/12/23  Fund:  [place], CPhT-Adv Oncology Pharmacy Patient [place] Direct Number: [phone]  Fax: [phone]'

## 2022-07-20 DIAGNOSIS — M9902 Segmental and somatic dysfunction of thoracic region: Secondary | ICD-10-CM | POA: Diagnosis not present

## 2022-07-20 DIAGNOSIS — S138XXA Sprain of joints and ligaments of other parts of neck, initial encounter: Secondary | ICD-10-CM | POA: Diagnosis not present

## 2022-07-20 DIAGNOSIS — M5137 Other intervertebral disc degeneration, lumbosacral region: Secondary | ICD-10-CM | POA: Diagnosis not present

## 2022-07-20 DIAGNOSIS — M9901 Segmental and somatic dysfunction of cervical region: Secondary | ICD-10-CM | POA: Diagnosis not present

## 2022-07-20 DIAGNOSIS — M47814 Spondylosis without myelopathy or radiculopathy, thoracic region: Secondary | ICD-10-CM | POA: Diagnosis not present

## 2022-07-20 DIAGNOSIS — M9903 Segmental and somatic dysfunction of lumbar region: Secondary | ICD-10-CM | POA: Diagnosis not present

## 2022-07-20 DIAGNOSIS — M9905 Segmental and somatic dysfunction of pelvic region: Secondary | ICD-10-CM | POA: Diagnosis not present

## 2022-07-20 DIAGNOSIS — M5136 Other intervertebral disc degeneration, lumbar region: Secondary | ICD-10-CM | POA: Diagnosis not present

## 2022-07-24 ENCOUNTER — Other Ambulatory Visit (HOSPITAL_COMMUNITY): Payer: Self-pay

## 2022-08-03 ENCOUNTER — Other Ambulatory Visit: Payer: Self-pay | Admitting: *Deleted

## 2022-08-03 DIAGNOSIS — C9110 Chronic lymphocytic leukemia of B-cell type not having achieved remission: Secondary | ICD-10-CM

## 2022-08-06 ENCOUNTER — Other Ambulatory Visit: Payer: Self-pay

## 2022-08-06 ENCOUNTER — Inpatient Hospital Stay: Payer: Medicare PPO | Attending: Hematology

## 2022-08-06 ENCOUNTER — Inpatient Hospital Stay: Payer: Medicare PPO | Admitting: Hematology

## 2022-08-06 ENCOUNTER — Other Ambulatory Visit (HOSPITAL_COMMUNITY): Payer: Self-pay

## 2022-08-06 DIAGNOSIS — C9110 Chronic lymphocytic leukemia of B-cell type not having achieved remission: Secondary | ICD-10-CM

## 2022-08-06 LAB — CBC WITH DIFFERENTIAL (CANCER CENTER ONLY)
Abs Immature Granulocytes: 0.02 10*3/uL (ref 0.00–0.07)
Basophils Absolute: 0 10*3/uL (ref 0.0–0.1)
Basophils Relative: 0 %
Eosinophils Absolute: 0.2 10*3/uL (ref 0.0–0.5)
Eosinophils Relative: 3 %
HCT: 37.9 % (ref 36.0–46.0)
Hemoglobin: 11.8 g/dL — ABNORMAL LOW (ref 12.0–15.0)
Immature Granulocytes: 0 %
Lymphocytes Relative: 56 %
Lymphs Abs: 4.4 10*3/uL — ABNORMAL HIGH (ref 0.7–4.0)
MCH: 26.5 pg (ref 26.0–34.0)
MCHC: 31.1 g/dL (ref 30.0–36.0)
MCV: 85.2 fL (ref 80.0–100.0)
Monocytes Absolute: 0.5 10*3/uL (ref 0.1–1.0)
Monocytes Relative: 6 %
Neutro Abs: 2.7 10*3/uL (ref 1.7–7.7)
Neutrophils Relative %: 35 %
Platelet Count: 276 10*3/uL (ref 150–400)
RBC: 4.45 MIL/uL (ref 3.87–5.11)
RDW: 14.6 % (ref 11.5–15.5)
WBC Count: 7.8 10*3/uL (ref 4.0–10.5)
nRBC: 0 % (ref 0.0–0.2)

## 2022-08-06 LAB — CMP (CANCER CENTER ONLY)
ALT: 9 U/L (ref 0–44)
AST: 11 U/L — ABNORMAL LOW (ref 15–41)
Albumin: 3.9 g/dL (ref 3.5–5.0)
Alkaline Phosphatase: 71 U/L (ref 38–126)
Anion gap: 5 (ref 5–15)
BUN: 22 mg/dL (ref 8–23)
CO2: 27 mmol/L (ref 22–32)
Calcium: 9.6 mg/dL (ref 8.9–10.3)
Chloride: 107 mmol/L (ref 98–111)
Creatinine: 1.25 mg/dL — ABNORMAL HIGH (ref 0.44–1.00)
GFR, Estimated: 45 mL/min — ABNORMAL LOW (ref >60)
Glucose, Bld: 92 mg/dL (ref 70–99)
Potassium: 4.1 mmol/L (ref 3.5–5.1)
Sodium: 139 mmol/L (ref 135–145)
Total Bilirubin: 0.3 mg/dL (ref 0.3–1.2)
Total Protein: 6.9 g/dL (ref 6.5–8.1)

## 2022-08-06 LAB — LACTATE DEHYDROGENASE: LDH: 158 U/L (ref 98–192)

## 2022-08-06 MED ORDER — CALQUENCE 100 MG PO TABS
100.0000 mg | ORAL_TABLET | Freq: Two times a day (BID) | ORAL | 5 refills | Status: DC
  Filled 2022-08-06: qty 60, 30d supply, fill #0
  Filled 2022-08-30: qty 60, 30d supply, fill #1
  Filled 2022-09-25: qty 60, 30d supply, fill #2
  Filled 2022-10-25: qty 60, 30d supply, fill #3
  Filled 2022-12-05: qty 60, 30d supply, fill #4
  Filled 2023-01-07: qty 60, 30d supply, fill #5

## 2022-08-06 NOTE — Progress Notes (Signed)
HEMATOLOGY/ONCOLOGY CLINIC NOTE  Date of Service: 08/06/22  Patient Care Team: Tower, Wynelle Fanny, MD as PCP - General (Family Medicine) Galvin Proffer, Wasta as Consulting Physician (Optometry) Irene Limbo, Cloria Spring, MD as Consulting Physician (Hematology)  CHIEF COMPLAINTS/PURPOSE OF CONSULTATION:  Follow-up for continued evaluation and management of CLL  HISTORY OF PRESENTING ILLNESS:  Please see previous notes for details on initial presentation.  INTERVAL HISTORY:   Erica Carlson is a 77 y.o. 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  I had a phone visit with the patient on 05/22/2022 and she was doing well overall. During the last visit, we discussed of next treatment option of starting Acalabrutinib and patient agreed to start this in January of 2024.   Patient reports she has been doing well overall without any new medical concerns since our last visit. She started taking Acalabrutinib on July 16, 2022. She denies any severe toxicities with Acalabrutinib, except occasional mild headache.   Patient denies fever, chills, night sweats,enlarged lymph nodes, infection issues, unexpected weight loss, back pain, abdominal pain, chest pain, or leg swelling.   She has received her influenza vaccine, but denies COVID-19 Booster or RSV vaccine.   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    Pneumonia     SURGICAL HISTORY: Past Surgical History:  Procedure Laterality Date   ABDOMINAL HYSTERECTOMY     AXILLARY LYMPH NODE BIOPSY Right 02/07/2016   back injections     BREAST SURGERY     COLONOSCOPY  2009   EYE SURGERY     retinal tear repair   KNEE SURGERY     torn ALC per pt    SOCIAL HISTORY: Social History   Socioeconomic  History   Marital status: Widowed    Spouse name: n/a   Number of children: 6   Years of education: 14   Highest education level: Not on file  Occupational History   Occupation: self-employed    Employer: IT trainer    Comment: alterations and design  Tobacco Use   Smoking status: Never   Smokeless tobacco: Never  Vaping Use   Vaping Use: Never used  Substance and Sexual Activity   Alcohol use: No    Alcohol/week: 0.0 standard drinks of alcohol   Drug use: No   Sexual activity: Never  Other Topics Concern   Not on file  Social History Narrative   Lives alone.   Her adult children live nearby.   Social Determinants of Health   Financial Resource Strain: Low Risk  (09/27/2020)   Overall Financial Resource Strain (CARDIA)    Difficulty of Paying Living Expenses: Not hard at all  Food Insecurity: No Food Insecurity (09/27/2020)   Hunger Vital Sign    Worried About Running Out of Food in the Last Year: Never true    Ran Out of Food in the Last Year: Never true  Transportation Needs: No Transportation Needs (09/27/2020)   PRAPARE - Hydrologist (Medical): No    Lack of Transportation (Non-Medical): No  Physical Activity: Inactive (09/27/2020)   Exercise Vital Sign    Days of Exercise per Week: 0 days  Minutes of Exercise per Session: 0 min  Stress: No Stress Concern Present (09/27/2020)   Wann    Feeling of Stress : Not at all  Social Connections: Not on file  Intimate Partner Violence: Not At Risk (09/27/2020)   Humiliation, Afraid, Rape, and Kick questionnaire    Fear of Current or Ex-Partner: No    Emotionally Abused: No    Physically Abused: No    Sexually Abused: No    FAMILY HISTORY: Family History  Problem Relation Age of Onset   Breast cancer Sister    Hyperlipidemia Son    Schizophrenia Mother    Colon cancer Neg Hx    Colon polyps Neg Hx     Esophageal cancer Neg Hx    Rectal cancer Neg Hx    Stomach cancer Neg Hx     ALLERGIES:  is allergic to crestor [rosuvastatin], hctz [hydrochlorothiazide], and lipitor [atorvastatin].  MEDICATIONS:  Current Outpatient Medications  Medication Sig Dispense Refill   acalabrutinib maleate (CALQUENCE) 100 MG tablet Take 1 tablet (100 mg total) by mouth 2 (two) times daily. 60 tablet 1   amLODipine (NORVASC) 10 MG tablet TAKE 1 TABLET(10 MG) BY MOUTH DAILY 90 tablet 3   Cholecalciferol (VITAMIN D-3) 5000 units TABS Take 1 tablet by mouth daily.     diphenhydrAMINE (BENADRYL) 25 mg capsule Take 25 mg by mouth every 6 (six) hours as needed for itching or allergies.     lisinopril (ZESTRIL) 10 MG tablet Take 0.5 tablets (5 mg total) by mouth daily. 45 tablet 3   polyethylene glycol (MIRALAX / GLYCOLAX) 17 g packet Take 17 g by mouth daily as needed.     No current facility-administered medications for this visit.    REVIEW OF SYSTEMS:   10 Point review of Systems was done is negative except as noted above.   PHYSICAL EXAMINATION: .BP (!) 153/75   Pulse 81   Temp (!) 97.5 F (36.4 C)   Resp 17   Wt 181 lb 12.8 oz (82.5 kg)   SpO2 99%   BMI 34.92 kg/m  . GENERAL:alert, in no acute distress and comfortable SKIN: no acute rashes, no significant lesions EYES: conjunctiva are pink and non-injected, sclera anicteric OROPHARYNX: MMM, no exudates, no oropharyngeal erythema or ulceration NECK: supple, no JVD LYMPH:  no palpable lymphadenopathy in the cervical, axillary or inguinal regions LUNGS: clear to auscultation b/l with normal respiratory effort HEART: regular rate & rhythm ABDOMEN:  normoactive bowel sounds , non tender, not distended. Extremity: no pedal edema PSYCH: alert & oriented x 3 with fluent speech NEURO: no focal motor/sensory deficits   LABORATORY DATA:  I have reviewed the data as listed  .    Latest Ref Rng & Units 08/06/2022   11:58 AM 05/09/2022   12:56 PM  10/26/2021    1:30 PM  CBC  WBC 4.0 - 10.5 K/uL 7.8  5.7  4.9   Hemoglobin 12.0 - 15.0 g/dL 11.8  12.3  11.2   Hematocrit 36.0 - 46.0 % 37.9  39.0  36.6   Platelets 150 - 400 K/uL 276  232  213     .    Latest Ref Rng & Units 08/06/2022   11:58 AM 05/09/2022   12:56 PM 11/14/2021    2:56 PM  CMP  Glucose 70 - 99 mg/dL 92  90  85   BUN 8 - 23 mg/dL 22  22  26  Creatinine 0.44 - 1.00 mg/dL 1.25  1.16  1.44   Sodium 135 - 145 mmol/L 139  138  139   Potassium 3.5 - 5.1 mmol/L 4.1  4.0  4.0   Chloride 98 - 111 mmol/L 107  106  104   CO2 22 - 32 mmol/L '27  29  27   '$ Calcium 8.9 - 10.3 mg/dL 9.6  9.8  9.6   Total Protein 6.5 - 8.1 g/dL 6.9  7.3    Total Bilirubin 0.3 - 1.2 mg/dL 0.3  0.4    Alkaline Phos 38 - 126 U/L 71  68    AST 15 - 41 U/L 11  14    ALT 0 - 44 U/L 9  11     . Lab Results  Component Value Date   LDH 158 08/06/2022     09/22/18 Right Axillary LN Biopsy:    07/31/18 Molecular Pathology:     02/21/16 Biopsy:    RADIOGRAPHIC STUDIES: I have personally reviewed the radiological images as listed and agreed with the findings in the report. No results found.   ASSESSMENT & PLAN:   77 y.o. female with  1. Chronic Lymphocytic Leukemia  02/21/16 Right Axillary LN biopsy revealed SLL, the initial diagnosis. 07/31/18 FISH CLL Prognostic Panel revealed:52.0% cells with 11q deletion Labs upon initial presentation from 09/22/18, WBC at 16.8k, HGB at 10.9, PLT at 272k. ANC at 1.2k, Lymphs at 11.3k, Monocytes at 4.1k. 09/22/18 Right Axillary LN Biopsy revealed SLL, ruling out a transformation event  08/15/18 PET/CT revealed Bulky lymphadenopathy in the neck, chest, abdomen, and pelvis demonstrating low level hypermetabolism throughout. 9 mm right thyroid nodule is hypermetabolic. Thyroid ultrasound recommended to further evaluate. Mucosal thickening with air-fluid level in the left maxillary sinus. 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  08/12/2019 PET/CT scan ([id number]) revealed disease progression in neck, chest, abdomen, pelvis and interval development of pulmonary lesions  03/23/2020 CT C/A/P ([id number]) ([id number]) revealed. "1. Significant positive response to therapy. No new or progressive disease."   PLAN: -Discussed lab results from today, 08/06/2022, with the patient. CBC is stable. CMP showed creatinine of 1.25. -Recommended getting the influenza vaccine, COVID-19 booster, RSV vaccine, and other age-appropriate vaccines. -Patient started Acalabrutinib on July 16, 2022 and denies any toxicities. -Continue Acalabrutinib medication with same dose.    FOLLOW UP: RTC with [name] with labs in 2 months  The total time spent in the appointment was 20 minutes* .  All of the patient's questions were answered with apparent satisfaction. The patient knows to call the clinic with any problems, questions or concerns.   Sullivan Lone MD MS AAHIVMS [facility] Hematology/Oncology Physician [facility]  .*Total Encounter Time as defined by the Centers for Medicare and Medicaid Services includes, in addition to the face-to-face time of a patient visit (documented in the note above) non-face-to-face time: obtaining and reviewing outside history, ordering and reviewing medications, tests or procedures, care coordination (communications with other health care professionals or caregivers) and documentation in the medical record.   I, Cleda Mccreedy, am acting as a Education administrator for Sullivan Lone, MD. .I have reviewed the above documentation for accuracy and completeness, and I agree with the above. Brunetta Genera MD

## 2022-08-07 ENCOUNTER — Other Ambulatory Visit (HOSPITAL_COMMUNITY): Payer: Self-pay

## 2022-08-08 ENCOUNTER — Other Ambulatory Visit (HOSPITAL_COMMUNITY): Payer: Self-pay

## 2022-08-30 ENCOUNTER — Other Ambulatory Visit (HOSPITAL_COMMUNITY): Payer: Self-pay

## 2022-08-31 ENCOUNTER — Other Ambulatory Visit (HOSPITAL_COMMUNITY): Payer: Self-pay

## 2022-09-25 ENCOUNTER — Other Ambulatory Visit (HOSPITAL_COMMUNITY): Payer: Self-pay

## 2022-09-27 ENCOUNTER — Other Ambulatory Visit (HOSPITAL_COMMUNITY): Payer: Self-pay

## 2022-10-01 ENCOUNTER — Encounter: Payer: Self-pay | Admitting: Pharmacist

## 2022-10-02 ENCOUNTER — Telehealth: Payer: Self-pay | Admitting: Family Medicine

## 2022-10-02 DIAGNOSIS — E782 Mixed hyperlipidemia: Secondary | ICD-10-CM

## 2022-10-02 DIAGNOSIS — N1832 Chronic kidney disease, stage 3b: Secondary | ICD-10-CM

## 2022-10-02 DIAGNOSIS — I10 Essential (primary) hypertension: Secondary | ICD-10-CM

## 2022-10-02 NOTE — Telephone Encounter (Signed)
-----   Message from Ellamae Sia sent at 09/26/2022 12:12 PM EDT ----- Regarding: Lab orders for Thursday, 3.28.24 Patient is scheduled for CPX labs, please order future labs, Thanks , Karna Christmas

## 2022-10-04 ENCOUNTER — Other Ambulatory Visit (INDEPENDENT_AMBULATORY_CARE_PROVIDER_SITE_OTHER): Payer: Medicare PPO

## 2022-10-04 DIAGNOSIS — E782 Mixed hyperlipidemia: Secondary | ICD-10-CM

## 2022-10-04 DIAGNOSIS — I10 Essential (primary) hypertension: Secondary | ICD-10-CM

## 2022-10-04 DIAGNOSIS — N1832 Chronic kidney disease, stage 3b: Secondary | ICD-10-CM | POA: Diagnosis not present

## 2022-10-04 LAB — LIPID PANEL
Cholesterol: 263 mg/dL — ABNORMAL HIGH (ref 0–200)
HDL: 55.7 mg/dL (ref >39.00)
LDL Cholesterol: 179 mg/dL — ABNORMAL HIGH (ref 0–99)
NonHDL: 207.47
Total CHOL/HDL Ratio: 5
Triglycerides: 143 mg/dL (ref 0.0–149.0)
VLDL: 28.6 mg/dL (ref 0.0–40.0)

## 2022-10-04 LAB — VITAMIN D 25 HYDROXY (VIT D DEFICIENCY, FRACTURES): VITD: 69.3 ng/mL (ref 30.00–100.00)

## 2022-10-04 LAB — CBC WITH DIFFERENTIAL/PLATELET
Basophils Absolute: 0 10*3/uL (ref 0.0–0.1)
Basophils Relative: 0.6 % (ref 0.0–3.0)
Eosinophils Absolute: 0.1 10*3/uL (ref 0.0–0.7)
Eosinophils Relative: 2.7 % (ref 0.0–5.0)
HCT: 35 % — ABNORMAL LOW (ref 36.0–46.0)
Hemoglobin: 11.2 g/dL — ABNORMAL LOW (ref 12.0–15.0)
Lymphocytes Relative: 61.2 % — ABNORMAL HIGH (ref 12.0–46.0)
Lymphs Abs: 2.6 10*3/uL (ref 0.7–4.0)
MCHC: 32.1 g/dL (ref 30.0–36.0)
MCV: 82.9 fl (ref 78.0–100.0)
Monocytes Absolute: 0.5 10*3/uL (ref 0.1–1.0)
Monocytes Relative: 12.7 % — ABNORMAL HIGH (ref 3.0–12.0)
Neutro Abs: 1 10*3/uL — ABNORMAL LOW (ref 1.4–7.7)
Neutrophils Relative %: 22.8 % — ABNORMAL LOW (ref 43.0–77.0)
Platelets: 235 10*3/uL (ref 150.0–400.0)
RBC: 4.22 Mil/uL (ref 3.87–5.11)
RDW: 14.3 % (ref 11.5–15.5)
WBC: 4.3 10*3/uL (ref 4.0–10.5)

## 2022-10-04 LAB — COMPREHENSIVE METABOLIC PANEL
ALT: 16 U/L (ref 0–35)
AST: 22 U/L (ref 0–37)
Albumin: 4 g/dL (ref 3.5–5.2)
Alkaline Phosphatase: 56 U/L (ref 39–117)
BUN: 19 mg/dL (ref 6–23)
CO2: 26 mEq/L (ref 19–32)
Calcium: 9.4 mg/dL (ref 8.4–10.5)
Chloride: 108 mEq/L (ref 96–112)
Creatinine, Ser: 1.23 mg/dL — ABNORMAL HIGH (ref 0.40–1.20)
GFR: 42.83 mL/min — ABNORMAL LOW (ref >60.00)
Glucose, Bld: 95 mg/dL (ref 70–99)
Potassium: 3.8 mEq/L (ref 3.5–5.1)
Sodium: 141 mEq/L (ref 135–145)
Total Bilirubin: 0.3 mg/dL (ref 0.2–1.2)
Total Protein: 6 g/dL (ref 6.0–8.3)

## 2022-10-04 LAB — TSH: TSH: 2.03 u[IU]/mL (ref 0.35–5.50)

## 2022-10-05 ENCOUNTER — Other Ambulatory Visit: Payer: Medicare PPO

## 2022-10-11 ENCOUNTER — Other Ambulatory Visit: Payer: Self-pay

## 2022-10-11 DIAGNOSIS — C9110 Chronic lymphocytic leukemia of B-cell type not having achieved remission: Secondary | ICD-10-CM

## 2022-10-12 ENCOUNTER — Ambulatory Visit (INDEPENDENT_AMBULATORY_CARE_PROVIDER_SITE_OTHER): Payer: Medicare PPO | Admitting: Family Medicine

## 2022-10-12 ENCOUNTER — Encounter: Payer: Self-pay | Admitting: Family Medicine

## 2022-10-12 VITALS — BP 128/74 | HR 76 | Temp 97.3°F | Ht 61.0 in | Wt 178.1 lb

## 2022-10-12 DIAGNOSIS — C9110 Chronic lymphocytic leukemia of B-cell type not having achieved remission: Secondary | ICD-10-CM

## 2022-10-12 DIAGNOSIS — E2839 Other primary ovarian failure: Secondary | ICD-10-CM

## 2022-10-12 DIAGNOSIS — Z1211 Encounter for screening for malignant neoplasm of colon: Secondary | ICD-10-CM | POA: Diagnosis not present

## 2022-10-12 DIAGNOSIS — Z1231 Encounter for screening mammogram for malignant neoplasm of breast: Secondary | ICD-10-CM

## 2022-10-12 DIAGNOSIS — Z789 Other specified health status: Secondary | ICD-10-CM | POA: Diagnosis not present

## 2022-10-12 DIAGNOSIS — E782 Mixed hyperlipidemia: Secondary | ICD-10-CM | POA: Diagnosis not present

## 2022-10-12 DIAGNOSIS — N1832 Chronic kidney disease, stage 3b: Secondary | ICD-10-CM | POA: Diagnosis not present

## 2022-10-12 DIAGNOSIS — Z0000 Encounter for general adult medical examination without abnormal findings: Secondary | ICD-10-CM

## 2022-10-12 DIAGNOSIS — I10 Essential (primary) hypertension: Secondary | ICD-10-CM

## 2022-10-12 MED ORDER — AMLODIPINE BESYLATE 10 MG PO TABS: ORAL_TABLET | ORAL | 3 refills | Status: DC

## 2022-10-12 MED ORDER — LISINOPRIL 10 MG PO TABS: 5.0000 mg | ORAL_TABLET | Freq: Every day | ORAL | 3 refills | Status: AC

## 2022-10-12 NOTE — Assessment & Plan Note (Signed)
Sees oncology  Not tolerating the acalabrutinib well - body aches Has appt Monday to discuss

## 2022-10-12 NOTE — Assessment & Plan Note (Signed)
Fairly stable GFR of 42.8  Drinking lots of water Avoids nsaids On low dose ace Bp is controlled

## 2022-10-12 NOTE — Assessment & Plan Note (Signed)
Dexa ordered Pt will call to schedule

## 2022-10-12 NOTE — Patient Instructions (Addendum)
Good job on cholesterol so far  Keep up the great diet   Keep taking great care of yourself   Check with your pharmacy and ask about coverage of PCYK9 inhibitors for cholesterol (shots)  Tell them you cannot take statin medicines   Call and schedule your mammogram and bone density test  You have an order for:  []   2D Mammogram  [x]   3D Mammogram  [x]   Bone Density     Please call for appointment:   []   [facility]  [address] [place] [address]  [phone]  []   [facility])   [address]  [place], [place] [address]  [phone]  [x]   [facility]      [address] [place], [place]        [phone]         []   [facility]  [address] [place], [place]  [phone]  []  [facility]   [address]   [place], [place] [address]  [phone]  []  [facility]  [address] [place], [place] [address] [phone]    Make sure to wear two piece clothing  No lotions powders or deodorants the day of the appointment Make sure to bring picture ID and insurance card.  Bring list of medications you are currently taking including any supplements.   Schedule your screening mammogram through MyChart!   Select [facility] imaging sites can now be scheduled through MyChart.  Log into your MyChart account.  Go to 'Visit' (or 'Appointments' if  on mobile App) --> Schedule an  Appointment  Under 'Select a Reason for Visit' choose the Mammogram  Screening option.  Complete the pre-visit questions  and select the time and place that  best fits your schedule

## 2022-10-12 NOTE — Assessment & Plan Note (Signed)
Colonoscopy utd 2019 

## 2022-10-12 NOTE — Assessment & Plan Note (Signed)
Disc goals for lipids and reasons to control them Rev last labs with pt Rev low sat fat diet in detail  LDL is down to 179 with better diet (from 232) Intol of statins Has seen cardiology  Could not afford pcyk9 in past- enc her to call ins and see if covered now  May be good candidate

## 2022-10-12 NOTE — Progress Notes (Unsigned)
Subjective:    Patient ID: Erica Carlson, female    DOB: Jun 14, 1946, 77 y.o.   MRN: [id number]  HPI Here for health maintenance exam and to review chronic medical problems    Wt Readings from Last 3 Encounters:  10/12/22 178 lb 2 oz (80.8 kg)  08/06/22 181 lb 12.8 oz (82.5 kg)  05/09/22 177 lb 14.4 oz (80.7 kg)   33.66 kg/m  Vitals:   10/12/22 0925  BP: 128/74  Pulse: 76  Temp: (!) 97.3 F (36.3 C)  SpO2: 98%   Doing ok  Has been busy - likes to work on the yard/garden    Immunization History  Administered Date(s) Administered   Fluad Quad(high Dose 65+) 05/18/2020, 06/09/2021, 06/13/2022   Influenza Split 04/23/2012   Influenza,inj,Quad PF,6+ Mos 04/29/2013, 07/04/2016, 07/08/2017, 04/21/2019   PFIZER(Purple Top)SARS-COV-2 Vaccination 08/30/2019, 09/23/2019, 03/24/2020   Pneumococcal Conjugate-13 07/04/2016   Pneumococcal Polysaccharide-23 12/15/2010, 07/08/2017   Td 03/03/2009   Tdap 08/25/2014   Health Maintenance Due  Topic Date Due   DEXA SCAN  Never done   MAMMOGRAM  10/10/2018   Medicare Annual Wellness (AWV)  10/11/2022   Mammogram 2019-never got the one we ordered last year  Self breast exam: no lumps  Dexa -needs  Falls-none Fractures-none  Supplements -taking vitamin D  Last vitamin D Lab Results  Component Value Date   VD25OH 69.30 10/04/2022    Exercise : goes to the gym regularly / has trainer Some weight lifting    Colonoscopy 04/2018   HTN bp is stable today  No cp or palpitations or headaches or edema  No side effects to medicines  BP Readings from Last 3 Encounters:  10/12/22 128/74  08/06/22 (!) 153/75  05/09/22 (!) 159/89    Lisinopril 5 mg daily  Amlodiine 10 mg daily   Eating healthy  Lost a little wt Eating more vegetables  Some fruit smoothies in the am with protein   Mood    10/12/2022    9:24 AM 10/10/2021    3:02 PM 09/27/2020    2:50 PM 07/22/2019    9:41 AM 07/14/2018   10:17 AM  Depression screen PHQ  2/9  Decreased Interest 0 0 0 0 0  Down, Depressed, Hopeless 0 0 0 0 0  PHQ - 2 Score 0 0 0 0 0  Altered sleeping   0    Tired, decreased energy   0    Change in appetite   0    Feeling bad or failure about yourself    0    Trouble concentrating   0    Moving slowly or fidgety/restless   0    Suicidal thoughts   0    PHQ-9 Score   0    Difficult doing work/chores   Not difficult at all        CKD Last metabolic panel Lab Results  Component Value Date   GLUCOSE 95 10/04/2022   NA 141 10/04/2022   K 3.8 10/04/2022   CL 108 10/04/2022   CO2 26 10/04/2022   BUN 19 10/04/2022   CREATININE 1.23 (H) 10/04/2022   GFRNONAA 45 (L) 08/06/2022   CALCIUM 9.4 10/04/2022   PHOS 4.4 12/03/2019   PROT 6.0 10/04/2022   ALBUMIN 4.0 10/04/2022   LABGLOB 3.1 02/29/2016   AGRATIO 1.2 02/29/2016   BILITOT 0.3 10/04/2022   ALKPHOS 56 10/04/2022   AST 22 10/04/2022   ALT 16 10/04/2022   ANIONGAP 5 08/06/2022  GFR 42.8  Is good at drinking water  70-90 oz per day  No nsaids    H/o CLL Taking acalabrutinib - tolerated for a while but now causing body aches Has appt with onc to discuss Monday   Lab Results  Component Value Date   WBC 4.3 10/04/2022   HGB 11.2 (L) 10/04/2022   HCT 35.0 (L) 10/04/2022   MCV 82.9 10/04/2022   PLT 235.0 10/04/2022    Hyperlipidemia Lab Results  Component Value Date   CHOL 263 (H) 10/04/2022   CHOL 319 (H) 10/02/2021   CHOL 373 (H) 09/27/2020   Lab Results  Component Value Date   HDL 55.70 10/04/2022   HDL 57.40 10/02/2021   HDL 58.70 09/27/2020   Lab Results  Component Value Date   LDLCALC 179 (H) 10/04/2022   LDLCALC 232 (H) 10/02/2021   LDLCALC 147 (H) 07/15/2019   Lab Results  Component Value Date   TRIG 143.0 10/04/2022   TRIG 148.0 10/02/2021   TRIG 261.0 (H) 09/27/2020   Lab Results  Component Value Date   CHOLHDL 5 10/04/2022   CHOLHDL 6 10/02/2021   CHOLHDL 6 09/27/2020   Lab Results  Component Value Date    LDLDIRECT 204.0 09/27/2020   LDLDIRECT 204.9 02/23/2014   LDLDIRECT 249.3 04/23/2012   In past did not tolerate statins  Could not afford pcyk9 drug  LDL down significantly  Eating better  Trig are good  The 10-year ASCVD risk score (Arnett DK, et al., 2019) is: 20.3%   Values used to calculate the score:     Age: [age] years     Sex: Female     Is Non-Hispanic African American: Yes     Diabetic: No     Tobacco smoker: No     Systolic Blood Pressure: 128 mmHg     Is BP treated: Yes     HDL Cholesterol: 55.7 mg/dL     Total Cholesterol: 263 mg/dL   No processed food at all  No canned foods  Beef -not often   Patient Active Problem List   Diagnosis Date Noted   Statin intolerance 10/12/2022   CKD (chronic kidney disease) stage 3, GFR 30-59 ml/min 11/23/2021   Encounter for screening mammogram for breast cancer 10/10/2021   Elevated serum creatinine 10/10/2021   Hand pain 10/04/2020   CLL (chronic lymphocytic leukemia) 08/31/2019   Counseling regarding advance care planning and goals of care 08/31/2019   Need for hepatitis C screening test 06/26/2016   Lymphoma, 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   Encounter for Medicare annual wellness exam 09/30/2013   Estrogen deficiency 09/17/2012   Herpes zoster without complication 07/09/2011   Special screening for malignant neoplasms, colon 12/15/2010   Routine general medical examination at a health care facility 12/08/2010   OSTEOARTHRITIS, KNEE, RIGHT 10/11/2008   Internal hemorrhoids 09/02/2008   Hypertension 04/28/2008   PNEUMONIA, HX OF 03/03/2008   Hyperlipidemia 02/26/2008   Past Medical History:  Diagnosis Date   Allergy    spring   Arthritis    osteo arthritis of right knee   Cataract    forming   Hyperlipidemia    Hypertension    Lymphoma, small-cell    right axillary    Pneumonia    Past Surgical History:  Procedure Laterality Date   ABDOMINAL HYSTERECTOMY     AXILLARY LYMPH NODE BIOPSY Right 02/07/2016   back  injections     BREAST SURGERY     COLONOSCOPY  2009  EYE SURGERY     retinal tear repair   KNEE SURGERY     torn [facility] per pt   Social History   Tobacco Use   Smoking status: Never   Smokeless tobacco: Never  Vaping Use   Vaping Use: Never used  Substance Use Topics   Alcohol use: No    Alcohol/week: 0.0 standard drinks of alcohol   Drug use: No   Family History  Problem Relation Age of Onset   Breast cancer Sister    Hyperlipidemia Son    Schizophrenia Mother    Colon cancer Neg Hx    Colon polyps Neg Hx    Esophageal cancer Neg Hx    Rectal cancer Neg Hx    Stomach cancer Neg Hx    Allergies  Allergen Reactions   Crestor [Rosuvastatin] Nausea Only    Muscle pain also   Hctz [Hydrochlorothiazide] Nausea And Vomiting    Nausea and vomiting    Lipitor [Atorvastatin] Other (See Comments)    Ache in back and legs   Current Outpatient Medications on File Prior to Visit  Medication Sig Dispense Refill   acalabrutinib maleate (CALQUENCE) 100 MG tablet Take 1 tablet (100 mg total) by mouth 2 (two) times daily. 60 tablet 5   Cholecalciferol (VITAMIN D-3) 5000 units TABS Take 1 tablet by mouth daily.     diphenhydrAMINE (BENADRYL) 25 mg capsule Take 25 mg by mouth every 6 (six) hours as needed for itching or allergies.     polyethylene glycol (MIRALAX / GLYCOLAX) 17 g packet Take 17 g by mouth daily as needed.     No current facility-administered medications on file prior to visit.    Review of Systems  Constitutional:  Positive for fatigue. Negative for activity change, appetite change, fever and unexpected weight change.  HENT:  Negative for congestion, ear pain, rhinorrhea, sinus pressure and sore throat.   Eyes:  Negative for pain, redness and visual disturbance.  Respiratory:  Negative for cough, shortness of breath and wheezing.   Cardiovascular:  Negative for chest pain and palpitations.  Gastrointestinal:  Negative for abdominal pain, blood in stool, constipation  and diarrhea.  Endocrine: Negative for polydipsia and polyuria.  Genitourinary:  Negative for dysuria, frequency and urgency.  Musculoskeletal:  Positive for arthralgias and myalgias. Negative for back pain.  Skin:  Negative for pallor and rash.  Allergic/Immunologic: Negative for environmental allergies.  Neurological:  Negative for dizziness, syncope and headaches.  Hematological:  Negative for adenopathy. Does not bruise/bleed easily.  Psychiatric/Behavioral:  Negative for decreased concentration and dysphoric mood. The patient is not nervous/anxious.        Objective:   Physical Exam Constitutional:      General: She is not in acute distress.    Appearance: Normal appearance. She is well-developed. She is obese. She is not ill-appearing or diaphoretic.  HENT:     Head: Normocephalic and atraumatic.     Right Ear: Tympanic membrane, ear canal and external ear normal.     Left Ear: Tympanic membrane, ear canal and external ear normal.     Nose: Nose normal. No congestion.     Mouth/Throat:     Mouth: Mucous membranes are moist.     Pharynx: Oropharynx is clear. No posterior oropharyngeal erythema.  Eyes:     General: No scleral icterus.    Extraocular Movements: Extraocular movements intact.     Conjunctiva/sclera: Conjunctivae normal.     Pupils: Pupils are equal, round, and reactive to  light.  Neck:     Thyroid: No thyromegaly.     Vascular: No carotid bruit or JVD.  Cardiovascular:     Rate and Rhythm: Normal rate and regular rhythm.     Pulses: Normal pulses.     Heart sounds: Normal heart sounds.     No gallop.  Pulmonary:     Effort: Pulmonary effort is normal. No respiratory distress.     Breath sounds: Normal breath sounds. No wheezing.     Comments: Good air exch Chest:     Chest wall: No tenderness.  Abdominal:     General: Bowel sounds are normal. There is no distension or abdominal bruit.     Palpations: Abdomen is soft. There is no mass.     Tenderness:  There is no abdominal tenderness.     Hernia: No hernia is present.  Genitourinary:    Comments: Breast exam: No mass, nodules, thickening, tenderness, bulging, retraction, inflamation, nipple discharge or skin changes noted.  No axillary or clavicular LA.     Musculoskeletal:        General: No tenderness. Normal range of motion.     Cervical back: Normal range of motion and neck supple. No rigidity. No muscular tenderness.     Right lower leg: No edema.     Left lower leg: No edema.     Comments: No kyphosis   Lymphadenopathy:     Cervical: No cervical adenopathy.  Skin:    General: Skin is warm and dry.     Coloration: Skin is not pale.     Findings: No erythema or rash.     Comments: Some skin tags  Neurological:     Mental Status: She is alert. Mental status is at baseline.     Cranial Nerves: No cranial nerve deficit.     Motor: No abnormal muscle tone.     Coordination: Coordination normal.     Gait: Gait normal.     Deep Tendon Reflexes: Reflexes are normal and symmetric. Reflexes normal.  Psychiatric:        Mood and Affect: Mood normal.        Cognition and Memory: Cognition and memory normal.           Assessment & Plan:   Problem List Items Addressed This Visit       Cardiovascular and Mediastinum   Hypertension    bp in fair control at this time  BP Readings from Last 1 Encounters:  10/12/22 128/74  No changes needed Most recent labs reviewed  Disc lifstyle change with low sodium diet and exercise    Lisinopril 5 mg daily   Amlodipine 10 mg daily  Good habits !      Relevant Medications   amLODipine (NORVASC) 10 MG tablet   lisinopril (ZESTRIL) 10 MG tablet     Genitourinary   CKD (chronic kidney disease) stage 3, GFR 30-59 ml/min    Fairly stable GFR of 42.8  Drinking lots of water Avoids nsaids On low dose ace Bp is controlled         Other   CLL (chronic lymphocytic leukemia)    Sees oncology  Not tolerating the acalabrutinib  well - body aches Has appt Monday to discuss       Encounter for screening mammogram for breast cancer    Mammogram ordered Pt will call to schedule      Relevant Orders   MM 3D SCREENING MAMMOGRAM BILATERAL BREAST   Estrogen  deficiency    Dexa ordered Pt will call to schedule       Relevant Orders   DG Bone Density   Hyperlipidemia    Disc goals for lipids and reasons to control them Rev last labs with pt Rev low sat fat diet in detail  LDL is down to 179 with better diet (from 232) Intol of statins Has seen cardiology  Could not afford pcyk9 in past- enc her to call ins and see if covered now  May be good candidate      Relevant Medications   amLODipine (NORVASC) 10 MG tablet   lisinopril (ZESTRIL) 10 MG tablet   Routine general medical examination at a health care facility - Primary    Reviewed health habits including diet and exercise and skin cancer prevention Reviewed appropriate screening tests for age  Also reviewed health mt list, fam hx and immunization status , as well as social and family history   See HPI Labs reviewed and ordered Mammogram and dexa ordered [place] health habits Taking vit D and level is tx  Colonoscopy utd 2019  PHQ 0  Under onc care for CLL      Special screening for malignant neoplasms, colon    Colonoscopy utd 2019       Statin intolerance    ? If pcyk9 drug would be covered? ? If trial of zetia  Some imp in chol with diet

## 2022-10-12 NOTE — Assessment & Plan Note (Signed)
Mammogram ordered °Pt will call to schedule  °

## 2022-10-12 NOTE — Assessment & Plan Note (Signed)
?   If pcyk9 drug would be covered? ? If trial of zetia  Some imp in chol with diet

## 2022-10-12 NOTE — Assessment & Plan Note (Signed)
Reviewed health habits including diet and exercise and skin cancer prevention Reviewed appropriate screening tests for age  Also reviewed health mt list, fam hx and immunization status , as well as social and family history   See HPI Labs reviewed and ordered Mammogram and dexa ordered [place] health habits Taking vit D and level is tx  Colonoscopy utd 2019  PHQ 0  Under onc care for CLL

## 2022-10-12 NOTE — Assessment & Plan Note (Signed)
bp in fair control at this time  BP Readings from Last 1 Encounters:  10/12/22 128/74   No changes needed Most recent labs reviewed  Disc lifstyle change with low sodium diet and exercise    Lisinopril 5 mg daily   Amlodipine 10 mg daily  Good habits !

## 2022-10-14 DIAGNOSIS — L89302 Pressure ulcer of unspecified buttock, stage 2: Secondary | ICD-10-CM | POA: Insufficient documentation

## 2022-10-15 ENCOUNTER — Inpatient Hospital Stay: Payer: Medicare PPO | Attending: Hematology

## 2022-10-15 ENCOUNTER — Inpatient Hospital Stay: Payer: Medicare PPO | Admitting: Hematology

## 2022-10-15 VITALS — BP 138/75 | HR 70 | Temp 97.5°F | Resp 18 | Wt 182.5 lb

## 2022-10-15 DIAGNOSIS — C9110 Chronic lymphocytic leukemia of B-cell type not having achieved remission: Secondary | ICD-10-CM | POA: Insufficient documentation

## 2022-10-15 LAB — CMP (CANCER CENTER ONLY)
ALT: 16 U/L (ref 0–44)
AST: 13 U/L — ABNORMAL LOW (ref 15–41)
Albumin: 4.4 g/dL (ref 3.5–5.0)
Alkaline Phosphatase: 57 U/L (ref 38–126)
Anion gap: 6 (ref 5–15)
BUN: 24 mg/dL — ABNORMAL HIGH (ref 8–23)
CO2: 27 mmol/L (ref 22–32)
Calcium: 10 mg/dL (ref 8.9–10.3)
Chloride: 105 mmol/L (ref 98–111)
Creatinine: 1.27 mg/dL — ABNORMAL HIGH (ref 0.44–1.00)
GFR, Estimated: 44 mL/min — ABNORMAL LOW (ref >60)
Glucose, Bld: 93 mg/dL (ref 70–99)
Potassium: 3.9 mmol/L (ref 3.5–5.1)
Sodium: 138 mmol/L (ref 135–145)
Total Bilirubin: 0.4 mg/dL (ref 0.3–1.2)
Total Protein: 7 g/dL (ref 6.5–8.1)

## 2022-10-15 LAB — CBC WITH DIFFERENTIAL (CANCER CENTER ONLY)
Abs Immature Granulocytes: 0.05 10*3/uL (ref 0.00–0.07)
Basophils Absolute: 0 10*3/uL (ref 0.0–0.1)
Basophils Relative: 0 %
Eosinophils Absolute: 0.2 10*3/uL (ref 0.0–0.5)
Eosinophils Relative: 3 %
HCT: 34.5 % — ABNORMAL LOW (ref 36.0–46.0)
Hemoglobin: 10.9 g/dL — ABNORMAL LOW (ref 12.0–15.0)
Immature Granulocytes: 1 %
Lymphocytes Relative: 47 %
Lymphs Abs: 3 10*3/uL (ref 0.7–4.0)
MCH: 26.8 pg (ref 26.0–34.0)
MCHC: 31.6 g/dL (ref 30.0–36.0)
MCV: 85 fL (ref 80.0–100.0)
Monocytes Absolute: 0.7 10*3/uL (ref 0.1–1.0)
Monocytes Relative: 10 %
Neutro Abs: 2.4 10*3/uL (ref 1.7–7.7)
Neutrophils Relative %: 39 %
Platelet Count: 229 10*3/uL (ref 150–400)
RBC: 4.06 MIL/uL (ref 3.87–5.11)
RDW: 14.6 % (ref 11.5–15.5)
WBC Count: 6.3 10*3/uL (ref 4.0–10.5)
nRBC: 0 % (ref 0.0–0.2)

## 2022-10-15 LAB — LACTATE DEHYDROGENASE: LDH: 182 U/L (ref 98–192)

## 2022-10-15 NOTE — Progress Notes (Signed)
HEMATOLOGY/ONCOLOGY CLINIC NOTE  Date of Service: 10/15/22  Patient Care Team: Tower, Audrie Gallus, MD as PCP - General (Family Medicine) Liberty Handy, OD as Consulting Physician (Optometry) Candise Che, Corene Cornea, MD as Consulting Physician (Hematology)  CHIEF COMPLAINTS/PURPOSE OF CONSULTATION:  Follow-up for continued evaluation and management of CLL  HISTORY OF PRESENTING ILLNESS:  Please see previous notes for details on initial presentation.  INTERVAL HISTORY:   Erica Carlson is a 77 y.o. 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  Patient's last visit with me was on 08/06/2022 and she was doing well overall. She tolerated her Acalabrutinib well without any severe toxicities, except mild headache.   Patient reports that she has not been doing well overall since our last visit. She complains of bilateral leg pain and aches mostly near her knees, which started around 1-2 weeks ago. She also complains of fatigue and mild headaches since our last visit. She notes that her bilateral leg aches and fatigue have been causing her to walk less than usual.   Patient notes she has been tolerating her Acalabrutinib well without any new or severe toxicities.   She denies fever, chills, night sweats, unexpected weight loss, abdominal pain, back pain, chest pain, abnormal bowel movement, or leg swelling.   Patient reports she tries to drink at least 68 ounces of water everyday.    MEDICAL HISTORY:  Past Medical History:  Diagnosis Date   Allergy    spring   Arthritis    osteo arthritis of right knee   Cataract    forming   Hyperlipidemia    Hypertension    Lymphoma, small-cell    right axillary    Pneumonia     SURGICAL HISTORY: Past Surgical History:  Procedure Laterality Date   ABDOMINAL HYSTERECTOMY     AXILLARY LYMPH NODE BIOPSY Right 02/07/2016   back injections     BREAST SURGERY     COLONOSCOPY  2009   EYE  SURGERY     retinal tear repair   KNEE SURGERY     torn [facility] per pt    SOCIAL HISTORY: Social History   Socioeconomic History   Marital status: Widowed    Spouse name: n/a   Number of children: 6   Years of education: 14   Highest education level: Not on file  Occupational History   Occupation: self-employed    Employer: [employer]    Comment: alterations and design  Tobacco Use   Smoking status: Never   Smokeless tobacco: Never  Vaping Use   Vaping Use: Never used  Substance and Sexual Activity   Alcohol use: No    Alcohol/week: 0.0 standard drinks of alcohol   Drug use: No   Sexual activity: Never  Other Topics Concern   Not on file  Social History Narrative   Lives alone.   Her adult children live nearby.   Social Determinants of Health   Financial Resource Strain: Low Risk  (09/27/2020)   Overall Financial Resource Strain (CARDIA)    Difficulty of Paying Living Expenses: Not hard at all  Food Insecurity: No Food Insecurity (09/27/2020)   Hunger Vital Sign    Worried About Running Out of Food in the Last Year: Never true    Ran Out of Food in the Last Year: Never true  Transportation Needs: No Transportation Needs (09/27/2020)   PRAPARE - Administrator, Civil Service (Medical): No  Lack of Transportation (Non-Medical): No  Physical Activity: Inactive (09/27/2020)   Exercise Vital Sign    Days of Exercise per Week: 0 days    Minutes of Exercise per Session: 0 min  Stress: No Stress Concern Present (09/27/2020)   [employer] - Occupational Stress Questionnaire    Feeling of Stress : Not at all  Social Connections: Not on file  Intimate Partner Violence: Not At Risk (09/27/2020)   Humiliation, Afraid, Rape, and Kick questionnaire    Fear of Current or Ex-Partner: No    Emotionally Abused: No    Physically Abused: No    Sexually Abused: No    FAMILY HISTORY: Family History  Problem Relation Age of Onset    Breast cancer Sister    Hyperlipidemia Son    Schizophrenia Mother    Colon cancer Neg Hx    Colon polyps Neg Hx    Esophageal cancer Neg Hx    Rectal cancer Neg Hx    Stomach cancer Neg Hx     ALLERGIES:  is allergic to crestor [rosuvastatin], hctz [hydrochlorothiazide], and lipitor [atorvastatin].  MEDICATIONS:  Current Outpatient Medications  Medication Sig Dispense Refill   acalabrutinib maleate (CALQUENCE) 100 MG tablet Take 1 tablet (100 mg total) by mouth 2 (two) times daily. 60 tablet 5   amLODipine (NORVASC) 10 MG tablet TAKE 1 TABLET(10 MG) BY MOUTH DAILY 90 tablet 3   Cholecalciferol (VITAMIN D-3) 5000 units TABS Take 1 tablet by mouth daily.     diphenhydrAMINE (BENADRYL) 25 mg capsule Take 25 mg by mouth every 6 (six) hours as needed for itching or allergies.     lisinopril (ZESTRIL) 10 MG tablet Take 0.5 tablets (5 mg total) by mouth daily. 45 tablet 3   polyethylene glycol (MIRALAX / GLYCOLAX) 17 g packet Take 17 g by mouth daily as needed.     No current facility-administered medications for this visit.    REVIEW OF SYSTEMS:   10 Point review of Systems was done is negative except as noted above.   PHYSICAL EXAMINATION: .BP 138/75   Pulse 70   Temp (!) 97.5 F (36.4 C)   Resp 18   Wt 182 lb 8 oz (82.8 kg)   SpO2 100%   BMI 34.48 kg/m  . GENERAL:alert, in no acute distress and comfortable SKIN: no acute rashes, no significant lesions EYES: conjunctiva are pink and non-injected, sclera anicteric OROPHARYNX: MMM, no exudates, no oropharyngeal erythema or ulceration NECK: supple, no JVD LYMPH:  no palpable lymphadenopathy in the cervical, axillary or inguinal regions LUNGS: clear to auscultation b/l with normal respiratory effort HEART: regular rate & rhythm ABDOMEN:  normoactive bowel sounds , non tender, not distended. Extremity: no pedal edema PSYCH: alert & oriented x 3 with fluent speech NEURO: no focal motor/sensory deficits   LABORATORY DATA:   I have reviewed the data as listed  .    Latest Ref Rng & Units 10/15/2022    9:11 AM 10/04/2022    8:11 AM 08/06/2022   11:58 AM  CBC  WBC 4.0 - 10.5 K/uL 6.3  4.3  7.8   Hemoglobin 12.0 - 15.0 g/dL 16.110.9  09.611.2  04.511.8   Hematocrit 36.0 - 46.0 % 34.5  35.0  37.9   Platelets 150 - 400 K/uL 229  235.0  276     .    Latest Ref Rng & Units 10/15/2022    9:11 AM 10/04/2022    8:11 AM 08/06/2022  11:58 AM  CMP  Glucose 70 - 99 mg/dL 93  95  92   BUN 8 - 23 mg/dL 24  19  22    Creatinine 0.44 - 1.00 mg/dL 4.091.27  8.111.23  9.141.25   Sodium 135 - 145 mmol/L 138  141  139   Potassium 3.5 - 5.1 mmol/L 3.9  3.8  4.1   Chloride 98 - 111 mmol/L 105  108  107   CO2 22 - 32 mmol/L 27  26  27    Calcium 8.9 - 10.3 mg/dL 78.210.0  9.4  9.6   Total Protein 6.5 - 8.1 g/dL 7.0  6.0  6.9   Total Bilirubin 0.3 - 1.2 mg/dL 0.4  0.3  0.3   Alkaline Phos 38 - 126 U/L 57  56  71   AST 15 - 41 U/L 13  22  11    ALT 0 - 44 U/L 16  16  9     . Lab Results  Component Value Date   LDH 158 08/06/2022     09/22/18 Right Axillary LN Biopsy:    07/31/18 Molecular Pathology:     02/21/16 Biopsy:    RADIOGRAPHIC STUDIES: I have personally reviewed the radiological images as listed and agreed with the findings in the report. No results found.   ASSESSMENT & PLAN:   77 y.o. female with  1. Chronic Lymphocytic Leukemia  02/21/16 Right Axillary LN biopsy revealed SLL, the initial diagnosis. 07/31/18 FISH CLL Prognostic Panel revealed:52.0% cells with 11q deletion Labs upon initial presentation from 09/22/18, WBC at 16.8k, HGB at 10.9, PLT at 272k. ANC at 1.2k, Lymphs at 11.3k, Monocytes at 4.1k. 09/22/18 Right Axillary LN Biopsy revealed SLL, ruling out a transformation event  08/15/18 PET/CT revealed Bulky lymphadenopathy in the neck, chest, abdomen, and pelvis demonstrating low level hypermetabolism throughout. 9 mm right thyroid nodule is hypermetabolic. Thyroid ultrasound recommended to further evaluate. Mucosal  thickening with air-fluid level in the left maxillary sinus. 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  08/12/2019 PET/CT scan ([id number]) revealed disease progression in neck, chest, abdomen, pelvis and interval development of pulmonary lesions  03/23/2020 CT C/A/P ([id number]) ([id number]) revealed. "1. Significant positive response to therapy. No new or progressive disease."   PLAN: -Discussed lab results from today, 10/15/2022, with the patient. CBC shows slightly decreased Hemoglobin of 10.9 and slightly decreased hematocrit of 34.5. CMP shows slightly elevated creatinine at 1.27 and decreased AST at 13.  -Patient has been tolerating Acalavrutinib well without any new or severe toxicities.  -Continue Acalabrutinib medication with same dose.  -Recommended to stay physically active and stay well hydrated.   FOLLOW UP: RTC with Dr Candise CheKale with labs in 3 months  The total time spent in the appointment was 20 minutes* .  All of the patient's questions were answered with apparent satisfaction. The patient knows to call the clinic with any problems, questions or concerns.   Wyvonnia LoraGautam Kyshawn Teal MD MS AAHIVMS [facility] Hematology/Oncology Physician [facility]  .*Total Encounter Time as defined by the Centers for Medicare and Medicaid Services includes, in addition to the face-to-face time of a patient visit (documented in the note above) non-face-to-face time: obtaining and reviewing outside history, ordering and reviewing medications, tests or procedures, care coordination (communications with other health care professionals or caregivers) and documentation in the medical record.   I, Ok EdwardsParam Shah, am acting as a Neurosurgeonscribe for Wyvonnia LoraGautam Jeyli Zwicker, MD. .I have reviewed the above documentation for accuracy and completeness, and I agree  with the above. Brunetta Genera MD

## 2022-10-16 ENCOUNTER — Telehealth: Payer: Self-pay | Admitting: Hematology

## 2022-10-16 NOTE — Telephone Encounter (Signed)
Called patient per 4/8 los notes to schedule f/u. Patient scheduled and notified.  

## 2022-10-25 ENCOUNTER — Other Ambulatory Visit (HOSPITAL_COMMUNITY): Payer: Self-pay

## 2022-10-29 ENCOUNTER — Other Ambulatory Visit: Payer: Self-pay

## 2022-11-16 DIAGNOSIS — M9903 Segmental and somatic dysfunction of lumbar region: Secondary | ICD-10-CM | POA: Diagnosis not present

## 2022-11-16 DIAGNOSIS — M9905 Segmental and somatic dysfunction of pelvic region: Secondary | ICD-10-CM | POA: Diagnosis not present

## 2022-11-16 DIAGNOSIS — M5137 Other intervertebral disc degeneration, lumbosacral region: Secondary | ICD-10-CM | POA: Diagnosis not present

## 2022-11-16 DIAGNOSIS — M9901 Segmental and somatic dysfunction of cervical region: Secondary | ICD-10-CM | POA: Diagnosis not present

## 2022-11-16 DIAGNOSIS — M47814 Spondylosis without myelopathy or radiculopathy, thoracic region: Secondary | ICD-10-CM | POA: Diagnosis not present

## 2022-11-16 DIAGNOSIS — S138XXA Sprain of joints and ligaments of other parts of neck, initial encounter: Secondary | ICD-10-CM | POA: Diagnosis not present

## 2022-11-16 DIAGNOSIS — M9902 Segmental and somatic dysfunction of thoracic region: Secondary | ICD-10-CM | POA: Diagnosis not present

## 2022-11-16 DIAGNOSIS — M5136 Other intervertebral disc degeneration, lumbar region: Secondary | ICD-10-CM | POA: Diagnosis not present

## 2022-11-22 ENCOUNTER — Other Ambulatory Visit (HOSPITAL_COMMUNITY): Payer: Self-pay

## 2022-11-29 ENCOUNTER — Other Ambulatory Visit (HOSPITAL_COMMUNITY): Payer: Self-pay

## 2022-12-05 ENCOUNTER — Other Ambulatory Visit (HOSPITAL_COMMUNITY): Payer: Self-pay

## 2022-12-05 NOTE — Progress Notes (Unsigned)
    Ahren Pettinger T. Kline Bulthuis, MD, CAQ Sports Medicine [facility] at [facility] [address] [place], [address]  Phone: [phone]  FAX: [phone]  Erica Carlson - 77 y.o. female  MRN [id number]  Date of Birth: 1946/06/04  Date: 12/06/2022  PCP: Judy Pimple, MD  Referral: Judy Pimple, MD  No chief complaint on file.  Subjective:   Erica Carlson is a 77 y.o. very pleasant female patient with There is no height or weight on file to calculate BMI. who presents with the following:  Patient presents with ongoing finger pains.    Review of Systems is noted in the HPI, as appropriate  Objective:   There were no vitals taken for this visit.  GEN: No acute distress; alert,appropriate. PULM: Breathing comfortably in no respiratory distress PSYCH: Normally interactive.   Laboratory and Imaging Data:  Assessment and Plan:   ***

## 2022-12-06 ENCOUNTER — Encounter: Payer: Self-pay | Admitting: Family Medicine

## 2022-12-06 ENCOUNTER — Ambulatory Visit: Payer: Medicare PPO | Admitting: Family Medicine

## 2022-12-06 ENCOUNTER — Ambulatory Visit (INDEPENDENT_AMBULATORY_CARE_PROVIDER_SITE_OTHER)
Admission: RE | Admit: 2022-12-06 | Discharge: 2022-12-06 | Disposition: A | Discharge status: Home / Self Care | Payer: Medicare PPO | Source: Ambulatory Visit | Attending: Family Medicine | Admitting: Family Medicine

## 2022-12-06 VITALS — BP 150/74 | HR 73 | Temp 97.7°F | Ht 61.0 in | Wt 181.0 lb

## 2022-12-06 DIAGNOSIS — M79641 Pain in right hand: Secondary | ICD-10-CM

## 2022-12-06 DIAGNOSIS — M19041 Primary osteoarthritis, right hand: Secondary | ICD-10-CM | POA: Diagnosis not present

## 2022-12-06 DIAGNOSIS — M20011 Mallet finger of right finger(s): Secondary | ICD-10-CM | POA: Diagnosis not present

## 2022-12-14 DIAGNOSIS — S138XXA Sprain of joints and ligaments of other parts of neck, initial encounter: Secondary | ICD-10-CM | POA: Diagnosis not present

## 2022-12-14 DIAGNOSIS — M9901 Segmental and somatic dysfunction of cervical region: Secondary | ICD-10-CM | POA: Diagnosis not present

## 2022-12-14 DIAGNOSIS — M9905 Segmental and somatic dysfunction of pelvic region: Secondary | ICD-10-CM | POA: Diagnosis not present

## 2022-12-14 DIAGNOSIS — M5137 Other intervertebral disc degeneration, lumbosacral region: Secondary | ICD-10-CM | POA: Diagnosis not present

## 2022-12-14 DIAGNOSIS — M5136 Other intervertebral disc degeneration, lumbar region: Secondary | ICD-10-CM | POA: Diagnosis not present

## 2022-12-14 DIAGNOSIS — M9902 Segmental and somatic dysfunction of thoracic region: Secondary | ICD-10-CM | POA: Diagnosis not present

## 2022-12-14 DIAGNOSIS — M9903 Segmental and somatic dysfunction of lumbar region: Secondary | ICD-10-CM | POA: Diagnosis not present

## 2022-12-14 DIAGNOSIS — M47814 Spondylosis without myelopathy or radiculopathy, thoracic region: Secondary | ICD-10-CM | POA: Diagnosis not present

## 2022-12-20 ENCOUNTER — Other Ambulatory Visit: Payer: Self-pay | Admitting: Family Medicine

## 2022-12-26 ENCOUNTER — Other Ambulatory Visit (HOSPITAL_COMMUNITY): Payer: Self-pay

## 2023-01-02 ENCOUNTER — Other Ambulatory Visit (HOSPITAL_COMMUNITY): Payer: Self-pay

## 2023-01-04 ENCOUNTER — Other Ambulatory Visit (HOSPITAL_COMMUNITY): Payer: Self-pay

## 2023-01-07 ENCOUNTER — Other Ambulatory Visit: Payer: Self-pay

## 2023-01-08 ENCOUNTER — Ambulatory Visit (INDEPENDENT_AMBULATORY_CARE_PROVIDER_SITE_OTHER): Payer: Medicare PPO

## 2023-01-08 VITALS — Ht 61.0 in | Wt 181.0 lb

## 2023-01-08 DIAGNOSIS — Z0000 Encounter for general adult medical examination without abnormal findings: Secondary | ICD-10-CM | POA: Diagnosis not present

## 2023-01-08 NOTE — Progress Notes (Signed)
Subjective:   Erica Carlson is a 77 y.o. female who presents for Medicare Annual (Subsequent) preventive examination.  Visit Complete: Virtual  I connected with  Erica Carlson on 01/08/23 by a audio enabled telemedicine application and verified that I am speaking with the correct person using two identifiers.  Patient Location: Home  Provider Location: Home Office  I discussed the limitations of evaluation and management by telemedicine. The patient expressed understanding and agreed to proceed.  Review of Systems     Cardiac Risk Factors include: advanced age (>59men, >56 women);hypertension     Objective:    Today's Vitals   01/08/23 1425  Weight: 181 lb (82.1 kg)  Height: 5\' 1"  (1.549 m)   Body mass index is 34.2 kg/m.     01/08/2023    2:31 PM 09/27/2020    2:48 PM 01/28/2020    9:30 AM 12/17/2019    9:19 AM 11/05/2019    9:29 AM 09/14/2019   11:01 AM 08/25/2019    9:52 AM  Advanced Directives  Does Patient Have a Medical Advance Directive? No Yes No No No No No  Type of Furniture conservator/restorer;Living will       Copy of Healthcare Power of Attorney in Chart?  No - copy requested       Would patient like information on creating a medical advance directive? Yes (MAU/Ambulatory/Procedural Areas - Information given)  No - Patient declined No - Patient declined No - Patient declined No - Patient declined No - Patient declined    Current Medications (verified) Outpatient Encounter Medications as of 01/08/2023  Medication Sig   acalabrutinib maleate (CALQUENCE) 100 MG tablet Take 1 tablet (100 mg total) by mouth 2 (two) times daily.   amLODipine (NORVASC) 10 MG tablet TAKE 1 TABLET(10 MG) BY MOUTH DAILY   Cholecalciferol (VITAMIN D-3) 5000 units TABS Take 1 tablet by mouth daily.   diphenhydrAMINE (BENADRYL) 25 mg capsule Take 25 mg by mouth every 6 (six) hours as needed for itching or allergies.   lisinopril (ZESTRIL) 10 MG tablet Take 0.5  tablets (5 mg total) by mouth daily.   polyethylene glycol (MIRALAX / GLYCOLAX) 17 g packet Take 17 g by mouth daily as needed.   No facility-administered encounter medications on file as of 01/08/2023.    Allergies (verified) Crestor [rosuvastatin], Hctz [hydrochlorothiazide], and Lipitor [atorvastatin]   History: Past Medical History:  Diagnosis Date   Allergy    spring   Arthritis    osteo arthritis of right knee   Cataract    forming   Hyperlipidemia    Hypertension    Lymphoma, small-cell (HCC)    right axillary    Pneumonia    Past Surgical History:  Procedure Laterality Date   ABDOMINAL HYSTERECTOMY     AXILLARY LYMPH NODE BIOPSY Right 02/07/2016   back injections     BREAST SURGERY     COLONOSCOPY  2009   EYE SURGERY     retinal tear repair   KNEE SURGERY     torn ALC per pt   Family History  Problem Relation Age of Onset   Breast cancer Sister    Hyperlipidemia Son    Schizophrenia Mother    Colon cancer Neg Hx    Colon polyps Neg Hx    Esophageal cancer Neg Hx    Rectal cancer Neg Hx    Stomach cancer Neg Hx    Social History   Socioeconomic History  Marital status: Widowed    Spouse name: n/a   Number of children: 6   Years of education: 14   Highest education level: Not on file  Occupational History   Occupation: self-employed    Employer: [employer]    Comment: alterations and design  Tobacco Use   Smoking status: Never   Smokeless tobacco: Never  Vaping Use   Vaping Use: Never used  Substance and Sexual Activity   Alcohol use: No    Alcohol/week: 0.0 standard drinks of alcohol   Drug use: No   Sexual activity: Never  Other Topics Concern   Not on file  Social History Narrative   Lives alone.   Her adult children live nearby.   Social Determinants of Health   Financial Resource Strain: Low Risk  (01/08/2023)   Overall Financial Resource Strain (CARDIA)    Difficulty of Paying Living Expenses: Not hard at all  Food  Insecurity: No Food Insecurity (01/08/2023)   Hunger Vital Sign    Worried About Running Out of Food in the Last Year: Never true    Ran Out of Food in the Last Year: Never true  Transportation Needs: No Transportation Needs (01/08/2023)   PRAPARE - Administrator, Civil Service (Medical): No    Lack of Transportation (Non-Medical): No  Physical Activity: Sufficiently Active (01/08/2023)   Exercise Vital Sign    Days of Exercise per Week: 5 days    Minutes of Exercise per Session: 30 min  Stress: No Stress Concern Present (01/08/2023)   [employer] of Occupational Health - Occupational Stress Questionnaire    Feeling of Stress : Not at all  Social Connections: Moderately Isolated (01/08/2023)   Social Connection and Isolation Panel [NHANES]    Frequency of Communication with Friends and Family: More than three times a week    Frequency of Social Gatherings with Friends and Family: Three times a week    Attends Religious Services: More than 4 times per year    Active Member of Clubs or Organizations: No    Attends Banker Meetings: Never    Marital Status: Widowed    Tobacco Counseling Counseling given: Not Answered   Clinical Intake:  Pre-visit preparation completed: Yes  Pain : No/denies pain     Diabetes: No  How often do you need to have someone help you when you read instructions, pamphlets, or other written materials from your doctor or pharmacy?: 1 - Never  Interpreter Needed?: No  Information entered by :: Kandis Fantasia LPN   Activities of Daily Living    01/08/2023    2:31 PM  In your present state of health, do you have any difficulty performing the following activities:  Hearing? 0  Vision? 0  Difficulty concentrating or making decisions? 0  Walking or climbing stairs? 0  Dressing or bathing? 0  Doing errands, shopping? 0  Preparing Food and eating ? N  Using the Toilet? N  In the past six months, have you accidently leaked  urine? N  Do you have problems with loss of bowel control? N  Managing your Medications? N  Managing your Finances? N  Housekeeping or managing your Housekeeping? N    Patient Care Team: Tower, Audrie Gallus, MD as PCP - General (Family Medicine) Liberty Handy, OD as Consulting Physician (Optometry) Johney Maine, MD as Consulting Physician (Hematology)  Indicate any recent Medical Services you may have received from other than [facility] providers in the  past year (date may be approximate).     Assessment:   This is a routine wellness examination for Ciera.  Hearing/Vision screen Hearing Screening - Comments:: Denies hearing difficulties   Vision Screening - Comments:: Wears rx glasses - up to date with routine [facility] exams with [facility]   Dietary issues and exercise activities discussed:     Goals Addressed             This Visit's Progress    Remain active and independent         Depression Screen    01/08/2023    2:27 PM 10/12/2022    9:24 AM 10/10/2021    3:02 PM 09/27/2020    2:50 PM 07/22/2019    9:41 AM 07/14/2018   10:17 AM 07/08/2017   11:35 AM  PHQ 2/9 Scores  PHQ - 2 Score 0 0 0 0 0 0 1  PHQ- 9 Score    0       Fall Risk    01/08/2023    2:31 PM 10/12/2022    9:24 AM 10/10/2021    3:02 PM 09/27/2020    2:49 PM 07/22/2019    9:41 AM  Fall Risk   Falls in the past year? 0 0 0 0 0  Number falls in past yr: 0 0  0 0  Injury with Fall? 0 0  0   Risk for fall due to : No Fall Risks No Fall Risks  Medication side effect   Follow up Falls prevention discussed;Education provided;Falls evaluation completed Falls evaluation completed Falls evaluation completed Falls evaluation completed;Falls prevention discussed Falls evaluation completed    MEDICARE RISK AT HOME:  Medicare Risk at Home - 01/08/23 1431     Any stairs in or around the home? No    If so, are there any without handrails? No    Home free of loose throw rugs in walkways, pet beds,  electrical cords, etc? Yes    Adequate lighting in your home to reduce risk of falls? Yes    Life alert? No    Use of a cane, walker or w/c? No    Grab bars in the bathroom? Yes    Shower chair or bench in shower? Yes    Elevated toilet seat or a handicapped toilet? Yes             TIMED UP AND GO:  Was the test performed?  No    Cognitive Function:    09/27/2020    2:52 PM 06/27/2016   11:00 AM  MMSE - Mini Mental State Exam  Orientation to time 5 5  Orientation to Place 5 5  Registration 3 3  Attention/ Calculation 5 0  Recall 3 3  Language- name 2 objects  0  Language- repeat 1 1  Language- follow 3 step command  3  Language- read & follow direction  0  Write a sentence  0  Copy design  0  Total score  20        01/08/2023    2:31 PM  6CIT Screen  What Year? 0 points  What month? 0 points  What time? 0 points  Count back from 20 0 points  Months in reverse 0 points  Repeat phrase 0 points  Total Score 0 points    Immunizations Immunization History  Administered Date(s) Administered   Fluad Quad(high Dose 65+) 05/18/2020, 06/09/2021, 06/13/2022   Influenza Split 04/23/2012   Influenza,inj,Quad PF,6+ Mos  04/29/2013, 07/04/2016, 07/08/2017, 04/21/2019   PFIZER(Purple Top)SARS-COV-2 Vaccination 08/30/2019, 09/23/2019, 03/24/2020   Pneumococcal Conjugate-13 07/04/2016   Pneumococcal Polysaccharide-23 12/15/2010, 07/08/2017   Td 03/03/2009   Tdap 08/25/2014    TDAP status: Up to date  Pneumococcal vaccine status: Up to date  Covid-19 vaccine status: Information provided on how to obtain vaccines.   Qualifies for Shingles Vaccine? Yes   Zostavax completed No   Shingrix Completed?: No.    Education has been provided regarding the importance of this vaccine. Patient has been advised to call insurance company to determine out of pocket expense if they have not yet received this vaccine. Advised may also receive vaccine at local pharmacy or Health Dept.  Verbalized acceptance and understanding.  Screening Tests Health Maintenance  Topic Date Due   DEXA SCAN  Never done   MAMMOGRAM  10/10/2018   COVID-19 Vaccine (4 - 2023-24 season) 03/09/2022   Zoster Vaccines- Shingrix (1 of 2) 01/11/2023 (Originally 09/27/1964)   INFLUENZA VACCINE  02/07/2023   Medicare Annual Wellness (AWV)  01/08/2024   DTaP/Tdap/Td (3 - Td or Tdap) 08/25/2024   Pneumonia Vaccine 21+ Years old  Completed   Hepatitis C Screening  Completed   HPV VACCINES  Aged Out   Colonoscopy  Discontinued    Health Maintenance  Health Maintenance Due  Topic Date Due   DEXA SCAN  Never done   MAMMOGRAM  10/10/2018   COVID-19 Vaccine (4 - 2023-24 season) 03/09/2022    Colorectal cancer screening: Type of screening: Colonoscopy. Completed 04/14/18. Repeat every - years  Mammogram status: No longer required due to age and preference.  Bone Density status: Ordered 10/2022. Pt provided with contact info and advised to call to schedule appt.  Lung Cancer Screening: (Low Dose CT Chest recommended if Age 43-80 years, 20 pack-year currently smoking OR have quit w/in 15years.) does not qualify.   Lung Cancer Screening Referral: n/a  Additional Screening:  Hepatitis C Screening: does qualify; Completed 06/27/16  Vision Screening: Recommended annual ophthalmology exams for early detection of glaucoma and other disorders of the eye. Is the patient up to date with their annual eye exam?  Yes  Who is the provider or what is the name of the office in which the patient attends annual eye exams? [facility] If pt is not established with a provider, would they like to be referred to a provider to establish care? No .   Dental Screening: Recommended annual dental exams for proper oral hygiene  Community Resource Referral / Chronic Care Management: CRR required this visit?  No   CCM required this visit?  No     Plan:     I have personally reviewed and noted the  following in the patient's chart:   Medical and social history Use of alcohol, tobacco or illicit drugs  Current medications and supplements including opioid prescriptions. Patient is not currently taking opioid prescriptions. Functional ability and status Nutritional status Physical activity Advanced directives List of other physicians Hospitalizations, surgeries, and ER visits in previous 12 months Vitals Screenings to include cognitive, depression, and falls Referrals and appointments  In addition, I have reviewed and discussed with patient certain preventive protocols, quality metrics, and best practice recommendations. A written personalized care plan for preventive services as well as general preventive health recommendations were provided to patient.     Kandis Fantasia [place], [place]   07/14/1094   After Visit Summary: (MyChart) Due to this being a telephonic visit, the after visit summary  with patients personalized plan was offered to patient via MyChart   Nurse Notes: No concerns

## 2023-01-08 NOTE — Patient Instructions (Addendum)
[name] , Thank you for taking time to come for your Medicare Wellness Visit. I appreciate your ongoing commitment to your health goals. Please review the following plan we discussed and let me know if I can assist you in the future.   These are the goals we discussed:  Goals      Increase physical activity     Starting 06/27/16, I will continue to exercise at least 60 min 3-4 days per week.      Patient Stated     09/27/2020, I will maintain and continue medications as prescribed.      Remain active and independent        This is a list of the screening recommended for you and due dates:  Health Maintenance  Topic Date Due   DEXA scan (bone density measurement)  Never done   Mammogram  10/10/2018   COVID-19 Vaccine (4 - 2023-24 season) 03/09/2022   Zoster (Shingles) Vaccine (1 of 2) 01/11/2023*   Flu Shot  02/07/2023   Medicare Annual Wellness Visit  01/08/2024   DTaP/Tdap/Td vaccine (3 - Td or Tdap) 08/25/2024   Pneumonia Vaccine  Completed   Hepatitis C Screening  Completed   HPV Vaccine  Aged Out   Colon Cancer Screening  Discontinued  *Topic was postponed. The date shown is not the original due date.    Advanced directives: Information on Advanced Care Planning can be found at [facility] Advance Health Care Directives Advance Health Care Directives ([email or web address] Please bring a copy of your health care power of attorney and living will to the office to be added to your chart at your convenience.  Conditions/risks identified: Aim for 30 minutes of exercise or brisk walking, 6-8 glasses of water, and 5 servings of fruits and vegetables each day.  Next appointment: Follow up in one year for your annual wellness visit    Preventive Care 65 Years and Older, Female Preventive care refers to lifestyle choices and visits with your health care provider that can promote health and wellness. What does preventive care include? A yearly physical exam. This  is also called an annual well check. Dental exams once or twice a year. Routine eye exams. Ask your health care provider how often you should have your eyes checked. Personal lifestyle choices, including: Daily care of your teeth and gums. Regular physical activity. Eating a healthy diet. Avoiding tobacco and drug use. Limiting alcohol use. Practicing safe sex. Taking low-dose aspirin every day. Taking vitamin and mineral supplements as recommended by your health care provider. What happens during an annual well check? The services and screenings done by your health care provider during your annual well check will depend on your age, overall health, lifestyle risk factors, and family history of disease. Counseling  Your health care provider may ask you questions about your: Alcohol use. Tobacco use. Drug use. Emotional well-being. Home and relationship well-being. Sexual activity. Eating habits. History of falls. Memory and ability to understand (cognition). Work and work Astronomer. Reproductive health. Screening  You may have the following tests or measurements: Height, weight, and BMI. Blood pressure. Lipid and cholesterol levels. These may be checked every 5 years, or more frequently if you are over 52 years old. Skin check. Lung cancer screening. You may have this screening every year starting at age 19 if you have a 30-pack-year history of smoking and currently smoke or have quit within the past 15 years. Fecal occult blood test (FOBT) of the  stool. You may have this test every year starting at age 41. Flexible sigmoidoscopy or colonoscopy. You may have a sigmoidoscopy every 5 years or a colonoscopy every 10 years starting at age 14. Hepatitis C blood test. Hepatitis B blood test. Sexually transmitted disease (STD) testing. Diabetes screening. This is done by checking your blood sugar (glucose) after you have not eaten for a while (fasting). You may have this done every  1-3 years. Bone density scan. This is done to screen for osteoporosis. You may have this done starting at age 72. Mammogram. This may be done every 1-2 years. Talk to your health care provider about how often you should have regular mammograms. Talk with your health care provider about your test results, treatment options, and if necessary, the need for more tests. Vaccines  Your health care provider may recommend certain vaccines, such as: Influenza vaccine. This is recommended every year. Tetanus, diphtheria, and acellular pertussis (Tdap, Td) vaccine. You may need a Td booster every 10 years. Zoster vaccine. You may need this after age 75. Pneumococcal 13-valent conjugate (PCV13) vaccine. One dose is recommended after age 61. Pneumococcal polysaccharide (PPSV23) vaccine. One dose is recommended after age 46. Talk to your health care provider about which screenings and vaccines you need and how often you need them. This information is not intended to replace advice given to you by your health care provider. Make sure you discuss any questions you have with your health care provider. Document Released: 07/22/2015 Document Revised: 03/14/2016 Document Reviewed: 04/26/2015 Elsevier Interactive Patient Education  2017 [employer].  Fall Prevention in the Home Falls can cause injuries. They can happen to people of all ages. There are many things you can do to make your home safe and to help prevent falls. What can I do on the outside of my home? Regularly fix the edges of walkways and driveways and fix any cracks. Remove anything that might make you trip as you walk through a door, such as a raised step or threshold. Trim any bushes or trees on the path to your home. Use bright outdoor lighting. Clear any walking paths of anything that might make someone trip, such as rocks or tools. Regularly check to see if handrails are loose or broken. Make sure that both sides of any steps have  handrails. Any raised decks and porches should have guardrails on the edges. Have any leaves, snow, or ice cleared regularly. Use sand or salt on walking paths during winter. Clean up any spills in your garage right away. This includes oil or grease spills. What can I do in the bathroom? Use night lights. Install grab bars by the toilet and in the tub and shower. Do not use towel bars as grab bars. Use non-skid mats or decals in the tub or shower. If you need to sit down in the shower, use a plastic, non-slip stool. Keep the floor dry. Clean up any water that spills on the floor as soon as it happens. Remove soap buildup in the tub or shower regularly. Attach bath mats securely with double-sided non-slip rug tape. Do not have throw rugs and other things on the floor that can make you trip. What can I do in the bedroom? Use night lights. Make sure that you have a light by your bed that is easy to reach. Do not use any sheets or blankets that are too big for your bed. They should not hang down onto the floor. Have a firm chair that  has side arms. You can use this for support while you get dressed. Do not have throw rugs and other things on the floor that can make you trip. What can I do in the kitchen? Clean up any spills right away. Avoid walking on wet floors. Keep items that you use a lot in easy-to-reach places. If you need to reach something above you, use a strong step stool that has a grab bar. Keep electrical cords out of the way. Do not use floor polish or wax that makes floors slippery. If you must use wax, use non-skid floor wax. Do not have throw rugs and other things on the floor that can make you trip. What can I do with my stairs? Do not leave any items on the stairs. Make sure that there are handrails on both sides of the stairs and use them. Fix handrails that are broken or loose. Make sure that handrails are as long as the stairways. Check any carpeting to make sure  that it is firmly attached to the stairs. Fix any carpet that is loose or worn. Avoid having throw rugs at the top or bottom of the stairs. If you do have throw rugs, attach them to the floor with carpet tape. Make sure that you have a light switch at the top of the stairs and the bottom of the stairs. If you do not have them, ask someone to add them for you. What else can I do to help prevent falls? Wear shoes that: Do not have high heels. Have rubber bottoms. Are comfortable and fit you well. Are closed at the toe. Do not wear sandals. If you use a stepladder: Make sure that it is fully opened. Do not climb a closed stepladder. Make sure that both sides of the stepladder are locked into place. Ask someone to hold it for you, if possible. Clearly mark and make sure that you can see: Any grab bars or handrails. First and last steps. Where the edge of each step is. Use tools that help you move around (mobility aids) if they are needed. These include: Canes. Walkers. Scooters. Crutches. Turn on the lights when you go into a dark area. Replace any light bulbs as soon as they burn out. Set up your furniture so you have a clear path. Avoid moving your furniture around. If any of your floors are uneven, fix them. If there are any pets around you, be aware of where they are. Review your medicines with your doctor. Some medicines can make you feel dizzy. This can increase your chance of falling. Ask your doctor what other things that you can do to help prevent falls. This information is not intended to replace advice given to you by your health care provider. Make sure you discuss any questions you have with your health care provider. Document Released: 04/21/2009 Document Revised: 12/01/2015 Document Reviewed: 07/30/2014 Elsevier Interactive Patient Education  2017 [employer]. .

## 2023-01-09 DIAGNOSIS — M9902 Segmental and somatic dysfunction of thoracic region: Secondary | ICD-10-CM | POA: Diagnosis not present

## 2023-01-09 DIAGNOSIS — M9905 Segmental and somatic dysfunction of pelvic region: Secondary | ICD-10-CM | POA: Diagnosis not present

## 2023-01-09 DIAGNOSIS — M5137 Other intervertebral disc degeneration, lumbosacral region: Secondary | ICD-10-CM | POA: Diagnosis not present

## 2023-01-09 DIAGNOSIS — M9901 Segmental and somatic dysfunction of cervical region: Secondary | ICD-10-CM | POA: Diagnosis not present

## 2023-01-09 DIAGNOSIS — M9903 Segmental and somatic dysfunction of lumbar region: Secondary | ICD-10-CM | POA: Diagnosis not present

## 2023-01-09 DIAGNOSIS — S138XXA Sprain of joints and ligaments of other parts of neck, initial encounter: Secondary | ICD-10-CM | POA: Diagnosis not present

## 2023-01-09 DIAGNOSIS — M5136 Other intervertebral disc degeneration, lumbar region: Secondary | ICD-10-CM | POA: Diagnosis not present

## 2023-01-09 DIAGNOSIS — M47814 Spondylosis without myelopathy or radiculopathy, thoracic region: Secondary | ICD-10-CM | POA: Diagnosis not present

## 2023-01-11 ENCOUNTER — Other Ambulatory Visit: Payer: Self-pay

## 2023-01-11 DIAGNOSIS — C9110 Chronic lymphocytic leukemia of B-cell type not having achieved remission: Secondary | ICD-10-CM

## 2023-01-14 ENCOUNTER — Other Ambulatory Visit: Payer: Self-pay

## 2023-01-14 ENCOUNTER — Inpatient Hospital Stay: Payer: Medicare PPO | Admitting: Hematology

## 2023-01-14 ENCOUNTER — Inpatient Hospital Stay: Payer: Medicare PPO | Attending: Hematology

## 2023-01-14 VITALS — BP 170/71 | HR 66 | Temp 97.3°F | Resp 18 | Wt 179.9 lb

## 2023-01-14 DIAGNOSIS — C9110 Chronic lymphocytic leukemia of B-cell type not having achieved remission: Secondary | ICD-10-CM | POA: Diagnosis not present

## 2023-01-14 LAB — CMP (CANCER CENTER ONLY)
ALT: 10 U/L (ref 0–44)
AST: 11 U/L — ABNORMAL LOW (ref 15–41)
Albumin: 3.9 g/dL (ref 3.5–5.0)
Alkaline Phosphatase: 57 U/L (ref 38–126)
Anion gap: 5 (ref 5–15)
BUN: 29 mg/dL — ABNORMAL HIGH (ref 8–23)
CO2: 27 mmol/L (ref 22–32)
Calcium: 9.7 mg/dL (ref 8.9–10.3)
Chloride: 109 mmol/L (ref 98–111)
Creatinine: 1.32 mg/dL — ABNORMAL HIGH (ref 0.44–1.00)
GFR, Estimated: 42 mL/min — ABNORMAL LOW (ref >60)
Glucose, Bld: 82 mg/dL (ref 70–99)
Potassium: 4 mmol/L (ref 3.5–5.1)
Sodium: 141 mmol/L (ref 135–145)
Total Bilirubin: 0.4 mg/dL (ref 0.3–1.2)
Total Protein: 6.7 g/dL (ref 6.5–8.1)

## 2023-01-14 LAB — CBC WITH DIFFERENTIAL (CANCER CENTER ONLY)
Abs Immature Granulocytes: 0.01 10*3/uL (ref 0.00–0.07)
Basophils Absolute: 0 10*3/uL (ref 0.0–0.1)
Basophils Relative: 1 %
Eosinophils Absolute: 0.3 10*3/uL (ref 0.0–0.5)
Eosinophils Relative: 4 %
HCT: 38.3 % (ref 36.0–46.0)
Hemoglobin: 11.6 g/dL — ABNORMAL LOW (ref 12.0–15.0)
Immature Granulocytes: 0 %
Lymphocytes Relative: 53 %
Lymphs Abs: 3.3 10*3/uL (ref 0.7–4.0)
MCH: 26.6 pg (ref 26.0–34.0)
MCHC: 30.3 g/dL (ref 30.0–36.0)
MCV: 87.8 fL (ref 80.0–100.0)
Monocytes Absolute: 0.4 10*3/uL (ref 0.1–1.0)
Monocytes Relative: 7 %
Neutro Abs: 2.1 10*3/uL (ref 1.7–7.7)
Neutrophils Relative %: 35 %
Platelet Count: 203 10*3/uL (ref 150–400)
RBC: 4.36 MIL/uL (ref 3.87–5.11)
RDW: 14.1 % (ref 11.5–15.5)
WBC Count: 6.2 10*3/uL (ref 4.0–10.5)
nRBC: 0 % (ref 0.0–0.2)

## 2023-01-14 LAB — LACTATE DEHYDROGENASE: LDH: 141 U/L (ref 98–192)

## 2023-01-14 NOTE — Progress Notes (Signed)
HEMATOLOGY/ONCOLOGY CLINIC NOTE  Date of Service: 01/14/23  Patient Care Team: Tower, Audrie Gallus, MD as PCP - General (Family Medicine) Liberty Handy, OD as Consulting Physician (Optometry) Candise Che, Corene Cornea, MD as Consulting Physician (Hematology)  CHIEF COMPLAINTS/PURPOSE OF CONSULTATION:  Follow-up for continued evaluation and management of CLL  HISTORY OF PRESENTING ILLNESS:  Please see previous notes for details on initial presentation.  INTERVAL HISTORY:   Erica Carlson is a 77 y.o. 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  Patient was last seen by me on 10/15/2022 and she complained of bilateral leg pain and aches mostly neat her knees, fatigue and fatigue.   Patient notes she has been doing well overall since our last visit. She complains of continuous joint pains and body aches. She notes that her body aches and joint pain has been causing her to be more fatigue and less physically active.  She notes that her joint pain and body aches started when she started taking Acalabrutinib.   She denies any new infection issues, fever, chills, night sweats, new lumps/bumps, skin rashes, abdominal pain, chest pain, back pain, or abnormal bowel movement. She does have mild bilateral leg swelling.   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HCC)    right axillary    Pneumonia     SURGICAL HISTORY: Past Surgical History:  Procedure Laterality Date   ABDOMINAL HYSTERECTOMY     AXILLARY LYMPH NODE BIOPSY Right 02/07/2016   back injections     BREAST SURGERY     COLONOSCOPY  2009   EYE SURGERY     retinal tear repair   KNEE SURGERY     torn ALC per pt    SOCIAL HISTORY: Social History   Socioeconomic History   Marital status: Widowed    Spouse  name: n/a   Number of children: 6   Years of education: 14   Highest education level: Not on file  Occupational History   Occupation: self-employed    Employer: [employer]    Comment: alterations and design  Tobacco Use   Smoking status: Never   Smokeless tobacco: Never  Vaping Use   Vaping Use: Never used  Substance and Sexual Activity   Alcohol use: No    Alcohol/week: 0.0 standard drinks of alcohol   Drug use: No   Sexual activity: Never  Other Topics Concern   Not on file  Social History Narrative   Lives alone.   Her adult children live nearby.   Social Determinants of Health   Financial Resource Strain: Low Risk  (01/08/2023)   Overall Financial Resource Strain (CARDIA)    Difficulty of Paying Living Expenses: Not hard at all  Food Insecurity: No Food Insecurity (01/08/2023)   Hunger Vital Sign    Worried About Running Out of Food in the Last Year: Never true    Ran Out of Food in the Last Year: Never true  Transportation Needs: No Transportation Needs (01/08/2023)   PRAPARE - Administrator, Civil Service (Medical): No    Lack of Transportation (Non-Medical): No  Physical Activity: Sufficiently Active (01/08/2023)   Exercise Vital Sign    Days of Exercise per Week: 5  days    Minutes of Exercise per Session: 30 min  Stress: No Stress Concern Present (01/08/2023)   [employer] of Occupational Health - Occupational Stress Questionnaire    Feeling of Stress : Not at all  Social Connections: Moderately Isolated (01/08/2023)   Social Connection and Isolation Panel [NHANES]    Frequency of Communication with Friends and Family: More than three times a week    Frequency of Social Gatherings with Friends and Family: Three times a week    Attends Religious Services: More than 4 times per year    Active Member of Clubs or Organizations: No    Attends Banker Meetings: Never    Marital Status: Widowed  Intimate Partner Violence: Not At Risk  (01/08/2023)   Humiliation, Afraid, Rape, and Kick questionnaire    Fear of Current or Ex-Partner: No    Emotionally Abused: No    Physically Abused: No    Sexually Abused: No    FAMILY HISTORY: Family History  Problem Relation Age of Onset   Breast cancer Sister    Hyperlipidemia Son    Schizophrenia Mother    Colon cancer Neg Hx    Colon polyps Neg Hx    Esophageal cancer Neg Hx    Rectal cancer Neg Hx    Stomach cancer Neg Hx     ALLERGIES:  is allergic to crestor [rosuvastatin], hctz [hydrochlorothiazide], and lipitor [atorvastatin].  MEDICATIONS:  Current Outpatient Medications  Medication Sig Dispense Refill   acalabrutinib maleate (CALQUENCE) 100 MG tablet Take 1 tablet (100 mg total) by mouth 2 (two) times daily. 60 tablet 5   amLODipine (NORVASC) 10 MG tablet TAKE 1 TABLET(10 MG) BY MOUTH DAILY 90 tablet 3   Cholecalciferol (VITAMIN D-3) 5000 units TABS Take 1 tablet by mouth daily.     diphenhydrAMINE (BENADRYL) 25 mg capsule Take 25 mg by mouth every 6 (six) hours as needed for itching or allergies.     lisinopril (ZESTRIL) 10 MG tablet Take 0.5 tablets (5 mg total) by mouth daily. 45 tablet 3   polyethylene glycol (MIRALAX / GLYCOLAX) 17 g packet Take 17 g by mouth daily as needed.     No current facility-administered medications for this visit.    REVIEW OF SYSTEMS:   10 Point review of Systems was done is negative except as noted above.  PHYSICAL EXAMINATION: .BP (!) 170/71   Pulse 66   Temp (!) 97.3 F (36.3 C)   Resp 18   Wt 179 lb 14.4 oz (81.6 kg)   SpO2 99%   BMI 33.99 kg/m  . GENERAL:alert, in no acute distress and comfortable SKIN: no acute rashes, no significant lesions EYES: conjunctiva are pink and non-injected, sclera anicteric OROPHARYNX: MMM, no exudates, no oropharyngeal erythema or ulceration NECK: supple, no JVD LYMPH:  no palpable lymphadenopathy in the cervical, axillary or inguinal regions LUNGS: clear to auscultation b/l with  normal respiratory effort HEART: regular rate & rhythm ABDOMEN:  normoactive bowel sounds , non tender, not distended. Extremity: no pedal edema PSYCH: alert & oriented x 3 with fluent speech NEURO: no focal motor/sensory deficits   LABORATORY DATA:  I have reviewed the data as listed  .    Latest Ref Rng & Units 01/14/2023    9:11 AM 10/15/2022    9:11 AM 10/04/2022    8:11 AM  CBC  WBC 4.0 - 10.5 K/uL 6.2  6.3  4.3   Hemoglobin 12.0 - 15.0 g/dL 95.1  88.4  11.2   Hematocrit 36.0 - 46.0 % 38.3  34.5  35.0   Platelets 150 - 400 K/uL 203  229  235.0     .    Latest Ref Rng & Units 01/14/2023    9:11 AM 10/15/2022    9:11 AM 10/04/2022    8:11 AM  CMP  Glucose 70 - 99 mg/dL 82  93  95   BUN 8 - 23 mg/dL 29  24  19    Creatinine 0.44 - 1.00 mg/dL 4.33  2.95  1.88   Sodium 135 - 145 mmol/L 141  138  141   Potassium 3.5 - 5.1 mmol/L 4.0  3.9  3.8   Chloride 98 - 111 mmol/L 109  105  108   CO2 22 - 32 mmol/L 27  27  26    Calcium 8.9 - 10.3 mg/dL 9.7  41.6  9.4   Total Protein 6.5 - 8.1 g/dL 6.7  7.0  6.0   Total Bilirubin 0.3 - 1.2 mg/dL 0.4  0.4  0.3   Alkaline Phos 38 - 126 U/L 57  57  56   AST 15 - 41 U/L 11  13  22    ALT 0 - 44 U/L 10  16  16     . Lab Results  Component Value Date   LDH 182 10/15/2022     09/22/18 Right Axillary LN Biopsy:    07/31/18 Molecular Pathology:     02/21/16 Biopsy:    RADIOGRAPHIC STUDIES: I have personally reviewed the radiological images as listed and agreed with the findings in the report. No results found.   ASSESSMENT & PLAN:   77 y.o. female with  1. Chronic Lymphocytic Leukemia  02/21/16 Right Axillary LN biopsy revealed SLL, the initial diagnosis. 07/31/18 FISH CLL Prognostic Panel revealed:52.0% cells with 11q deletion Labs upon initial presentation from 09/22/18, WBC at 16.8k, HGB at 10.9, PLT at 272k. ANC at 1.2k, Lymphs at 11.3k, Monocytes at 4.1k. 09/22/18 Right Axillary LN Biopsy revealed SLL, ruling out a  transformation event  08/15/18 PET/CT revealed Bulky lymphadenopathy in the neck, chest, abdomen, and pelvis demonstrating low level hypermetabolism throughout. 9 mm right thyroid nodule is hypermetabolic. Thyroid ultrasound recommended to further evaluate. Mucosal thickening with air-fluid level in the left maxillary sinus. 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  08/12/2019 PET/CT scan ([id number]) revealed disease progression in neck, chest, abdomen, pelvis and interval development of pulmonary lesions  03/23/2020 CT C/A/P ([id number]) ([id number]) revealed. "1. Significant positive response to therapy. No new or progressive disease."  PLAN: -Discussed lab results from today, 01/14/2023, with the patient. CBC is stable with hemoglobin of 11.6 g/dL. CMP stable with mild chronic kidney disease.  -LDH is .wnl Lab Results  Component Value Date   LDH 141 01/14/2023   -Patient has been tolerating Acalabrutinib well with mild toxicities, including joint pain and body aches.  -We will reduce Acalabrutinib from 100 mg 2 times a day to 100 mg once a day due to grade 2 bodyaches  FOLLOW UP: RTC with Dr Candise Che with labs in 3 months  The total time spent in the appointment was 20 minutes* .  All of the patient's questions were answered with apparent satisfaction. The patient knows to call the clinic with any problems, questions or concerns.   Wyvonnia Lora MD MS AAHIVMS [facility] Hematology/Oncology Physician [facility]  .*Total Encounter Time as defined by the Centers for Medicare and Medicaid Services includes, in addition to the face-to-face  time of a patient visit (documented in the note above) non-face-to-face time: obtaining and reviewing outside history, ordering and reviewing medications, tests or procedures, care coordination (communications with other health care professionals or caregivers) and documentation in the medical record.   I, Ok Edwards, am  acting as a Neurosurgeon for Wyvonnia Lora, MD. .I have reviewed the above documentation for accuracy and completeness, and I agree with the above. Johney Maine MD

## 2023-01-22 ENCOUNTER — Telehealth: Payer: Self-pay | Admitting: Hematology

## 2023-01-22 NOTE — Telephone Encounter (Signed)
Patient is aware of upcoming appointment times/dates.  

## 2023-02-01 ENCOUNTER — Other Ambulatory Visit (HOSPITAL_COMMUNITY): Payer: Self-pay

## 2023-02-04 ENCOUNTER — Other Ambulatory Visit: Payer: Self-pay | Admitting: Hematology

## 2023-02-04 ENCOUNTER — Other Ambulatory Visit (HOSPITAL_COMMUNITY): Payer: Self-pay

## 2023-02-04 ENCOUNTER — Other Ambulatory Visit: Payer: Self-pay

## 2023-02-04 DIAGNOSIS — C9110 Chronic lymphocytic leukemia of B-cell type not having achieved remission: Secondary | ICD-10-CM

## 2023-02-04 MED ORDER — CALQUENCE 100 MG PO TABS
100.0000 mg | ORAL_TABLET | Freq: Two times a day (BID) | ORAL | 5 refills | Status: DC
  Filled 2023-02-04: qty 60, 30d supply, fill #0

## 2023-02-05 ENCOUNTER — Other Ambulatory Visit: Payer: Self-pay

## 2023-02-05 ENCOUNTER — Other Ambulatory Visit (HOSPITAL_COMMUNITY): Payer: Self-pay

## 2023-02-05 DIAGNOSIS — C9110 Chronic lymphocytic leukemia of B-cell type not having achieved remission: Secondary | ICD-10-CM

## 2023-02-05 MED ORDER — CALQUENCE 100 MG PO TABS
100.0000 mg | ORAL_TABLET | Freq: Every day | ORAL | 5 refills | Status: DC
  Filled 2023-02-05: qty 60, 60d supply, fill #0
  Filled 2023-03-12: qty 30, 30d supply, fill #0
  Filled 2023-05-14 (×2): qty 30, 30d supply, fill #1
  Filled 2023-06-07: qty 30, 30d supply, fill #2
  Filled 2023-07-02: qty 30, 30d supply, fill #3
  Filled 2023-08-07: qty 30, 30d supply, fill #4
  Filled 2023-09-09: qty 30, 30d supply, fill #5

## 2023-02-05 NOTE — Progress Notes (Unsigned)
    Deacon Gadbois T. Mariadel Mruk, MD, CAQ Sports Medicine [facility] at [facility] [address] [place], [address]  Phone: [phone]  FAX: [phone]  Erica Carlson - 77 y.o. female  MRN [id number]  Date of Birth: [birth year]  Date: 02/06/2023  PCP: Judy Pimple, MD  Referral: Judy Pimple, MD  No chief complaint on file.  Subjective:   Erica Carlson is a 77 y.o. very pleasant female patient with There is no height or weight on file to calculate BMI. who presents with the following:  She presents in follow-up for right-sided fifth digit mallet finger.  She has been immobilized in a stack splint.    Review of Systems is noted in the HPI, as appropriate  Objective:   There were no vitals taken for this visit.  GEN: No acute distress; alert,appropriate. PULM: Breathing comfortably in no respiratory distress PSYCH: Normally interactive.   Laboratory and Imaging Data:  Assessment and Plan:   ***

## 2023-02-06 ENCOUNTER — Other Ambulatory Visit: Payer: Self-pay

## 2023-02-06 ENCOUNTER — Ambulatory Visit: Payer: Medicare PPO | Admitting: Family Medicine

## 2023-02-06 ENCOUNTER — Encounter: Payer: Self-pay | Admitting: Family Medicine

## 2023-02-06 VITALS — BP 124/68 | HR 77 | Temp 97.8°F | Ht 61.0 in | Wt 177.5 lb

## 2023-02-06 DIAGNOSIS — M20011 Mallet finger of right finger(s): Secondary | ICD-10-CM | POA: Diagnosis not present

## 2023-02-19 ENCOUNTER — Other Ambulatory Visit (HOSPITAL_COMMUNITY): Payer: Self-pay

## 2023-02-21 ENCOUNTER — Other Ambulatory Visit (HOSPITAL_COMMUNITY): Payer: Self-pay

## 2023-02-22 DIAGNOSIS — M9903 Segmental and somatic dysfunction of lumbar region: Secondary | ICD-10-CM | POA: Diagnosis not present

## 2023-02-22 DIAGNOSIS — M5136 Other intervertebral disc degeneration, lumbar region: Secondary | ICD-10-CM | POA: Diagnosis not present

## 2023-02-22 DIAGNOSIS — M47814 Spondylosis without myelopathy or radiculopathy, thoracic region: Secondary | ICD-10-CM | POA: Diagnosis not present

## 2023-02-22 DIAGNOSIS — M5137 Other intervertebral disc degeneration, lumbosacral region: Secondary | ICD-10-CM | POA: Diagnosis not present

## 2023-02-22 DIAGNOSIS — S138XXA Sprain of joints and ligaments of other parts of neck, initial encounter: Secondary | ICD-10-CM | POA: Diagnosis not present

## 2023-02-22 DIAGNOSIS — M9902 Segmental and somatic dysfunction of thoracic region: Secondary | ICD-10-CM | POA: Diagnosis not present

## 2023-02-22 DIAGNOSIS — M9905 Segmental and somatic dysfunction of pelvic region: Secondary | ICD-10-CM | POA: Diagnosis not present

## 2023-02-22 DIAGNOSIS — M9901 Segmental and somatic dysfunction of cervical region: Secondary | ICD-10-CM | POA: Diagnosis not present

## 2023-03-06 ENCOUNTER — Other Ambulatory Visit (HOSPITAL_COMMUNITY): Payer: Self-pay

## 2023-03-07 ENCOUNTER — Other Ambulatory Visit (HOSPITAL_COMMUNITY): Payer: Self-pay

## 2023-03-12 ENCOUNTER — Other Ambulatory Visit: Payer: Self-pay

## 2023-03-12 ENCOUNTER — Other Ambulatory Visit (HOSPITAL_COMMUNITY): Payer: Self-pay

## 2023-03-26 DIAGNOSIS — M9901 Segmental and somatic dysfunction of cervical region: Secondary | ICD-10-CM | POA: Diagnosis not present

## 2023-03-26 DIAGNOSIS — S138XXA Sprain of joints and ligaments of other parts of neck, initial encounter: Secondary | ICD-10-CM | POA: Diagnosis not present

## 2023-03-26 DIAGNOSIS — M47814 Spondylosis without myelopathy or radiculopathy, thoracic region: Secondary | ICD-10-CM | POA: Diagnosis not present

## 2023-03-26 DIAGNOSIS — M9902 Segmental and somatic dysfunction of thoracic region: Secondary | ICD-10-CM | POA: Diagnosis not present

## 2023-03-26 DIAGNOSIS — M9903 Segmental and somatic dysfunction of lumbar region: Secondary | ICD-10-CM | POA: Diagnosis not present

## 2023-03-26 DIAGNOSIS — M9905 Segmental and somatic dysfunction of pelvic region: Secondary | ICD-10-CM | POA: Diagnosis not present

## 2023-03-26 DIAGNOSIS — M5136 Other intervertebral disc degeneration, lumbar region: Secondary | ICD-10-CM | POA: Diagnosis not present

## 2023-03-26 DIAGNOSIS — M5137 Other intervertebral disc degeneration, lumbosacral region: Secondary | ICD-10-CM | POA: Diagnosis not present

## 2023-04-10 ENCOUNTER — Other Ambulatory Visit: Payer: Self-pay

## 2023-04-15 ENCOUNTER — Other Ambulatory Visit: Payer: Self-pay

## 2023-04-15 DIAGNOSIS — C9110 Chronic lymphocytic leukemia of B-cell type not having achieved remission: Secondary | ICD-10-CM

## 2023-04-16 ENCOUNTER — Inpatient Hospital Stay: Payer: Medicare PPO | Attending: Hematology

## 2023-04-16 ENCOUNTER — Inpatient Hospital Stay: Payer: Medicare PPO | Admitting: Hematology

## 2023-04-16 VITALS — BP 172/81 | HR 63 | Temp 97.2°F | Resp 18 | Wt 180.5 lb

## 2023-04-16 DIAGNOSIS — Z79899 Other long term (current) drug therapy: Secondary | ICD-10-CM | POA: Insufficient documentation

## 2023-04-16 DIAGNOSIS — Z803 Family history of malignant neoplasm of breast: Secondary | ICD-10-CM | POA: Insufficient documentation

## 2023-04-16 DIAGNOSIS — C9110 Chronic lymphocytic leukemia of B-cell type not having achieved remission: Secondary | ICD-10-CM

## 2023-04-16 LAB — CBC WITH DIFFERENTIAL (CANCER CENTER ONLY)
Abs Immature Granulocytes: 0.02 10*3/uL (ref 0.00–0.07)
Basophils Absolute: 0 10*3/uL (ref 0.0–0.1)
Basophils Relative: 0 %
Eosinophils Absolute: 0.2 10*3/uL (ref 0.0–0.5)
Eosinophils Relative: 3 %
HCT: 39.4 % (ref 36.0–46.0)
Hemoglobin: 12 g/dL (ref 12.0–15.0)
Immature Granulocytes: 0 %
Lymphocytes Relative: 53 %
Lymphs Abs: 3 10*3/uL (ref 0.7–4.0)
MCH: 26.5 pg (ref 26.0–34.0)
MCHC: 30.5 g/dL (ref 30.0–36.0)
MCV: 87 fL (ref 80.0–100.0)
Monocytes Absolute: 0.4 10*3/uL (ref 0.1–1.0)
Monocytes Relative: 7 %
Neutro Abs: 2.2 10*3/uL (ref 1.7–7.7)
Neutrophils Relative %: 37 %
Platelet Count: 228 10*3/uL (ref 150–400)
RBC: 4.53 MIL/uL (ref 3.87–5.11)
RDW: 13.9 % (ref 11.5–15.5)
WBC Count: 5.8 10*3/uL (ref 4.0–10.5)
nRBC: 0 % (ref 0.0–0.2)

## 2023-04-16 LAB — CMP (CANCER CENTER ONLY)
ALT: 10 U/L (ref 0–44)
AST: 11 U/L — ABNORMAL LOW (ref 15–41)
Albumin: 4.2 g/dL (ref 3.5–5.0)
Alkaline Phosphatase: 66 U/L (ref 38–126)
Anion gap: 6 (ref 5–15)
BUN: 26 mg/dL — ABNORMAL HIGH (ref 8–23)
CO2: 26 mmol/L (ref 22–32)
Calcium: 9.8 mg/dL (ref 8.9–10.3)
Chloride: 106 mmol/L (ref 98–111)
Creatinine: 1.25 mg/dL — ABNORMAL HIGH (ref 0.44–1.00)
GFR, Estimated: 45 mL/min — ABNORMAL LOW (ref >60)
Glucose, Bld: 105 mg/dL — ABNORMAL HIGH (ref 70–99)
Potassium: 4.1 mmol/L (ref 3.5–5.1)
Sodium: 138 mmol/L (ref 135–145)
Total Bilirubin: 0.3 mg/dL (ref 0.3–1.2)
Total Protein: 6.6 g/dL (ref 6.5–8.1)

## 2023-04-16 LAB — LACTATE DEHYDROGENASE: LDH: 137 U/L (ref 98–192)

## 2023-04-16 NOTE — Progress Notes (Signed)
HEMATOLOGY/ONCOLOGY CLINIC NOTE  Date of Service: 04/16/23  Patient Care Team: Tower, Audrie Gallus, MD as PCP - General (Family Medicine) Liberty Handy, OD as Consulting Physician (Optometry) Candise Che, Corene Cornea, MD as Consulting Physician (Hematology)  CHIEF COMPLAINTS/PURPOSE OF CONSULTATION:  Follow-up for continued evaluation and management of CLL  HISTORY OF PRESENTING ILLNESS:  Please see previous notes for details on initial presentation.  INTERVAL HISTORY:   Erica Carlson is a 77 y.o. 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  Patient was last seen by me on 01/14/2023 and complained of continuous joint pains/body aches causing increased fatigue and limiting mobility. She also had mild bilateral leg swelling.   Today, she reports arthritis in her knees and other age-related symptoms. Patient has otherwise been feeling well overall with no fever, chills, night sweats, infection issues,  back pain, abdominal pain, or leg swelling.  She denies taking any new medications. Patient has been tolerating Calquence once a day with less body aches and no new or severe toxicities. She denies any abnormal bruising/bleeding, new lumps/bumps, or skin rashes.   Patient generally drinks 64 ounces of water and 1 cup of coffee a day. She is scheduled to receive her age-appropriate vaccines soon.   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HCC)    right axillary    Pneumonia     SURGICAL HISTORY: Past Surgical History:  Procedure Laterality Date   ABDOMINAL HYSTERECTOMY     AXILLARY LYMPH NODE BIOPSY Right 02/07/2016   back injections     BREAST SURGERY     COLONOSCOPY  2009   EYE SURGERY     retinal tear repair   KNEE SURGERY     torn ALC per pt    SOCIAL  HISTORY: Social History   Socioeconomic History   Marital status: Widowed    Spouse name: n/a   Number of children: 6   Years of education: 14   Highest education level: Not on file  Occupational History   Occupation: self-employed    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   Her adult children live nearby.   Social Determinants of Health   Financial Resource Strain: Low Risk  (01/08/2023)   Overall Financial Resource Strain (CARDIA)    Difficulty of Paying Living Expenses: Not hard at all  Food Insecurity: No Food Insecurity (01/08/2023)   Hunger Vital Sign    Worried About Running Out of Food in the Last Year: Never true    Ran Out of Food in the Last Year: Never true  Transportation Needs: No Transportation Needs (01/08/2023)   PRAPARE - Administrator, Civil Service (Medical): No    Lack of Transportation (Non-Medical): No  Physical Activity: Sufficiently Active (01/08/2023)   Exercise Vital Sign  Days of Exercise per Week: 5 days    Minutes of Exercise per Session: 30 min  Stress: No Stress Concern Present (01/08/2023)   [employer] of Occupational Health - Occupational Stress Questionnaire    Feeling of Stress : Not at all  Social Connections: Moderately Isolated (01/08/2023)   Social Connection and Isolation Panel [NHANES]    Frequency of Communication with Friends and Family: More than three times a week    Frequency of Social Gatherings with Friends and Family: Three times a week    Attends Religious Services: More than 4 times per year    Active Member of Clubs or Organizations: No    Attends Tax inspector Meetings: Never    Marital Status: Widowed  Intimate Partner Violence: Not At Risk (01/08/2023)   Humiliation, Afraid, Rape, and Kick questionnaire    Fear of Current or Ex-Partner: No    Emotionally Abused: No    Physically Abused: No    Sexually Abused: No    FAMILY HISTORY: Family History  Problem Relation Age of Onset   Breast cancer Sister    Hyperlipidemia Son    Schizophrenia Mother    Colon cancer Neg Hx    Colon polyps Neg Hx    Esophageal cancer Neg Hx    Rectal cancer Neg Hx    Stomach cancer Neg Hx     ALLERGIES:  is allergic to crestor [rosuvastatin], hctz [hydrochlorothiazide], and lipitor [atorvastatin].  MEDICATIONS:  Current Outpatient Medications  Medication Sig Dispense Refill   acalabrutinib maleate (CALQUENCE) 100 MG tablet Take 1 tablet (100 mg total) by mouth daily. 30 tablet 5   amLODipine (NORVASC) 10 MG tablet TAKE 1 TABLET(10 MG) BY MOUTH DAILY 90 tablet 3   Cholecalciferol (VITAMIN D-3) 5000 units TABS Take 1 tablet by mouth daily.     diphenhydrAMINE (BENADRYL) 25 mg capsule Take 25 mg by mouth every 6 (six) hours as needed for itching or allergies.     lisinopril (ZESTRIL) 10 MG tablet Take 0.5 tablets (5 mg total) by mouth daily. 45 tablet 3   polyethylene glycol (MIRALAX / GLYCOLAX) 17 g packet Take 17 g by mouth daily as needed.     No current facility-administered medications for this visit.    REVIEW OF SYSTEMS:    10 Point review of Systems was done is negative except as noted above.   PHYSICAL EXAMINATION: .BP (!) 172/81   Pulse 63   Temp (!) 97.2 F (36.2 C)   Resp 18   Wt 180 lb 8 oz (81.9 kg)   SpO2 100%   BMI 34.11 kg/m  GENERAL:alert, in no acute distress and comfortable SKIN: no acute rashes, no significant lesions EYES: conjunctiva are pink and non-injected, sclera anicteric OROPHARYNX: MMM, no exudates, no oropharyngeal erythema or ulceration NECK: supple, no JVD LYMPH:  no palpable lymphadenopathy in the  cervical, axillary or inguinal regions LUNGS: clear to auscultation b/l with normal respiratory effort HEART: regular rate & rhythm ABDOMEN:  normoactive bowel sounds , non tender, not distended. Extremity: no pedal edema PSYCH: alert & oriented x 3 with fluent speech NEURO: no focal motor/sensory deficits    LABORATORY DATA:  I have reviewed the data as listed  .    Latest Ref Rng & Units 01/14/2023    9:11 AM 10/15/2022    9:11 AM 10/04/2022    8:11 AM  CBC  WBC 4.0 - 10.5 K/uL 6.2  6.3  4.3   Hemoglobin 12.0 - 15.0  g/dL 16.1  09.6  04.5   Hematocrit 36.0 - 46.0 % 38.3  34.5  35.0   Platelets 150 - 400 K/uL 203  229  235.0     .    Latest Ref Rng & Units 01/14/2023    9:11 AM 10/15/2022    9:11 AM 10/04/2022    8:11 AM  CMP  Glucose 70 - 99 mg/dL 82  93  95   BUN 8 - 23 mg/dL 29  24  19    Creatinine 0.44 - 1.00 mg/dL 4.09  8.11  9.14   Sodium 135 - 145 mmol/L 141  138  141   Potassium 3.5 - 5.1 mmol/L 4.0  3.9  3.8   Chloride 98 - 111 mmol/L 109  105  108   CO2 22 - 32 mmol/L 27  27  26    Calcium 8.9 - 10.3 mg/dL 9.7  78.2  9.4   Total Protein 6.5 - 8.1 g/dL 6.7  7.0  6.0   Total Bilirubin 0.3 - 1.2 mg/dL 0.4  0.4  0.3   Alkaline Phos 38 - 126 U/L 57  57  56   AST 15 - 41 U/L 11  13  22    ALT 0 - 44 U/L 10  16  16     . Lab Results  Component Value Date   LDH 141 01/14/2023     09/22/18 Right Axillary LN Biopsy:    07/31/18 Molecular Pathology:     02/21/16 Biopsy:    RADIOGRAPHIC STUDIES: I have personally reviewed the radiological images as listed and agreed with the findings in the report. No results found.   ASSESSMENT & PLAN:   77 y.o. female with  1. Chronic Lymphocytic Leukemia  02/21/16 Right Axillary LN biopsy revealed SLL, the initial diagnosis. 07/31/18 FISH CLL Prognostic Panel revealed:52.0% cells with 11q deletion Labs upon initial presentation from 09/22/18, WBC at 16.8k, HGB at 10.9, PLT at 272k. ANC at 1.2k, Lymphs at 11.3k, Monocytes  at 4.1k. 09/22/18 Right Axillary LN Biopsy revealed SLL, ruling out a transformation event  08/15/18 PET/CT revealed Bulky lymphadenopathy in the neck, chest, abdomen, and pelvis demonstrating low level hypermetabolism throughout. 9 mm right thyroid nodule is hypermetabolic. Thyroid ultrasound recommended to further evaluate. Mucosal thickening with air-fluid level in the left maxillary sinus. 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  08/12/2019 PET/CT scan ([id number]) revealed disease progression in neck, chest, abdomen, pelvis and interval development of pulmonary lesions  03/23/2020 CT C/A/P ([id number]) ([id number]) revealed. "1. Significant positive response to therapy. No new or progressive disease."  PLAN:  -Discussed lab results on 04/16/23 in detail with patient. CBC stable, showed WBC of 5.8K, hemoglobin of 12.0, and platelets of 228K. -previous mild anemia has resolved -CMP stable, does show signs of mild dehydration. Recommend patient to continue to drink at least 64 ounces of water daily.  -LDH WNL -She has been tolerating Acalabrutinib 100 mg once a day well with no new or severe toxicities -continue Acalabrutinib 100 mg once a day -educated patient that in general, tea/coffee would be dehydrating -recommend patent to stay UTD with age-appropriate vaccinations including COVID-19, flu, and RSV. She reports that she is scheduled for vaccines soon. -will plan to repeat scans in 6-12 months  FOLLOW UP: RTC with Dr Candise Che with labs in 3 months  The total time spent in the appointment was 21 minutes* .  All of the patient's questions were answered with apparent satisfaction. The patient knows to call the clinic  with any problems, questions or concerns.   Wyvonnia Lora MD MS AAHIVMS [facility] Hematology/Oncology Physician [facility]  .*Total Encounter Time as defined by the Centers for Medicare and Medicaid Services includes, in addition to the  face-to-face time of a patient visit (documented in the note above) non-face-to-face time: obtaining and reviewing outside history, ordering and reviewing medications, tests or procedures, care coordination (communications with other health care professionals or caregivers) and documentation in the medical record.    I,Mitra Faeizi,acting as a Neurosurgeon for Wyvonnia Lora, MD.,have documented all relevant documentation on the behalf of Wyvonnia Lora, MD,as directed by  Wyvonnia Lora, MD while in the presence of Wyvonnia Lora, MD. .I have reviewed the above documentation for accuracy and completeness, and I agree with the above.  Johney Maine MD

## 2023-04-17 ENCOUNTER — Telehealth: Payer: Self-pay | Admitting: Hematology

## 2023-04-17 ENCOUNTER — Other Ambulatory Visit: Payer: Self-pay

## 2023-04-17 NOTE — Telephone Encounter (Signed)
Patient is aware of scheduled appointment times/dates

## 2023-04-19 ENCOUNTER — Other Ambulatory Visit (HOSPITAL_COMMUNITY): Payer: Self-pay

## 2023-04-30 ENCOUNTER — Ambulatory Visit (INDEPENDENT_AMBULATORY_CARE_PROVIDER_SITE_OTHER): Payer: Medicare PPO

## 2023-04-30 DIAGNOSIS — Z23 Encounter for immunization: Secondary | ICD-10-CM

## 2023-05-14 ENCOUNTER — Other Ambulatory Visit: Payer: Self-pay

## 2023-05-14 ENCOUNTER — Other Ambulatory Visit (HOSPITAL_COMMUNITY): Payer: Self-pay

## 2023-05-14 NOTE — Progress Notes (Signed)
Specialty Pharmacy Refill Coordination Note  Erica Carlson is a 77 y.o. female contacted today regarding refills of specialty medication(s) Acalabrutinib Maleate   Patient requested Delivery   Delivery date: 05/16/23   Verified address: [address]  [place] [address]   Medication will be filled on 05/15/23.

## 2023-05-14 NOTE — Progress Notes (Signed)
Specialty Pharmacy Ongoing Clinical Assessment Note  Erica Carlson is a 77 y.o. female who is being followed by the specialty pharmacy service for RxSp Oncology   Patient's specialty medication(s) reviewed today: Acalabrutinib Maleate   Missed doses in the last 4 weeks: 0   Patient/Caregiver did not have any additional questions or concerns.   Therapeutic benefit summary: Patient is achieving benefit   Adverse events/side effects summary: No adverse events/side effects   Patient's therapy is appropriate to: Continue    Goals Addressed             This Visit's Progress    Slow Disease Progression       Patient is on track. Patient will maintain adherence         Follow up:  6 months  Otto Herb Specialty Pharmacist

## 2023-05-15 ENCOUNTER — Other Ambulatory Visit: Payer: Self-pay

## 2023-05-22 ENCOUNTER — Encounter: Payer: Self-pay | Admitting: Orthopaedic Surgery

## 2023-05-22 ENCOUNTER — Other Ambulatory Visit (INDEPENDENT_AMBULATORY_CARE_PROVIDER_SITE_OTHER): Payer: Self-pay

## 2023-05-22 ENCOUNTER — Ambulatory Visit: Payer: Medicare PPO | Admitting: Orthopaedic Surgery

## 2023-05-22 DIAGNOSIS — G8929 Other chronic pain: Secondary | ICD-10-CM

## 2023-05-22 DIAGNOSIS — M25561 Pain in right knee: Secondary | ICD-10-CM | POA: Diagnosis not present

## 2023-05-22 MED ORDER — BUPIVACAINE HCL 0.5 % IJ SOLN
2.0000 mL | INTRAMUSCULAR | Status: AC | PRN
Start: 2023-05-22 — End: 2023-05-22
  Administered 2023-05-22: 2 mL via INTRA_ARTICULAR

## 2023-05-22 MED ORDER — LIDOCAINE HCL 1 % IJ SOLN
2.0000 mL | INTRAMUSCULAR | Status: AC | PRN
Start: 2023-05-22 — End: 2023-05-22
  Administered 2023-05-22: 2 mL

## 2023-05-22 MED ORDER — METHYLPREDNISOLONE ACETATE 40 MG/ML IJ SUSP
40.0000 mg | INTRAMUSCULAR | Status: AC | PRN
  Administered 2023-05-22: 40 mg via INTRA_ARTICULAR

## 2023-05-22 NOTE — Progress Notes (Signed)
Office Visit Note   Patient: Erica Carlson           Date of Birth: 04-25-1946           MRN: [id number] Visit Date: 05/22/2023              Requested by: Tower, Audrie Gallus, MD [address] [place],  [place] [address] PCP: Judy Pimple, MD   Assessment & Plan: Visit Diagnoses:  1. Chronic pain of right knee     Plan: Sherlyne is a 77 year old female with worsening right knee osteoarthritis.  X-rays reviewed with her today and they do show progression of DJD.  Treatment options explained and she agreed to try a cortisone injection.  I will also provide her with home exercise program.  Handout for total knee replacement provided.  Follow-up as needed.  Follow-Up Instructions: No follow-ups on file.   Orders:  Orders Placed This Encounter  Procedures   Large Joint Inj   XR KNEE 3 VIEW RIGHT   No orders of the defined types were placed in this encounter.     Procedures: Large Joint Inj: R knee on 05/22/2023 11:07 AM Indications: pain Details: 22 G needle  Arthrogram: No  Medications: 40 mg methylPREDNISolone acetate 40 MG/ML; 2 mL lidocaine 1 %; 2 mL bupivacaine 0.5 % Consent was given by the patient. Patient was prepped and draped in the usual sterile fashion.       Clinical Data: No additional findings.   Subjective: Chief Complaint  Patient presents with   Right Knee - Pain    HPI [name] is a 77 year old female who follows up for management of right knee osteoarthritis.  I saw her approximately 2 years ago and she decided to manage with topical and oral medications.  She feels that her symptoms have gotten much worse specially over the weekend.  She has a lot more medial knee pain.  She has been using ice and topical cream.  Denies any mechanical symptoms. Review of Systems  Constitutional: Negative.   HENT: Negative.    Eyes: Negative.   Respiratory: Negative.    Cardiovascular: Negative.   Endocrine: Negative.   Musculoskeletal: Negative.    Neurological: Negative.   Hematological: Negative.   Psychiatric/Behavioral: Negative.    All other systems reviewed and are negative.    Objective: Vital Signs: There were no vitals taken for this visit.  Physical Exam Vitals and nursing note reviewed.  Constitutional:      Appearance: She is well-developed.  HENT:     Head: Normocephalic and atraumatic.  Pulmonary:     Effort: Pulmonary effort is normal.  Abdominal:     Palpations: Abdomen is soft.  Musculoskeletal:     Cervical back: Neck supple.  Skin:    General: Skin is warm.     Capillary Refill: Capillary refill takes less than 2 seconds.  Neurological:     Mental Status: She is alert and oriented to person, place, and time.  Psychiatric:        Behavior: Behavior normal.        Thought Content: Thought content normal.        Judgment: Judgment normal.     Ortho Exam Exam of the right knee shows no joint effusion.  She has medial joint line tenderness.  Collaterals or cruciates are stable.  Range of motion is at baseline. Specialty Comments:  No specialty comments available.  Imaging: No results found.   PMFS History: Patient  Active Problem List   Diagnosis Date Noted   Statin intolerance 10/12/2022   CKD (chronic kidney disease) stage 3, GFR 30-59 ml/min (HCC) 11/23/2021   Encounter for screening mammogram for breast cancer 10/10/2021   Elevated serum creatinine 10/10/2021   Hand pain 10/04/2020   CLL (chronic lymphocytic leukemia) (HCC) 08/31/2019   Counseling regarding advance care planning and goals of care 08/31/2019   Need for hepatitis C screening test 06/26/2016   Encounter for Medicare annual wellness exam 09/30/2013   Estrogen deficiency 09/17/2012   Herpes zoster without complication 07/09/2011   Special screening for malignant neoplasms, colon 12/15/2010   Routine general medical examination at a health care facility 12/08/2010   OSTEOARTHRITIS, KNEE, RIGHT 10/11/2008   Internal  hemorrhoids 09/02/2008   Hypertension 04/28/2008   PNEUMONIA, HX OF 03/03/2008   Hyperlipidemia 02/26/2008   Past Medical History:  Diagnosis Date   Allergy    spring   Arthritis    osteo arthritis of right knee   Cataract    forming   Hyperlipidemia    Hypertension    Lymphoma, small-cell (HCC)    right axillary    Pneumonia     Family History  Problem Relation Age of Onset   Breast cancer Sister    Hyperlipidemia Son    Schizophrenia Mother    Colon cancer Neg Hx    Colon polyps Neg Hx    Esophageal cancer Neg Hx    Rectal cancer Neg Hx    Stomach cancer Neg Hx     Past Surgical History:  Procedure Laterality Date   ABDOMINAL HYSTERECTOMY     AXILLARY LYMPH NODE BIOPSY Right 02/07/2016   back injections     BREAST SURGERY     COLONOSCOPY  2009   EYE SURGERY     retinal tear repair   KNEE SURGERY     torn [facility] per pt   Social History   Occupational History   Occupation: self-employed    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

## 2023-05-23 DIAGNOSIS — H59811 Chorioretinal scars after surgery for detachment, right eye: Secondary | ICD-10-CM | POA: Diagnosis not present

## 2023-05-23 DIAGNOSIS — H524 Presbyopia: Secondary | ICD-10-CM | POA: Diagnosis not present

## 2023-05-23 DIAGNOSIS — H353131 Nonexudative age-related macular degeneration, bilateral, early dry stage: Secondary | ICD-10-CM | POA: Diagnosis not present

## 2023-05-23 DIAGNOSIS — H52223 Regular astigmatism, bilateral: Secondary | ICD-10-CM | POA: Diagnosis not present

## 2023-05-23 DIAGNOSIS — H35033 Hypertensive retinopathy, bilateral: Secondary | ICD-10-CM | POA: Diagnosis not present

## 2023-05-23 DIAGNOSIS — H33311 Horseshoe tear of retina without detachment, right eye: Secondary | ICD-10-CM | POA: Diagnosis not present

## 2023-05-23 DIAGNOSIS — Z961 Presence of intraocular lens: Secondary | ICD-10-CM | POA: Diagnosis not present

## 2023-05-29 ENCOUNTER — Telehealth: Payer: Self-pay | Admitting: Pharmacy Technician

## 2023-05-29 ENCOUNTER — Other Ambulatory Visit (HOSPITAL_COMMUNITY): Payer: Self-pay

## 2023-05-29 NOTE — Telephone Encounter (Signed)
Telephone call  

## 2023-05-29 NOTE — Telephone Encounter (Signed)
Oral Oncology Patient Advocate Encounter  Was successful in securing patient a $8,000 grant from [facility] to provide copayment coverage for Calquence.  This will keep the out of pocket expense at $0.     [employer] ID: [id number]  I have spoken with the patient.   The billing information is as follows and has been shared with [facility].    RxBin: [id number] PCN: [id number] Member ID: [id number] Group ID: [id number] Dates of Eligibility: 05/30/23 through 05/28/24  Fund:  CLL  Jinger Neighbors, CPhT-Adv Oncology Pharmacy Patient Advocate [facility] Direct Number: [phone]  Fax: [phone]

## 2023-05-31 DIAGNOSIS — S138XXA Sprain of joints and ligaments of other parts of neck, initial encounter: Secondary | ICD-10-CM | POA: Diagnosis not present

## 2023-05-31 DIAGNOSIS — M9903 Segmental and somatic dysfunction of lumbar region: Secondary | ICD-10-CM | POA: Diagnosis not present

## 2023-05-31 DIAGNOSIS — M9901 Segmental and somatic dysfunction of cervical region: Secondary | ICD-10-CM | POA: Diagnosis not present

## 2023-05-31 DIAGNOSIS — M5137 Other intervertebral disc degeneration, lumbosacral region with discogenic back pain only: Secondary | ICD-10-CM | POA: Diagnosis not present

## 2023-05-31 DIAGNOSIS — M5136 Other intervertebral disc degeneration, lumbar region with discogenic back pain only: Secondary | ICD-10-CM | POA: Diagnosis not present

## 2023-05-31 DIAGNOSIS — M47814 Spondylosis without myelopathy or radiculopathy, thoracic region: Secondary | ICD-10-CM | POA: Diagnosis not present

## 2023-05-31 DIAGNOSIS — M9905 Segmental and somatic dysfunction of pelvic region: Secondary | ICD-10-CM | POA: Diagnosis not present

## 2023-05-31 DIAGNOSIS — M9902 Segmental and somatic dysfunction of thoracic region: Secondary | ICD-10-CM | POA: Diagnosis not present

## 2023-06-07 ENCOUNTER — Other Ambulatory Visit: Payer: Self-pay

## 2023-06-07 ENCOUNTER — Other Ambulatory Visit (HOSPITAL_COMMUNITY): Payer: Self-pay

## 2023-06-07 NOTE — Progress Notes (Signed)
Specialty Pharmacy Refill Coordination Note  Erica Carlson is a 77 y.o. female contacted today regarding refills of specialty medication(s) Acalabrutinib Maleate   Patient requested Delivery   Delivery date: 06/14/23   Verified address: [address]  [place] [address]   Medication will be filled on 06/13/23.

## 2023-06-13 ENCOUNTER — Other Ambulatory Visit: Payer: Self-pay

## 2023-07-02 ENCOUNTER — Other Ambulatory Visit (HOSPITAL_COMMUNITY): Payer: Self-pay

## 2023-07-02 ENCOUNTER — Other Ambulatory Visit: Payer: Self-pay

## 2023-07-02 NOTE — Progress Notes (Signed)
Specialty Pharmacy Refill Coordination Note  Erica Carlson is a 77 y.o. female contacted today regarding refills of specialty medication(s) Acalabrutinib Maleate (Calquence)   Patient requested Delivery   Delivery date: 07/11/23   Verified address: [address]  [place] [address]   Medication will be filled on 07/09/23.

## 2023-07-05 DIAGNOSIS — M9901 Segmental and somatic dysfunction of cervical region: Secondary | ICD-10-CM | POA: Diagnosis not present

## 2023-07-05 DIAGNOSIS — M9903 Segmental and somatic dysfunction of lumbar region: Secondary | ICD-10-CM | POA: Diagnosis not present

## 2023-07-05 DIAGNOSIS — S138XXA Sprain of joints and ligaments of other parts of neck, initial encounter: Secondary | ICD-10-CM | POA: Diagnosis not present

## 2023-07-05 DIAGNOSIS — M51362 Other intervertebral disc degeneration, lumbar region with discogenic back pain and lower extremity pain: Secondary | ICD-10-CM | POA: Diagnosis not present

## 2023-07-05 DIAGNOSIS — M47814 Spondylosis without myelopathy or radiculopathy, thoracic region: Secondary | ICD-10-CM | POA: Diagnosis not present

## 2023-07-05 DIAGNOSIS — M9905 Segmental and somatic dysfunction of pelvic region: Secondary | ICD-10-CM | POA: Diagnosis not present

## 2023-07-05 DIAGNOSIS — M51372 Other intervertebral disc degeneration, lumbosacral region with discogenic back pain and lower extremity pain: Secondary | ICD-10-CM | POA: Diagnosis not present

## 2023-07-05 DIAGNOSIS — M9902 Segmental and somatic dysfunction of thoracic region: Secondary | ICD-10-CM | POA: Diagnosis not present

## 2023-07-09 ENCOUNTER — Other Ambulatory Visit: Payer: Self-pay

## 2023-07-18 ENCOUNTER — Other Ambulatory Visit: Payer: Self-pay

## 2023-07-18 DIAGNOSIS — R1900 Intra-abdominal and pelvic swelling, mass and lump, unspecified site: Secondary | ICD-10-CM

## 2023-07-18 DIAGNOSIS — C9110 Chronic lymphocytic leukemia of B-cell type not having achieved remission: Secondary | ICD-10-CM

## 2023-07-18 NOTE — Progress Notes (Signed)
 HEMATOLOGY/ONCOLOGY CLINIC NOTE  Date of Service: 07/19/2023  Patient Care Team: Tower, Laine LABOR, MD as PCP - General (Family Medicine) Rachell Zachary Lot, OD as Consulting Physician (Optometry) Onesimo, Emaline Brink, MD as Consulting Physician (Hematology)  CHIEF COMPLAINTS/PURPOSE OF CONSULTATION:  Follow-up for continued evaluation and management of CLL  HISTORY OF PRESENTING ILLNESS:  Please see previous notes for details on initial presentation.  INTERVAL HISTORY:   Erica Carlson is a 78 y.o. 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  Patient was last seen by me on 04/16/2023 and reported arthritis in her knees, mild body aches, and other age-related symptoms.   Today, she reports that there are considerations for knee replacement due to mild intermittent pain possibly in the springtime. She reports that she did previously receive steroid shots which did not significantly improve pain. Patient has not previously received gel injections.   She reports that her walking has been causing sciatic nerve issue.   She reports plans for weight loss. Her weight in clinic today is 178 pounds.   She is tolerating Claquence well with no major toxicity issues. Patient denies any bleeding issues, new fatigue, fever, chills, night sweats, or abdominal pain. She denies starting any new medications.   She reports endorsing a recurrent pressure wound on her back occurring every 2 years in the same area. She sterilizes the area with medication, which typically resolves the wound after a week. Her pressure wound is improving at this time. She attributes her wound to sitting in a certain position for extended periods.   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HCC)    right axillary     Pneumonia     SURGICAL HISTORY: Past Surgical History:  Procedure Laterality Date   ABDOMINAL HYSTERECTOMY     AXILLARY LYMPH NODE BIOPSY Right 02/07/2016   back injections     BREAST SURGERY     COLONOSCOPY  2009   EYE SURGERY     retinal tear repair   KNEE SURGERY     torn ALC per pt    SOCIAL HISTORY: Social History   Socioeconomic History   Marital status: Widowed    Spouse name: n/a   Number of children: 6   Years of education: 14   Highest education level: Not on file  Occupational History   Occupation: self-employed    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   Her adult children live nearby.   Social Drivers of Corporate Investment Banker Strain: Low Risk  (01/08/2023)   Overall Financial Resource Strain (CARDIA)    Difficulty of Paying Living Expenses: Not hard at all  Food Insecurity: No Food Insecurity (01/08/2023)   Hunger Vital Sign    Worried About Running Out of Food in the Last Year: Never true  Ran Out of Food in the Last Year: Never true  Transportation Needs: No Transportation Needs (01/08/2023)   PRAPARE - Administrator, Civil Service (Medical): No    Lack of Transportation (Non-Medical): No  Physical Activity: Sufficiently Active (01/08/2023)   Exercise Vital Sign    Days of Exercise per Week: 5 days    Minutes of Exercise per Session: 30 min  Stress: No Stress Concern Present (01/08/2023)   [employer] of Occupational Health - Occupational Stress Questionnaire    Feeling of Stress : Not at all  Social Connections: Moderately Isolated (01/08/2023)   Social Connection and Isolation  Panel [NHANES]    Frequency of Communication with Friends and Family: More than three times a week    Frequency of Social Gatherings with Friends and Family: Three times a week    Attends Religious Services: More than 4 times per year    Active Member of Clubs or Organizations: No    Attends Banker Meetings: Never    Marital Status: Widowed  Intimate Partner Violence: Not At Risk (01/08/2023)   Humiliation, Afraid, Rape, and Kick questionnaire    Fear of Current or Ex-Partner: No    Emotionally Abused: No    Physically Abused: No    Sexually Abused: No    FAMILY HISTORY: Family History  Problem Relation Age of Onset   Breast cancer Sister    Hyperlipidemia Son    Schizophrenia Mother    Colon cancer Neg Hx    Colon polyps Neg Hx    Esophageal cancer Neg Hx    Rectal cancer Neg Hx    Stomach cancer Neg Hx     ALLERGIES:  is allergic to crestor  [rosuvastatin ], hctz [hydrochlorothiazide ], and lipitor [atorvastatin ].  MEDICATIONS:  Current Outpatient Medications  Medication Sig Dispense Refill   acalabrutinib  maleate (CALQUENCE ) 100 MG tablet Take 1 tablet (100 mg total) by mouth daily. 30 tablet 5   amLODipine  (NORVASC ) 10 MG tablet TAKE 1 TABLET(10 MG) BY MOUTH DAILY 90 tablet 3   Cholecalciferol (VITAMIN D -3) 5000 units TABS Take 1 tablet by mouth daily.     diphenhydrAMINE  (BENADRYL ) 25 mg capsule Take 25 mg by mouth every 6 (six) hours as needed for itching or allergies.     lisinopril  (ZESTRIL ) 10 MG tablet Take 0.5 tablets (5 mg total) by mouth daily. 45 tablet 3   polyethylene glycol (MIRALAX / GLYCOLAX) 17 g packet Take 17 g by mouth daily as needed.     No current facility-administered medications for this visit.    REVIEW OF SYSTEMS:    10 Point review of Systems was done is negative except as noted above.   PHYSICAL EXAMINATION: .BP (!) 143/66 (BP Location: Left Arm, Patient Position: Sitting)   Pulse 64   Temp 97.7 F (36.5 C) (Temporal)   Resp  14   Ht 5' 1 (1.549 m)   Wt 178 lb 6.4 oz (80.9 kg)   SpO2 100%   BMI 33.71 kg/m  GENERAL:alert, in no acute distress and comfortable SKIN: no acute rashes, no significant lesions EYES: conjunctiva are pink and non-injected, sclera anicteric OROPHARYNX: MMM, no exudates, no oropharyngeal erythema or ulceration NECK: supple, no JVD LYMPH:  no palpable lymphadenopathy in the cervical, axillary or inguinal regions LUNGS: clear to auscultation b/l with normal respiratory effort HEART: regular rate & rhythm ABDOMEN:  normoactive bowel sounds , non tender, not distended. Extremity: no pedal edema PSYCH: alert & oriented x 3 with fluent  speech NEURO: no focal motor/sensory deficits    LABORATORY DATA:  I have reviewed the data as listed  .    Latest Ref Rng & Units 07/19/2023   11:43 AM 04/16/2023    9:54 AM 01/14/2023    9:11 AM  CBC  WBC 4.0 - 10.5 K/uL 6.0  5.8  6.2   Hemoglobin 12.0 - 15.0 g/dL 87.7  87.9  88.3   Hematocrit 36.0 - 46.0 % 38.3  39.4  38.3   Platelets 150 - 400 K/uL 242  228  203     .    Latest Ref Rng & Units 07/19/2023   11:43 AM 04/16/2023    9:54 AM 01/14/2023    9:11 AM  CMP  Glucose 70 - 99 mg/dL 84  894  82   BUN 8 - 23 mg/dL 28  26  29    Creatinine 0.44 - 1.00 mg/dL 8.55  8.74  8.67   Sodium 135 - 145 mmol/L 138  138  141   Potassium 3.5 - 5.1 mmol/L 4.4  4.1  4.0   Chloride 98 - 111 mmol/L 106  106  109   CO2 22 - 32 mmol/L 27  26  27    Calcium  8.9 - 10.3 mg/dL 9.9  9.8  9.7   Total Protein 6.5 - 8.1 g/dL 6.9  6.6  6.7   Total Bilirubin 0.0 - 1.2 mg/dL 0.5  0.3  0.4   Alkaline Phos 38 - 126 U/L 70  66  57   AST 15 - 41 U/L 15  11  11    ALT 0 - 44 U/L 11  10  10     . Lab Results  Component Value Date   LDH 150 07/19/2023     09/22/18 Right Axillary LN Biopsy:    07/31/18 Molecular Pathology:     02/21/16 Biopsy:    RADIOGRAPHIC STUDIES: I have personally reviewed the radiological images as listed and agreed with the findings in  the report. No results found.   ASSESSMENT & PLAN:   78 y.o. female with  1. Chronic Lymphocytic Leukemia  02/21/16 Right Axillary LN biopsy revealed SLL, the initial diagnosis. 07/31/18 FISH CLL Prognostic Panel revealed:52.0% cells with 11q deletion Labs upon initial presentation from 09/22/18, WBC at 16.8k, HGB at 10.9, PLT at 272k. ANC at 1.2k, Lymphs at 11.3k, Monocytes at 4.1k. 09/22/18 Right Axillary LN Biopsy revealed SLL, ruling out a transformation event  08/15/18 PET/CT revealed Bulky lymphadenopathy in the neck, chest, abdomen, and pelvis demonstrating low level hypermetabolism throughout. 9 mm right thyroid  nodule is hypermetabolic. Thyroid  ultrasound recommended to further evaluate. Mucosal thickening with air-fluid level in the left maxillary sinus. 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  08/12/2019 PET/CT scan ([id number]) revealed disease progression in neck, chest, abdomen, pelvis and interval development of pulmonary lesions  03/23/2020 CT C/A/P ([id number]) ([id number]) revealed. 1. Significant positive response to therapy. No new or progressive disease.  PLAN:  -Discussed lab results on 07/19/2023 in detail with patient. CBC normal, showed WBC of 6.0K, hemoglobin of 12.2, and platelets of 242K. -cmp with CKD LDH WNL -She has been tolerating Acalabrutinib  100 mg once a day well with no new or severe toxicities -continue Acalabrutinib  100 mg once a day -recommend patient to keep pressure off the area of her pressure wound and try using a gel pillow to evenly distribute weight -would not recommend knee surgery at this time if her symptoms are not significantly bothersome and is not an  emergent situation -recommend patient to engage in low impact activity such as cycling or swimming to strengthen the muscles around the joint which may delay the need for surgery -advised patient to let us  know if there are any plans for surgery as there may be a role to  hold certain medications -Patient reports that she is planning for weight loss. Recommend healthy diet and physical activity. -will plan to repeat CT scan later this year -patient shall return to clinic in 3-4 months   FOLLOW UP: RTC with Dr Onesimo with labs in 3 months  The total time spent in the appointment was 20 minutes* .  All of the patient's questions were answered with apparent satisfaction. The patient knows to call the clinic with any problems, questions or concerns.   Emaline Onesimo MD MS AAHIVMS [facility] Hematology/Oncology Physician [facility]  .*Total Encounter Time as defined by the Centers for Medicare and Medicaid Services includes, in addition to the face-to-face time of a patient visit (documented in the note above) non-face-to-face time: obtaining and reviewing outside history, ordering and reviewing medications, tests or procedures, care coordination (communications with other health care professionals or caregivers) and documentation in the medical record.    I,Mitra Faeizi,acting as a neurosurgeon for Emaline Onesimo, MD.,have documented all relevant documentation on the behalf of Emaline Onesimo, MD,as directed by  Emaline Onesimo, MD while in the presence of Emaline Onesimo, MD.  .I have reviewed the above documentation for accuracy and completeness, and I agree with the above. .Tahja Liao Kishore Evellyn Tuff MD

## 2023-07-19 ENCOUNTER — Inpatient Hospital Stay: Payer: Medicare PPO | Attending: Hematology

## 2023-07-19 ENCOUNTER — Inpatient Hospital Stay (HOSPITAL_BASED_OUTPATIENT_CLINIC_OR_DEPARTMENT_OTHER): Payer: Medicare PPO | Admitting: Hematology

## 2023-07-19 VITALS — BP 143/66 | HR 64 | Temp 97.7°F | Resp 14 | Ht 61.0 in | Wt 178.4 lb

## 2023-07-19 DIAGNOSIS — C9110 Chronic lymphocytic leukemia of B-cell type not having achieved remission: Secondary | ICD-10-CM

## 2023-07-19 DIAGNOSIS — Z803 Family history of malignant neoplasm of breast: Secondary | ICD-10-CM | POA: Diagnosis not present

## 2023-07-19 DIAGNOSIS — Z79899 Other long term (current) drug therapy: Secondary | ICD-10-CM | POA: Diagnosis not present

## 2023-07-19 DIAGNOSIS — L8990 Pressure ulcer of unspecified site, unspecified stage: Secondary | ICD-10-CM | POA: Insufficient documentation

## 2023-07-19 DIAGNOSIS — R1900 Intra-abdominal and pelvic swelling, mass and lump, unspecified site: Secondary | ICD-10-CM

## 2023-07-19 LAB — CBC WITH DIFFERENTIAL (CANCER CENTER ONLY)
Abs Immature Granulocytes: 0.01 10*3/uL (ref 0.00–0.07)
Basophils Absolute: 0 10*3/uL (ref 0.0–0.1)
Basophils Relative: 0 %
Eosinophils Absolute: 0.2 10*3/uL (ref 0.0–0.5)
Eosinophils Relative: 3 %
HCT: 38.3 % (ref 36.0–46.0)
Hemoglobin: 12.2 g/dL (ref 12.0–15.0)
Immature Granulocytes: 0 %
Lymphocytes Relative: 48 %
Lymphs Abs: 2.8 10*3/uL (ref 0.7–4.0)
MCH: 26.8 pg (ref 26.0–34.0)
MCHC: 31.9 g/dL (ref 30.0–36.0)
MCV: 84 fL (ref 80.0–100.0)
Monocytes Absolute: 0.5 10*3/uL (ref 0.1–1.0)
Monocytes Relative: 8 %
Neutro Abs: 2.4 10*3/uL (ref 1.7–7.7)
Neutrophils Relative %: 41 %
Platelet Count: 242 10*3/uL (ref 150–400)
RBC: 4.56 MIL/uL (ref 3.87–5.11)
RDW: 13.7 % (ref 11.5–15.5)
WBC Count: 6 10*3/uL (ref 4.0–10.5)
nRBC: 0 % (ref 0.0–0.2)

## 2023-07-19 LAB — CMP (CANCER CENTER ONLY)
ALT: 11 U/L (ref 0–44)
AST: 15 U/L (ref 15–41)
Albumin: 4.2 g/dL (ref 3.5–5.0)
Alkaline Phosphatase: 70 U/L (ref 38–126)
Anion gap: 5 (ref 5–15)
BUN: 28 mg/dL — ABNORMAL HIGH (ref 8–23)
CO2: 27 mmol/L (ref 22–32)
Calcium: 9.9 mg/dL (ref 8.9–10.3)
Chloride: 106 mmol/L (ref 98–111)
Creatinine: 1.44 mg/dL — ABNORMAL HIGH (ref 0.44–1.00)
GFR, Estimated: 38 mL/min — ABNORMAL LOW (ref >60)
Glucose, Bld: 84 mg/dL (ref 70–99)
Potassium: 4.4 mmol/L (ref 3.5–5.1)
Sodium: 138 mmol/L (ref 135–145)
Total Bilirubin: 0.5 mg/dL (ref 0.0–1.2)
Total Protein: 6.9 g/dL (ref 6.5–8.1)

## 2023-07-19 LAB — LACTATE DEHYDROGENASE: LDH: 150 U/L (ref 98–192)

## 2023-07-31 ENCOUNTER — Other Ambulatory Visit: Payer: Self-pay

## 2023-08-02 ENCOUNTER — Other Ambulatory Visit: Payer: Self-pay

## 2023-08-05 ENCOUNTER — Other Ambulatory Visit (HOSPITAL_COMMUNITY): Payer: Self-pay

## 2023-08-07 ENCOUNTER — Other Ambulatory Visit: Payer: Self-pay

## 2023-08-07 ENCOUNTER — Other Ambulatory Visit (HOSPITAL_COMMUNITY): Payer: Self-pay

## 2023-08-07 NOTE — Progress Notes (Signed)
Specialty Pharmacy Ongoing Clinical Assessment Note  Erica Carlson is a 78 y.o. female who is being followed by the specialty pharmacy service for RxSp Oncology   Patient's specialty medication(s) reviewed today: Acalabrutinib Maleate (Calquence)   Missed doses in the last 4 weeks: 0   Patient/Caregiver did not have any additional questions or concerns.   Therapeutic benefit summary: Unable to assess   Adverse events/side effects summary: No adverse events/side effects   Patient's therapy is appropriate to: Continue    Goals Addressed             This Visit's Progress    Stabilization of disease       Patient is on track. Patient will maintain adherence         Follow up:  6 months  [name] Specialty Pharmacist

## 2023-08-07 NOTE — Progress Notes (Signed)
Specialty Pharmacy Refill Coordination Note  Erica Carlson is a 78 y.o. female contacted today regarding refills of specialty medication(s) Acalabrutinib Maleate (Calquence)   Patient requested Delivery   Delivery date: 08/15/23   Verified address: [address]  [place] [address]   Medication will be filled on 08/14/23.

## 2023-08-14 ENCOUNTER — Other Ambulatory Visit: Payer: Self-pay

## 2023-09-02 ENCOUNTER — Other Ambulatory Visit: Payer: Self-pay

## 2023-09-05 ENCOUNTER — Other Ambulatory Visit: Payer: Self-pay

## 2023-09-09 ENCOUNTER — Other Ambulatory Visit: Payer: Self-pay

## 2023-09-09 NOTE — Progress Notes (Signed)
 Specialty Pharmacy Refill Coordination Note  Erica Carlson is a 78 y.o. female contacted today regarding refills of specialty medication(s) Acalabrutinib Maleate (Calquence)   Patient requested Delivery   Delivery date: 09/16/23   Verified address: [address]   [place] [address]   Medication will be filled on 09/13/23.

## 2023-09-12 ENCOUNTER — Other Ambulatory Visit: Payer: Self-pay

## 2023-09-13 ENCOUNTER — Other Ambulatory Visit: Payer: Self-pay

## 2023-10-06 ENCOUNTER — Telehealth: Payer: Self-pay | Admitting: Family Medicine

## 2023-10-06 DIAGNOSIS — Z131 Encounter for screening for diabetes mellitus: Secondary | ICD-10-CM | POA: Insufficient documentation

## 2023-10-06 DIAGNOSIS — E669 Obesity, unspecified: Secondary | ICD-10-CM

## 2023-10-06 DIAGNOSIS — E782 Mixed hyperlipidemia: Secondary | ICD-10-CM

## 2023-10-06 DIAGNOSIS — I10 Essential (primary) hypertension: Secondary | ICD-10-CM

## 2023-10-06 DIAGNOSIS — R7303 Prediabetes: Secondary | ICD-10-CM | POA: Insufficient documentation

## 2023-10-06 NOTE — Telephone Encounter (Signed)
-----   Message from Alvina Chou sent at 09/20/2023 10:00 AM EDT ----- Regarding: Lab orders for Tue, 4.1.25 Patient is scheduled for CPX labs, please order future labs, Thanks , Camelia Eng

## 2023-10-08 ENCOUNTER — Other Ambulatory Visit (INDEPENDENT_AMBULATORY_CARE_PROVIDER_SITE_OTHER): Payer: Medicare PPO

## 2023-10-08 DIAGNOSIS — Z131 Encounter for screening for diabetes mellitus: Secondary | ICD-10-CM

## 2023-10-08 DIAGNOSIS — E669 Obesity, unspecified: Secondary | ICD-10-CM

## 2023-10-08 DIAGNOSIS — I10 Essential (primary) hypertension: Secondary | ICD-10-CM

## 2023-10-08 DIAGNOSIS — E782 Mixed hyperlipidemia: Secondary | ICD-10-CM | POA: Diagnosis not present

## 2023-10-08 LAB — CBC WITH DIFFERENTIAL/PLATELET
Basophils Absolute: 0 10*3/uL (ref 0.0–0.1)
Basophils Relative: 0.3 % (ref 0.0–3.0)
Eosinophils Absolute: 0.3 10*3/uL (ref 0.0–0.7)
Eosinophils Relative: 4.7 % (ref 0.0–5.0)
HCT: 37.8 % (ref 36.0–46.0)
Hemoglobin: 11.9 g/dL — ABNORMAL LOW (ref 12.0–15.0)
Lymphocytes Relative: 49.7 % — ABNORMAL HIGH (ref 12.0–46.0)
Lymphs Abs: 3 10*3/uL (ref 0.7–4.0)
MCHC: 31.5 g/dL (ref 30.0–36.0)
MCV: 85.7 fl (ref 78.0–100.0)
Monocytes Absolute: 0.4 10*3/uL (ref 0.1–1.0)
Monocytes Relative: 7.2 % (ref 3.0–12.0)
Neutro Abs: 2.3 10*3/uL (ref 1.4–7.7)
Neutrophils Relative %: 38.1 % — ABNORMAL LOW (ref 43.0–77.0)
Platelets: 249 10*3/uL (ref 150.0–400.0)
RBC: 4.41 Mil/uL (ref 3.87–5.11)
RDW: 13.9 % (ref 11.5–15.5)
WBC: 6 10*3/uL (ref 4.0–10.5)

## 2023-10-08 LAB — COMPREHENSIVE METABOLIC PANEL WITH GFR
ALT: 13 U/L (ref 0–35)
AST: 12 U/L (ref 0–37)
Albumin: 4.2 g/dL (ref 3.5–5.2)
Alkaline Phosphatase: 70 U/L (ref 39–117)
BUN: 29 mg/dL — ABNORMAL HIGH (ref 6–23)
CO2: 27 meq/L (ref 19–32)
Calcium: 9.8 mg/dL (ref 8.4–10.5)
Chloride: 108 meq/L (ref 96–112)
Creatinine, Ser: 1.28 mg/dL — ABNORMAL HIGH (ref 0.40–1.20)
GFR: 40.55 mL/min — ABNORMAL LOW (ref >60.00)
Glucose, Bld: 94 mg/dL (ref 70–99)
Potassium: 4.1 meq/L (ref 3.5–5.1)
Sodium: 142 meq/L (ref 135–145)
Total Bilirubin: 0.3 mg/dL (ref 0.2–1.2)
Total Protein: 6.4 g/dL (ref 6.0–8.3)

## 2023-10-08 LAB — HEMOGLOBIN A1C: Hgb A1c MFr Bld: 5.8 % (ref 4.6–6.5)

## 2023-10-08 LAB — LIPID PANEL
Cholesterol: 320 mg/dL — ABNORMAL HIGH (ref 0–200)
HDL: 57 mg/dL (ref >39.00)
LDL Cholesterol: 231 mg/dL — ABNORMAL HIGH (ref 0–99)
NonHDL: 262.79
Total CHOL/HDL Ratio: 6
Triglycerides: 159 mg/dL — ABNORMAL HIGH (ref 0.0–149.0)
VLDL: 31.8 mg/dL (ref 0.0–40.0)

## 2023-10-08 LAB — TSH: TSH: 2.77 u[IU]/mL (ref 0.35–5.50)

## 2023-10-09 ENCOUNTER — Other Ambulatory Visit: Payer: Self-pay

## 2023-10-10 ENCOUNTER — Ambulatory Visit: Payer: Medicare PPO

## 2023-10-10 VITALS — Ht 61.0 in | Wt 174.0 lb

## 2023-10-10 DIAGNOSIS — Z0000 Encounter for general adult medical examination without abnormal findings: Secondary | ICD-10-CM

## 2023-10-10 NOTE — Progress Notes (Signed)
 Subjective:   Erica Carlson is a 78 y.o. who presents for a Medicare Wellness preventive visit.  Visit Complete: Virtual I connected with  Erica Carlson on 10/10/23 by a audio enabled telemedicine application and verified that I am speaking with the correct person using two identifiers.  Patient Location: Home  Provider Location: Home Office  I discussed the limitations of evaluation and management by telemedicine. The patient expressed understanding and agreed to proceed.  Vital Signs: Because this visit was a virtual/telehealth visit, some criteria may be missing or patient reported. Any vitals not documented were not able to be obtained and vitals that have been documented are patient reported.  VideoDeclined- This patient declined Librarian, academic. Therefore the visit was completed with audio only.  Persons Participating in Visit: Patient.  AWV Questionnaire: No: Patient Medicare AWV questionnaire was not completed prior to this visit.  Cardiac Risk Factors include: advanced age (>63men, >78 women);dyslipidemia;hypertension;obesity (BMI >30kg/m2)     Objective:    Today's Vitals   10/10/23 1545 10/10/23 1546  Weight: 174 lb (78.9 kg)   Height: 5\' 1"  (1.549 m)   PainSc:  2    Body mass index is 32.88 kg/m.     10/10/2023    3:59 PM 01/08/2023    2:31 PM 09/27/2020    2:48 PM 01/28/2020    9:30 AM 12/17/2019    9:19 AM 11/05/2019    9:29 AM 09/14/2019   11:01 AM  Advanced Directives  Does Patient Have a Medical Advance Directive? Yes No Yes No No No No  Type of Advance Directive Living will;Healthcare Power of [employer] Power of [place];Living will      Copy of Healthcare Power of Attorney in Chart? No - copy requested  No - copy requested      Would patient like information on creating a medical advance directive?  Yes (MAU/Ambulatory/Procedural Areas - Information given)  No - Patient declined No - Patient declined No -  Patient declined No - Patient declined    Current Medications (verified) Outpatient Encounter Medications as of 10/10/2023  Medication Sig   acalabrutinib maleate (CALQUENCE) 100 MG tablet Take 1 tablet (100 mg total) by mouth daily.   amLODipine (NORVASC) 10 MG tablet TAKE 1 TABLET(10 MG) BY MOUTH DAILY   Cholecalciferol (VITAMIN D-3) 5000 units TABS Take 1 tablet by mouth daily.   diphenhydrAMINE (BENADRYL) 25 mg capsule Take 25 mg by mouth every 6 (six) hours as needed for itching or allergies.   lisinopril (ZESTRIL) 10 MG tablet Take 0.5 tablets (5 mg total) by mouth daily.   polyethylene glycol (MIRALAX / GLYCOLAX) 17 g packet Take 17 g by mouth daily as needed.   No facility-administered encounter medications on file as of 10/10/2023.    Allergies (verified) Crestor [rosuvastatin], Hctz [hydrochlorothiazide], and Lipitor [atorvastatin]   History: Past Medical History:  Diagnosis Date   Allergy    spring   Arthritis    osteo arthritis of right knee   Cataract    forming   Hyperlipidemia    Hypertension    Lymphoma, small-cell (HCC)    right axillary    Pneumonia    Past Surgical History:  Procedure Laterality Date   ABDOMINAL HYSTERECTOMY     AXILLARY LYMPH NODE BIOPSY Right 02/07/2016   back injections     BREAST SURGERY     COLONOSCOPY  2009   EYE SURGERY     retinal tear repair  KNEE SURGERY     torn ALC per pt   Family History  Problem Relation Age of Onset   Breast cancer Sister    Hyperlipidemia Son    Schizophrenia Mother    Colon cancer Neg Hx    Colon polyps Neg Hx    Esophageal cancer Neg Hx    Rectal cancer Neg Hx    Stomach cancer Neg Hx    Social History   Socioeconomic History   Marital status: Widowed    Spouse name: n/a   Number of children: 6   Years of education: 14   Highest education level: Not on file  Occupational History   Occupation: self-employed    Employer: [employer]    Comment: alterations and design  Tobacco  Use   Smoking status: Never   Smokeless tobacco: Never  Vaping Use   Vaping status: Never Used  Substance and Sexual Activity   Alcohol use: No    Alcohol/week: 0.0 standard drinks of alcohol   Drug use: No   Sexual activity: Never  Other Topics Concern   Not on file  Social History Narrative   Lives alone.   Her adult children live nearby.   Social Drivers of Corporate investment banker Strain: Low Risk  (10/10/2023)   Overall Financial Resource Strain (CARDIA)    Difficulty of Paying Living Expenses: Not hard at all  Food Insecurity: No Food Insecurity (10/10/2023)   Hunger Vital Sign    Worried About Running Out of Food in the Last Year: Never true    Ran Out of Food in the Last Year: Never true  Transportation Needs: No Transportation Needs (10/10/2023)   PRAPARE - Administrator, Civil Service (Medical): No    Lack of Transportation (Non-Medical): No  Physical Activity: Insufficiently Active (10/10/2023)   Exercise Vital Sign    Days of Exercise per Week: 2 days    Minutes of Exercise per Session: 60 min  Stress: No Stress Concern Present (10/10/2023)   [employer] of Occupational Health - Occupational Stress Questionnaire    Feeling of Stress : Not at all  Social Connections: Moderately Integrated (10/10/2023)   Social Connection and Isolation Panel [NHANES]    Frequency of Communication with Friends and Family: More than three times a week    Frequency of Social Gatherings with Friends and Family: Three times a week    Attends Religious Services: More than 4 times per year    Active Member of Clubs or Organizations: No    Attends Banker Meetings: Never    Marital Status: Married    Tobacco Counseling Counseling given: Not Answered    Clinical Intake:  Pre-visit preparation completed: Yes  Pain : 0-10 Pain Score: 2  Pain Type: Chronic pain Pain Location: Knee Pain Orientation: Right Pain Descriptors / Indicators: Aching,  Nagging Pain Onset: More than a month ago Pain Frequency: Intermittent Pain Relieving Factors: rest, exercises and weight training  Pain Relieving Factors: rest, exercises and weight training  BMI - recorded: 32.88 Nutritional Status: BMI > 30  Obese Nutritional Risks: None Diabetes: No  Lab Results  Component Value Date   HGBA1C 5.8 10/08/2023     How often do you need to have someone help you when you read instructions, pamphlets, or other written materials from your doctor or pharmacy?: 1 - Never  Interpreter Needed?: No  Comments: others live with pt;just got married Information entered by :: B.Suhani Stillion,LPN  Activities of Daily Living     10/10/2023    3:59 PM 01/08/2023    2:31 PM  In your present state of health, do you have any difficulty performing the following activities:  Hearing? 0 0  Vision? 0 0  Difficulty concentrating or making decisions? 0 0  Walking or climbing stairs? 0 0  Dressing or bathing? 0 0  Doing errands, shopping? 0 0  Preparing Food and eating ? N N  Using the Toilet? N N  In the past six months, have you accidently leaked urine? N N  Do you have problems with loss of bowel control? N N  Managing your Medications? N N  Managing your Finances? N N  Housekeeping or managing your Housekeeping?  N    Patient Care Team: Tower, Audrie Gallus, MD as PCP - General (Family Medicine) Liberty Handy, OD as Consulting Physician (Optometry) Johney Maine, MD as Consulting Physician (Hematology)  Indicate any recent Medical Services you may have received from other than [facility] providers in the past year (date may be approximate).     Assessment:   This is a routine wellness examination for March.  Hearing/Vision screen Hearing Screening - Comments:: Pt says her hearing is good Vision Screening - Comments:: Pt says her vision is ok (has aging eyes);cataracts removed;glasses sometimes   Goals Addressed             This Visit's  Progress    Increase physical activity   On track    10/10/23-, I will continue to exercise at least 60 min 3-4 days per week.      Patient Stated   On track    10/10/2023, I will maintain and continue medications as prescribed.      Stabilization of disease   On track    10/10/23* is on track. Patient will maintain adherence        Depression Screen     10/10/2023    3:54 PM 01/08/2023    2:27 PM 10/12/2022    9:24 AM 10/10/2021    3:02 PM 09/27/2020    2:50 PM 07/22/2019    9:41 AM 07/14/2018   10:17 AM  PHQ 2/9 Scores  PHQ - 2 Score 0 0 0 0 0 0 0  PHQ- 9 Score     0      Fall Risk     10/10/2023    3:49 PM 01/08/2023    2:31 PM 10/12/2022    9:24 AM 10/10/2021    3:02 PM 09/27/2020    2:49 PM  Fall Risk   Falls in the past year? 0 0 0 0 0  Number falls in past yr: 0 0 0  0  Injury with Fall? 0 0 0  0  Risk for fall due to : No Fall Risks No Fall Risks No Fall Risks  Medication side effect  Follow up Education provided;Falls prevention discussed Falls prevention discussed;Education provided;Falls evaluation completed Falls evaluation completed Falls evaluation completed Falls evaluation completed;Falls prevention discussed    MEDICARE RISK AT HOME:  Medicare Risk at Home Any stairs in or around the home?: No If so, are there any without handrails?: No Home free of loose throw rugs in walkways, pet beds, electrical cords, etc?: Yes Adequate lighting in your home to reduce risk of falls?: Yes Life alert?: No Use of a cane, walker or w/c?: No Grab bars in the bathroom?: No Shower chair or bench in shower?: No Elevated toilet seat  or a handicapped toilet?: No  TIMED UP AND GO:  Was the test performed?  No  Cognitive Function: 6CIT completed    09/27/2020    2:52 PM 06/27/2016   11:00 AM  MMSE - Mini Mental State Exam  Orientation to time 5 5  Orientation to Place 5 5  Registration 3 3  Attention/ Calculation 5 0  Recall 3 3  Language- name 2 objects  0  Language- repeat 1 1   Language- follow 3 step command  3  Language- read & follow direction  0  Write a sentence  0  Copy design  0  Total score  20        10/10/2023    4:01 PM 01/08/2023    2:31 PM  6CIT Screen  What Year? 0 points 0 points  What month? 0 points 0 points  What time? 0 points 0 points  Count back from 20 0 points 0 points  Months in reverse 0 points 0 points  Repeat phrase 0 points 0 points  Total Score 0 points 0 points    Immunizations Immunization History  Administered Date(s) Administered   Fluad Quad(high Dose 65+) 05/18/2020, 06/09/2021, 06/13/2022   Fluad Trivalent(High Dose 65+) 04/30/2023   Influenza Split 04/23/2012   Influenza,inj,Quad PF,6+ Mos 04/29/2013, 07/04/2016, 07/08/2017, 04/21/2019   PFIZER(Purple Top)SARS-COV-2 Vaccination 08/30/2019, 09/23/2019, 03/24/2020   Pneumococcal Conjugate-13 07/04/2016   Pneumococcal Polysaccharide-23 12/15/2010, 07/08/2017   Td 03/03/2009   Tdap 08/25/2014    Screening Tests Health Maintenance  Topic Date Due   Zoster Vaccines- Shingrix (1 of 2) Never done   DEXA SCAN  Never done   MAMMOGRAM  10/10/2018   COVID-19 Vaccine (4 - 2024-25 season) 03/10/2023   INFLUENZA VACCINE  02/07/2024   DTaP/Tdap/Td (3 - Td or Tdap) 08/25/2024   Medicare Annual Wellness (AWV)  10/09/2024   Pneumonia Vaccine 54+ Years old  Completed   Hepatitis C Screening  Completed   HPV VACCINES  Aged Out   Colonoscopy  Discontinued    Health Maintenance  Health Maintenance Due  Topic Date Due   Zoster Vaccines- Shingrix (1 of 2) Never done   DEXA SCAN  Never done   MAMMOGRAM  10/10/2018   COVID-19 Vaccine (4 - 2024-25 season) 03/10/2023   Health Maintenance Items Addressed: None needed  Additional Screening:  Vision Screening: Recommended annual ophthalmology exams for early detection of glaucoma and other disorders of the eye.  Dental Screening: Recommended annual dental exams for proper oral hygiene  Community Resource Referral /  Chronic Care Management: CRR required this visit?  No   CCM required this visit?  No     Plan:     I have personally reviewed and noted the following in the patient's chart:   Medical and social history Use of alcohol, tobacco or illicit drugs  Current medications and supplements including opioid prescriptions. Patient is not currently taking opioid prescriptions. Functional ability and status Nutritional status Physical activity Advanced directives List of other physicians Hospitalizations, surgeries, and ER visits in previous 12 months Vitals Screenings to include cognitive, depression, and falls Referrals and appointments  In addition, I have reviewed and discussed with patient certain preventive protocols, quality metrics, and best practice recommendations. A written personalized care plan for preventive services as well as general preventive health recommendations were provided to patient.     Sue Lush, LPN   07/14/1094   After Visit Summary: (MyChart) Due to this being a telephonic visit, the  after visit summary with patients personalized plan was offered to patient via MyChart   Notes: Nothing significant to report at this time.

## 2023-10-10 NOTE — Patient Instructions (Signed)
 Erica Carlson , Thank you for taking time to come for your Medicare Wellness Visit. I appreciate your ongoing commitment to your health goals. Please review the following plan we discussed and let me know if I can assist you in the future.   Referrals/Orders/Follow-Ups/Clinician Recommendations: none  This is a list of the screening recommended for you and due dates:  Health Maintenance  Topic Date Due   Zoster (Shingles) Vaccine (1 of 2) Never done   DEXA scan (bone density measurement)  Never done   Mammogram  10/10/2018   COVID-19 Vaccine (4 - 2024-25 season) 03/10/2023   Medicare Annual Wellness Visit  01/08/2024   Flu Shot  02/07/2024   DTaP/Tdap/Td vaccine (3 - Td or Tdap) 08/25/2024   Pneumonia Vaccine  Completed   Hepatitis C Screening  Completed   HPV Vaccine  Aged Out   Colon Cancer Screening  Discontinued    Advanced directives: (Copy Requested) Please bring a copy of your health care power of attorney and living will to the office to be added to your chart at your convenience. You can mail to [facility] [address] [place], [place] [address] or email to [email or web address]  Next Medicare Annual Wellness Visit scheduled for next year: Yes 10/10/23@ 3:40pm televisit

## 2023-10-14 ENCOUNTER — Other Ambulatory Visit: Payer: Self-pay | Admitting: Pharmacy Technician

## 2023-10-14 ENCOUNTER — Other Ambulatory Visit: Payer: Self-pay | Admitting: Hematology

## 2023-10-14 ENCOUNTER — Other Ambulatory Visit: Payer: Self-pay

## 2023-10-14 DIAGNOSIS — C9110 Chronic lymphocytic leukemia of B-cell type not having achieved remission: Secondary | ICD-10-CM

## 2023-10-14 MED ORDER — CALQUENCE 100 MG PO TABS
100.0000 mg | ORAL_TABLET | Freq: Every day | ORAL | 5 refills | Status: DC
  Filled 2023-10-14: qty 30, 30d supply, fill #0
  Filled 2023-11-11: qty 30, 30d supply, fill #1
  Filled 2023-12-16: qty 30, 30d supply, fill #2
  Filled 2024-01-21: qty 30, 30d supply, fill #3
  Filled 2024-02-14: qty 30, 30d supply, fill #4
  Filled 2024-03-24: qty 30, 30d supply, fill #5

## 2023-10-14 NOTE — Progress Notes (Signed)
 Specialty Pharmacy Refill Coordination Note  Erica Carlson is a 78 y.o. female contacted today regarding refills of specialty medication(s) Acalabrutinib Maleate (Calquence)   Patient requested Delivery   Delivery date: 10/22/23   Verified address: [address] [place] [address]   Medication will be filled on 10/21/23.   This fill date is pending response to refill request from provider. Patient is aware and if they have not received fill by intended date they must follow up with pharmacy.

## 2023-10-15 ENCOUNTER — Ambulatory Visit (INDEPENDENT_AMBULATORY_CARE_PROVIDER_SITE_OTHER): Payer: Medicare PPO | Admitting: Family Medicine

## 2023-10-15 ENCOUNTER — Encounter: Payer: Self-pay | Admitting: Family Medicine

## 2023-10-15 VITALS — BP 148/77 | HR 61 | Temp 98.4°F | Ht 60.5 in | Wt 175.0 lb

## 2023-10-15 DIAGNOSIS — Z789 Other specified health status: Secondary | ICD-10-CM | POA: Diagnosis not present

## 2023-10-15 DIAGNOSIS — E782 Mixed hyperlipidemia: Secondary | ICD-10-CM

## 2023-10-15 DIAGNOSIS — Z0000 Encounter for general adult medical examination without abnormal findings: Secondary | ICD-10-CM

## 2023-10-15 DIAGNOSIS — E669 Obesity, unspecified: Secondary | ICD-10-CM

## 2023-10-15 DIAGNOSIS — Z1231 Encounter for screening mammogram for malignant neoplasm of breast: Secondary | ICD-10-CM | POA: Diagnosis not present

## 2023-10-15 DIAGNOSIS — R7303 Prediabetes: Secondary | ICD-10-CM | POA: Diagnosis not present

## 2023-10-15 DIAGNOSIS — N1832 Chronic kidney disease, stage 3b: Secondary | ICD-10-CM | POA: Diagnosis not present

## 2023-10-15 DIAGNOSIS — I10 Essential (primary) hypertension: Secondary | ICD-10-CM | POA: Diagnosis not present

## 2023-10-15 DIAGNOSIS — C9110 Chronic lymphocytic leukemia of B-cell type not having achieved remission: Secondary | ICD-10-CM

## 2023-10-15 DIAGNOSIS — E2839 Other primary ovarian failure: Secondary | ICD-10-CM

## 2023-10-15 NOTE — Patient Instructions (Addendum)
 Your glucose is in the prediabetes range Watch sugar in diet  Try to get most of your carbohydrates from produce (with the exception of white potatoes) and whole grains Eat less bread/pasta/rice/snack foods/cereals/sweets and other items from the middle of the grocery store (processed carbs)  Keep exercising    I put the referral in for our pharmacist for cholesterol  Please let us know if you don't hear in 1-2 weeks  Blood pressure here is up  Call us in a few days and give me a blood pressure reading at home / it is likely more accurate      You have an order for:  []   2D Mammogram  [x]   3D Mammogram  [x]   Bone Density     Please call for appointment:   []   [facility]  [address] [place] [address]  [phone]  []   [facility])   [address]  [place], [place] [address]  [phone]  [x]   [facility]      [address] [place], [place]        [phone]         []   [facility]  [address] [place], [place]  [phone]  []  [facility]   [address]   [place], [place] [address]  [phone]  []  [facility]  [address] [place], [place] [address] [phone]    Make sure to wear two piece clothing  No lotions powders or deodorants the day of the appointment Make sure to bring picture ID and insurance card.  Bring list of medications you are currently taking including any supplements.   Schedule your screening mammogram through MyChart!   Select [facility] imaging sites can now be scheduled through MyChart.  Log into your MyChart account.  Go to 'Visit' (or 'Appointments' if  on mobile App) --> Schedule an  Appointment  Under 'Select a Reason for Visit' choose the Mammogram   Screening option.  Complete the pre-visit questions  and select the time and place that  best fits your schedule

## 2023-10-15 NOTE — Assessment & Plan Note (Signed)
 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

## 2023-10-15 NOTE — Assessment & Plan Note (Signed)
 bp is not at goal today BP Readings from Last 1 Encounters:  10/15/23 (!) 148/77  Pt notes white coat effect/ it is always lower at home Plans to check and call in next several days with new reading  No changes needed Most recent labs reviewed  Disc lifstyle change with low sodium diet and exercise  Plan to continue  Lisinopril 5 mg daily  Amlodipine 10 mg daily   GFR 40.5

## 2023-10-15 NOTE — Assessment & Plan Note (Signed)
 Discussed how this problem influences overall health and the risks it imposes  Reviewed plan for weight loss with lower calorie diet (via better food choices (lower glycemic and portion control) along with exercise building up to or more than 30 minutes 5 days per week including some aerobic activity and strength training

## 2023-10-15 NOTE — Assessment & Plan Note (Signed)
 Reviewed health habits including diet and exercise and skin cancer prevention Reviewed appropriate screening tests for age  Also reviewed health mt list, fam hx and immunization status , as well as social and family history   See HPI Labs reviewed and ordered Health Maintenance  Topic Date Due   DEXA scan (bone density measurement)  Never done   Mammogram  10/14/2024*   COVID-19 Vaccine (4 - 2024-25 season) 10/30/2024*   Zoster (Shingles) Vaccine (2 of 2) 10/28/2023   Flu Shot  02/07/2024   DTaP/Tdap/Td vaccine (3 - Td or Tdap) 08/25/2024   Medicare Annual Wellness Visit  10/09/2024   Pneumonia Vaccine  Completed   Hepatitis C Screening  Completed   HPV Vaccine  Aged Out   Colon Cancer Screening  Discontinued  *Topic was postponed. The date shown is not the original due date.    Dexa and mammogram ordered  Plans to get 2nd shingrix when due  Discussed fall prevention, supplements and exercise for bone density  PHQ 0 Blood pressure up here-will call with reading form home

## 2023-10-15 NOTE — Assessment & Plan Note (Signed)
 LDL over 200 now  Ref to pharmacy for help with management  ? If could get pcyk9 covered

## 2023-10-15 NOTE — Assessment & Plan Note (Signed)
 Dexa ordered

## 2023-10-15 NOTE — Assessment & Plan Note (Signed)
 Lab Results  Component Value Date   HGBA1C 5.8 10/08/2023   disc imp of low glycemic diet and wt loss to prevent DM2

## 2023-10-15 NOTE — Assessment & Plan Note (Signed)
 Mammogram ordered

## 2023-10-15 NOTE — Progress Notes (Signed)
 Subjective:    Patient ID: Erica Carlson, female    DOB: [birth year], 78 y.o.   MRN: [id number]  HPI  Here for health maintenance exam and to review chronic medical problems   Wt Readings from Last 3 Encounters:  10/15/23 175 lb (79.4 kg)  10/10/23 174 lb (78.9 kg)  07/19/23 178 lb 6.4 oz (80.9 kg)   33.61 kg/m  Vitals:   10/15/23 0858 10/15/23 0931  BP: (!) 156/92 (!) 148/77  Pulse: 61   Temp: 98.4 F (36.9 C)   SpO2: 99%     Immunization History  Administered Date(s) Administered   Fluad Quad(high Dose 65+) 05/18/2020, 06/09/2021, 06/13/2022   Fluad Trivalent(High Dose 65+) 04/30/2023   Influenza Split 04/23/2012   Influenza,inj,Quad PF,6+ Mos 04/29/2013, 07/04/2016, 07/08/2017, 04/21/2019   PFIZER(Purple Top)SARS-COV-2 Vaccination 08/30/2019, 09/23/2019, 03/24/2020   Pneumococcal Conjugate-13 07/04/2016   Pneumococcal Polysaccharide-23 12/15/2010, 07/08/2017   Td 03/03/2009   Tdap 08/25/2014   Zoster Recombinant(Shingrix) 09/02/2023    Health Maintenance Due  Topic Date Due   DEXA SCAN  Never done   Feels ok  Tolerates oncology treatment   Shingrix first 09/02/23  Has not had 2nd one yet    Mammogram 2019-has not been getting them because she has imaging from onc frequen Self breast exam- no lumps   Gyn health No problems    Colon cancer screening  colonoscopy 04/2018   Bone health  Dexa -has not had yet  Falls-none Fractures Supplements -none  Last vitamin D Lab Results  Component Value Date   VD25OH 69.30 10/04/2022    Exercise  Seeing a trainer  Has oa knee and sciatica  Working on leg strength and overall strength  Cannot have surgery due to her CLL   Mood    10/15/2023    9:02 AM 10/10/2023    3:54 PM 01/08/2023    2:27 PM 10/12/2022    9:24 AM 10/10/2021    3:02 PM  Depression screen PHQ 2/9  Decreased Interest 0 0 0 0 0  Down, Depressed, Hopeless 0 0 0 0 0  PHQ - 2 Score 0 0 0 0 0  Altered sleeping 0      Tired, decreased  energy 0      Change in appetite 0      Feeling bad or failure about yourself  0      Trouble concentrating 0      Moving slowly or fidgety/restless 0      Suicidal thoughts 0      PHQ-9 Score 0      Difficult doing work/chores Not difficult at all        HTN bp is stable today  No cp or palpitations or headaches or edema  No side effects to medicines  BP Readings from Last 3 Encounters:  10/15/23 (!) 148/77  07/19/23 (!) 143/66  04/16/23 (!) 172/81    Lisinopril 5 mg daily  Amlodipine 10 mg daily   Checks it a home  125-130/70s usually  No problems    Lab Results  Component Value Date   NA 142 10/08/2023   K 4.1 10/08/2023   CO2 27 10/08/2023   GLUCOSE 94 10/08/2023   BUN 29 (H) 10/08/2023   CREATININE 1.28 (H) 10/08/2023   CALCIUM 9.8 10/08/2023   GFR 40.55 (L) 10/08/2023   EGFR 56 (L) 02/27/2017   GFRNONAA 38 (L) 07/19/2023   CKD 3 GFR is 40.55 most recently  On low dose ace  Drinking lots of water -close to 64 oz per day  No nsaids      Sees oncology for CLL Lab Results  Component Value Date   WBC 6.0 10/08/2023   HGB 11.9 (L) 10/08/2023   HCT 37.8 10/08/2023   MCV 85.7 10/08/2023   PLT 249.0 10/08/2023   Acalabrutinib    Dm screen Lab Results  Component Value Date   HGBA1C 5.8 10/08/2023  Avoids processed food since January  No sweets or added sugar as a rule (unless holiday)       Hyperlipidemia Lab Results  Component Value Date   CHOL 320 (H) 10/08/2023   CHOL 263 (H) 10/04/2022   CHOL 319 (H) 10/02/2021   Lab Results  Component Value Date   HDL 57.00 10/08/2023   HDL 55.70 10/04/2022   HDL 57.40 10/02/2021   Lab Results  Component Value Date   LDLCALC 231 (H) 10/08/2023   LDLCALC 179 (H) 10/04/2022   LDLCALC 232 (H) 10/02/2021   Lab Results  Component Value Date   TRIG 159.0 (H) 10/08/2023   TRIG 143.0 10/04/2022   TRIG 148.0 10/02/2021   Lab Results  Component Value Date   CHOLHDL 6 10/08/2023   CHOLHDL 5  10/04/2022   CHOLHDL 6 10/02/2021   Lab Results  Component Value Date   LDLDIRECT 204.0 09/27/2020   LDLDIRECT 204.9 02/23/2014   LDLDIRECT 249.3 04/23/2012   Has seen cardiology  Could not affford pcyk9 in past Intol of statins     Patient Active Problem List   Diagnosis Date Noted   Prediabetes 10/06/2023   Statin intolerance 10/12/2022   CKD (chronic kidney disease) stage 3, GFR 30-59 ml/min (HCC) 11/23/2021   Encounter for screening mammogram for breast cancer 10/10/2021   Elevated serum creatinine 10/10/2021   Hand pain 10/04/2020   CLL (chronic lymphocytic leukemia) (HCC) 08/31/2019   Counseling regarding advance care planning and goals of care 08/31/2019   Need for hepatitis C screening test 06/26/2016   Obesity (BMI 30-39.9) 10/01/2013   Encounter for Medicare annual wellness exam 09/30/2013   Estrogen deficiency 09/17/2012   Herpes zoster without complication 07/09/2011   Special screening for malignant neoplasms, colon 12/15/2010   Routine general medical examination at a health care facility 12/08/2010   OSTEOARTHRITIS, KNEE, RIGHT 10/11/2008   Internal hemorrhoids 09/02/2008   Hypertension 04/28/2008   PNEUMONIA, HX OF 03/03/2008   Hyperlipidemia 02/26/2008   Past Medical History:  Diagnosis Date   Allergy    spring   Arthritis    osteo arthritis of right knee   Cataract    forming   Hyperlipidemia    Hypertension    Lymphoma, small-cell (HCC)    right axillary    Pneumonia    Past Surgical History:  Procedure Laterality Date   ABDOMINAL HYSTERECTOMY     AXILLARY LYMPH NODE BIOPSY Right 02/07/2016   back injections     BREAST SURGERY     COLONOSCOPY  2009   EYE SURGERY     retinal tear repair   KNEE SURGERY     torn [facility] per pt   Social History   Tobacco Use   Smoking status: Never   Smokeless tobacco: Never  Vaping Use   Vaping status: Never Used  Substance Use Topics   Alcohol use: No    Alcohol/week: 0.0 standard drinks of alcohol    Drug use: No   Family History  Problem Relation Age of Onset   Breast cancer Sister  Hyperlipidemia Son    Schizophrenia Mother    Colon cancer Neg Hx    Colon polyps Neg Hx    Esophageal cancer Neg Hx    Rectal cancer Neg Hx    Stomach cancer Neg Hx    Allergies  Allergen Reactions   Crestor [Rosuvastatin] Nausea Only    Muscle pain also   Hctz [Hydrochlorothiazide] Nausea And Vomiting    Nausea and vomiting    Lipitor [Atorvastatin] Other (See Comments)    Ache in back and legs   Current Outpatient Medications on File Prior to Visit  Medication Sig Dispense Refill   acalabrutinib maleate (CALQUENCE) 100 MG tablet Take 1 tablet (100 mg total) by mouth daily. 30 tablet 5   amLODipine (NORVASC) 10 MG tablet TAKE 1 TABLET(10 MG) BY MOUTH DAILY 90 tablet 3   Cholecalciferol (VITAMIN D-3) 5000 units TABS Take 1 tablet by mouth daily.     lisinopril (ZESTRIL) 10 MG tablet Take 0.5 tablets (5 mg total) by mouth daily. 45 tablet 3   No current facility-administered medications on file prior to visit.    Review of Systems  Constitutional:  Positive for fatigue. Negative for activity change, appetite change, fever and unexpected weight change.  HENT:  Negative for congestion, ear pain, rhinorrhea, sinus pressure and sore throat.   Eyes:  Negative for pain, redness and visual disturbance.  Respiratory:  Negative for cough, shortness of breath and wheezing.   Cardiovascular:  Negative for chest pain and palpitations.  Gastrointestinal:  Negative for abdominal pain, blood in stool, constipation and diarrhea.  Endocrine: Negative for polydipsia and polyuria.  Genitourinary:  Negative for dysuria, frequency and urgency.  Musculoskeletal:  Negative for arthralgias, back pain and myalgias.  Skin:  Negative for pallor and rash.  Allergic/Immunologic: Negative for environmental allergies.  Neurological:  Negative for dizziness, syncope and headaches.  Hematological:  Negative for  adenopathy. Does not bruise/bleed easily.  Psychiatric/Behavioral:  Negative for decreased concentration and dysphoric mood. The patient is not nervous/anxious.        Objective:   Physical Exam Constitutional:      General: She is not in acute distress.    Appearance: Normal appearance. She is well-developed. She is not ill-appearing or diaphoretic.  HENT:     Head: Normocephalic and atraumatic.     Right Ear: Tympanic membrane, ear canal and external ear normal.     Left Ear: Tympanic membrane, ear canal and external ear normal.     Nose: Nose normal. No congestion.     Mouth/Throat:     Mouth: Mucous membranes are moist.     Pharynx: Oropharynx is clear. No posterior oropharyngeal erythema.  Eyes:     General: No scleral icterus.    Extraocular Movements: Extraocular movements intact.     Conjunctiva/sclera: Conjunctivae normal.     Pupils: Pupils are equal, round, and reactive to light.  Neck:     Thyroid: No thyromegaly.     Vascular: No carotid bruit or JVD.  Cardiovascular:     Rate and Rhythm: Normal rate and regular rhythm.     Pulses: Normal pulses.     Heart sounds: Normal heart sounds.     No gallop.  Pulmonary:     Effort: Pulmonary effort is normal. No respiratory distress.     Breath sounds: Normal breath sounds. No wheezing.     Comments: Good air exch Chest:     Chest wall: No tenderness.  Abdominal:     General:  Bowel sounds are normal. There is no distension or abdominal bruit.     Palpations: Abdomen is soft. There is no mass.     Tenderness: There is no abdominal tenderness.     Hernia: No hernia is present.  Genitourinary:    Comments: Breast exam: No mass, nodules, thickening, tenderness, bulging, retraction, inflamation, nipple discharge or skin changes noted.  No axillary or clavicular LA.     Musculoskeletal:        General: No tenderness. Normal range of motion.     Cervical back: Normal range of motion and neck supple. No rigidity. No muscular  tenderness.     Right lower leg: No edema.     Left lower leg: No edema.     Comments: No kyphosis   Lymphadenopathy:     Cervical: No cervical adenopathy.  Skin:    General: Skin is warm and dry.     Coloration: Skin is not pale.     Findings: No erythema or rash.     Comments: Few skin tags and lentigines   Neurological:     Mental Status: She is alert. Mental status is at baseline.     Cranial Nerves: No cranial nerve deficit.     Motor: No abnormal muscle tone.     Coordination: Coordination normal.     Gait: Gait normal.     Deep Tendon Reflexes: Reflexes are normal and symmetric. Reflexes normal.  Psychiatric:        Mood and Affect: Mood normal.        Cognition and Memory: Cognition and memory normal.           Assessment & Plan:   Problem List Items Addressed This Visit       Cardiovascular and Mediastinum   Hypertension   bp is not at goal today BP Readings from Last 1 Encounters:  10/15/23 (!) 148/77  Pt notes white coat effect/ it is always lower at home Plans to check and call in next several days with new reading  No changes needed Most recent labs reviewed  Disc lifstyle change with low sodium diet and exercise  Plan to continue  Lisinopril 5 mg daily  Amlodipine 10 mg daily   GFR 40.5          Genitourinary   CKD (chronic kidney disease) stage 3, GFR 30-59 ml/min (HCC)   Stable  GFR 40.5  Drinking water On low dose ace  No nsaids Continue to monitor   Hb is 11.9 /also CLL        Other   Statin intolerance   LDL over 200 now  Ref to pharmacy for help with management  ? If could get pcyk9 covered       Routine general medical examination at a health care facility - Primary   Reviewed health habits including diet and exercise and skin cancer prevention Reviewed appropriate screening tests for age  Also reviewed health mt list, fam hx and immunization status , as well as social and family history   See HPI Labs reviewed and  ordered Health Maintenance  Topic Date Due   DEXA scan (bone density measurement)  Never done   Mammogram  10/14/2024*   COVID-19 Vaccine (4 - 2024-25 season) 10/30/2024*   Zoster (Shingles) Vaccine (2 of 2) 10/28/2023   Flu Shot  02/07/2024   DTaP/Tdap/Td vaccine (3 - Td or Tdap) 08/25/2024   Medicare Annual Wellness Visit  10/09/2024   Pneumonia Vaccine  Completed  Hepatitis C Screening  Completed   HPV Vaccine  Aged Out   Colon Cancer Screening  Discontinued  *Topic was postponed. The date shown is not the original due date.    Dexa and mammogram ordered  Plans to get 2nd shingrix when due  Discussed fall prevention, supplements and exercise for bone density  PHQ 0 Blood pressure up here-will call with reading form home       Prediabetes   Lab Results  Component Value Date   HGBA1C 5.8 10/08/2023   disc imp of low glycemic diet and wt loss to prevent DM2       Obesity (BMI 30-39.9)   Discussed how this problem influences overall health and the risks it imposes  Reviewed plan for weight loss with lower calorie diet (via better food choices (lower glycemic and portion control) along with exercise building up to or more than 30 minutes 5 days per week including some aerobic activity and strength training         Hyperlipidemia   Disc goals for lipids and reasons to control them Rev last labs with pt Rev low sat fat diet in detail LDL is up to 231 Very high  Vascular risks are up   In past intol of all statins Could not afford pcyk9 meds Now with higher LDL -this may be covered ?   Referred to pharmacy team to work with her   Encouraged good diet       Relevant Orders   AMB Referral VBCI Care Management   Estrogen deficiency   Dexa ordered       Relevant Orders   DG Bone Density   Encounter for screening mammogram for breast cancer   Mammogram ordered       Relevant Orders   MM 3D SCREENING MAMMOGRAM BILATERAL BREAST   CLL (chronic lymphocytic  leukemia) (HCC)   Continues onc care Doing well  Acalabrutinib -tolerating well

## 2023-10-15 NOTE — Assessment & Plan Note (Signed)
 Continues onc care Doing well  Acalabrutinib -tolerating well

## 2023-10-15 NOTE — Assessment & Plan Note (Signed)
 Disc goals for lipids and reasons to control them Rev last labs with pt Rev low sat fat diet in detail LDL is up to 231 Very high  Vascular risks are up   In past intol of all statins Could not afford pcyk9 meds Now with higher LDL -this may be covered ?   Referred to pharmacy team to work with her   Encouraged good diet

## 2023-10-16 DIAGNOSIS — M9905 Segmental and somatic dysfunction of pelvic region: Secondary | ICD-10-CM | POA: Diagnosis not present

## 2023-10-16 DIAGNOSIS — M9901 Segmental and somatic dysfunction of cervical region: Secondary | ICD-10-CM | POA: Diagnosis not present

## 2023-10-16 DIAGNOSIS — M5032 Other cervical disc degeneration, mid-cervical region, unspecified level: Secondary | ICD-10-CM | POA: Diagnosis not present

## 2023-10-16 DIAGNOSIS — M47814 Spondylosis without myelopathy or radiculopathy, thoracic region: Secondary | ICD-10-CM | POA: Diagnosis not present

## 2023-10-16 DIAGNOSIS — S338XXA Sprain of other parts of lumbar spine and pelvis, initial encounter: Secondary | ICD-10-CM | POA: Diagnosis not present

## 2023-10-16 DIAGNOSIS — M9902 Segmental and somatic dysfunction of thoracic region: Secondary | ICD-10-CM | POA: Diagnosis not present

## 2023-10-21 ENCOUNTER — Other Ambulatory Visit: Payer: Self-pay

## 2023-10-22 DIAGNOSIS — M5032 Other cervical disc degeneration, mid-cervical region, unspecified level: Secondary | ICD-10-CM | POA: Diagnosis not present

## 2023-10-22 DIAGNOSIS — M47814 Spondylosis without myelopathy or radiculopathy, thoracic region: Secondary | ICD-10-CM | POA: Diagnosis not present

## 2023-10-22 DIAGNOSIS — M9902 Segmental and somatic dysfunction of thoracic region: Secondary | ICD-10-CM | POA: Diagnosis not present

## 2023-10-22 DIAGNOSIS — S338XXA Sprain of other parts of lumbar spine and pelvis, initial encounter: Secondary | ICD-10-CM | POA: Diagnosis not present

## 2023-10-22 DIAGNOSIS — M9901 Segmental and somatic dysfunction of cervical region: Secondary | ICD-10-CM | POA: Diagnosis not present

## 2023-10-22 DIAGNOSIS — M9905 Segmental and somatic dysfunction of pelvic region: Secondary | ICD-10-CM | POA: Diagnosis not present

## 2023-10-24 DIAGNOSIS — M9901 Segmental and somatic dysfunction of cervical region: Secondary | ICD-10-CM | POA: Diagnosis not present

## 2023-10-24 DIAGNOSIS — M5032 Other cervical disc degeneration, mid-cervical region, unspecified level: Secondary | ICD-10-CM | POA: Diagnosis not present

## 2023-10-24 DIAGNOSIS — M9905 Segmental and somatic dysfunction of pelvic region: Secondary | ICD-10-CM | POA: Diagnosis not present

## 2023-10-24 DIAGNOSIS — M47814 Spondylosis without myelopathy or radiculopathy, thoracic region: Secondary | ICD-10-CM | POA: Diagnosis not present

## 2023-10-24 DIAGNOSIS — M9902 Segmental and somatic dysfunction of thoracic region: Secondary | ICD-10-CM | POA: Diagnosis not present

## 2023-10-24 DIAGNOSIS — S338XXA Sprain of other parts of lumbar spine and pelvis, initial encounter: Secondary | ICD-10-CM | POA: Diagnosis not present

## 2023-10-29 ENCOUNTER — Other Ambulatory Visit: Payer: Medicare PPO

## 2023-10-29 ENCOUNTER — Ambulatory Visit: Payer: Medicare PPO | Admitting: Hematology

## 2023-10-30 ENCOUNTER — Telehealth: Payer: Self-pay

## 2023-10-30 NOTE — Progress Notes (Signed)
 Care Guide Pharmacy Note  10/30/2023 Name: Erica Carlson MRN: [id number] DOB: 06-27-1946  Referred By: Clemens Curt, MD Reason for referral: Complex Care Management (Outreach to schedule with Pharm d )   Erica Carlson is a 78 y.o. year old female who is a primary care patient of Tower, Manley Seeds, MD.  Erica Carlson was referred to the pharmacist for assistance related to: HLD  Successful contact was made with the patient to discuss pharmacy services including being ready for the pharmacist to call at least 5 minutes before the scheduled appointment time and to have medication bottles and any blood pressure readings ready for review. The patient agreed to meet with the pharmacist via telephone visit on (date/time).11/07/2023  Lenton Rail , RMA     [facility], [facility] Guide  Direct Dial: [phone]  Website: [email or web address]

## 2023-10-30 NOTE — Progress Notes (Signed)
 error

## 2023-11-04 NOTE — Progress Notes (Deleted)
 11/07/2023 Name: Erica Carlson MRN: [id number] DOB: [birth year]  Subjective  No chief complaint on file.  Reason for visit: ?  Erica Carlson is a 78 y.o. female who presents today for an initial HLD pharmacotherapy/medication access visit.? Pertinent PMH includes HLD, HTN, CKD3, obesity, CLL, statin intolerance.  Care Team: Primary Care Provider: Clemens Curt, MD   Date of Last Visit: with PCP on 10/15/23   Recent Summary of Change: Would like to start PCSK9i if possible    HPI: Previous discussions of starting ezetimibe though at the time was cost prohibitive due to brand-only status. Intolerant of multiple preferred statin medications in the past, as below.   Reported Lipid Regimen: ?  N/A - stopped statin d/t SAMS   Lipid-lowering medications tried in the past:?  Atorvastatin  (myalgia; muscle symptoms even on lowest dose) Rosuvastatin  (myalgia; ***) Simvastatin   Cardiovascular Risk Reduction History of clinical ASCVD? no The 10-year ASCVD risk score (Arnett DK, et al., 2019) is: 27.6% Family Hx: *** History of heart failure? no History of hyperlipidemia? yes Current BMI: 34.1 kg/m2 (Ht 60.5 in, Wt 177 kg) Taking statin? intolerant (atorvastatin , rosuvastatin ) Taking aspirin? not indicated; Not taking   Taking SGLT-2i? no Taking GLP- 1 RA? no  Medication Access Prescription drug coverage: YES Payor: HUMANA MEDICARE / Plan: HUMANA MEDICARE CHOICE PPO / Product Type: *No Product type* / .  Summary of Benefit: ***  Current Patient Assistance: {LZ MAP program list:31540} 2 people   Patient lives in a household of {NUMBERS:20191} {LZ MAP income choice:31503} via {Source of Income:2501865778}.  Medicare LIS Eligible: {YES/NO:21197} {LZLISELIG2024:31094}  2024 Poverty Guidelines (all states except [place]  and [place] )    ? 100%  133%  150%  200%  250%  300%  400%  500%   1  $14,580  $19,391  $21,870  $29,160  $36,450  $43,740  $58,320  $72,900   2  $19,720   $26,228  $29,580  $39,440  $49,300  $59,160  $78,880  $98,600   3  $24,860  $33,064  $37,290  $49,720  $62,150  $74,580  $99,440  $124,300   4  $30,000  $39,900  $45,000  $60,000  $75,000  $90,000  $120,000  $150,000    Medication Access/Adherence: Prescription drug coverage: Payor: HUMANA MEDICARE / Plan: HUMANA MEDICARE CHOICE PPO / Product Type: *No Product type* / .  Reports that all medications are affordable though brand medications have been cost-prohibitive Current Patient Assistance: None    _______________________________________________  Objective    Physical Examination:  Vitals:  Wt Readings from Last 3 Encounters:  11/06/23 177 lb 9.6 oz (80.6 kg)  10/15/23 175 lb (79.4 kg)  10/10/23 174 lb (78.9 kg)   BP Readings from Last 3 Encounters:  11/06/23 138/66  10/15/23 (!) 148/77  07/19/23 (!) 143/66   Pulse Readings from Last 3 Encounters:  11/06/23 66  10/15/23 61  07/19/23 64    Labs:? Lab Results  Component Value Date   CHOL 320 (H) 10/08/2023   LDLCALC 231 (H) 10/08/2023   LDLCALC 179 (H) 10/04/2022   LDLCALC 232 (H) 10/02/2021   LDLDIRECT 204.0 09/27/2020   LDLDIRECT 204.9 02/23/2014   HDL 57.00 10/08/2023   TRIG 159.0 (H) 10/08/2023   TRIG 143.0 10/04/2022   TRIG 148.0 10/02/2021   ALT 13 11/06/2023   ALT 13 10/08/2023   AST 14 (L) 11/06/2023   AST 12 10/08/2023   Lab Results  Component Value Date   HGBA1C 5.8 10/08/2023  GLUCOSE 90 11/06/2023   CREATININE 1.17 (H) 11/06/2023   CREATININE 1.28 (H) 10/08/2023   CREATININE 1.44 (H) 07/19/2023   GFR 40.55 (L) 10/08/2023   GFR 42.83 (L) 10/04/2022   GFR 35.67 (L) 11/14/2021     Chemistry      Component Value Date/Time   NA 138 11/06/2023 1134   NA 139 02/27/2017 1002   K 4.2 11/06/2023 1134   K 4.4 02/27/2017 1002   CL 106 11/06/2023 1134   CO2 27 11/06/2023 1134   CO2 28 02/27/2017 1002   BUN 23 11/06/2023 1134   BUN 24.0 02/27/2017 1002   CREATININE 1.17 (H) 11/06/2023 1134    CREATININE 1.1 02/27/2017 1002      Component Value Date/Time   CALCIUM  9.6 11/06/2023 1134   CALCIUM  10.1 02/27/2017 1002   ALKPHOS 72 11/06/2023 1134   ALKPHOS 71 02/27/2017 1002   AST 14 (L) 11/06/2023 1134   AST 15 02/27/2017 1002   ALT 13 11/06/2023 1134   ALT 14 02/27/2017 1002   BILITOT 0.4 11/06/2023 1134   BILITOT 0.43 02/27/2017 1002      The 10-year ASCVD risk score (Arnett DK, et al., 2019) is: 27.6%  Assessment and Plan:    Hyperlipidemia (primary prevention of ASCVD):  Uncontrolled  on last lipid panel (10/08/23): TC 320 mg/dL, LDL 161 mg/dL, TG 096 mg/dL. LDL significantly elevated above goal <55 mg/dL (primary prevention with diabetes and multiple risk factors). Goal of <70 mg/dL very reasonable if unable to achieve.  - Current Regimen: None.  - Previous therapies: Atorvastatin , rosuvastatin  (myalgia, resolved after discontinuation) - Key risk factors include: hypertension, hyperlipidemia, and BMI >30 kg/m2 - The 10-year ASCVD risk score (Arnett DK, et al., 2019) is: 27.6% indicated patient is at High risk.  Start Repatha 140 mg sq q14 days Prior Auth Key: [id number] Repeat lipid panel 4-12 weeks.        Future Consideration: Ezetimibe: IMPROVE-IT PCSK9i: 15% reduction in MACE outcomes per FOURIER/ODYSSEY trials.  Bempedoic acid (Nexletol): CLEAR-Harmony trial Inclisiran Charletta Cons): ***  Follow Up Follow up with clinical pharmacist via *** in *** weeks Patient given direct line for questions regarding medication therapy and MAP applications***  Future Appointments  Date Time Provider Department Center  11/07/2023 10:00 AM [facility] PHARMACIST [facility]  10/12/2024  3:40 PM [facility] ANNUAL WELLNESS VISIT 1 [facility]    Daron Ellen, PharmD Clinical Pharmacist [facility] [phone]

## 2023-11-05 ENCOUNTER — Other Ambulatory Visit: Payer: Self-pay

## 2023-11-05 DIAGNOSIS — C9110 Chronic lymphocytic leukemia of B-cell type not having achieved remission: Secondary | ICD-10-CM

## 2023-11-06 ENCOUNTER — Other Ambulatory Visit (HOSPITAL_COMMUNITY): Payer: Self-pay

## 2023-11-06 ENCOUNTER — Inpatient Hospital Stay: Attending: Hematology

## 2023-11-06 ENCOUNTER — Inpatient Hospital Stay (HOSPITAL_BASED_OUTPATIENT_CLINIC_OR_DEPARTMENT_OTHER): Admitting: Hematology

## 2023-11-06 VITALS — BP 138/66 | HR 66 | Temp 98.0°F | Resp 18 | Ht 60.5 in | Wt 177.6 lb

## 2023-11-06 DIAGNOSIS — Z79899 Other long term (current) drug therapy: Secondary | ICD-10-CM | POA: Insufficient documentation

## 2023-11-06 DIAGNOSIS — C9110 Chronic lymphocytic leukemia of B-cell type not having achieved remission: Secondary | ICD-10-CM

## 2023-11-06 LAB — CBC WITH DIFFERENTIAL (CANCER CENTER ONLY)
Abs Immature Granulocytes: 0.02 10*3/uL (ref 0.00–0.07)
Basophils Absolute: 0 10*3/uL (ref 0.0–0.1)
Basophils Relative: 1 %
Eosinophils Absolute: 0.3 10*3/uL (ref 0.0–0.5)
Eosinophils Relative: 4 %
HCT: 37.9 % (ref 36.0–46.0)
Hemoglobin: 12 g/dL (ref 12.0–15.0)
Immature Granulocytes: 0 %
Lymphocytes Relative: 45 %
Lymphs Abs: 3.1 10*3/uL (ref 0.7–4.0)
MCH: 26.6 pg (ref 26.0–34.0)
MCHC: 31.7 g/dL (ref 30.0–36.0)
MCV: 84 fL (ref 80.0–100.0)
Monocytes Absolute: 0.4 10*3/uL (ref 0.1–1.0)
Monocytes Relative: 6 %
Neutro Abs: 3 10*3/uL (ref 1.7–7.7)
Neutrophils Relative %: 44 %
Platelet Count: 244 10*3/uL (ref 150–400)
RBC: 4.51 MIL/uL (ref 3.87–5.11)
RDW: 14 % (ref 11.5–15.5)
WBC Count: 6.8 10*3/uL (ref 4.0–10.5)
nRBC: 0 % (ref 0.0–0.2)

## 2023-11-06 LAB — CMP (CANCER CENTER ONLY)
ALT: 13 U/L (ref 0–44)
AST: 14 U/L — ABNORMAL LOW (ref 15–41)
Albumin: 4.3 g/dL (ref 3.5–5.0)
Alkaline Phosphatase: 72 U/L (ref 38–126)
Anion gap: 5 (ref 5–15)
BUN: 23 mg/dL (ref 8–23)
CO2: 27 mmol/L (ref 22–32)
Calcium: 9.6 mg/dL (ref 8.9–10.3)
Chloride: 106 mmol/L (ref 98–111)
Creatinine: 1.17 mg/dL — ABNORMAL HIGH (ref 0.44–1.00)
GFR, Estimated: 48 mL/min — ABNORMAL LOW (ref >60)
Glucose, Bld: 90 mg/dL (ref 70–99)
Potassium: 4.2 mmol/L (ref 3.5–5.1)
Sodium: 138 mmol/L (ref 135–145)
Total Bilirubin: 0.4 mg/dL (ref 0.0–1.2)
Total Protein: 6.7 g/dL (ref 6.5–8.1)

## 2023-11-06 LAB — LACTATE DEHYDROGENASE: LDH: 156 U/L (ref 98–192)

## 2023-11-06 NOTE — Progress Notes (Signed)
 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  INTERVAL HISTORY:   Erica Carlson is a 78 y.o. 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  Patient was last seen by me on 07/19/2023 and reported mld intermittent knee pain, sciatic nerve issue due to walking, and recurrent pressure wound on her back occurring every 2 years.   Today, she reports that she has been doing well over the last 3-4 months with no new concerns.   She has been taking one tablet (100 MG total) of calquence  regularly with no missed doses. She is tolerating Claquence well with no major toxicity issues.   Patient has been eating and sleeping well and her energy levels have been fairly stable. She denies any fever, chills, night sweats, new lumps/bumps, infection issues, memory/confusion issues, chest pain, SOB, abdominal pain, or leg swelling.   She complains of back pain, though she has no issues at this time.   She reports that there are considerations of knee replacement surgery.  Patient continues to engage in physical therapy. She notes endorsing pain sometimes with physical therapy. Patient reports that she is unable to walk long distances, though this is not significantly limiting.   Her blood pressure in clinic today is 138/66. Patient noted to generally have white coat hypertension. She generally regularly checks her blood pressure at home twice a week.   MEDICAL HISTORY:  Past Medical History:  Diagnosis Date   Allergy    spring   Arthritis     osteo arthritis of right knee   Cataract    forming   Hyperlipidemia    Hypertension    Lymphoma, small-cell (HCC)    right axillary    Pneumonia     SURGICAL HISTORY: Past Surgical History:  Procedure Laterality Date   ABDOMINAL HYSTERECTOMY     AXILLARY LYMPH NODE BIOPSY Right 02/07/2016   back injections     BREAST SURGERY     COLONOSCOPY  2009   EYE SURGERY     retinal tear repair   KNEE SURGERY     torn ALC per pt    SOCIAL HISTORY: Social History   Socioeconomic History   Marital status: Widowed    Spouse name: n/a   Number of children: 6   Years of education: 14   Highest education level: Not on file  Occupational History   Occupation: self-employed    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   Her adult children live nearby.   Social Drivers of Corporate investment banker Strain: Low Risk  (10/10/2023)   Overall Financial Resource Strain (CARDIA)    Difficulty of Paying Living Expenses: Not hard at all  Food Insecurity: No Food Insecurity (10/10/2023)   Hunger Vital Sign    Worried About Running Out of Food in the Last Year: Never true    Ran Out of Food in the Last Year: Never true  Transportation Needs: No Transportation Needs (10/10/2023)   PRAPARE - Administrator, Civil Service (Medical): No    Lack of Transportation (Non-Medical): No  Physical Activity: Insufficiently Active (10/10/2023)   Exercise Vital Sign    Days of Exercise per Week: 2 days    Minutes of Exercise per Session: 60 min  Stress: No Stress Concern Present (10/10/2023)   [employer] of Occupational Health - Occupational Stress  Questionnaire    Feeling of Stress : Not at all  Social Connections: Moderately Integrated (10/10/2023)   Social Connection and Isolation Panel [NHANES]    Frequency of Communication with Friends and Family: More than three times a week    Frequency of Social Gatherings with Friends and Family: Three times a week    Attends Religious Services: More than 4 times per year    Active Member of Clubs or Organizations: No    Attends Banker Meetings: Never    Marital Status: Married  Catering manager Violence: Not At Risk (10/10/2023)   Humiliation, Afraid, Rape, and Kick questionnaire    Fear of Current or Ex-Partner: No    Emotionally Abused: No    Physically Abused: No    Sexually Abused: No    FAMILY HISTORY: Family History  Problem Relation Age of Onset   Breast cancer Sister    Hyperlipidemia Son    Schizophrenia Mother    Colon cancer Neg Hx    Colon polyps Neg Hx    Esophageal cancer Neg Hx    Rectal cancer Neg Hx    Stomach cancer Neg Hx     ALLERGIES:  is allergic to crestor  [rosuvastatin ], hctz [hydrochlorothiazide ], and lipitor [atorvastatin ].  MEDICATIONS:  Current Outpatient Medications  Medication Sig Dispense Refill   acalabrutinib  maleate (CALQUENCE ) 100 MG tablet Take 1 tablet (100 mg total) by mouth daily. 30 tablet 5   amLODipine  (NORVASC ) 10 MG tablet TAKE 1 TABLET(10 MG) BY MOUTH DAILY 90 tablet 3   Cholecalciferol (VITAMIN D -3) 5000 units TABS Take 1 tablet by mouth daily.     lisinopril  (ZESTRIL ) 10 MG tablet Take 0.5 tablets (5 mg total) by mouth daily. 45 tablet 3   No current facility-administered medications for this visit.    REVIEW OF SYSTEMS:    10 Point review of Systems was done is negative except as noted above.   PHYSICAL EXAMINATION: .There were no vitals taken for this visit.   GENERAL:alert, in no acute distress and comfortable SKIN: no acute rashes, no significant lesions EYES: conjunctiva are pink and non-injected,  sclera anicteric OROPHARYNX: MMM, no exudates, no oropharyngeal erythema or ulceration NECK: supple, no JVD LYMPH:  no palpable lymphadenopathy in the cervical, axillary or inguinal regions LUNGS: clear to auscultation b/l with normal respiratory effort HEART: regular rate & rhythm ABDOMEN:  normoactive bowel sounds , non tender, not distended. Extremity: no pedal edema PSYCH: alert & oriented x 3 with fluent speech NEURO: no focal motor/sensory deficits    LABORATORY DATA:  I have reviewed the data as listed  .    Latest Ref Rng & Units 10/08/2023    8:50 AM 07/19/2023   11:43 AM 04/16/2023    9:54 AM  CBC  WBC 4.0 - 10.5 K/uL 6.0  6.0  5.8   Hemoglobin 12.0 - 15.0 g/dL 40.9  81.1  91.4   Hematocrit 36.0 - 46.0 % 37.8  38.3  39.4   Platelets 150.0 - 400.0 K/uL 249.0  242  228     .    Latest Ref Rng & Units 10/08/2023    8:50 AM 07/19/2023   11:43 AM 04/16/2023    9:54 AM  CMP  Glucose 70 - 99 mg/dL 94  84  782   BUN 6 - 23 mg/dL 29  28  26    Creatinine 0.40 - 1.20 mg/dL 9.56  2.13  0.86   Sodium 135 - 145 mEq/L 142  138  138   Potassium 3.5 - 5.1 mEq/L 4.1  4.4  4.1   Chloride 96 - 112 mEq/L 108  106  106   CO2 19 - 32 mEq/L 27  27  26    Calcium  8.4 - 10.5 mg/dL 9.8  9.9  9.8   Total Protein 6.0 - 8.3 g/dL 6.4  6.9  6.6   Total Bilirubin 0.2 - 1.2 mg/dL 0.3  0.5  0.3   Alkaline Phos 39 - 117 U/L 70  70  66   AST 0 - 37 U/L 12  15  11    ALT 0 - 35 U/L 13  11  10     . Lab Results  Component Value Date   LDH 150 07/19/2023     09/22/18 Right Axillary LN Biopsy:    07/31/18 Molecular Pathology:     02/21/16 Biopsy:    RADIOGRAPHIC STUDIES: I have personally reviewed the radiological images as listed and agreed with the findings in the report. No results found.   ASSESSMENT & PLAN:   78 y.o. female with  1. Chronic Lymphocytic Leukemia  02/21/16 Right Axillary LN biopsy revealed SLL, the initial diagnosis. 07/31/18 FISH CLL Prognostic Panel  revealed:52.0% cells with 11q deletion Labs upon initial presentation from 09/22/18, WBC at 16.8k, HGB at 10.9, PLT at 272k. ANC at 1.2k, Lymphs at 11.3k, Monocytes at 4.1k. 09/22/18 Right Axillary LN Biopsy revealed SLL, ruling out a transformation event  08/15/18 PET/CT revealed Bulky lymphadenopathy in the neck, chest, abdomen, and pelvis demonstrating low level hypermetabolism throughout. 9 mm right thyroid  nodule is hypermetabolic. Thyroid  ultrasound recommended to further evaluate. Mucosal thickening with air-fluid level in the left maxillary sinus. 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  08/12/2019 PET/CT scan ([id number]) revealed disease progression in neck, chest, abdomen, pelvis and interval development of pulmonary lesions  03/23/2020 CT C/A/P ([id number]) ([id number]) revealed. "1. Significant positive response to therapy. No new or progressive disease."  PLAN:  -Discussed lab results on 11/06/23 in detail with patient. CBC normal, showed WBC of 6.8K, hemoglobin of 12.0, and platelets of 244K. -lymphocytes WNL -CMP stable -She has been tolerating calquence  100 mg once a day well with no new or severe toxicities -continue calquence  100 mg once a day -discussed that we would continue Calquence  as long as she tolerates it, it continues to work, and she chooses to -depending on how extensive a given dental procedure is, her Calquence  would be adjusted accordingly. Discussed main concern of Calquence  potentially delaying the formation of new blood vessels and mildly increasing the risk of bleeding, though this is generally not an issue.  -For considerations of internal surgery, she should hold Calquence  for a few weeks before and after surgery -for considerations of knee surgery, the risk of bleeding is fairly low. There may be a role to hold Calquence   for about 1 week prior to surgery and restart it about 1 week after surgery.  -answered all of patient's questions in  detail -patient shall return to clinic with labs in 3-4 months   FOLLOW UP: RTC with Dr Salomon Cree with labs in 3 months  The total time spent in the appointment was *** minutes* .  All of the patient's questions were answered with apparent satisfaction. The patient knows to call the clinic with any problems, questions or concerns.   Jacquelyn Matt MD MS AAHIVMS [facility] Hematology/Oncology Physician [facility]  .*Total Encounter Time as defined by the Centers for Medicare and Medicaid Services includes, in addition to the face-to-face time of a patient visit (documented in the note above) non-face-to-face time: obtaining and reviewing outside history, ordering and reviewing medications, tests or procedures, care coordination (communications with other health care professionals or caregivers) and documentation in the medical record.    I,Mitra Faeizi,acting as a Neurosurgeon for Jacquelyn Matt, MD.,have documented all relevant documentation on the behalf of Jacquelyn Matt, MD,as directed by  Jacquelyn Matt, MD while in the presence of Jacquelyn Matt, MD.  ***

## 2023-11-07 ENCOUNTER — Telehealth: Payer: Self-pay | Admitting: Hematology

## 2023-11-07 ENCOUNTER — Other Ambulatory Visit

## 2023-11-07 NOTE — Telephone Encounter (Signed)
 Spoke with patient confirming upcoming appointment

## 2023-11-11 ENCOUNTER — Other Ambulatory Visit: Payer: Self-pay | Admitting: Pharmacy Technician

## 2023-11-11 ENCOUNTER — Other Ambulatory Visit: Payer: Self-pay

## 2023-11-11 NOTE — Progress Notes (Signed)
 Specialty Pharmacy Refill Coordination Note  Erica Carlson is a 78 y.o. female contacted today regarding refills of specialty medication(s) Acalabrutinib  Maleate (Calquence )   Patient requested Delivery   Delivery date: 11/22/23   Verified address: [address]  [place] [address]   Medication will be filled on 11/21/23.

## 2023-11-13 DIAGNOSIS — M9902 Segmental and somatic dysfunction of thoracic region: Secondary | ICD-10-CM | POA: Diagnosis not present

## 2023-11-13 DIAGNOSIS — M9905 Segmental and somatic dysfunction of pelvic region: Secondary | ICD-10-CM | POA: Diagnosis not present

## 2023-11-13 DIAGNOSIS — M5032 Other cervical disc degeneration, mid-cervical region, unspecified level: Secondary | ICD-10-CM | POA: Diagnosis not present

## 2023-11-13 DIAGNOSIS — M9901 Segmental and somatic dysfunction of cervical region: Secondary | ICD-10-CM | POA: Diagnosis not present

## 2023-11-13 DIAGNOSIS — M47814 Spondylosis without myelopathy or radiculopathy, thoracic region: Secondary | ICD-10-CM | POA: Diagnosis not present

## 2023-11-13 DIAGNOSIS — S338XXA Sprain of other parts of lumbar spine and pelvis, initial encounter: Secondary | ICD-10-CM | POA: Diagnosis not present

## 2023-11-27 ENCOUNTER — Other Ambulatory Visit (INDEPENDENT_AMBULATORY_CARE_PROVIDER_SITE_OTHER): Admitting: Pharmacist

## 2023-11-27 ENCOUNTER — Encounter: Payer: Self-pay | Admitting: Pharmacist

## 2023-11-27 DIAGNOSIS — E782 Mixed hyperlipidemia: Secondary | ICD-10-CM

## 2023-11-27 MED ORDER — REPATHA SURECLICK 140 MG/ML ~~LOC~~ SOAJ: 140.0000 mg | SUBCUTANEOUS | 3 refills | Status: AC

## 2023-11-27 NOTE — Addendum Note (Signed)
 Addended by: Daron Ellen on: 11/27/2023 01:39 PM   Modules accepted: Orders

## 2023-11-27 NOTE — Progress Notes (Signed)
 11/27/2023 Name: [name] MRN: [id number] DOB: 1945/10/29  Subjective  Chief Complaint  Patient presents with   Hyperlipidemia   Reason for visit: ?  IVAH Erica Carlson is a 78 y.o. female who presents today for an initial HLD pharmacotherapy/medication access visit.? Pertinent PMH includes HLD, HTN, CKD3, obesity, CLL, statin intolerance.  Care Team: Primary Care Provider: Clemens Curt, MD   Date of Last Visit: with PCP on 10/15/23  Recent Summary of Change: Would like to start PCSK9i if possible   HPI: Previous discussions of starting ezetimibe though at the time was cost prohibitive due to brand-only status. Intolerant of multiple preferred statin medications in the past, as below.   Reported Lipid Regimen: ?  N/A - stopped statin d/t SAMS   Lipid-lowering medications tried in the past:?  Atorvastatin  (myalgia; muscle symptoms even on lowest dose) Rosuvastatin  (myalgia, despite lowest dose) Simvastatin  (nausea, aches throughout larger leg muscles bilaterally, resolved with discontinuation)  Cardiovascular Risk Reduction History of clinical ASCVD? no The 10-year ASCVD risk score (Arnett DK, et al., 2019) is: 27.6% Family Hx: Grandmother stroke  History of heart failure? no History of hyperlipidemia? yes Current BMI: 34.1 kg/m2 (Ht 60.5 in, Wt 177 kg) Taking statin? intolerant (atorvastatin , rosuvastatin ) Taking aspirin? not indicated; Not taking   Taking SGLT-2i? no Taking GLP- 1 RA? no  Medication Access Prescription drug coverage: YES Payor: HUMANA MEDICARE / Plan: HUMANA MEDICARE CHOICE PPO  Reports that all medications are affordable though brand medications have been cost-prohibitive  Current Patient Assistance: None  _______________________________________________  Objective    Physical Examination:  Vitals:  Wt Readings from Last 3 Encounters:  11/06/23 177 lb 9.6 oz (80.6 kg)  10/15/23 175 lb (79.4 kg)  10/10/23 174 lb (78.9 kg)   BP  Readings from Last 3 Encounters:  11/06/23 138/66  10/15/23 (!) 148/77  07/19/23 (!) 143/66   Pulse Readings from Last 3 Encounters:  11/06/23 66  10/15/23 61  07/19/23 64    Labs:? Lab Results  Component Value Date   CHOL 320 (H) 10/08/2023   LDLCALC 231 (H) 10/08/2023   LDLCALC 179 (H) 10/04/2022   LDLCALC 232 (H) 10/02/2021   LDLDIRECT 204.0 09/27/2020   LDLDIRECT 204.9 02/23/2014   HDL 57.00 10/08/2023   TRIG 159.0 (H) 10/08/2023   TRIG 143.0 10/04/2022   TRIG 148.0 10/02/2021   ALT 13 11/06/2023   ALT 13 10/08/2023   AST 14 (L) 11/06/2023   AST 12 10/08/2023   Lab Results  Component Value Date   HGBA1C 5.8 10/08/2023   GLUCOSE 90 11/06/2023   CREATININE 1.17 (H) 11/06/2023   CREATININE 1.28 (H) 10/08/2023   CREATININE 1.44 (H) 07/19/2023   GFR 40.55 (L) 10/08/2023   GFR 42.83 (L) 10/04/2022   GFR 35.67 (L) 11/14/2021     Chemistry      Component Value Date/Time   NA 138 11/06/2023 1134   NA 139 02/27/2017 1002   K 4.2 11/06/2023 1134   K 4.4 02/27/2017 1002   CL 106 11/06/2023 1134   CO2 27 11/06/2023 1134   CO2 28 02/27/2017 1002   BUN 23 11/06/2023 1134   BUN 24.0 02/27/2017 1002   CREATININE 1.17 (H) 11/06/2023 1134   CREATININE 1.1 02/27/2017 1002      Component Value Date/Time   CALCIUM  9.6 11/06/2023 1134   CALCIUM  10.1 02/27/2017 1002   ALKPHOS 72 11/06/2023 1134   ALKPHOS 71 02/27/2017 1002   AST 14 (L) 11/06/2023 1134  AST 15 02/27/2017 1002   ALT 13 11/06/2023 1134   ALT 14 02/27/2017 1002   BILITOT 0.4 11/06/2023 1134   BILITOT 0.43 02/27/2017 1002      The 10-year ASCVD risk score (Arnett DK, et al., 2019) is: 27.6%  Assessment and Plan:    Hyperlipidemia (primary prevention of ASCVD): Uncontrolled on last lipid panel (10/08/23): TC 320 mg/dL, LDL 295 mg/dL, TG 188 mg/dL. LDL significantly elevated above goal <55 mg/dL (primary prevention with diabetes and multiple risk factors). Goal of <70 mg/dL very reasonable if unable to  achieve.  - Current Regimen: None.  - Previous therapies: Atorvastatin , rosuvastatin , simvastatin  (myalgia, resolved after discontinuation) - Key risk factors include: hypertension, hyperlipidemia, and BMI >30 kg/m2 - The 10-year ASCVD risk score (Arnett DK, et al., 2019) is: 27.6% indicating patient is at High risk of ASCVD event.  Start Repatha 140 mg sq q14 days Repeat lipid panel 4-12 weeks       Future Consideration: Ezetimibe: Reasonable to consider per benefits demonstrated in the IMPROVE-IT trial (though this is in addition to statin). Notably, ezetimibe unlikely to reduce LDL to a level <70 mg/dL. Will likely require more intensive therapy. PCSK9i: 15% reduction in MACE outcomes per FOURIER/ODYSSEY trials which is ideal in the setting of very high ASCVD risk as outlined above. Moreover, LDL-reduction expected with PCSK9i is greater than that of ezetimibe.  [facility]): Reasonable, though likely more challenging to get covered.   Follow Up Follow up with clinical pharmacist via phone once medication is obtained from the pharmacy Patient given direct line for questions regarding medication therapy  Future Appointments  Date Time Provider Department Center  02/07/2024 11:30 AM [facility] LAB [facility] None  02/07/2024 12:00 PM Frankie Israel, MD [facility] None  10/12/2024  3:40 PM [facility] ANNUAL WELLNESS VISIT 1 [facility] P[facility]    Daron Ellen, PharmD Clinical Pharmacist [facility] [phone]

## 2023-11-27 NOTE — Progress Notes (Signed)
 Prior Authorization:   Medication: Repatha 140 mg/mL autoinject Prior Authorization Submitted: 11/27/23 CMM Code: [id number]  This request has been approved. Coverage Starts on: 07/10/2023 12:00:00 AM Coverage Ends on: 07/08/2024 12:00:00 AM    [employer] - New enrollment 2025   Medication(s): All cholesterol medications (Repatha)   Application Status:  Approved for initial enrollment    [employer]: ID [id number] Fund: Hypercholesterolemia - Medicare Access Assistance Type: Co-pay Start Date: 10/28/2023 End Date: 10/26/2024               Rx Card: Card No.  [id number] RX BIN:  [id number] PCN:  [id number] Group:  [id number]     Daron Ellen, PharmD Clinical Pharmacist [facility] [phone]

## 2023-12-04 DIAGNOSIS — M47814 Spondylosis without myelopathy or radiculopathy, thoracic region: Secondary | ICD-10-CM | POA: Diagnosis not present

## 2023-12-04 DIAGNOSIS — M9905 Segmental and somatic dysfunction of pelvic region: Secondary | ICD-10-CM | POA: Diagnosis not present

## 2023-12-04 DIAGNOSIS — M9902 Segmental and somatic dysfunction of thoracic region: Secondary | ICD-10-CM | POA: Diagnosis not present

## 2023-12-04 DIAGNOSIS — M5032 Other cervical disc degeneration, mid-cervical region, unspecified level: Secondary | ICD-10-CM | POA: Diagnosis not present

## 2023-12-04 DIAGNOSIS — M9901 Segmental and somatic dysfunction of cervical region: Secondary | ICD-10-CM | POA: Diagnosis not present

## 2023-12-04 DIAGNOSIS — S338XXA Sprain of other parts of lumbar spine and pelvis, initial encounter: Secondary | ICD-10-CM | POA: Diagnosis not present

## 2023-12-16 ENCOUNTER — Other Ambulatory Visit: Payer: Self-pay

## 2023-12-16 NOTE — Progress Notes (Signed)
 Specialty Pharmacy Refill Coordination Note  Erica Carlson is a 78 y.o. female contacted today regarding refills of specialty medication(s) Acalabrutinib  Maleate (Calquence )   Patient requested Delivery   Delivery date: 12/18/23   Verified address: [address]  [place] [address]   Medication will be filled on 12/17/23.

## 2023-12-17 ENCOUNTER — Other Ambulatory Visit: Payer: Self-pay | Admitting: Family Medicine

## 2023-12-20 ENCOUNTER — Encounter: Payer: Self-pay | Admitting: Pharmacist

## 2023-12-20 ENCOUNTER — Other Ambulatory Visit

## 2023-12-20 NOTE — Progress Notes (Signed)
 Attempted to contact patient to follow up on recent Repatha  start to ensure she was able to get this from the pharmacy and has had no issues with her first dose.  Unable to leave message for patient due to Mailbox is full

## 2024-01-01 ENCOUNTER — Other Ambulatory Visit

## 2024-01-01 ENCOUNTER — Telehealth: Payer: Self-pay | Admitting: *Deleted

## 2024-01-01 NOTE — Telephone Encounter (Signed)
 Yes =please schedule that Thanks

## 2024-01-01 NOTE — Telephone Encounter (Signed)
-----   Message from Manuelita JONELLE Kobs sent at 01/01/2024 11:42 AM EDT ----- Erica Carlson Dr. Randeen  Patient would like to schedule a nurse visit for injection training to officially start her Repatha . Is this okay with you?

## 2024-01-01 NOTE — Progress Notes (Signed)
 Brief Telephone Documentation Reason for Call: Called patient to follow up on Repatha  start  Summary of Call: Called patient. She reports she received a letter of [place] approval though she was not contacted by her [facility] pharmacy when the medicine was ready so she was unsure if this was a medicine filled at a regular or specialty pharmacy.   Called [facility] this morning. They report that Repatha  claim went through successfully and is $0 through [employer]. They do have this in stock and will fill it today.   Patient has never self-injected before and would prefer an injection teaching visit for her first injection.   Follow Up: Patient given direct line for further questions/concerns.  Manuelita FABIENE Kobs, PharmD Clinical Pharmacist [facility] [phone]

## 2024-01-01 NOTE — Telephone Encounter (Signed)
 Please schedule nurse visit for Repatha  teaching

## 2024-01-08 ENCOUNTER — Ambulatory Visit

## 2024-01-08 DIAGNOSIS — E782 Mixed hyperlipidemia: Secondary | ICD-10-CM | POA: Diagnosis not present

## 2024-01-08 NOTE — Progress Notes (Signed)
 Patient in office today for teaching on Repatha *. Patient has brought medication with them to appointment. Printed off Instructions from [employer]. Patient reviewed instruction video on manufactures website Reviewed all instructions with patient and had repeat back to me. Instructed patient on proper cleaning of injection site and any signs of infection. Patient was able to administer medication with very little directions. Patient felt comfortable continuing in home setting. Reviewed with patient proper disposal of pen after use. Will call the office if any questions.

## 2024-01-14 DIAGNOSIS — M47814 Spondylosis without myelopathy or radiculopathy, thoracic region: Secondary | ICD-10-CM | POA: Diagnosis not present

## 2024-01-14 DIAGNOSIS — S338XXA Sprain of other parts of lumbar spine and pelvis, initial encounter: Secondary | ICD-10-CM | POA: Diagnosis not present

## 2024-01-14 DIAGNOSIS — M5032 Other cervical disc degeneration, mid-cervical region, unspecified level: Secondary | ICD-10-CM | POA: Diagnosis not present

## 2024-01-14 DIAGNOSIS — M9901 Segmental and somatic dysfunction of cervical region: Secondary | ICD-10-CM | POA: Diagnosis not present

## 2024-01-14 DIAGNOSIS — M9905 Segmental and somatic dysfunction of pelvic region: Secondary | ICD-10-CM | POA: Diagnosis not present

## 2024-01-14 DIAGNOSIS — M9902 Segmental and somatic dysfunction of thoracic region: Secondary | ICD-10-CM | POA: Diagnosis not present

## 2024-01-16 MED ORDER — REPATHA SURECLICK 140 MG/ML ~~LOC~~ SOAJ: 140.0000 mg | SUBCUTANEOUS | 2 refills | Status: DC

## 2024-01-16 NOTE — Addendum Note (Signed)
 Addended by: TRUDY DAVENE CROME on: 01/16/2024 03:50 PM   Modules accepted: Orders

## 2024-01-16 NOTE — Addendum Note (Signed)
 Addended by: SEBASTIAN DANNA GRADE on: 01/16/2024 04:10 PM   Modules accepted: Orders

## 2024-01-17 ENCOUNTER — Other Ambulatory Visit: Payer: Self-pay

## 2024-01-20 ENCOUNTER — Ambulatory Visit: Payer: Self-pay

## 2024-01-20 NOTE — Telephone Encounter (Signed)
 FYI Only or Action Required?: FYI only for provider.  Patient was last seen in primary care on 10/15/2023 by Randeen Laine LABOR, MD.  Called Nurse Triage reporting Generalized Body Aches.  Symptoms began several weeks ago.  Interventions attempted: Rest, hydration, or home remedies.  Symptoms are: lower body muscle aches mild at rest and severe when walking, mild SOB with exertion gradually worsening.  Triage Disposition: See PCP When Office is Open (Within 3 Days)  Patient/caregiver understands and will follow disposition?: Yes         Copied from CRM [id number]. Topic: Clinical - Red Word Triage >> Jan 20, 2024  3:37 PM Macario HERO wrote: Red Word that prompted transfer to Nurse Triage: Patient stated after taking the medication: Evolocumab  (REPATHA  SURECLICK) 140 MG/ML SOAJ [[id number]] her body is feeling achy from the waist down and she is not even able to walk or do her normal routine. Reason for Disposition  [1] MILD or MODERATE muscle aches or pain AND [2] taking a statin medicine (a lipid or cholesterol lowering drug)  Answer Assessment - Initial Assessment Questions 1. ONSET: When did the muscle aches or body pains start?      X2 weeks. She states it started 3-4 days after the Repatha  injection and has gradually worsening since.  2. LOCATION: What part of your body is hurting? (e.g., entire body, arms, legs)      She states from her waist down to feet, it feels like her muscles are weakened and achy.  3. SEVERITY: How bad is the pain? (Scale 1-10; or mild, moderate, severe)     She states at rest, it is a 3/10 and 9/10 if walking.  4. CAUSE: What do you think is causing the pains?     Patient states since having the Repatha  injection in June she has been having symptoms and she thinks it is a reaction from the medication. She states no changes to diet or other medications.  5. FEVER: Do you have a fever? If Yes, ask: What is your temperature, how was it measured, and   when did it start?      No.  6. OTHER SYMPTOMS: Do you have any other symptoms? (e.g., chest pain, cold or flu symptoms, rash, weakness, weight loss)     SOB with exertion I.e. climbing up the steps (denies any SOB at rest). Patient denies any rash, chest pain, cough or flu like symptoms, nasal drainage.  7. PREGNANCY: Is there any chance you are pregnant? When was your last menstrual period?     N/A.  8. TRAVEL: Have you traveled out of the country in the last month? (e.g., exposures, travel history)     No.  Protocols used: Muscle Aches and Body Pain-A-AH

## 2024-01-20 NOTE — Telephone Encounter (Signed)
 Aware, will watch for correspondence Thanks for seeing her

## 2024-01-21 ENCOUNTER — Other Ambulatory Visit: Payer: Self-pay

## 2024-01-21 NOTE — Progress Notes (Signed)
 Clinical Intervention Note  Clinical Intervention Notes: Patient reported starting Repatha . No DDIs identified with Calquence .   Clinical Intervention Outcomes: Prevention of an adverse drug event   Advertising account planner

## 2024-01-21 NOTE — Progress Notes (Signed)
 Specialty Pharmacy Refill Coordination Note  Erica Carlson is a 78 y.o. female contacted today regarding refills of specialty medication(s) Acalabrutinib  Maleate (Calquence )   Patient requested Delivery   Delivery date: 01/27/24   Verified address: [address]  [place] [address]   Medication will be filled on 01/24/24.

## 2024-01-23 ENCOUNTER — Ambulatory Visit: Admitting: Family Medicine

## 2024-01-23 ENCOUNTER — Other Ambulatory Visit: Payer: Self-pay

## 2024-01-23 ENCOUNTER — Encounter: Payer: Self-pay | Admitting: Family Medicine

## 2024-01-23 VITALS — BP 140/80 | HR 70 | Temp 97.5°F | Ht 60.5 in | Wt 177.2 lb

## 2024-01-23 DIAGNOSIS — R0602 Shortness of breath: Secondary | ICD-10-CM | POA: Diagnosis not present

## 2024-01-23 DIAGNOSIS — M791 Myalgia, unspecified site: Secondary | ICD-10-CM | POA: Diagnosis not present

## 2024-01-23 DIAGNOSIS — C9110 Chronic lymphocytic leukemia of B-cell type not having achieved remission: Secondary | ICD-10-CM

## 2024-01-23 DIAGNOSIS — R29898 Other symptoms and signs involving the musculoskeletal system: Secondary | ICD-10-CM

## 2024-01-23 NOTE — Progress Notes (Signed)
 Patient ID: Erica Carlson, female    DOB: [birth year], 78 y.o.   MRN: [id number]  This visit was conducted in person.  BP (!) 140/80   Pulse 70   Temp (!) 97.5 F (36.4 C) (Temporal)   Ht 5' 0.5 (1.537 m)   Wt 177 lb 4 oz (80.4 kg)   SpO2 97%   BMI 34.05 kg/m    CC:  Chief Complaint  Patient presents with   Fatigue    Since Repatha  Injection End of June   Muscle Aches   Shortness of Breath         Subjective:   HPI: Erica Carlson is a 78 y.o. female patient of Dr. Randeen with history of  HTN, CLL and hyperlipidemia presenting on 01/23/2024 for Fatigue (Since Repatha  Injection End of June), Muscle Aches, and Shortness of Breath (/)  She has been feeling fatigued muscle aches shortness of breath for the last month. New medications included first  Repatha  injection given in June.   She has noted  leg weakness,, pain in low back. Muscle achy in legs, no specific joint pain  Feeling somewhat tired but mainly with exeriton.    Was able   to go to gym today.  SOB when walking up steps ... This has resolved.   Gradually improving with time     She has has history of knee pain from OA, low back issues.. was doing pretty well with back and knee with exercise.   Next OV with oncology August 17  Relevant past medical, surgical, family and social history reviewed and updated as indicated. Interim medical history since our last visit reviewed. Allergies and medications reviewed and updated. Outpatient Medications Prior to Visit  Medication Sig Dispense Refill   acalabrutinib  maleate (CALQUENCE ) 100 MG tablet Take 1 tablet (100 mg total) by mouth daily. 30 tablet 5   amLODipine  (NORVASC ) 10 MG tablet TAKE 1 TABLET(10 MG) BY MOUTH DAILY 90 tablet 1   Cholecalciferol (VITAMIN D -3) 5000 units TABS Take 1 tablet by mouth daily.     Evolocumab  (REPATHA  SURECLICK) 140 MG/ML SOAJ Inject 140 mg into the skin every 14 (fourteen) days. Bill secondary to Medicare:  ID [id number],  BIN  [id number],  PCN [id number],  Group [id number] 2 mL 3   lisinopril  (ZESTRIL ) 10 MG tablet Take 0.5 tablets (5 mg total) by mouth daily. 45 tablet 3   No facility-administered medications prior to visit.     Per HPI unless specifically indicated in ROS section below Review of Systems  Constitutional:  Negative for fatigue and fever.  HENT:  Negative for congestion.   Eyes:  Negative for pain.  Respiratory:  Negative for cough and shortness of breath.   Cardiovascular:  Negative for chest pain, palpitations and leg swelling.  Gastrointestinal:  Negative for abdominal pain.  Genitourinary:  Negative for dysuria and vaginal bleeding.  Musculoskeletal:  Positive for myalgias. Negative for back pain.  Neurological:  Positive for weakness. Negative for syncope, light-headedness and headaches.  Psychiatric/Behavioral:  Negative for dysphoric mood.    Objective:  BP (!) 140/80   Pulse 70   Temp (!) 97.5 F (36.4 C) (Temporal)   Ht 5' 0.5 (1.537 m)   Wt 177 lb 4 oz (80.4 kg)   SpO2 97%   BMI 34.05 kg/m   Wt Readings from Last 3 Encounters:  01/23/24 177 lb 4 oz (80.4 kg)  11/06/23 177 lb 9.6 oz (80.6 kg)  10/15/23 175 lb (79.4  kg)      Physical Exam Constitutional:      General: She is not in acute distress.    Appearance: Normal appearance. She is well-developed. She is not ill-appearing or toxic-appearing.  HENT:     Head: Normocephalic.     Right Ear: Hearing, tympanic membrane, ear canal and external ear normal. Tympanic membrane is not erythematous, retracted or bulging.     Left Ear: Hearing, tympanic membrane, ear canal and external ear normal. Tympanic membrane is not erythematous, retracted or bulging.     Nose: No mucosal edema or rhinorrhea.     Right Sinus: No maxillary sinus tenderness or frontal sinus tenderness.     Left Sinus: No maxillary sinus tenderness or frontal sinus tenderness.     Mouth/Throat:     Pharynx: Uvula midline.  Eyes:     General: Lids are normal.  Lids are everted, no foreign bodies appreciated.     Conjunctiva/sclera: Conjunctivae normal.     Pupils: Pupils are equal, round, and reactive to light.  Neck:     Thyroid : No thyroid  mass or thyromegaly.     Vascular: No carotid bruit.     Trachea: Trachea normal.  Cardiovascular:     Rate and Rhythm: Normal rate and regular rhythm.     Pulses: Normal pulses.     Heart sounds: Normal heart sounds, S1 normal and S2 normal. No murmur heard.    No friction rub. No gallop.  Pulmonary:     Effort: Pulmonary effort is normal. No tachypnea or respiratory distress.     Breath sounds: Normal breath sounds. No decreased breath sounds, wheezing, rhonchi or rales.  Abdominal:     General: Bowel sounds are normal.     Palpations: Abdomen is soft.     Tenderness: There is no abdominal tenderness.  Musculoskeletal:     Cervical back: Normal range of motion and neck supple.  Skin:    General: Skin is warm and dry.     Findings: No rash.  Neurological:     Mental Status: She is alert and oriented to person, place, and time.     Cranial Nerves: Cranial nerves 2-12 are intact.     Sensory: Sensation is intact.     Motor: Motor function is intact.     Coordination: Coordination is intact.     Gait: Gait is intact.  Psychiatric:        Mood and Affect: Mood is not anxious or depressed.        Speech: Speech normal.        Behavior: Behavior normal. Behavior is cooperative.        Thought Content: Thought content normal.        Judgment: Judgment normal.       Results for orders placed or performed in visit on 11/06/23  Lactate dehydrogenase (LDH)   Collection Time: 11/06/23 11:33 AM  Result Value Ref Range   LDH 156 98 - 192 U/L  CMP (Cancer Center only)   Collection Time: 11/06/23 11:34 AM  Result Value Ref Range   Sodium 138 135 - 145 mmol/L   Potassium 4.2 3.5 - 5.1 mmol/L   Chloride 106 98 - 111 mmol/L   CO2 27 22 - 32 mmol/L   Glucose, Bld 90 70 - 99 mg/dL   BUN 23 8 - 23 mg/dL    Creatinine 8.82 (H) 0.44 - 1.00 mg/dL   Calcium  9.6 8.9 - 10.3 mg/dL   Total Protein 6.7  6.5 - 8.1 g/dL   Albumin 4.3 3.5 - 5.0 g/dL   AST 14 (L) 15 - 41 U/L   ALT 13 0 - 44 U/L   Alkaline Phosphatase 72 38 - 126 U/L   Total Bilirubin 0.4 0.0 - 1.2 mg/dL   GFR, Estimated 48 (L) >60 mL/min   Anion gap 5 5 - 15  CBC with Differential (Cancer Center Only)   Collection Time: 11/06/23 11:34 AM  Result Value Ref Range   WBC Count 6.8 4.0 - 10.5 K/uL   RBC 4.51 3.87 - 5.11 MIL/uL   Hemoglobin 12.0 12.0 - 15.0 g/dL   HCT 62.0 63.9 - 53.9 %   MCV 84.0 80.0 - 100.0 fL   MCH 26.6 26.0 - 34.0 pg   MCHC 31.7 30.0 - 36.0 g/dL   RDW 85.9 88.4 - 84.4 %   Platelet Count 244 150 - 400 K/uL   nRBC 0.0 0.0 - 0.2 %   Neutrophils Relative % 44 %   Neutro Abs 3.0 1.7 - 7.7 K/uL   Lymphocytes Relative 45 %   Lymphs Abs 3.1 0.7 - 4.0 K/uL   Monocytes Relative 6 %   Monocytes Absolute 0.4 0.1 - 1.0 K/uL   Eosinophils Relative 4 %   Eosinophils Absolute 0.3 0.0 - 0.5 K/uL   Basophils Relative 1 %   Basophils Absolute 0.0 0.0 - 0.1 K/uL   Immature Granulocytes 0 %   Abs Immature Granulocytes 0.02 0.00 - 0.07 K/uL    Assessment and Plan  Myalgia -     Comprehensive metabolic panel with GFR -     CBC with Differential/Platelet -     CK  Weakness of both lower extremities  SOB (shortness of breath) on exertion  CLL (chronic lymphocytic leukemia) (HCC)  Acute on send myalgia and weakness of legs that patient associates with starting after the Repatha  injection.  She did initially have some shortness of breath on exertion but this is now resolved in the first few weeks after the Repatha  injection. She has history of low back issues and knee issues but her pain and leg weakness seems to come more from muscle ache. Her strength on exam and neurologic exam within the normal limits. Given her history of CLL we will check a CBC and complete metabolic panel. Appears that postmarketing Repatha  can  have flulike symptoms have encouraged patient to drink lots of water and hold off on Repatha  injections for now.  I will forward this information to her PCP as well as the office pharmacist Morna Kobs for their additional recommendations.  No follow-ups on file.   Greig Ring, MD

## 2024-01-24 ENCOUNTER — Ambulatory Visit: Payer: Self-pay | Admitting: Family Medicine

## 2024-01-24 LAB — CBC WITH DIFFERENTIAL/PLATELET
Basophils Absolute: 0 K/uL (ref 0.0–0.1)
Basophils Relative: 0.4 % (ref 0.0–3.0)
Eosinophils Absolute: 0.6 K/uL (ref 0.0–0.7)
Eosinophils Relative: 6.7 % — ABNORMAL HIGH (ref 0.0–5.0)
HCT: 39.3 % (ref 36.0–46.0)
Hemoglobin: 12.4 g/dL (ref 12.0–15.0)
Lymphocytes Relative: 47.8 % — ABNORMAL HIGH (ref 12.0–46.0)
Lymphs Abs: 3.9 K/uL (ref 0.7–4.0)
MCHC: 31.4 g/dL (ref 30.0–36.0)
MCV: 84.3 fl (ref 78.0–100.0)
Monocytes Absolute: 0.5 K/uL (ref 0.1–1.0)
Monocytes Relative: 6.2 % (ref 3.0–12.0)
Neutro Abs: 3.2 K/uL (ref 1.4–7.7)
Neutrophils Relative %: 38.9 % — ABNORMAL LOW (ref 43.0–77.0)
Platelets: 264 K/uL (ref 150.0–400.0)
RBC: 4.67 Mil/uL (ref 3.87–5.11)
RDW: 14.1 % (ref 11.5–15.5)
WBC: 8.2 K/uL (ref 4.0–10.5)

## 2024-01-24 LAB — COMPREHENSIVE METABOLIC PANEL WITH GFR
ALT: 10 U/L (ref 0–35)
AST: 13 U/L (ref 0–37)
Albumin: 4.6 g/dL (ref 3.5–5.2)
Alkaline Phosphatase: 67 U/L (ref 39–117)
BUN: 24 mg/dL — ABNORMAL HIGH (ref 6–23)
CO2: 26 meq/L (ref 19–32)
Calcium: 9.7 mg/dL (ref 8.4–10.5)
Chloride: 104 meq/L (ref 96–112)
Creatinine, Ser: 1.26 mg/dL — ABNORMAL HIGH (ref 0.40–1.20)
GFR: 41.23 mL/min — ABNORMAL LOW (ref >60.00)
Glucose, Bld: 85 mg/dL (ref 70–99)
Potassium: 4.1 meq/L (ref 3.5–5.1)
Sodium: 140 meq/L (ref 135–145)
Total Bilirubin: 0.4 mg/dL (ref 0.2–1.2)
Total Protein: 7.1 g/dL (ref 6.0–8.3)

## 2024-01-24 LAB — CK: Total CK: 118 U/L (ref 17–177)

## 2024-01-30 ENCOUNTER — Other Ambulatory Visit (INDEPENDENT_AMBULATORY_CARE_PROVIDER_SITE_OTHER): Admitting: Pharmacist

## 2024-01-30 DIAGNOSIS — E782 Mixed hyperlipidemia: Secondary | ICD-10-CM

## 2024-01-30 NOTE — Progress Notes (Signed)
 Brief Telephone Documentation Reason for Call: Called patient to see how she has been doing since pausing Repatha   Summary of Call: Last office visit, patient noted muscle aches which she felt may have been related to Repatha . Labs unremarkable.  She notes her symptoms have not completely resolved though continue to slowly improve. Stopped Repatha  >2 weeks ago.   Patient prefers to continue to work on dietary changes and is not interested in discussing alternative medications at this time.    Follow Up: Patient given direct line for further questions/concerns.

## 2024-02-04 ENCOUNTER — Other Ambulatory Visit: Payer: Self-pay

## 2024-02-04 ENCOUNTER — Other Ambulatory Visit (HOSPITAL_COMMUNITY): Payer: Self-pay

## 2024-02-04 NOTE — Progress Notes (Signed)
 Specialty Pharmacy Ongoing Clinical Assessment Note  Erica Carlson is a 78 y.o. female who is being followed by the specialty pharmacy service for RxSp Oncology   Patient's specialty medication(s) reviewed today: Acalabrutinib  Maleate (Calquence )   Missed doses in the last 4 weeks: 0   Patient/Caregiver did not have any additional questions or concerns.   Therapeutic benefit summary: Patient is achieving benefit   Adverse events/side effects summary: No adverse events/side effects   Patient's therapy is appropriate to: Continue    Goals Addressed             This Visit's Progress    Stabilization of disease   On track    10/10/23* is on track. Patient will maintain adherence         Follow up: 6 months  Erica Carlson Specialty Pharmacist

## 2024-02-07 ENCOUNTER — Inpatient Hospital Stay: Admitting: Hematology

## 2024-02-07 ENCOUNTER — Inpatient Hospital Stay

## 2024-02-14 ENCOUNTER — Other Ambulatory Visit: Payer: Self-pay

## 2024-02-14 DIAGNOSIS — C9110 Chronic lymphocytic leukemia of B-cell type not having achieved remission: Secondary | ICD-10-CM

## 2024-02-17 ENCOUNTER — Inpatient Hospital Stay: Admitting: Hematology

## 2024-02-17 ENCOUNTER — Inpatient Hospital Stay: Attending: Hematology

## 2024-02-17 VITALS — BP 150/71 | HR 68 | Temp 97.0°F | Resp 18 | Wt 167.0 lb

## 2024-02-17 DIAGNOSIS — C9110 Chronic lymphocytic leukemia of B-cell type not having achieved remission: Secondary | ICD-10-CM

## 2024-02-17 DIAGNOSIS — Z79899 Other long term (current) drug therapy: Secondary | ICD-10-CM | POA: Insufficient documentation

## 2024-02-17 LAB — CBC WITH DIFFERENTIAL (CANCER CENTER ONLY)
Abs Immature Granulocytes: 0.02 K/uL (ref 0.00–0.07)
Basophils Absolute: 0 K/uL (ref 0.0–0.1)
Basophils Relative: 0 %
Eosinophils Absolute: 0.3 K/uL (ref 0.0–0.5)
Eosinophils Relative: 5 %
HCT: 38.1 % (ref 36.0–46.0)
Hemoglobin: 11.8 g/dL — ABNORMAL LOW (ref 12.0–15.0)
Immature Granulocytes: 0 %
Lymphocytes Relative: 41 %
Lymphs Abs: 2.7 K/uL (ref 0.7–4.0)
MCH: 26.6 pg (ref 26.0–34.0)
MCHC: 31 g/dL (ref 30.0–36.0)
MCV: 85.8 fL (ref 80.0–100.0)
Monocytes Absolute: 0.4 K/uL (ref 0.1–1.0)
Monocytes Relative: 6 %
Neutro Abs: 3.2 K/uL (ref 1.7–7.7)
Neutrophils Relative %: 48 %
Platelet Count: 263 K/uL (ref 150–400)
RBC: 4.44 MIL/uL (ref 3.87–5.11)
RDW: 13.7 % (ref 11.5–15.5)
WBC Count: 6.7 K/uL (ref 4.0–10.5)
nRBC: 0 % (ref 0.0–0.2)

## 2024-02-17 LAB — CMP (CANCER CENTER ONLY)
ALT: 11 U/L (ref 0–44)
AST: 10 U/L — ABNORMAL LOW (ref 15–41)
Albumin: 4.2 g/dL (ref 3.5–5.0)
Alkaline Phosphatase: 64 U/L (ref 38–126)
Anion gap: 4 — ABNORMAL LOW (ref 5–15)
BUN: 23 mg/dL (ref 8–23)
CO2: 29 mmol/L (ref 22–32)
Calcium: 9.7 mg/dL (ref 8.9–10.3)
Chloride: 107 mmol/L (ref 98–111)
Creatinine: 1.18 mg/dL — ABNORMAL HIGH (ref 0.44–1.00)
GFR, Estimated: 48 mL/min — ABNORMAL LOW (ref >60)
Glucose, Bld: 92 mg/dL (ref 70–99)
Potassium: 4.2 mmol/L (ref 3.5–5.1)
Sodium: 140 mmol/L (ref 135–145)
Total Bilirubin: 0.4 mg/dL (ref 0.0–1.2)
Total Protein: 7 g/dL (ref 6.5–8.1)

## 2024-02-17 LAB — LACTATE DEHYDROGENASE: LDH: 143 U/L (ref 98–192)

## 2024-02-17 NOTE — Progress Notes (Signed)
 HEMATOLOGY/ONCOLOGY CLINIC NOTE  Date of Service: 02/17/24   Patient Care Team: Tower, Laine LABOR, MD as PCP - General (Family Medicine) Rachell Zachary Lot, OD as Consulting Physician (Optometry) Onesimo, Emaline Brink, MD as Consulting Physician (Hematology)  CHIEF COMPLAINTS/PURPOSE OF CONSULTATION:  Follow-up for continued evaluation and management of CLL  HISTORY OF PRESENTING ILLNESS:  Please see previous notes for details on initial presentation.  INTERVAL HISTORY:   Erica Carlson is a 78 y.o. 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  Patient was last seen in our clinic in April 2025. She notes no acute new concerns from a CLL standpoint.  No new lumps or bumps no fevers no chills no night sweats no unexpected weight loss.  Notes some bogginess of the skin over the chest but no pain or discomfort.  This has been chronic. She has had issues with chronic right knee pain and right shoulder pain which tend to get more bothersome during the cold. She also had her dose of Repatha  about 3 months ago which she noted because pretty significant reactions with pain and discomfort below the umbilicus and decreased appetite.  She is off the Repatha  now with resolution of her symptoms. No issues with tolerating the Calquence .  Normal bleeding or bruising.  No chest pain or palpitations. Lab results from today were discussed with her in details.  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HCC)    right axillary    Pneumonia     SURGICAL HISTORY: Past Surgical History:  Procedure Laterality Date   ABDOMINAL HYSTERECTOMY     AXILLARY LYMPH NODE BIOPSY Right 02/07/2016   back injections     BREAST SURGERY     COLONOSCOPY  2009   EYE SURGERY     retinal tear repair   KNEE  SURGERY     torn ALC per pt    SOCIAL HISTORY: Social History   Socioeconomic History   Marital status: Widowed    Spouse name: n/a   Number of children: 6   Years of education: 14   Highest education level: Not on file  Occupational History   Occupation: self-employed    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   Her adult children live nearby.   Social Drivers of Corporate investment banker Strain: Low Risk  (10/10/2023)   Overall Financial Resource Strain (CARDIA)    Difficulty of Paying Living Expenses: Not hard at all  Food Insecurity: No Food Insecurity (10/10/2023)   Hunger Vital Sign    Worried About Running Out of Food in the Last Year: Never true    Ran Out of Food in the Last Year: Never true  Transportation Needs: No Transportation Needs (10/10/2023)   PRAPARE - Administrator, Civil Service (Medical): No  Lack of Transportation (Non-Medical): No  Physical Activity: Insufficiently Active (10/10/2023)   Exercise Vital Sign    Days of Exercise per Week: 2 days    Minutes of Exercise per Session: 60 min  Stress: No Stress Concern Present (10/10/2023)   [employer] of Occupational Health - Occupational Stress Questionnaire    Feeling of Stress : Not at all  Social Connections: Moderately Integrated (10/10/2023)   Social Connection and Isolation Panel    Frequency of Communication with Friends and Family: More than three times a week    Frequency of Social Gatherings with Friends and Family: Three times a week    Attends Religious Services: More than 4 times per year    Active Member of Clubs or Organizations:  No    Attends Banker Meetings: Never    Marital Status: Married  Catering manager Violence: Not At Risk (10/10/2023)   Humiliation, Afraid, Rape, and Kick questionnaire    Fear of Current or Ex-Partner: No    Emotionally Abused: No    Physically Abused: No    Sexually Abused: No    FAMILY HISTORY: Family History  Problem Relation Age of Onset   Breast cancer Sister    Hyperlipidemia Son    Schizophrenia Mother    Colon cancer Neg Hx    Colon polyps Neg Hx    Esophageal cancer Neg Hx    Rectal cancer Neg Hx    Stomach cancer Neg Hx     ALLERGIES:  is allergic to crestor  [rosuvastatin ], hctz [hydrochlorothiazide ], and lipitor [atorvastatin ].  MEDICATIONS:  Current Outpatient Medications  Medication Sig Dispense Refill   acalabrutinib  maleate (CALQUENCE ) 100 MG tablet Take 1 tablet (100 mg total) by mouth daily. 30 tablet 5   amLODipine  (NORVASC ) 10 MG tablet TAKE 1 TABLET(10 MG) BY MOUTH DAILY 90 tablet 1   Cholecalciferol (VITAMIN D -3) 5000 units TABS Take 1 tablet by mouth daily.     Evolocumab  (REPATHA  SURECLICK) 140 MG/ML SOAJ Inject 140 mg into the skin every 14 (fourteen) days. Bill secondary to Medicare:  ID [id number],  BIN [id number],  PCN [id number],  Group [id number] 2 mL 3   lisinopril  (ZESTRIL ) 10 MG tablet Take 0.5 tablets (5 mg total) by mouth daily. 45 tablet 3   No current facility-administered medications for this visit.    REVIEW OF SYSTEMS:    10 Point review of Systems was done is negative except as noted above.  PHYSICAL EXAMINATION: .BP (!) 150/71 Comment: re-check  Pulse 68   Temp (!) 97 F (36.1 C)   Resp 18   Wt 167 lb (75.8 kg)   SpO2 98%   BMI 32.08 kg/m  NAD GENERAL:alert, in no acute distress and comfortable SKIN: no acute rashes, no significant lesions EYES: conjunctiva are pink and non-injected, sclera anicteric OROPHARYNX: MMM, no exudates, no oropharyngeal erythema or ulceration NECK: supple, no JVD LYMPH:  no palpable  lymphadenopathy in the cervical, axillary or inguinal regions LUNGS: clear to auscultation b/l with normal respiratory effort HEART: regular rate & rhythm ABDOMEN:  normoactive bowel sounds , non tender, not distended. Extremity: no pedal edema PSYCH: alert & oriented x 3 with fluent speech NEURO: no focal motor/sensory deficits   LABORATORY DATA:  I have reviewed the data as listed  .    Latest Ref Rng & Units 02/17/2024   11:25 AM 01/23/2024    2:56 PM 11/06/2023   11:34 AM  CBC  WBC 4.0 - 10.5 K/uL 6.7  8.2  6.8   Hemoglobin 12.0 - 15.0 g/dL 88.1  87.5  87.9   Hematocrit 36.0 - 46.0 % 38.1  39.3  37.9   Platelets 150 - 400 K/uL 263  264.0  244     .    Latest Ref Rng & Units 02/17/2024   11:25 AM 01/23/2024    2:56 PM 11/06/2023   11:34 AM  CMP  Glucose 70 - 99 mg/dL 92  85  90   BUN 8 - 23 mg/dL 23  24  23    Creatinine 0.44 - 1.00 mg/dL 8.81  8.73  8.82   Sodium 135 - 145 mmol/L 140  140  138   Potassium 3.5 - 5.1 mmol/L 4.2  4.1  4.2   Chloride 98 - 111 mmol/L 107  104  106   CO2 22 - 32 mmol/L 29  26  27    Calcium  8.9 - 10.3 mg/dL 9.7  9.7  9.6   Total Protein 6.5 - 8.1 g/dL 7.0  7.1  6.7   Total Bilirubin 0.0 - 1.2 mg/dL 0.4  0.4  0.4   Alkaline Phos 38 - 126 U/L 64  67  72   AST 15 - 41 U/L 10  13  14    ALT 0 - 44 U/L 11  10  13     . Lab Results  Component Value Date   LDH 143 02/17/2024     09/22/18 Right Axillary LN Biopsy:    07/31/18 Molecular Pathology:     02/21/16 Biopsy:    RADIOGRAPHIC STUDIES: I have personally reviewed the radiological images as listed and agreed with the findings in the report. No results found.   ASSESSMENT & PLAN:   78 y.o. female with  1. Chronic Lymphocytic Leukemia  02/21/16 Right Axillary LN biopsy revealed SLL, the initial diagnosis. 07/31/18 FISH CLL Prognostic Panel revealed:52.0% cells with 11q deletion Labs upon initial presentation from 09/22/18, WBC at 16.8k, HGB at 10.9, PLT at 272k. ANC at 1.2k,  Lymphs at 11.3k, Monocytes at 4.1k. 09/22/18 Right Axillary LN Biopsy revealed SLL, ruling out a transformation event  08/15/18 PET/CT revealed Bulky lymphadenopathy in the neck, chest, abdomen, and pelvis demonstrating low level hypermetabolism throughout. 9 mm right thyroid  nodule is hypermetabolic. Thyroid  ultrasound recommended to further evaluate. Mucosal thickening with air-fluid level in the left maxillary sinus. 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  08/12/2019 PET/CT scan ([id number]) revealed disease progression in neck, chest, abdomen, pelvis and interval development of pulmonary lesions  03/23/2020 CT C/A/P ([id number]) ([id number]) revealed. 1. Significant positive response to therapy. No new or progressive disease.  PLAN:  -Discussed her lab results from today at 02/17/2024 - CBC and CMP are stable LDH within normal limits Patient has no lab or clinical evidence of progression of her CLL at this time. She has been tolerating her Calquence  without any notable toxicities at 100 mg p.o. daily. - She will continue her Calquence  at the current dose. -For considerations of internal surgery, she should hold Calquence  for a 2 weeks before and after surgery -for considerations of knee surgery, the risk of bleeding is fairly low.  Would recommend hold Calquence  for about 1 week prior to surgery and restart it about 1 week after surgery.  -answered all of patient's questions in detail -patient shall return to clinic with labs in 3-4 months   FOLLOW UP: RTC with Dr Onesimo with labs in 3-4 months  The total time spent in the appointment was 20 minutes*.  All  of the patient's questions were answered with apparent satisfaction. The patient knows to call the clinic with any problems, questions or concerns.   Emaline Saran MD MS AAHIVMS [facility] Hematology/Oncology Physician [facility]  .*Total Encounter Time as defined by the Centers for Medicare and  Medicaid Services includes, in addition to the face-to-face time of a patient visit (documented in the note above) non-face-to-face time: obtaining and reviewing outside history, ordering and reviewing medications, tests or procedures, care coordination (communications with other health care professionals or caregivers) and documentation in the medical record.

## 2024-02-18 ENCOUNTER — Telehealth: Payer: Self-pay | Admitting: Hematology

## 2024-02-18 ENCOUNTER — Other Ambulatory Visit: Payer: Self-pay

## 2024-02-18 ENCOUNTER — Other Ambulatory Visit: Payer: Self-pay | Admitting: Pharmacy Technician

## 2024-02-18 NOTE — Telephone Encounter (Signed)
 Erica Carlson has been made aware of her 3 month follow up with Dr. Onesimo.

## 2024-02-18 NOTE — Progress Notes (Signed)
 Specialty Pharmacy Refill Coordination Note  Erica Carlson is a 78 y.o. female contacted today regarding refills of specialty medication(s) Acalabrutinib Maleate (Calquence)   Patient requested Delivery   Delivery date: 02/26/24   Verified address: [address]   [place] [address]   Medication will be filled on 02/25/24.

## 2024-02-25 DIAGNOSIS — S338XXA Sprain of other parts of lumbar spine and pelvis, initial encounter: Secondary | ICD-10-CM | POA: Diagnosis not present

## 2024-02-25 DIAGNOSIS — M5032 Other cervical disc degeneration, mid-cervical region, unspecified level: Secondary | ICD-10-CM | POA: Diagnosis not present

## 2024-02-25 DIAGNOSIS — M47814 Spondylosis without myelopathy or radiculopathy, thoracic region: Secondary | ICD-10-CM | POA: Diagnosis not present

## 2024-02-25 DIAGNOSIS — M9901 Segmental and somatic dysfunction of cervical region: Secondary | ICD-10-CM | POA: Diagnosis not present

## 2024-02-25 DIAGNOSIS — M9902 Segmental and somatic dysfunction of thoracic region: Secondary | ICD-10-CM | POA: Diagnosis not present

## 2024-02-25 DIAGNOSIS — M9905 Segmental and somatic dysfunction of pelvic region: Secondary | ICD-10-CM | POA: Diagnosis not present

## 2024-03-17 ENCOUNTER — Other Ambulatory Visit (INDEPENDENT_AMBULATORY_CARE_PROVIDER_SITE_OTHER): Payer: Self-pay

## 2024-03-17 ENCOUNTER — Telehealth: Payer: Self-pay

## 2024-03-17 ENCOUNTER — Ambulatory Visit: Admitting: Orthopaedic Surgery

## 2024-03-17 VITALS — Ht 61.5 in | Wt 176.8 lb

## 2024-03-17 DIAGNOSIS — M1711 Unilateral primary osteoarthritis, right knee: Secondary | ICD-10-CM | POA: Insufficient documentation

## 2024-03-17 NOTE — Telephone Encounter (Signed)
 Patient given surgical clearance form for oncologist. Aware that we must receive clearance form back before being able to proceed with scheduling surgery.

## 2024-03-17 NOTE — Progress Notes (Signed)
 Office Visit Note   Patient: Erica Carlson           Date of Birth: 08/28/1945           MRN: [id number] Visit Date: 03/17/2024              Requested by: Tower, Laine LABOR, MD [address] [place],  [place] [address] PCP: Randeen Laine LABOR, MD   Assessment & Plan: Visit Diagnoses:  1. Primary osteoarthritis of right knee     Plan: History of Present Illness Erica Carlson is a 78 year old female who presents with worsening right knee pain.  Over the past ten months, she has experienced progressive worsening of right knee pain, significantly impacting her ability to walk. She continues strength training and muscle building at the gym, which has somewhat aided her mobility. The pain is persistent, with uncertainty if it originates from the knee or is related to back issues. Back pain primarily affects the side of her calf, not the knee itself, but she now experiences pain in multiple areas around the knee. She has previously received injections for knee pain but has not continued them recently. She mentions a grinding sensation in the knee. No history of blood clots or allergies to nickel. She does not smoke or drink alcohol.  Physical Exam MUSCULOSKELETAL: Right knee grinding, bone on bone with range of motion.  Collaterals and cruciates are stable.  Medial joint line tenderness.  Normal motor and sensory function.  Assessment and Plan Right knee osteoarthritis Chronic osteoarthritis with bone-on-bone changes confirmed by x-ray. Persistent pain affects gait. Previous interventions ineffective. Candidate for total knee arthroplasty. - Discussed temporary relief from medications, injections, and braces versus lasting relief from knee replacement. - Emphasized surgery benefits while relatively young. - Explained surgical process, hospital stay, and postoperative therapy. - Addressed concerns about nickel allergies and blood clots, both negative. - Discussed anticipated  postoperative pain and recovery. - Obtain surgical clearance from primary care and oncologist. - Schedule total knee arthroplasty after second week of October. - Provide reading material on knee replacement. - Arrange two months of postoperative physical therapy.  Impression is severe right knee degenerative joint disease secondary to Osteoarthritis.  Patient has attempted conservative treatment for at least 6 consecutive weeks within the past 12 weeks, including but not limited to physical therapy, home exercise program, NSAIDs, activity modification, and/or corticosteroid injections. Despite these efforts, symptoms have not improved or have worsened. Conservative measures have been deemed unsuccessful at this time. After a detailed discussion covering diagnosis and treatment options--including the risks, benefits, alternatives, and potential complications of surgical and nonsurgical management--the patient elected to proceed with surgery  Anticoagulants: No antithrombotic Postop anticoagulation: Eliquis Diabetic: No  Nickel allergy: No Prior DVT/PE: No Tobacco use: No Clearances needed for surgery: PCP and oncologist Anticipated discharge dispo: Home   Follow-Up Instructions: No follow-ups on file.   Orders:  Orders Placed This Encounter  Procedures   XR KNEE 3 VIEW RIGHT   No orders of the defined types were placed in this encounter.   Subjective: Chief Complaint  Patient presents with   Right Knee - Pain    HPI  Review of Systems  Constitutional: Negative.   HENT: Negative.    Eyes: Negative.   Respiratory: Negative.    Cardiovascular: Negative.   Endocrine: Negative.   Musculoskeletal: Negative.   Neurological: Negative.   Hematological: Negative.   Psychiatric/Behavioral: Negative.    All other systems reviewed  and are negative.    Objective: Vital Signs: Ht 5' 1.5 (1.562 m)   Wt 176 lb 12.8 oz (80.2 kg)   BMI 32.87 kg/m   Physical Exam Vitals and  nursing note reviewed.  Constitutional:      Appearance: She is well-developed.  HENT:     Head: Atraumatic.     Nose: Nose normal.  Eyes:     Extraocular Movements: Extraocular movements intact.  Cardiovascular:     Pulses: Normal pulses.  Pulmonary:     Effort: Pulmonary effort is normal.  Abdominal:     Palpations: Abdomen is soft.  Musculoskeletal:     Cervical back: Neck supple.  Skin:    General: Skin is warm.     Capillary Refill: Capillary refill takes less than 2 seconds.  Neurological:     Mental Status: She is alert. Mental status is at baseline.  Psychiatric:        Behavior: Behavior normal.        Thought Content: Thought content normal.        Judgment: Judgment normal.     Ortho Exam  Specialty Comments:  No specialty comments available.  Imaging: XR KNEE 3 VIEW RIGHT Result Date: 03/17/2024 X-rays demonstrate severe tricompartmental osteoarthritis.  Bone-on-bone joint space narrowing.  Kellgren-Lawrence stage IV    PMFS History: Patient Active Problem List   Diagnosis Date Noted   Primary osteoarthritis of right knee 03/17/2024   Prediabetes 10/06/2023   Statin intolerance 10/12/2022   CKD (chronic kidney disease) stage 3, GFR 30-59 ml/min (HCC) 11/23/2021   Encounter for screening mammogram for breast cancer 10/10/2021   Elevated serum creatinine 10/10/2021   Hand pain 10/04/2020   CLL (chronic lymphocytic leukemia) (HCC) 08/31/2019   Counseling regarding advance care planning and goals of care 08/31/2019   Need for hepatitis C screening test 06/26/2016   Obesity (BMI 30-39.9) 10/01/2013   Encounter for Medicare annual wellness exam 09/30/2013   Estrogen deficiency 09/17/2012   Herpes zoster without complication 07/09/2011   Special screening for malignant neoplasms, colon 12/15/2010   Routine general medical examination at a health care facility 12/08/2010   OSTEOARTHRITIS, KNEE, RIGHT 10/11/2008   Internal hemorrhoids 09/02/2008    Hypertension 04/28/2008   PNEUMONIA, HX OF 03/03/2008   Hyperlipidemia 02/26/2008   Past Medical History:  Diagnosis Date   Allergy    spring   Arthritis    osteo arthritis of right knee   Cataract    forming   Hyperlipidemia    Hypertension    Lymphoma, small-cell (HCC)    right axillary    Pneumonia     Family History  Problem Relation Age of Onset   Breast cancer Sister    Hyperlipidemia Son    Schizophrenia Mother    Colon cancer Neg Hx    Colon polyps Neg Hx    Esophageal cancer Neg Hx    Rectal cancer Neg Hx    Stomach cancer Neg Hx     Past Surgical History:  Procedure Laterality Date   ABDOMINAL HYSTERECTOMY     AXILLARY LYMPH NODE BIOPSY Right 02/07/2016   back injections     BREAST SURGERY     COLONOSCOPY  2009   EYE SURGERY     retinal tear repair   KNEE SURGERY     torn AL[facility] per pt   Social History   Occupational History   Occupation: self-employed    Associate Professor: [employer]    Comment: alterations and  design  Tobacco Use   Smoking status: Never   Smokeless tobacco: Never  Vaping Use   Vaping status: Never Used  Substance and Sexual Activity   Alcohol use: No    Alcohol/week: 0.0 standard drinks of alcohol   Drug use: No   Sexual activity: Never

## 2024-03-24 ENCOUNTER — Other Ambulatory Visit: Payer: Self-pay

## 2024-03-24 NOTE — Progress Notes (Signed)
 Specialty Pharmacy Refill Coordination Note  Erica Carlson is a 78 y.o. female contacted today regarding refills of specialty medication(s) Acalabrutinib  Maleate (Calquence )   Patient requested Delivery   Delivery date: 04/02/24   Verified address: [address]   [place] [address]   Medication will be filled on 09.24.25.

## 2024-04-01 ENCOUNTER — Other Ambulatory Visit: Payer: Self-pay

## 2024-04-23 ENCOUNTER — Ambulatory Visit (INDEPENDENT_AMBULATORY_CARE_PROVIDER_SITE_OTHER): Admitting: *Deleted

## 2024-04-23 DIAGNOSIS — Z23 Encounter for immunization: Secondary | ICD-10-CM

## 2024-04-23 NOTE — Progress Notes (Signed)
 Per orders of Dr. Arlyss Solian, injection of influenza vaccine given by Manuelita JAYSON Frost in left deltoid. Patient tolerated injection well.

## 2024-04-28 ENCOUNTER — Other Ambulatory Visit: Payer: Self-pay | Admitting: Hematology

## 2024-04-28 ENCOUNTER — Other Ambulatory Visit: Payer: Self-pay

## 2024-04-28 DIAGNOSIS — C9110 Chronic lymphocytic leukemia of B-cell type not having achieved remission: Secondary | ICD-10-CM

## 2024-04-28 MED ORDER — CALQUENCE 100 MG PO TABS
100.0000 mg | ORAL_TABLET | Freq: Every day | ORAL | 5 refills | Status: AC
  Filled 2024-04-28: qty 30, 30d supply, fill #0
  Filled 2024-06-02: qty 30, 30d supply, fill #1
  Filled 2024-07-08 – 2024-08-04 (×3): qty 30, 30d supply, fill #2

## 2024-04-28 NOTE — Progress Notes (Signed)
 Specialty Pharmacy Refill Coordination Note  Erica Carlson is a 78 y.o. female contacted today regarding refills of specialty medication(s) Acalabrutinib  Maleate (Calquence )   Patient requested Delivery   Delivery date: 05/07/24   Verified address: [address]   [place] [address]   Medication will be filled on 05/06/24, pending refill approval.   This fill date is pending response to refill request from provider. Patient is aware and if they have not received fill by intended date they must follow up with pharmacy.

## 2024-05-05 ENCOUNTER — Other Ambulatory Visit: Payer: Self-pay

## 2024-05-05 ENCOUNTER — Other Ambulatory Visit (HOSPITAL_COMMUNITY): Payer: Self-pay

## 2024-05-05 ENCOUNTER — Telehealth: Payer: Self-pay

## 2024-05-05 NOTE — Telephone Encounter (Signed)
 Oral Oncology Patient Advocate Encounter  Was successful in securing patient a $8000 grant from [facility] to provide copayment coverage for Calquence .  This will keep the out of pocket expense at $0.     [employer] ID: [id number]   The billing information is as follows and has been shared with [facility].    RxBin: [id number] PCN: [id number] Member ID: [id number] Group ID: [id number] Dates of Eligibility: 05/29/24 through 05/28/25  Fund:  CLL   Lucie Lamer, CPhT [place]  [facility] Oncology Pharmacy Patient Advocate Specialist II THER[place] Phone: [phone]  Fax: [phone] [email or web address]

## 2024-05-11 ENCOUNTER — Encounter: Payer: Self-pay | Admitting: Radiology

## 2024-05-26 ENCOUNTER — Other Ambulatory Visit: Payer: Self-pay

## 2024-05-26 DIAGNOSIS — C9110 Chronic lymphocytic leukemia of B-cell type not having achieved remission: Secondary | ICD-10-CM

## 2024-05-27 ENCOUNTER — Inpatient Hospital Stay: Attending: Hematology

## 2024-05-27 ENCOUNTER — Inpatient Hospital Stay: Admitting: Hematology

## 2024-05-27 VITALS — BP 142/79 | HR 65 | Temp 97.3°F | Resp 18 | Wt 177.8 lb

## 2024-05-27 DIAGNOSIS — C9110 Chronic lymphocytic leukemia of B-cell type not having achieved remission: Secondary | ICD-10-CM | POA: Diagnosis not present

## 2024-05-27 DIAGNOSIS — Z79899 Other long term (current) drug therapy: Secondary | ICD-10-CM | POA: Diagnosis not present

## 2024-05-27 DIAGNOSIS — Z8572 Personal history of non-Hodgkin lymphomas: Secondary | ICD-10-CM | POA: Insufficient documentation

## 2024-05-27 DIAGNOSIS — N189 Chronic kidney disease, unspecified: Secondary | ICD-10-CM | POA: Diagnosis not present

## 2024-05-27 DIAGNOSIS — Z803 Family history of malignant neoplasm of breast: Secondary | ICD-10-CM | POA: Diagnosis not present

## 2024-05-27 LAB — CBC WITH DIFFERENTIAL (CANCER CENTER ONLY)
Abs Immature Granulocytes: 0.03 K/uL (ref 0.00–0.07)
Basophils Absolute: 0 K/uL (ref 0.0–0.1)
Basophils Relative: 0 %
Eosinophils Absolute: 0.4 K/uL (ref 0.0–0.5)
Eosinophils Relative: 5 %
HCT: 41.1 % (ref 36.0–46.0)
Hemoglobin: 12.9 g/dL (ref 12.0–15.0)
Immature Granulocytes: 0 %
Lymphocytes Relative: 49 %
Lymphs Abs: 3.5 K/uL (ref 0.7–4.0)
MCH: 26.4 pg (ref 26.0–34.0)
MCHC: 31.4 g/dL (ref 30.0–36.0)
MCV: 84 fL (ref 80.0–100.0)
Monocytes Absolute: 0.5 K/uL (ref 0.1–1.0)
Monocytes Relative: 7 %
Neutro Abs: 2.8 K/uL (ref 1.7–7.7)
Neutrophils Relative %: 39 %
Platelet Count: 236 K/uL (ref 150–400)
RBC: 4.89 MIL/uL (ref 3.87–5.11)
RDW: 13.8 % (ref 11.5–15.5)
WBC Count: 7.2 K/uL (ref 4.0–10.5)
nRBC: 0 % (ref 0.0–0.2)

## 2024-05-27 LAB — LACTATE DEHYDROGENASE: LDH: 162 U/L (ref 105–235)

## 2024-05-27 LAB — CMP (CANCER CENTER ONLY)
ALT: 10 U/L (ref 0–44)
AST: 16 U/L (ref 15–41)
Albumin: 4.3 g/dL (ref 3.5–5.0)
Alkaline Phosphatase: 80 U/L (ref 38–126)
Anion gap: 10 (ref 5–15)
BUN: 21 mg/dL (ref 8–23)
CO2: 25 mmol/L (ref 22–32)
Calcium: 9.8 mg/dL (ref 8.9–10.3)
Chloride: 105 mmol/L (ref 98–111)
Creatinine: 1.12 mg/dL — ABNORMAL HIGH (ref 0.44–1.00)
GFR, Estimated: 50 mL/min — ABNORMAL LOW (ref >60)
Glucose, Bld: 95 mg/dL (ref 70–99)
Potassium: 4.2 mmol/L (ref 3.5–5.1)
Sodium: 140 mmol/L (ref 135–145)
Total Bilirubin: 0.3 mg/dL (ref 0.0–1.2)
Total Protein: 7.1 g/dL (ref 6.5–8.1)

## 2024-05-27 NOTE — Progress Notes (Signed)
 HEMATOLOGY ONCOLOGY PROGRESS NOTE  Date of service: 05/27/2024  Patient Care Team: Tower, Laine LABOR, MD as PCP - General (Family Medicine) Rachell Zachary Lot, OD as Consulting Physician (Optometry) Onesimo Emaline Brink, MD as Consulting Physician (Hematology)  CHIEF COMPLAINT/PURPOSE OF CONSULTATION: Follow-up for continued evaluation and management of CLL  HISTORY OF PRESENTING ILLNESS:  See previous notes for details on initial presentation  SUMMARY OF ONCOLOGIC HISTORY: Oncology History  Lymphoma, small lymphocytic (HCC) (Resolved)  02/21/2016 Initial Diagnosis   Right axillary lymph node biopsy: 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 female who is here today for continued evaluation and management of CLL.   she was last seen by me on 02/17/2024; at the time she mentioned experiencing chronic right knee and shoulder pain.  Today, she says that she changed her mind about undergoing knee replacement, and instead will receive intraarticular injections for management after seeing another Orthopedist. She is ambulating well enough, but is still experiencing some pain, exacerbated by back pain.  Denies any fevers/chills, drenching night sweats, new lumps/bumps, leg swelling, bleeding issues (nose bleeds, gum bleeds, abnormal/spontaneous bruising), or diarrhea.  Reports she is tolerating her Calquence . She notes that she could not tolerate Repatha  due to lower extremity myalgias.  States that she is up to date with most age-recommended vaccines, declines Covid-19.  REVIEW OF SYSTEMS:   10 Point review of systems of done and is negative except as noted above.  MEDICAL HISTORY Past  Medical History:  Diagnosis Date   Allergy    spring   Arthritis    osteo arthritis of right knee   Cataract    forming   Hyperlipidemia    Hypertension    Lymphoma, small-cell (HCC)    right axillary    Pneumonia    IMMUNIZATION HISTORY: Immunization History  Administered Date(s) Administered   Fluad Quad(high Dose 65+) 05/18/2020, 06/09/2021, 06/13/2022   Fluad Trivalent(High Dose 65+) 04/30/2023   INFLUENZA, HIGH DOSE SEASONAL PF 04/23/2024   Influenza Split 04/23/2012   Influenza,inj,Quad PF,6+ Mos 04/29/2013, 07/04/2016, 07/08/2017, 04/21/2019   PFIZER(Purple Top)SARS-COV-2 Vaccination 08/30/2019, 09/23/2019, 03/24/2020   Pneumococcal Conjugate-13 07/04/2016   Pneumococcal Polysaccharide-23 12/15/2010, 07/08/2017   Td 03/03/2009   Tdap 08/25/2014   Zoster Recombinant(Shingrix) 09/02/2023, 11/12/2023   SURGICAL HISTORY Past Surgical History:  Procedure Laterality Date   ABDOMINAL HYSTERECTOMY     AXILLARY LYMPH NODE BIOPSY Right 02/07/2016   back injections     BREAST SURGERY     COLONOSCOPY  2009   EYE SURGERY     retinal tear repair   KNEE SURGERY     torn ALC per pt    SOCIAL HISTORY Social History   Tobacco Use   Smoking status: Never   Smokeless tobacco: Never  Vaping Use   Vaping status: Never Used  Substance Use Topics   Alcohol use: No    Alcohol/week: 0.0 standard drinks of alcohol   Drug use: No    Social History   Social History Narrative   Lives alone.   Her adult children live nearby.    SOCIAL DRIVERS OF HEALTH SDOH Screenings   Food Insecurity: No Food Insecurity (10/10/2023)  Housing: High Risk (10/10/2023)  Transportation Needs: No Transportation Needs (10/10/2023)  Utilities: Not At Risk (10/10/2023)  Alcohol Screen: Low Risk  (10/10/2023)  Depression (PHQ2-9): Low Risk  (10/15/2023)  Financial Resource Strain: Low Risk  (10/10/2023)  Physical Activity: Insufficiently Active (10/10/2023)  Social Connections: Moderately Integrated  (10/10/2023)  Stress: No Stress Concern Present (10/10/2023)  Tobacco Use: Low Risk  (01/23/2024)  Health Literacy: Adequate Health Literacy (10/10/2023)     FAMILY HISTORY Family History  Problem Relation Age of Onset   Breast cancer Sister    Hyperlipidemia Son    Schizophrenia Mother    Colon cancer Neg Hx    Colon polyps Neg Hx    Esophageal cancer Neg Hx    Rectal cancer Neg Hx    Stomach cancer Neg Hx      ALLERGIES: is allergic to crestor  [rosuvastatin ], hctz [hydrochlorothiazide ], and lipitor [atorvastatin ].  MEDICATIONS  Current Outpatient Medications  Medication Sig Dispense Refill   acalabrutinib  maleate (CALQUENCE ) 100 MG tablet Take 1 tablet (100 mg total) by mouth daily. 30 tablet 5   amLODipine  (NORVASC ) 10 MG tablet TAKE 1 TABLET(10 MG) BY MOUTH DAILY 90 tablet 1   Cholecalciferol (VITAMIN D -3) 5000 units TABS Take 1 tablet by mouth daily.     Evolocumab  (REPATHA  SURECLICK) 140 MG/ML SOAJ Inject 140 mg into the skin every 14 (fourteen) days. Bill secondary to Medicare:  ID [id number],  BIN [id number],  PCN [id number],  Group [id number] 2 mL 3   lisinopril  (ZESTRIL ) 10 MG tablet Take 0.5 tablets (5 mg total) by mouth daily. 45 tablet 3   No current facility-administered medications for this visit.    PHYSICAL EXAMINATION: ECOG PERFORMANCE STATUS: 1 - Symptomatic but completely ambulatory VITALS: Vitals:   05/27/24 1125 05/27/24 1135  BP: (!) 152/72 (!) 142/79  Pulse: 65   Resp: 18   Temp: (!) 97.3 F (36.3 C)   SpO2: 98%    Filed Weights   05/27/24 1125  Weight: 177 lb 12.8 oz (80.6 kg)   Body mass index is 33.05 kg/m.  GENERAL: alert, in no acute distress and comfortable SKIN: no acute rashes, no significant lesions EYES: conjunctiva are pink and non-injected, sclera anicteric OROPHARYNX: MMM, no exudates, no oropharyngeal erythema or ulceration NECK: supple, no JVD LYMPH:  no palpable lymphadenopathy in the cervical, axillary or inguinal regions LUNGS: clear  to auscultation b/l with normal respiratory effort HEART: regular rate & rhythm ABDOMEN:  normoactive bowel sounds , non tender, not distended, no hepatosplenomegaly Extremity: no pedal edema PSYCH: alert & oriented x 3 with fluent speech NEURO: no focal motor/sensory deficits  LABORATORY DATA:   I have reviewed the data as listed     Latest Ref Rng & Units 05/27/2024   10:55 AM 02/17/2024   11:25 AM 01/23/2024    2:56 PM  CBC EXTENDED  WBC 4.0 - 10.5 K/uL 7.2  6.7  8.2   RBC 3.87 - 5.11 MIL/uL 4.89  4.44  4.67   Hemoglobin 12.0 - 15.0 g/dL 87.0  88.1  87.5   HCT 36.0 - 46.0 % 41.1  38.1  39.3   Platelets 150 - 400 K/uL 236  263  264.0   NEUT# 1.7 - 7.7 K/uL 2.8  3.2  3.2   Lymph# 0.7 - 4.0 K/uL 3.5  2.7  3.9    LDH:  Lab Results  Component Value Date   LDH 162 05/27/2024        Latest Ref Rng & Units 05/27/2024   10:55 AM 02/17/2024  11:25 AM 01/23/2024    2:56 PM  CMP  Glucose 70 - 99 mg/dL 95  92  85   BUN 8 - 23 mg/dL 21  23  24    Creatinine 0.44 - 1.00 mg/dL 8.87  8.81  8.73   Sodium 135 - 145 mmol/L 140  140  140   Potassium 3.5 - 5.1 mmol/L 4.2  4.2  4.1   Chloride 98 - 111 mmol/L 105  107  104   CO2 22 - 32 mmol/L 25  29  26    Calcium  8.9 - 10.3 mg/dL 9.8  9.7  9.7   Total Protein 6.5 - 8.1 g/dL 7.1  7.0  7.1   Total Bilirubin 0.0 - 1.2 mg/dL 0.3  0.4  0.4   Alkaline Phos 38 - 126 U/L 80  64  67   AST 15 - 41 U/L 16  10  13    ALT 0 - 44 U/L 10  11  10     09/22/18 Right Axillary LN Biopsy:      07/31/18 Molecular Pathology:        02/21/16 Biopsy:     RADIOGRAPHIC STUDIES: I have personally reviewed the radiological images as listed and agreed with the findings in the report. XR KNEE 3 VIEW RIGHT Result Date: 03/17/2024 X-rays demonstrate severe tricompartmental osteoarthritis.  Bone-on-bone joint space narrowing.  Kellgren-Lawrence stage IV    ASSESSMENT & PLAN:  78 y.o. female with  1. Chronic Lymphocytic Leukemia   02/21/16 Right Axillary  LN biopsy revealed SLL, the initial diagnosis. 07/31/18 FISH CLL Prognostic Panel revealed:52.0% cells with 11q deletion Labs upon initial presentation from 09/22/18, WBC at 16.8k, HGB at 10.9, PLT at 272k. ANC at 1.2k, Lymphs at 11.3k, Monocytes at 4.1k. 09/22/18 Right Axillary LN Biopsy revealed SLL, ruling out a transformation event   08/15/18 PET/CT revealed Bulky lymphadenopathy in the neck, chest, abdomen, and pelvis demonstrating low level hypermetabolism throughout. 9 mm right thyroid  nodule is hypermetabolic. Thyroid  ultrasound recommended to further evaluate. Mucosal thickening with air-fluid level in the left maxillary sinus. 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   08/12/2019 PET/CT scan ([id number]) revealed disease progression in neck, chest, abdomen, pelvis and interval development of pulmonary lesions   03/23/2020 CT C/A/P ([id number]) ([id number]) revealed. 1. Significant positive response to therapy. No new or progressive disease.   PLAN: - Discussed lab results on 05/27/2024 in detail with patient: CBC stable CMP with Creatinine 1.12 decreased from 1.18.   CKD stable. LDH 162.  -no evidence of CLL/SLL progression at this time. -continue Calquence  100mg  po daily - Answered questions about duration of Calquence  treatment.  Explained that this is used to prevent progression and is generally not discontinued unless patient stops tolerating it or her CLL/SLL progresses on it..   FOLLOW-UP in 3-4 months for labs and follow-up with Dr. Onesimo.  The total time spent in the appointment was 21 minutes* .  All of the patient's questions were answered and the patient knows to call the clinic with any problems, questions, or concerns.  Emaline Onesimo MD MS AAHIVMS [facility] Hematology/Oncology Physician [facility]  *Total Encounter Time as defined by the Centers for Medicare and Medicaid Services includes, in addition to the face-to-face time of a  patient visit (documented in the note above) non-face-to-face time: obtaining and reviewing outside history, ordering and reviewing medications, tests or procedures, care coordination (communications with other health care professionals or caregivers) and documentation in the medical record.  Erica Carlson  Lagle,acting as a neurosurgeon for Emaline Saran, MD.,have documented all relevant documentation on the behalf of Emaline Saran, MD,as directed by  Emaline Saran, MD while in the presence of Emaline Saran, MD.  I have reviewed the above documentation for accuracy and completeness, and I agree with the above.  Sherrill Buikema, MD

## 2024-05-28 ENCOUNTER — Other Ambulatory Visit (HOSPITAL_COMMUNITY): Payer: Self-pay

## 2024-06-02 ENCOUNTER — Other Ambulatory Visit (HOSPITAL_COMMUNITY): Payer: Self-pay

## 2024-06-02 DIAGNOSIS — M25561 Pain in right knee: Secondary | ICD-10-CM | POA: Diagnosis not present

## 2024-06-02 DIAGNOSIS — R262 Difficulty in walking, not elsewhere classified: Secondary | ICD-10-CM | POA: Diagnosis not present

## 2024-06-02 DIAGNOSIS — M25661 Stiffness of right knee, not elsewhere classified: Secondary | ICD-10-CM | POA: Diagnosis not present

## 2024-06-02 DIAGNOSIS — M1711 Unilateral primary osteoarthritis, right knee: Secondary | ICD-10-CM | POA: Diagnosis not present

## 2024-06-05 ENCOUNTER — Other Ambulatory Visit: Payer: Self-pay

## 2024-06-05 NOTE — Progress Notes (Signed)
 Specialty Pharmacy Refill Coordination Note  Erica Carlson is a 78 y.o. female contacted today regarding refills of specialty medication(s) Acalabrutinib  Maleate (Calquence )   Patient requested Delivery   Delivery date: 06/19/24   Verified address: [address]   [place] [address]   Medication will be filled on: 06/18/24

## 2024-06-18 ENCOUNTER — Other Ambulatory Visit: Payer: Self-pay

## 2024-07-08 ENCOUNTER — Other Ambulatory Visit: Payer: Self-pay

## 2024-07-13 ENCOUNTER — Other Ambulatory Visit (HOSPITAL_COMMUNITY): Payer: Self-pay

## 2024-07-27 ENCOUNTER — Other Ambulatory Visit (HOSPITAL_COMMUNITY): Payer: Self-pay

## 2024-07-29 ENCOUNTER — Other Ambulatory Visit: Payer: Self-pay

## 2024-07-30 ENCOUNTER — Other Ambulatory Visit: Payer: Self-pay | Admitting: Family Medicine

## 2024-07-30 ENCOUNTER — Other Ambulatory Visit: Payer: Self-pay

## 2024-08-04 ENCOUNTER — Other Ambulatory Visit: Payer: Self-pay

## 2024-08-04 ENCOUNTER — Other Ambulatory Visit: Payer: Self-pay | Admitting: Pharmacy Technician

## 2024-08-04 NOTE — Progress Notes (Signed)
 Specialty Pharmacy Refill Coordination Note  Erica Carlson is a 79 y.o. female contacted today regarding refills of specialty medication(s) Acalabrutinib  Maleate (Calquence )   Patient requested Delivery   Delivery date: 08/11/24   Verified address: [address]   [place] [address]   Medication will be filled on: 08/10/24

## 2024-08-05 ENCOUNTER — Other Ambulatory Visit: Payer: Self-pay

## 2024-08-05 NOTE — Progress Notes (Signed)
 Patient was contacted by the pharmacy regarding their specialty medication(s) Acalabrutinib  Maleate (Calquence )  to reschedule a later delivery date, due to impending winter weather conditions. Medication(s) will be filled 08/12/24 for a delivery by 08/13/24

## 2024-08-06 ENCOUNTER — Other Ambulatory Visit: Payer: Self-pay

## 2024-08-12 ENCOUNTER — Other Ambulatory Visit: Payer: Self-pay

## 2024-09-02 ENCOUNTER — Inpatient Hospital Stay: Admitting: Hematology

## 2024-09-02 ENCOUNTER — Inpatient Hospital Stay: Attending: Hematology

## 2024-10-12 ENCOUNTER — Ambulatory Visit
